# Patient Record
Sex: Male | Born: 1955 | Hispanic: Yes | Marital: Married | Smoking: Never smoker
Health system: Southern US, Community
[De-identification: ages and names within clinical notes are randomized; demographics above are authoritative.]

## PROBLEM LIST (undated history)

## (undated) DIAGNOSIS — I350 Nonrheumatic aortic (valve) stenosis: Secondary | ICD-10-CM

## (undated) DIAGNOSIS — E785 Hyperlipidemia, unspecified: Secondary | ICD-10-CM

## (undated) DIAGNOSIS — Z952 Presence of prosthetic heart valve: Secondary | ICD-10-CM

## (undated) DIAGNOSIS — B351 Tinea unguium: Secondary | ICD-10-CM

## (undated) DIAGNOSIS — I219 Acute myocardial infarction, unspecified: Secondary | ICD-10-CM

## (undated) DIAGNOSIS — M47816 Spondylosis without myelopathy or radiculopathy, lumbar region: Secondary | ICD-10-CM

## (undated) DIAGNOSIS — I6523 Occlusion and stenosis of bilateral carotid arteries: Secondary | ICD-10-CM

## (undated) DIAGNOSIS — M5412 Radiculopathy, cervical region: Secondary | ICD-10-CM

## (undated) DIAGNOSIS — I700 Atherosclerosis of aorta: Secondary | ICD-10-CM

## (undated) DIAGNOSIS — R7302 Impaired glucose tolerance (oral): Secondary | ICD-10-CM

## (undated) DIAGNOSIS — R519 Headache, unspecified: Secondary | ICD-10-CM

## (undated) DIAGNOSIS — M5416 Radiculopathy, lumbar region: Secondary | ICD-10-CM

## (undated) DIAGNOSIS — E669 Obesity, unspecified: Secondary | ICD-10-CM

## (undated) DIAGNOSIS — I10 Essential (primary) hypertension: Secondary | ICD-10-CM

## (undated) DIAGNOSIS — I2510 Atherosclerotic heart disease of native coronary artery without angina pectoris: Secondary | ICD-10-CM

## (undated) DIAGNOSIS — K219 Gastro-esophageal reflux disease without esophagitis: Secondary | ICD-10-CM

## (undated) DIAGNOSIS — I4510 Unspecified right bundle-branch block: Secondary | ICD-10-CM

## (undated) DIAGNOSIS — I509 Heart failure, unspecified: Secondary | ICD-10-CM

## (undated) DIAGNOSIS — Q2381 Bicuspid aortic valve: Secondary | ICD-10-CM

## (undated) DIAGNOSIS — Q230 Congenital stenosis of aortic valve: Secondary | ICD-10-CM

## (undated) HISTORY — DX: Obesity, unspecified: E66.9

## (undated) HISTORY — DX: Nonrheumatic aortic (valve) stenosis: I35.0

## (undated) HISTORY — DX: Essential (primary) hypertension: I10

## (undated) HISTORY — DX: Congenital stenosis of aortic valve: Q23.0

## (undated) HISTORY — DX: Bicuspid aortic valve: Q23.81

## (undated) HISTORY — DX: Impaired glucose tolerance (oral): R73.02

## (undated) HISTORY — DX: Headache, unspecified: R51.9

## (undated) HISTORY — DX: Atherosclerotic heart disease of native coronary artery without angina pectoris: I25.10

## (undated) HISTORY — DX: Hyperlipidemia, unspecified: E78.5

## (undated) HISTORY — PX: NO PAST SURGERIES: SHX2092

---

## 2006-06-19 ENCOUNTER — Inpatient Hospital Stay: Payer: Self-pay | Admitting: Internal Medicine

## 2006-06-19 ENCOUNTER — Other Ambulatory Visit: Payer: Self-pay

## 2006-06-20 ENCOUNTER — Other Ambulatory Visit: Payer: Self-pay

## 2006-06-21 ENCOUNTER — Ambulatory Visit: Payer: Self-pay | Admitting: Internal Medicine

## 2013-06-30 HISTORY — PX: CARDIAC CATHETERIZATION: SHX172

## 2013-07-01 ENCOUNTER — Inpatient Hospital Stay: Payer: Self-pay | Admitting: Internal Medicine

## 2013-07-01 LAB — BASIC METABOLIC PANEL
ANION GAP: 1 — AB (ref 7–16)
BUN: 12 mg/dL (ref 7–18)
CALCIUM: 8.6 mg/dL (ref 8.5–10.1)
CHLORIDE: 104 mmol/L (ref 98–107)
CREATININE: 0.94 mg/dL (ref 0.60–1.30)
Co2: 29 mmol/L (ref 21–32)
EGFR (African American): >60
GLUCOSE: 104 mg/dL — AB (ref 65–99)
Osmolality: 268 (ref 275–301)
POTASSIUM: 3.5 mmol/L (ref 3.5–5.1)
SODIUM: 134 mmol/L — AB (ref 136–145)

## 2013-07-01 LAB — TROPONIN I
Troponin-I: 0.96 ng/mL — ABNORMAL HIGH
Troponin-I: 1.7 ng/mL — ABNORMAL HIGH

## 2013-07-01 LAB — CBC
HCT: 47.8 % (ref 40.0–52.0)
HGB: 16.3 g/dL (ref 13.0–18.0)
MCH: 29.8 pg (ref 26.0–34.0)
MCHC: 34.1 g/dL (ref 32.0–36.0)
MCV: 87 fL (ref 80–100)
Platelet: 193 10*3/uL (ref 150–440)
RBC: 5.47 10*6/uL (ref 4.40–5.90)
RDW: 13.3 % (ref 11.5–14.5)
WBC: 8.4 10*3/uL (ref 3.8–10.6)

## 2013-07-01 LAB — CK-MB
CK-MB: 4.2 ng/mL — ABNORMAL HIGH (ref 0.5–3.6)
CK-MB: 4.6 ng/mL — ABNORMAL HIGH (ref 0.5–3.6)

## 2013-07-01 LAB — APTT: Activated PTT: 29.1 secs (ref 23.6–35.9)

## 2013-07-01 LAB — PROTIME-INR
INR: 0.9
PROTHROMBIN TIME: 12.4 s (ref 11.5–14.7)

## 2013-07-02 ENCOUNTER — Encounter: Payer: Self-pay | Admitting: Cardiovascular Disease

## 2013-07-02 ENCOUNTER — Other Ambulatory Visit: Payer: Self-pay | Admitting: Physician Assistant

## 2013-07-02 ENCOUNTER — Inpatient Hospital Stay (HOSPITAL_COMMUNITY)
Admission: AD | Admit: 2013-07-02 | Discharge: 2013-07-11 | DRG: 236 | Disposition: A | Discharge status: Home / Self Care | Payer: Medicaid Other | Source: Other Acute Inpatient Hospital | Attending: Cardiothoracic Surgery | Admitting: Cardiothoracic Surgery

## 2013-07-02 DIAGNOSIS — J988 Other specified respiratory disorders: Secondary | ICD-10-CM | POA: Diagnosis not present

## 2013-07-02 DIAGNOSIS — I498 Other specified cardiac arrhythmias: Secondary | ICD-10-CM | POA: Diagnosis not present

## 2013-07-02 DIAGNOSIS — I214 Non-ST elevation (NSTEMI) myocardial infarction: Secondary | ICD-10-CM

## 2013-07-02 DIAGNOSIS — Y849 Medical procedure, unspecified as the cause of abnormal reaction of the patient, or of later complication, without mention of misadventure at the time of the procedure: Secondary | ICD-10-CM | POA: Diagnosis not present

## 2013-07-02 DIAGNOSIS — J9819 Other pulmonary collapse: Secondary | ICD-10-CM | POA: Diagnosis not present

## 2013-07-02 DIAGNOSIS — I2510 Atherosclerotic heart disease of native coronary artery without angina pectoris: Secondary | ICD-10-CM | POA: Diagnosis present

## 2013-07-02 DIAGNOSIS — D62 Acute posthemorrhagic anemia: Secondary | ICD-10-CM | POA: Diagnosis not present

## 2013-07-02 DIAGNOSIS — I10 Essential (primary) hypertension: Secondary | ICD-10-CM | POA: Diagnosis present

## 2013-07-02 DIAGNOSIS — E8779 Other fluid overload: Secondary | ICD-10-CM | POA: Diagnosis not present

## 2013-07-02 DIAGNOSIS — Z951 Presence of aortocoronary bypass graft: Secondary | ICD-10-CM

## 2013-07-02 DIAGNOSIS — D689 Coagulation defect, unspecified: Secondary | ICD-10-CM | POA: Diagnosis not present

## 2013-07-02 DIAGNOSIS — I517 Cardiomegaly: Secondary | ICD-10-CM

## 2013-07-02 HISTORY — DX: Non-ST elevation (NSTEMI) myocardial infarction: I21.4

## 2013-07-02 LAB — LIPID PANEL
CHOLESTEROL: 215 mg/dL — AB (ref 0–200)
HDL: 38 mg/dL — AB (ref 40–60)
LDL CHOLESTEROL, CALC: 148 mg/dL — AB (ref 0–100)
TRIGLYCERIDES: 143 mg/dL (ref 0–200)
VLDL Cholesterol, Calc: 29 mg/dL (ref 5–40)

## 2013-07-02 LAB — HEMOGLOBIN A1C: Hemoglobin A1C: 6.1 % (ref 4.2–6.3)

## 2013-07-02 LAB — TROPONIN I: TROPONIN-I: 2.4 ng/mL — AB

## 2013-07-02 LAB — TSH: THYROID STIMULATING HORM: 3.44 u[IU]/mL

## 2013-07-02 LAB — APTT: Activated PTT: 74.5 secs — ABNORMAL HIGH (ref 23.6–35.9)

## 2013-07-02 LAB — MAGNESIUM: Magnesium: 2 mg/dL

## 2013-07-02 LAB — CK-MB: CK-MB: 5.2 ng/mL — AB (ref 0.5–3.6)

## 2013-07-02 MED ORDER — ASPIRIN EC 325 MG PO TBEC
325.0000 mg | DELAYED_RELEASE_TABLET | Freq: Every day | ORAL | Status: DC
  Administered 2013-07-03 – 2013-07-04 (×2): 325 mg via ORAL
  Filled 2013-07-02 (×4): qty 1

## 2013-07-02 MED ORDER — ONDANSETRON HCL 4 MG/2ML IJ SOLN: 4.0000 mg | Freq: Four times a day (QID) | INTRAMUSCULAR | Status: DC | PRN

## 2013-07-02 MED ORDER — ACETAMINOPHEN 325 MG PO TABS
650.0000 mg | ORAL_TABLET | ORAL | Status: DC | PRN
Start: 2013-07-02 — End: 2013-07-05
  Administered 2013-07-04 (×2): 650 mg via ORAL
  Filled 2013-07-02 (×2): qty 2

## 2013-07-02 MED ORDER — PANTOPRAZOLE SODIUM 40 MG PO TBEC
40.0000 mg | DELAYED_RELEASE_TABLET | Freq: Every day | ORAL | Status: DC
  Administered 2013-07-03 – 2013-07-05 (×3): 40 mg via ORAL
  Filled 2013-07-02 (×3): qty 1

## 2013-07-02 MED ORDER — MORPHINE SULFATE 2 MG/ML IJ SOLN: 2.0000 mg | INTRAMUSCULAR | Status: DC | PRN

## 2013-07-02 MED ORDER — NITROGLYCERIN 0.4 MG SL SUBL
0.4000 mg | SUBLINGUAL_TABLET | SUBLINGUAL | Status: DC | PRN
  Administered 2013-07-03 (×2): 0.4 mg via SUBLINGUAL
  Filled 2013-07-02: qty 25

## 2013-07-02 MED ORDER — METOPROLOL TARTRATE 25 MG PO TABS
25.0000 mg | ORAL_TABLET | Freq: Two times a day (BID) | ORAL | Status: DC
  Administered 2013-07-02 – 2013-07-04 (×5): 25 mg via ORAL
  Filled 2013-07-02 (×8): qty 1

## 2013-07-02 MED ORDER — HEPARIN BOLUS VIA INFUSION
4000.0000 [IU] | Freq: Once | INTRAVENOUS | Status: AC
  Administered 2013-07-02: 4000 [IU] via INTRAVENOUS
  Filled 2013-07-02: qty 4000

## 2013-07-02 MED ORDER — ZOLPIDEM TARTRATE 5 MG PO TABS: 5.0000 mg | ORAL_TABLET | Freq: Every evening | ORAL | Status: DC | PRN

## 2013-07-02 MED ORDER — LISINOPRIL 10 MG PO TABS
10.0000 mg | ORAL_TABLET | Freq: Every day | ORAL | Status: DC
  Administered 2013-07-03 – 2013-07-04 (×2): 10 mg via ORAL
  Filled 2013-07-02 (×4): qty 1

## 2013-07-02 MED ORDER — ALPRAZOLAM 0.25 MG PO TABS: 0.2500 mg | ORAL_TABLET | Freq: Two times a day (BID) | ORAL | Status: DC | PRN

## 2013-07-02 MED ORDER — HEPARIN (PORCINE) IN NACL 100-0.45 UNIT/ML-% IJ SOLN
1300.0000 [IU]/h | INTRAMUSCULAR | Status: DC
  Administered 2013-07-02: 1200 [IU]/h via INTRAVENOUS
  Administered 2013-07-03 – 2013-07-04 (×3): 1300 [IU]/h via INTRAVENOUS
  Filled 2013-07-02 (×5): qty 250

## 2013-07-02 MED ORDER — SODIUM CHLORIDE 0.9 % IJ SOLN: 3.0000 mL | INTRAMUSCULAR | Status: DC | PRN

## 2013-07-02 MED ORDER — SODIUM CHLORIDE 0.9 % IJ SOLN
3.0000 mL | Freq: Two times a day (BID) | INTRAMUSCULAR | Status: DC
Start: 2013-07-02 — End: 2013-07-05
  Administered 2013-07-03 – 2013-07-04 (×3): 3 mL via INTRAVENOUS

## 2013-07-02 MED ORDER — ATORVASTATIN CALCIUM 80 MG PO TABS
80.0000 mg | ORAL_TABLET | Freq: Every day | ORAL | Status: DC
  Administered 2013-07-03 – 2013-07-11 (×8): 80 mg via ORAL
  Filled 2013-07-02 (×9): qty 1

## 2013-07-02 MED ORDER — SODIUM CHLORIDE 0.9 % IV SOLN: 250.0000 mL | INTRAVENOUS | Status: DC | PRN

## 2013-07-02 NOTE — Progress Notes (Signed)
ANTICOAGULATION CONSULT NOTE - Initial Consult  Pharmacy Consult for Heparin  Indication: chest pain/ACS  No Known Allergies  Patient Measurements: Height: 5\' 6"  (167.6 cm) Weight: 199 lb 9.6 oz (90.538 kg) IBW/kg (Calculated) : 63.8 Heparin dosing weight = 90 kg  Vital Signs: Temp: 98.5 F (36.9 C) (02/03 1842) Temp src: Oral (02/03 1842) BP: 154/92 mmHg (02/03 1842) Pulse Rate: 85 (02/03 1842)  Labs: No results found for this basename: HGB, HCT, PLT, APTT, LABPROT, INR, HEPARINUNFRC, CREATININE, CKTOTAL, CKMB, TROPONINI,  in the last 72 hours  CrCl is unknown because no creatinine reading has been taken.   Medical History: No past medical history on file.  Medications:  Prescriptions prior to admission  Medication Sig Dispense Refill  . ibuprofen (ADVIL,MOTRIN) 100 MG tablet Take 200 mg by mouth every 6 (six) hours as needed for pain.         Assessment: 58 yo M admitted 07/02/2013  With several days of CP.  [facility] noted to have a positive troponin.  Pharmacy consulted to start heparin  Coag: ACS, patient denies recent bleeding or surgery.  Goal of Therapy:  Heparin level 0.3-0.7 units/ml Monitor platelets by anticoagulation protocol: Yes   Plan:  1. Heparin 4000 units IV x 1 then 1200 units/hr 2. Heparin level 6h after ggt starts 3. Daily heparin level and CBC   Thank you for allowing pharmacy to be a part of this patients care team.  Lovenia KimJulie Caydence Koenig Pharm.D., BCPS Clinical Pharmacist 07/02/2013 8:17 PM Pager: [phone] Phone: [phone]

## 2013-07-03 ENCOUNTER — Other Ambulatory Visit: Payer: Self-pay | Admitting: *Deleted

## 2013-07-03 ENCOUNTER — Inpatient Hospital Stay (HOSPITAL_COMMUNITY): Payer: Medicaid Other

## 2013-07-03 ENCOUNTER — Encounter (HOSPITAL_COMMUNITY): Payer: Self-pay | Admitting: *Deleted

## 2013-07-03 DIAGNOSIS — I2510 Atherosclerotic heart disease of native coronary artery without angina pectoris: Secondary | ICD-10-CM

## 2013-07-03 DIAGNOSIS — Z01810 Encounter for preprocedural cardiovascular examination: Secondary | ICD-10-CM

## 2013-07-03 LAB — BASIC METABOLIC PANEL
BUN: 12 mg/dL (ref 6–23)
CALCIUM: 8.8 mg/dL (ref 8.4–10.5)
CO2: 24 meq/L (ref 19–32)
Chloride: 100 mEq/L (ref 96–112)
Creatinine, Ser: 0.88 mg/dL (ref 0.50–1.35)
GFR calc non Af Amer: >90 mL/min (ref >90)
Glucose, Bld: 105 mg/dL — ABNORMAL HIGH (ref 70–99)
Potassium: 3.6 mEq/L — ABNORMAL LOW (ref 3.7–5.3)
Sodium: 139 mEq/L (ref 137–147)

## 2013-07-03 LAB — PULMONARY FUNCTION TEST
DL/VA % pred: 167 %
DL/VA: 7.32 ml/min/mmHg/L
DLCO cor % pred: 124 %
DLCO cor: 33.54 ml/min/mmHg
DLCO unc % pred: 131 %
DLCO unc: 35.37 ml/min/mmHg
FEF 25-75 Post: 2.37 L/sec
FEF 25-75 Pre: 2.01 L/sec
FEF2575-%Change-Post: 17 %
FEF2575-%Pred-Post: 86 %
FEF2575-%Pred-Pre: 72 %
FEV1-%Change-Post: 7 %
FEV1-%Pred-Post: 74 %
FEV1-%Pred-Pre: 69 %
FEV1-Post: 2.41 L
FEV1-Pre: 2.23 L
FEV1FVC-%Change-Post: 0 %
FEV1FVC-%Pred-Pre: 100 %
FEV6-%Change-Post: 7 %
FEV6-%Pred-Post: 74 %
FEV6-%Pred-Pre: 69 %
FEV6-Post: 3 L
FEV6-Pre: 2.79 L
FEV6FVC-%Change-Post: 0 %
FEV6FVC-%Pred-Post: 105 %
FEV6FVC-%Pred-Pre: 104 %
FVC-%Change-Post: 6 %
FVC-%Pred-Post: 74 %
FVC-%Pred-Pre: 69 %
FVC-Post: 3.12 L
FVC-Pre: 2.92 L
Post FEV1/FVC ratio: 77 %
Post FEV6/FVC ratio: 100 %
Pre FEV1/FVC ratio: 76 %
Pre FEV6/FVC Ratio: 100 %
RV % pred: 73 %
RV: 1.44 L
TLC % pred: 76 %
TLC: 4.73 L

## 2013-07-03 LAB — HEPARIN LEVEL (UNFRACTIONATED)
Heparin Unfractionated: 0.39 IU/mL (ref 0.30–0.70)
Heparin Unfractionated: 0.28 IU/mL — ABNORMAL LOW (ref 0.30–0.70)
Heparin Unfractionated: 0.38 IU/mL (ref 0.30–0.70)

## 2013-07-03 LAB — URINALYSIS, ROUTINE W REFLEX MICROSCOPIC
Bilirubin Urine: NEGATIVE
Glucose, UA: NEGATIVE mg/dL
Hgb urine dipstick: NEGATIVE
Ketones, ur: NEGATIVE mg/dL
Leukocytes, UA: NEGATIVE
Nitrite: NEGATIVE
Protein, ur: NEGATIVE mg/dL
Specific Gravity, Urine: 1.021 (ref 1.005–1.030)
Urobilinogen, UA: 0.2 mg/dL (ref 0.0–1.0)
pH: 6 (ref 5.0–8.0)

## 2013-07-03 LAB — CBC
HEMATOCRIT: 47.1 % (ref 39.0–52.0)
Hemoglobin: 16.7 g/dL (ref 13.0–17.0)
MCH: 30.6 pg (ref 26.0–34.0)
MCHC: 35.5 g/dL (ref 30.0–36.0)
MCV: 86.4 fL (ref 78.0–100.0)
Platelets: 199 10*3/uL (ref 150–400)
RBC: 5.45 MIL/uL (ref 4.22–5.81)
RDW: 13.4 % (ref 11.5–15.5)
WBC: 8.9 10*3/uL (ref 4.0–10.5)

## 2013-07-03 LAB — SURGICAL PCR SCREEN
MRSA, PCR: NEGATIVE
Staphylococcus aureus: NEGATIVE

## 2013-07-03 LAB — APTT: aPTT: 58 seconds — ABNORMAL HIGH (ref 24–37)

## 2013-07-03 LAB — PLATELET INHIBITION P2Y12
PLATELET FUNCTION P2Y12: UNDETERMINED [PRU] (ref 194–418)
PLATELET FUNCTION P2Y12: 143 [PRU] — AB (ref 194–418)

## 2013-07-03 MED ORDER — ALBUTEROL SULFATE (2.5 MG/3ML) 0.083% IN NEBU
2.5000 mg | INHALATION_SOLUTION | Freq: Once | RESPIRATORY_TRACT | Status: AC
  Administered 2013-07-03: 2.5 mg via RESPIRATORY_TRACT

## 2013-07-03 NOTE — Progress Notes (Signed)
Utilization Review Completed.James Yates T2/08/2013  

## 2013-07-03 NOTE — Progress Notes (Signed)
Pre-op Cardiac Surgery  Carotid Findings:  Bilateral:  1-39% ICA stenosis.  Vertebral artery flow is antegrade.      Upper Extremity Right Left  Brachial Pressures 129 117  Radial Waveforms Bi Tri  Ulnar Waveforms Bi Tri  Palmar Arch (Allen's Test) Decreases with radial compression, but not >50%. Normal with ulnar compression Normal    Farrel DemarkJill Eunice, RDMS, RVT 07/03/2013

## 2013-07-03 NOTE — Consult Note (Signed)
[address]       [place] [address]             [phone]        James Yates [facility] Medical Record #[id number] Date of Birth: [birth year]  Referring: Kirke Corin Primary Care: No PCP Per Patient  Chief Complaint: NSTEMI, 3V CAD   History of Present Illness:      James Yates is a 58 yo Hispanic male who primarily speaks Spanish.  He presented to [facility] with complaints of intermittent dull chest pain that has been occurring over the past month.  However, over the past week the patient noticed the pain had become exertional with radiation into his back and neck with increasing severity.  He denies associated dyspnea, diaphoresis, and nausea.  Workup in the ED revealed no significant EKG changes and an elevated Troponin.  He was ruled in for NSTEMI, started on a Heparin drip, administered ASA and 300 mg Plavix.  Cardiology admitted patient chest pain free.  He underwent Cardiac catheterization which revealed 3 vessel CAD with no LM involvement as well as a preserved EF of 60%.  Echocardiogram was also obtained which did not reveal evidence of significant valvular disease.  It was felt patient wound benefit from Coronary Bypass procedure, resulting in transfer to [facility] for [facility] care.  The patient is currently chest pain free.  He remains on a Heparin drip.    Current Activity/ Functional Status: Patient is independent with mobility/ambulation, transfers, ADL's, IADL's.   Zubrod Score: At the time of surgery this patient's most appropriate activity status/level should be described as: [x]     0    Normal activity, no symptoms []     1    Restricted in physical strenuous activity but ambulatory, able to do out light work []     2    Ambulatory and capable of self care, unable to do work activities, up and about                 more than 50%  Of the time                            []     3    Only limited self care, in bed greater than 50% of waking  hours []     4    Completely disabled, no self care, confined to bed or chair []     5    Moribund  Past Medical History  Diagnosis Date  . Medical history non-contributory     Past Surgical History  Procedure Laterality Date  . No past surgeries      History  Smoking status  . Never Smoker   Smokeless tobacco  . Not on file    History  Alcohol Use No    History   Social History  . Marital Status: Married    Spouse Name: N/A    Number of Children: N/A  . Years of Education: N/A   Occupational History  . Not on file.   Social History Main Topics  . Smoking status: Never Smoker   . Smokeless tobacco: Not on file  . Alcohol Use: No  . Drug Use: No  . Sexual Activity: Not on file   Other Topics Concern  . Not on file   Social History Narrative  . No narrative on file    No Known Allergies  Current Facility-Administered Medications  Medication Dose Route Frequency Provider Last Rate Last Dose  . 0.9 %  sodium chloride infusion  250 mL Intravenous PRN Roger A Arguello, PA-C      . acetaminophen (TYLENOL) tablet 650 mg  650 mg Oral Q4H PRN Roger A Arguello, PA-C      . ALPRAZolam Prudy Feeler) tablet 0.25 mg  0.25 mg Oral BID PRN Gery Pray, PA-C      . aspirin EC tablet 325 mg  325 mg Oral Daily Roger A Arguello, PA-C   325 mg at 07/03/13 1022  . atorvastatin (LIPITOR) tablet 80 mg  80 mg Oral q1800 Roger A Arguello, PA-C      . heparin ADULT infusion 100 units/mL (25000 units/250 mL)  1,200 Units/hr Intravenous Continuous Lewayne Bunting, MD 12 mL/hr at 07/02/13 2150 1,200 Units/hr at 07/02/13 2150  . lisinopril (PRINIVIL,ZESTRIL) tablet 10 mg  10 mg Oral Daily Roger A Arguello, PA-C   10 mg at 07/03/13 1022  . metoprolol tartrate (LOPRESSOR) tablet 25 mg  25 mg Oral BID Roger A Arguello, PA-C   25 mg at 07/03/13 1022  . morphine 2 MG/ML injection 2 mg  2 mg Intravenous Q4H PRN Roger A Arguello, PA-C      . nitroGLYCERIN (NITROSTAT) SL tablet 0.4 mg  0.4 mg  Sublingual Q5 Min x 3 PRN Roger A Arguello, PA-C      . ondansetron (ZOFRAN) injection 4 mg  4 mg Intravenous Q6H PRN Roger A Arguello, PA-C      . pantoprazole (PROTONIX) EC tablet 40 mg  40 mg Oral Q0600 Roger A Arguello, PA-C   40 mg at 07/03/13 0550  . sodium chloride 0.9 % injection 3 mL  3 mL Intravenous Q12H Roger A Arguello, PA-C      . sodium chloride 0.9 % injection 3 mL  3 mL Intravenous PRN Roger A Arguello, PA-C      . zolpidem (AMBIEN) tablet 5 mg  5 mg Oral QHS PRN,MR X 1 Roger A Arguello, PA-C        Prescriptions prior to admission  Medication Sig Dispense Refill  . ibuprofen (ADVIL,MOTRIN) 100 MG tablet Take 200 mg by mouth every 6 (six) hours as needed for pain.         No family history on file.   Review of Systems:     Cardiac Review of Systems: Y or N  Chest Pain [ Y, on presentation, currently resolved   ]  Resting SOB [   ] Exertional SOB  [  ]  Orthopnea [  ]   Pedal Edema [   ]    Palpitations [  ] Syncope  [  ]   Presyncope [   ]  General Review of Systems: [Y] = yes [  ]=no Constitional: recent weight change Klaus.Mock  ]; anorexia [  ]; fatigue [  ]; nausea [ N ]; night sweats [  ]; fever [  ]; or chills [  ]                                                               Dental: poor dentition[  ]; Last Dentist visit:   Eye : blurred vision [  ]; diplopia [ N  ];  vision changes [  ];  Amaurosis fugax[  ]; Resp: cough [Y  ];  wheezing[  ];  hemoptysis[ N ]; shortness of breath[  ]; paroxysmal nocturnal dyspnea[  ]; dyspnea on exertion[  ]; or orthopnea[  ];  GI:  gallstones[  ], vomiting[ N ];  dysphagia[  ]; melena[  ];  hematochezia [  ]; heartburn[  ];   Hx of  Colonoscopy[  ]; GU: kidney stones [  ]; hematuria[  ];   dysuria [  ];  nocturia[  ];  history of     obstruction [  ]; urinary frequency [  ]             Skin: rash, swelling[ N ];, hair loss[  ];  peripheral edema[  ];  or itching[  ]; Musculosketetal: myalgias[  ];  joint swelling[  ];  joint erythema[   ];  joint pain[  ];  back pain[  ];  Heme/Lymph: bruising[  ];  bleeding[  ];  anemia[  ];  Neuro: TIA[ N ];  headaches[  ];  stroke[  ];  vertigo[  ];  seizures[  ];   paresthesias[ n ];  difficulty walking[  ];  Psych:depression[  ]; anxiety[  ];  Endocrine: diabetes[ N ];  thyroid dysfunction[  ];  Immunizations: Flu [  ]; Pneumococcal[  ];  Other:  Physical Exam: BP 134/87  Pulse 81  Temp(Src) 98.8 F (37.1 C) (Oral)  Resp 20  Ht 5\' 6"  (1.676 m)  Wt 206 lb (93.441 kg)  BMI 33.27 kg/m2  SpO2 99%  General appearance: alert, cooperative and no distress Neurologic: intact Heart: regular rate and rhythm Lungs: clear to auscultation bilaterally Abdomen: soft, non-tender; bowel sounds normal; no masses,  no organomegaly Extremities: extremities normal, atraumatic, no cyanosis or edema  Diagnostic Studies & Laboratory data:     Recent Radiology Findings:    Recent Lab Findings: Lab Results  Component Value Date   WBC 8.9 07/03/2013   HGB 16.7 07/03/2013   HCT 47.1 07/03/2013   PLT 199 07/03/2013   GLUCOSE 105* 07/03/2013   NA 139 07/03/2013   K 3.6* 07/03/2013   CL 100 07/03/2013   CREATININE 0.88 07/03/2013   BUN 12 07/03/2013   CO2 24 07/03/2013   Assessment / Plan:    1. Coronary Artery Disease- CABG consult requested 2. Recent NSTEMI- on Beta Blocker, ASA, ACE, and Statin 3. Dispo- Dr. Donata ClayVan Trigt is aware of patient.  He did receive Plavix, which will affect timing of surgery.  He will evaluate patient and discuss timing of procedure.   07/03/2013 12:07 PM    Coronary angiogram, 2-D echocardiogram reviewed in patient examined. Plan multivessel CABG for severe multivessel CAD and unstable angina when Plavix effect resolves by antiplatelet assay P2 Y. 12. Surgery tentatively scheduled for February 6.

## 2013-07-03 NOTE — Progress Notes (Signed)
ANTICOAGULATION CONSULT NOTE - Follow Up Consult  Pharmacy Consult for heparin Indication: chest pain/ACS  Labs:  Recent Labs  07/03/13 0300  HGB 16.7  HCT 47.1  PLT 199  HEPARINUNFRC 0.39  CREATININE 0.88    Assessment/Plan:  58yo male therapeutic on heparin with initial dosing for CP.  Will continue gtt at current rate and confirm stable with additional level.  Vernard GamblesVeronda Sherita Decoste, PharmD, BCPS  07/03/2013,6:06 AM

## 2013-07-03 NOTE — Plan of Care (Signed)
Problem: Phase I - Pre-Op Goal: Point person for discharge identified Outcome: Completed/Met Date Met:  07/03/13 Wife and daughter

## 2013-07-03 NOTE — Progress Notes (Signed)
ANTICOAGULATION CONSULT NOTE - Follow Up Consult  Pharmacy Consult for heparin Indication: CAD awaiting CABG  No Known Allergies  Patient Measurements: Height: 5\' 6"  (167.6 cm) Weight: 206 lb (93.441 kg) IBW/kg (Calculated) : 63.8 Heparin Dosing Weight: 90 kg  Vital Signs: Temp: 98.8 F (37.1 C) (02/04 0434) Temp src: Oral (02/04 0434) BP: 134/87 mmHg (02/04 0434) Pulse Rate: 81 (02/04 0434)  Labs:  Recent Labs  07/03/13 0300 07/03/13 1140  HGB 16.7  --   HCT 47.1  --   PLT 199  --   HEPARINUNFRC 0.39 0.28*  CREATININE 0.88  --     Estimated Creatinine Clearance: 99 ml/min (by C-G formula based on Cr of 0.88).  Assessment: Patient is a 58 y.o M on heparin for CAD with plan for CABG.  Repeat heparin level now back slightly below goal at 0.28.  No bleeding documented.  Goal of Therapy:  Heparin level 0.3-0.7 units/ml Monitor platelets by anticoagulation protocol: Yes   Plan:  1) increase heparin drip to 1300 units/hr 2) recheck another 6 hour heparin level  Ledon Weihe P 07/03/2013,12:51 PM

## 2013-07-03 NOTE — Progress Notes (Signed)
  SUBJECTIVE:  No chest pain or SOB currently  OBJECTIVE:   Vitals:   Filed Vitals:   07/02/13 2128 07/02/13 2339 07/03/13 0434 07/03/13 1025  BP: 157/90 122/82 134/87   Pulse: 78 78 81   Temp: 98.2 F (36.8 C)  98.8 F (37.1 C)   TempSrc: Oral  Oral   Resp: 18  20   Height:    5\' 6"  (1.676 m)  Weight:   200 lb 9.6 oz (90.992 kg) 206 lb (93.441 kg)  SpO2: 99%  99%    I&O's:   Intake/Output Summary (Last 24 hours) at 07/03/13 1159 Last data filed at 07/03/13 0800  Gross per 24 hour  Intake    264 ml  Output      0 ml  Net    264 ml   TELEMETRY: Reviewed telemetry pt in NSR:     PHYSICAL EXAM General: Well developed, well nourished, in no acute distress Head: Eyes PERRLA, No xanthomas.   Normal cephalic and atramatic  Lungs:   Clear bilaterally to auscultation and percussion. Heart:   HRRR S1 S2 Pulses are 2+ & equal.            No carotid bruit. No JVD.  No abdominal bruits. No femoral bruits. Abdomen: Bowel sounds are positive, abdomen soft and non-tender without masses  Extremities:   No clubbing, cyanosis or edema.  DP +1 Neuro: Alert and oriented X 3. Psych:  Good affect, responds appropriately   LABS: Basic Metabolic Panel:  Recent Labs  16/02/9601/04/15 0300  NA 139  K 3.6*  CL 100  CO2 24  GLUCOSE 105*  BUN 12  CREATININE 0.88  CALCIUM 8.8   Liver Function Tests: No results found for this basename: AST, ALT, ALKPHOS, BILITOT, PROT, ALBUMIN,  in the last 72 hours No results found for this basename: LIPASE, AMYLASE,  in the last 72 hours CBC:  Recent Labs  07/03/13 0300  WBC 8.9  HGB 16.7  HCT 47.1  MCV 86.4  PLT 199     RADIOLOGY: No results found.    ASSESSMENT:  1.  NSTEMI 2.  Severe 3 vessel ASCAD 3.  Normal LVF by echo   PLAN:   1.  Continue ASA/IV Heparin/beta blocker/statin/ACE I 2.  He did receive Plavix at [facility] x 2 dose on 2/2 in the evening 3.  CVTS consult for CABG  Quintella ReichertURNER,TRACI R, MD  07/03/2013  11:59 AM

## 2013-07-03 NOTE — Progress Notes (Signed)
ANTICOAGULATION CONSULT NOTE - Follow Up Consult  Pharmacy Consult for Heparin Indication: CAD awaiting CABG   No Known Allergies  Patient Measurements: Height: 5\' 6"  (167.6 cm) Weight: 206 lb (93.441 kg) IBW/kg (Calculated) : 63.8 Heparin Dosing Weight: 90kg  Vital Signs: Temp: 99 F (37.2 C) (02/04 2100) Temp src: Oral (02/04 2100) BP: 109/69 mmHg (02/04 2100) Pulse Rate: 89 (02/04 2100)  Labs:  Recent Labs  07/03/13 0300 07/03/13 1140 07/03/13 1310 07/03/13 1859  HGB 16.7  --   --   --   HCT 47.1  --   --   --   PLT 199  --   --   --   APTT  --   --  58*  --   HEPARINUNFRC 0.39 0.28*  --  0.38  CREATININE 0.88  --   --   --     Estimated Creatinine Clearance: 99 ml/min (by C-G formula based on Cr of 0.88).   Medications:  Heparin 1300 units/hr  Assessment: 58yom continuing on heparin for CAD while awaiting CABG. Heparin level (0.38) is now therapeutic after rate increase - will continue current rate and follow-up AM level. - No significant bleeding reproted  Goal of Therapy:  Heparin level 0.3-0.7 units/ml Monitor platelets by anticoagulation protocol: Yes   Plan:  1. Continue heparin drip 1300 units/hr (13 ml/hr) 2. Follow-up AM heparin level and CBC  James Yates, James Yates [phone] 07/03/2013,9:22 PM

## 2013-07-03 NOTE — Progress Notes (Signed)
Pt complaining of chest pressure 3 out of 10. RN gave pt sublingual nitroglycerin X's 2 and pt now states pain is at a level 0. NT got updated vitals and EKG, vitals are WDL. Will continue to monitor.   Genevive Biavis, Zaliyah Meikle R, RN

## 2013-07-04 DIAGNOSIS — I2510 Atherosclerotic heart disease of native coronary artery without angina pectoris: Secondary | ICD-10-CM

## 2013-07-04 LAB — HEMOGLOBIN A1C
Hgb A1c MFr Bld: 6.4 % — ABNORMAL HIGH (ref <5.7)
Mean Plasma Glucose: 137 mg/dL — ABNORMAL HIGH (ref <117)

## 2013-07-04 LAB — HEPATIC FUNCTION PANEL
ALT: 17 U/L (ref 0–53)
AST: 16 U/L (ref 0–37)
Albumin: 3.9 g/dL (ref 3.5–5.2)
Alkaline Phosphatase: 78 U/L (ref 39–117)
Bilirubin, Direct: <0.2 mg/dL (ref 0.0–0.3)
Total Bilirubin: 0.7 mg/dL (ref 0.3–1.2)
Total Protein: 7.5 g/dL (ref 6.0–8.3)

## 2013-07-04 LAB — HEPARIN LEVEL (UNFRACTIONATED): Heparin Unfractionated: 0.42 IU/mL (ref 0.30–0.70)

## 2013-07-04 LAB — CBC
HCT: 46.7 % (ref 39.0–52.0)
HEMOGLOBIN: 16.4 g/dL (ref 13.0–17.0)
MCH: 30.5 pg (ref 26.0–34.0)
MCHC: 35.1 g/dL (ref 30.0–36.0)
MCV: 87 fL (ref 78.0–100.0)
Platelets: 198 10*3/uL (ref 150–400)
RBC: 5.37 MIL/uL (ref 4.22–5.81)
RDW: 13.5 % (ref 11.5–15.5)
WBC: 8.1 10*3/uL (ref 4.0–10.5)

## 2013-07-04 LAB — PLATELET INHIBITION P2Y12: Platelet Function  P2Y12: 183 [PRU] — ABNORMAL LOW (ref 194–418)

## 2013-07-04 LAB — PROTIME-INR
INR: 0.94 (ref 0.00–1.49)
Prothrombin Time: 12.4 seconds (ref 11.6–15.2)

## 2013-07-04 LAB — TSH: TSH: 5.255 u[IU]/mL — ABNORMAL HIGH (ref 0.350–4.500)

## 2013-07-04 MED ORDER — PLASMA-LYTE 148 IV SOLN
INTRAVENOUS | Status: AC
  Administered 2013-07-05: 09:00:00
  Filled 2013-07-04: qty 2.5

## 2013-07-04 MED ORDER — VANCOMYCIN HCL 10 G IV SOLR
1250.0000 mg | INTRAVENOUS | Status: AC
  Administered 2013-07-05: 1250 mg via INTRAVENOUS
  Filled 2013-07-04: qty 1250

## 2013-07-04 MED ORDER — EPINEPHRINE HCL 1 MG/ML IJ SOLN
0.5000 ug/min | INTRAVENOUS | Status: DC
  Filled 2013-07-04: qty 4

## 2013-07-04 MED ORDER — NITROGLYCERIN IN D5W 200-5 MCG/ML-% IV SOLN
2.0000 ug/min | INTRAVENOUS | Status: DC
  Filled 2013-07-04: qty 250

## 2013-07-04 MED ORDER — DEXMEDETOMIDINE HCL IN NACL 400 MCG/100ML IV SOLN
0.1000 ug/kg/h | INTRAVENOUS | Status: AC
  Administered 2013-07-05: 0.2 ug/kg/h via INTRAVENOUS
  Filled 2013-07-04: qty 100

## 2013-07-04 MED ORDER — CEFUROXIME SODIUM 750 MG IJ SOLR
750.0000 mg | INTRAMUSCULAR | Status: DC
Start: 2013-07-05 — End: 2013-07-05
  Filled 2013-07-04: qty 750

## 2013-07-04 MED ORDER — MAGNESIUM SULFATE 50 % IJ SOLN
40.0000 meq | INTRAMUSCULAR | Status: DC
  Filled 2013-07-04: qty 10

## 2013-07-04 MED ORDER — DOPAMINE-DEXTROSE 3.2-5 MG/ML-% IV SOLN
2.0000 ug/kg/min | INTRAVENOUS | Status: DC
  Filled 2013-07-04: qty 250

## 2013-07-04 MED ORDER — POTASSIUM CHLORIDE 2 MEQ/ML IV SOLN
80.0000 meq | INTRAVENOUS | Status: DC
Start: 2013-07-05 — End: 2013-07-05
  Filled 2013-07-04: qty 40

## 2013-07-04 MED ORDER — SODIUM CHLORIDE 0.9 % IV SOLN
INTRAVENOUS | Status: AC
  Administered 2013-07-05: 69.8 mL/h via INTRAVENOUS
  Filled 2013-07-04: qty 40

## 2013-07-04 MED ORDER — SODIUM CHLORIDE 0.9 % IV SOLN
INTRAVENOUS | Status: AC
  Administered 2013-07-05: 1.5 [IU]/h via INTRAVENOUS
  Filled 2013-07-04: qty 1

## 2013-07-04 MED ORDER — CEFUROXIME SODIUM 1.5 G IJ SOLR
1.5000 g | INTRAMUSCULAR | Status: AC
  Administered 2013-07-05: .75 g via INTRAVENOUS
  Administered 2013-07-05: 1.5 g via INTRAVENOUS
  Filled 2013-07-04: qty 1.5

## 2013-07-04 MED ORDER — PHENYLEPHRINE HCL 10 MG/ML IJ SOLN
30.0000 ug/min | INTRAVENOUS | Status: AC
  Administered 2013-07-05: 50 ug/min via INTRAVENOUS
  Filled 2013-07-04: qty 2

## 2013-07-04 MED ORDER — SODIUM CHLORIDE 0.9 % IV SOLN
INTRAVENOUS | Status: DC
  Filled 2013-07-04: qty 30

## 2013-07-04 NOTE — Care Management Note (Unsigned)
    Page 1 of 1   07/04/2013     3:45:52 PM   CARE MANAGEMENT NOTE 07/04/2013  Patient:  James Yates,James Yates   Account Number:  [id number]  Date Initiated:  07/04/2013  Documentation initiated by:  Taj Nevins  Subjective/Objective Assessment:   PT ADM ON 07/02/13 WITH CHEST PAIN, 3 VESSEL CAD.  PTA, PT INDEPENDENT, LIVES WITH FAMILY.     Action/Plan:   PT FOR CABG 07/05/13.  WILL FOLLOW FOR DC NEEDS AS PT PROGRESSES.   Anticipated DC Date:  07/10/2013   Anticipated DC Plan:  HOME W HOME HEALTH SERVICES      DC Planning Services  CM consult      Choice offered to / List presented to:             Status of service:  In process, will continue to follow Medicare Important Message given?   (If response is "NO", the following Medicare IM given date fields will be blank) Date Medicare IM given:   Date Additional Medicare IM given:    Discharge Disposition:    Per UR Regulation:  Reviewed for med. necessity/level of care/duration of stay  If discussed at Long Length of Stay Meetings, dates discussed:    Comments:

## 2013-07-04 NOTE — Progress Notes (Signed)
SUBJECTIVE:  Had some CP yesterday evening.  Currently pain free  OBJECTIVE:   Vitals:   Filed Vitals:   07/03/13 1408 07/03/13 2100 07/04/13 0500 07/04/13 0607  BP: 149/95 109/69  142/88  Pulse: 81 89  76  Temp: 98.6 F (37 C) 99 F (37.2 C)  98.9 F (37.2 C)  TempSrc: Oral Oral  Oral  Resp: 18 18  18   Height:      Weight:   199 lb 9.6 oz (90.538 kg)   SpO2: 94% 94%  97%   I&O's:   Intake/Output Summary (Last 24 hours) at 07/04/13 0981 Last data filed at 07/03/13 1600  Gross per 24 hour  Intake    351 ml  Output      0 ml  Net    351 ml   TELEMETRY: Reviewed telemetry pt in NSR:     PHYSICAL EXAM General: Well developed, well nourished, in no acute distress Head: Eyes PERRLA, No xanthomas.   Normal cephalic and atramatic  Lungs:   Clear bilaterally to auscultation and percussion. Heart:   HRRR S1 S2 Pulses are 2+ & equal. Abdomen: Bowel sounds are positive, abdomen soft and non-tender without masses  Extremities:   No clubbing, cyanosis or edema.  DP +1 Neuro: Alert and oriented X 3. Psych:  Good affect, responds appropriately   LABS: Basic Metabolic Panel:  Recent Labs  19/14/78 0300  NA 139  K 3.6*  CL 100  CO2 24  GLUCOSE 105*  BUN 12  CREATININE 0.88  CALCIUM 8.8   Liver Function Tests: No results found for this basename: AST, ALT, ALKPHOS, BILITOT, PROT, ALBUMIN,  in the last 72 hours No results found for this basename: LIPASE, AMYLASE,  in the last 72 hours CBC:  Recent Labs  07/03/13 0300 07/04/13 0600  WBC 8.9 8.1  HGB 16.7 16.4  HCT 47.1 46.7  MCV 86.4 87.0  PLT 199 198   Cardiac Enzymes: No results found for this basename: CKTOTAL, CKMB, CKMBINDEX, TROPONINI,  in the last 72 hours BNP: No components found with this basename: POCBNP,  D-Dimer: No results found for this basename: DDIMER,  in the last 72 hours Hemoglobin A1C: No results found for this basename: HGBA1C,  in the last 72 hours Fasting Lipid Panel: No results  found for this basename: CHOL, HDL, LDLCALC, TRIG, CHOLHDL, LDLDIRECT,  in the last 72 hours Thyroid Function Tests: No results found for this basename: TSH, T4TOTAL, FREET3, T3FREE, THYROIDAB,  in the last 72 hours Anemia Panel: No results found for this basename: VITAMINB12, FOLATE, FERRITIN, TIBC, IRON, RETICCTPCT,  in the last 72 hours Coag Panel:   Lab Results  Component Value Date   INR 0.94 07/04/2013    RADIOLOGY: Dg Chest 2 View  07/03/2013   CLINICAL DATA:  Coronary artery disease.  Preop CABG.  EXAM: CHEST  2 VIEW  COMPARISON:  DG CHEST 2V dated 07/01/2013  FINDINGS: Mediastinum and hilar structures normal. Borderline cardiomegaly, no congestive heart failure. Poor inspiration with mild basilar atelectasis. No significant pleural effusion or pneumothorax. Minimal pleural thickening noted bilaterally most consistent with scarring. No acute bony abnormality. Degenerative changes thoracic spine. Minimal stable wedging of lower thoracic vertebral bodies.  IMPRESSION: 1. Poor inspiration with mild bibasilar atelectasis. 2. Borderline cardiomegaly, no CHF.   Electronically Signed   By: Maisie Fus  Register   On: 07/03/2013 13:48   ASSESSMENT:  1. NSTEMI  2. Severe 3 vessel ASCAD  3. Normal LVF by echo  PLAN:  1. Continue ASA/IV Heparin/beta blocker/statin/ACE I  2. He did [facility] x 2 dose on 2/2 in the evening  3. CABG tentatively planned for tomorrow pending results of P2Y12 assay      Quintella ReichertURNER,James Platten R, MD  07/04/2013  7:14 AM

## 2013-07-04 NOTE — Progress Notes (Signed)
ANTICOAGULATION CONSULT NOTE - Follow Up Consult  Pharmacy Consult for Heparin Indication: CAD awaiting CABG   No Known Allergies  Patient Measurements: Height: 5\' 6"  (167.6 cm) Weight: 199 lb 9.6 oz (90.538 kg) IBW/kg (Calculated) : 63.8 Heparin Dosing Weight: 90kg  Vital Signs: Temp: 98.9 F (37.2 C) (02/05 0607) Temp src: Oral (02/05 0607) BP: 142/88 mmHg (02/05 0607) Pulse Rate: 76 (02/05 0607)  Labs:  Recent Labs  07/03/13 0300 07/03/13 1140 07/03/13 1310 07/03/13 1859 07/04/13 0600  HGB 16.7  --   --   --  16.4  HCT 47.1  --   --   --  46.7  PLT 199  --   --   --  198  APTT  --   --  58*  --   --   LABPROT  --   --   --   --  12.4  INR  --   --   --   --  0.94  HEPARINUNFRC 0.39 0.28*  --  0.38 0.42  CREATININE 0.88  --   --   --   --     Estimated Creatinine Clearance: 97.6 ml/min (by C-G formula based on Cr of 0.88).   Medications:  Heparin 1300 units/hr  Assessment: 58yom continuing on heparin for CAD while awaiting CABG. Heparin level (0.42) is therapeutic on 1300 units/hr.    Goal of Therapy:  Heparin level 0.3-0.7 units/ml Monitor platelets by anticoagulation protocol: Yes   Plan:  1. Continue heparin drip 1300 units/hr (13 ml/hr) 2. Follow-up AM heparin level and CBC 3. Follow-up timing of CABG  Toys 'R' UsKimberly Elouise Yates, Pharm.D., BCPS Clinical Pharmacist Pager [phone] 07/04/2013 12:45 PM

## 2013-07-04 NOTE — Progress Notes (Signed)
CARDIAC REHAB PHASE I   PRE:  Rate/Rhythm: 100 ST    BP: sitting 130/90    SaO2:   MODE:  Ambulation: 500 ft   POST:  Rate/Rhythm: 107 ST    BP: sitting 144/90     SaO2:   Ed completed through [place], Nurse, learning disabilitytranslator. Good understanding. Pt wanted to walk. Slow, steady. No c/o, denied angina. BP elevated. Took meds 30 min prior. To watch video today (in [place]). Gave materials (in [place]). [id number]  James LovettReeve, My [place] NewarkK[place]an CES, ACSM 07/04/2013 11:18 AM

## 2013-07-05 ENCOUNTER — Encounter (HOSPITAL_COMMUNITY): Payer: Medicaid Other | Admitting: Certified Registered Nurse Anesthetist

## 2013-07-05 ENCOUNTER — Encounter (HOSPITAL_COMMUNITY)
Admission: AD | Disposition: A | Discharge status: Home / Self Care | Payer: Medicaid Other | Source: Other Acute Inpatient Hospital | Attending: Cardiothoracic Surgery

## 2013-07-05 ENCOUNTER — Inpatient Hospital Stay (HOSPITAL_COMMUNITY): Payer: Medicaid Other

## 2013-07-05 ENCOUNTER — Inpatient Hospital Stay (HOSPITAL_COMMUNITY): Payer: Medicaid Other | Admitting: Certified Registered Nurse Anesthetist

## 2013-07-05 ENCOUNTER — Encounter (HOSPITAL_COMMUNITY): Payer: Self-pay | Admitting: Certified Registered Nurse Anesthetist

## 2013-07-05 DIAGNOSIS — I2510 Atherosclerotic heart disease of native coronary artery without angina pectoris: Secondary | ICD-10-CM

## 2013-07-05 DIAGNOSIS — I214 Non-ST elevation (NSTEMI) myocardial infarction: Secondary | ICD-10-CM

## 2013-07-05 DIAGNOSIS — Z951 Presence of aortocoronary bypass graft: Secondary | ICD-10-CM

## 2013-07-05 HISTORY — PX: CORONARY ARTERY BYPASS GRAFT: SHX141

## 2013-07-05 HISTORY — PX: INTRAOPERATIVE TRANSESOPHAGEAL ECHOCARDIOGRAM: SHX5062

## 2013-07-05 HISTORY — DX: Presence of aortocoronary bypass graft: Z95.1

## 2013-07-05 LAB — BASIC METABOLIC PANEL
BUN: 11 mg/dL (ref 6–23)
CALCIUM: 8.9 mg/dL (ref 8.4–10.5)
CO2: 26 mEq/L (ref 19–32)
Chloride: 102 mEq/L (ref 96–112)
Creatinine, Ser: 0.83 mg/dL (ref 0.50–1.35)
GFR calc Af Amer: >90 mL/min (ref >90)
GLUCOSE: 95 mg/dL (ref 70–99)
POTASSIUM: 3.7 meq/L (ref 3.7–5.3)
Sodium: 142 mEq/L (ref 137–147)

## 2013-07-05 LAB — POCT I-STAT 3, ART BLOOD GAS (G3+)
ACID-BASE EXCESS: 1 mmol/L (ref 0.0–2.0)
Acid-Base Excess: 1 mmol/L (ref 0.0–2.0)
Acid-Base Excess: 1 mmol/L (ref 0.0–2.0)
Acid-base deficit: 1 mmol/L (ref 0.0–2.0)
BICARBONATE: 25.9 meq/L — AB (ref 20.0–24.0)
Bicarbonate: 24 mEq/L (ref 20.0–24.0)
Bicarbonate: 26 mEq/L — ABNORMAL HIGH (ref 20.0–24.0)
Bicarbonate: 25.9 mEq/L — ABNORMAL HIGH (ref 20.0–24.0)
Bicarbonate: 24.1 mEq/L — ABNORMAL HIGH (ref 20.0–24.0)
O2 SAT: 100 %
O2 SAT: 96 %
O2 SAT: 99 %
O2 SAT: 99 %
O2 Saturation: 99 %
PCO2 ART: 42.1 mmHg (ref 35.0–45.0)
PH ART: 7.403 (ref 7.350–7.450)
PH ART: 7.352 (ref 7.350–7.450)
PO2 ART: 163 mmHg — AB (ref 80.0–100.0)
Patient temperature: 36.2
Patient temperature: 38.3
Patient temperature: 38.2
TCO2: 27 mmol/L (ref 0–100)
TCO2: 25 mmol/L (ref 0–100)
TCO2: 27 mmol/L (ref 0–100)
TCO2: 27 mmol/L (ref 0–100)
TCO2: 25 mmol/L (ref 0–100)
pCO2 arterial: 41.2 mmHg (ref 35.0–45.0)
pCO2 arterial: 37 mmHg (ref 35.0–45.0)
pCO2 arterial: 41.8 mmHg (ref 35.0–45.0)
pCO2 arterial: 44.1 mmHg (ref 35.0–45.0)
pH, Arterial: 7.407 (ref 7.350–7.450)
pH, Arterial: 7.419 (ref 7.350–7.450)
pH, Arterial: 7.398 (ref 7.350–7.450)
pO2, Arterial: 340 mmHg — ABNORMAL HIGH (ref 80.0–100.0)
pO2, Arterial: 148 mmHg — ABNORMAL HIGH (ref 80.0–100.0)
pO2, Arterial: 76 mmHg — ABNORMAL LOW (ref 80.0–100.0)
pO2, Arterial: 131 mmHg — ABNORMAL HIGH (ref 80.0–100.0)

## 2013-07-05 LAB — GLUCOSE, CAPILLARY
GLUCOSE-CAPILLARY: 101 mg/dL — AB (ref 70–99)
Glucose-Capillary: 99 mg/dL (ref 70–99)
Glucose-Capillary: 106 mg/dL — ABNORMAL HIGH (ref 70–99)
Glucose-Capillary: 112 mg/dL — ABNORMAL HIGH (ref 70–99)
Glucose-Capillary: 117 mg/dL — ABNORMAL HIGH (ref 70–99)

## 2013-07-05 LAB — POCT I-STAT 4, (NA,K, GLUC, HGB,HCT)
GLUCOSE: 109 mg/dL — AB (ref 70–99)
GLUCOSE: 109 mg/dL — AB (ref 70–99)
Glucose, Bld: 94 mg/dL (ref 70–99)
Glucose, Bld: 134 mg/dL — ABNORMAL HIGH (ref 70–99)
Glucose, Bld: 154 mg/dL — ABNORMAL HIGH (ref 70–99)
Glucose, Bld: 129 mg/dL — ABNORMAL HIGH (ref 70–99)
HCT: 46 % (ref 39.0–52.0)
HCT: 41 % (ref 39.0–52.0)
HCT: 33 % — ABNORMAL LOW (ref 39.0–52.0)
HCT: 33 % — ABNORMAL LOW (ref 39.0–52.0)
HCT: 34 % — ABNORMAL LOW (ref 39.0–52.0)
HEMATOCRIT: 33 % — AB (ref 39.0–52.0)
HEMOGLOBIN: 13.9 g/dL (ref 13.0–17.0)
HEMOGLOBIN: 11.2 g/dL — AB (ref 13.0–17.0)
Hemoglobin: 15.6 g/dL (ref 13.0–17.0)
Hemoglobin: 11.2 g/dL — ABNORMAL LOW (ref 13.0–17.0)
Hemoglobin: 11.2 g/dL — ABNORMAL LOW (ref 13.0–17.0)
Hemoglobin: 11.6 g/dL — ABNORMAL LOW (ref 13.0–17.0)
POTASSIUM: 3.7 meq/L (ref 3.7–5.3)
POTASSIUM: 3.7 meq/L (ref 3.7–5.3)
POTASSIUM: 3.6 meq/L — AB (ref 3.7–5.3)
Potassium: 3.5 mEq/L — ABNORMAL LOW (ref 3.7–5.3)
Potassium: 3.6 mEq/L — ABNORMAL LOW (ref 3.7–5.3)
Potassium: 4.9 mEq/L (ref 3.7–5.3)
SODIUM: 139 meq/L (ref 137–147)
SODIUM: 141 meq/L (ref 137–147)
Sodium: 142 mEq/L (ref 137–147)
Sodium: 141 mEq/L (ref 137–147)
Sodium: 139 mEq/L (ref 137–147)
Sodium: 140 mEq/L (ref 137–147)

## 2013-07-05 LAB — CBC
HCT: 28.8 % — ABNORMAL LOW (ref 39.0–52.0)
HEMATOCRIT: 47.7 % (ref 39.0–52.0)
HEMATOCRIT: 32.8 % — AB (ref 39.0–52.0)
HEMOGLOBIN: 16.8 g/dL (ref 13.0–17.0)
Hemoglobin: 11.6 g/dL — ABNORMAL LOW (ref 13.0–17.0)
Hemoglobin: 9.8 g/dL — ABNORMAL LOW (ref 13.0–17.0)
MCH: 30.5 pg (ref 26.0–34.0)
MCH: 30.6 pg (ref 26.0–34.0)
MCH: 29.5 pg (ref 26.0–34.0)
MCHC: 35.2 g/dL (ref 30.0–36.0)
MCHC: 35.4 g/dL (ref 30.0–36.0)
MCHC: 34 g/dL (ref 30.0–36.0)
MCV: 86.7 fL (ref 78.0–100.0)
MCV: 86.5 fL (ref 78.0–100.0)
MCV: 86.7 fL (ref 78.0–100.0)
Platelets: 206 10*3/uL (ref 150–400)
Platelets: 160 10*3/uL (ref 150–400)
Platelets: 170 10*3/uL (ref 150–400)
RBC: 5.5 MIL/uL (ref 4.22–5.81)
RBC: 3.79 MIL/uL — ABNORMAL LOW (ref 4.22–5.81)
RBC: 3.32 MIL/uL — ABNORMAL LOW (ref 4.22–5.81)
RDW: 13.2 % (ref 11.5–15.5)
RDW: 13.2 % (ref 11.5–15.5)
RDW: 13.5 % (ref 11.5–15.5)
WBC: 7 10*3/uL (ref 4.0–10.5)
WBC: 15.9 10*3/uL — ABNORMAL HIGH (ref 4.0–10.5)
WBC: 13.7 10*3/uL — ABNORMAL HIGH (ref 4.0–10.5)

## 2013-07-05 LAB — POCT I-STAT, CHEM 8
BUN: 8 mg/dL (ref 6–23)
CREATININE: 0.8 mg/dL (ref 0.50–1.35)
Calcium, Ion: 1.19 mmol/L (ref 1.12–1.23)
Chloride: 103 mEq/L (ref 96–112)
Glucose, Bld: 141 mg/dL — ABNORMAL HIGH (ref 70–99)
HCT: 29 % — ABNORMAL LOW (ref 39.0–52.0)
Hemoglobin: 9.9 g/dL — ABNORMAL LOW (ref 13.0–17.0)
POTASSIUM: 4.8 meq/L (ref 3.7–5.3)
SODIUM: 140 meq/L (ref 137–147)
TCO2: 23 mmol/L (ref 0–100)

## 2013-07-05 LAB — HEMOGLOBIN AND HEMATOCRIT, BLOOD
HCT: 32.9 % — ABNORMAL LOW (ref 39.0–52.0)
Hemoglobin: 11.7 g/dL — ABNORMAL LOW (ref 13.0–17.0)

## 2013-07-05 LAB — PROTIME-INR
INR: 1.45 (ref 0.00–1.49)
Prothrombin Time: 17.3 seconds — ABNORMAL HIGH (ref 11.6–15.2)

## 2013-07-05 LAB — CREATININE, SERUM
Creatinine, Ser: 0.77 mg/dL (ref 0.50–1.35)
GFR calc Af Amer: >90 mL/min (ref >90)
GFR calc non Af Amer: >90 mL/min (ref >90)

## 2013-07-05 LAB — HEPARIN LEVEL (UNFRACTIONATED): Heparin Unfractionated: 0.43 IU/mL (ref 0.30–0.70)

## 2013-07-05 LAB — PLATELET INHIBITION P2Y12: Platelet Function  P2Y12: 175 [PRU] — ABNORMAL LOW (ref 194–418)

## 2013-07-05 LAB — PLATELET COUNT: Platelets: 166 10*3/uL (ref 150–400)

## 2013-07-05 LAB — APTT: aPTT: 28 seconds (ref 24–37)

## 2013-07-05 LAB — ABO/RH: ABO/RH(D): B POS

## 2013-07-05 LAB — MAGNESIUM: Magnesium: 2.8 mg/dL — ABNORMAL HIGH (ref 1.5–2.5)

## 2013-07-05 SURGERY — CORONARY ARTERY BYPASS GRAFTING (CABG)
Anesthesia: General | Site: Chest

## 2013-07-05 MED ORDER — PROTAMINE SULFATE 10 MG/ML IV SOLN
INTRAVENOUS | Status: DC | PRN
  Administered 2013-07-05: 50 mg via INTRAVENOUS
  Administered 2013-07-05: 20 mg via INTRAVENOUS
  Administered 2013-07-05: 30 mg via INTRAVENOUS
  Administered 2013-07-05 (×3): 50 mg via INTRAVENOUS

## 2013-07-05 MED ORDER — MIDAZOLAM HCL 5 MG/5ML IJ SOLN
INTRAMUSCULAR | Status: DC | PRN
  Administered 2013-07-05 (×2): 2 mg via INTRAVENOUS
  Administered 2013-07-05: 3 mg via INTRAVENOUS
  Administered 2013-07-05: 5 mg via INTRAVENOUS

## 2013-07-05 MED ORDER — KETOROLAC TROMETHAMINE 15 MG/ML IJ SOLN
15.0000 mg | Freq: Four times a day (QID) | INTRAMUSCULAR | Status: AC
  Administered 2013-07-05 – 2013-07-06 (×5): 15 mg via INTRAVENOUS
  Filled 2013-07-05 (×6): qty 1

## 2013-07-05 MED ORDER — OXYCODONE HCL 5 MG PO TABS
5.0000 mg | ORAL_TABLET | ORAL | Status: DC | PRN
  Administered 2013-07-06 – 2013-07-07 (×2): 5 mg via ORAL
  Administered 2013-07-07: 10 mg via ORAL
  Administered 2013-07-07: 5 mg via ORAL
  Administered 2013-07-07 (×2): 10 mg via ORAL
  Administered 2013-07-07: 5 mg via ORAL
  Administered 2013-07-08 (×2): 10 mg via ORAL
  Administered 2013-07-11: 5 mg via ORAL
  Filled 2013-07-05 (×2): qty 1
  Filled 2013-07-05: qty 2
  Filled 2013-07-05: qty 1
  Filled 2013-07-05: qty 2
  Filled 2013-07-05 (×2): qty 1
  Filled 2013-07-05 (×3): qty 2

## 2013-07-05 MED ORDER — METOCLOPRAMIDE HCL 5 MG/ML IJ SOLN
10.0000 mg | Freq: Four times a day (QID) | INTRAMUSCULAR | Status: AC
  Administered 2013-07-05 – 2013-07-06 (×3): 10 mg via INTRAVENOUS
  Filled 2013-07-05 (×3): qty 2

## 2013-07-05 MED ORDER — PROPOFOL 10 MG/ML IV BOLUS
INTRAVENOUS | Status: AC
  Filled 2013-07-05: qty 20

## 2013-07-05 MED ORDER — BISACODYL 5 MG PO TBEC
10.0000 mg | DELAYED_RELEASE_TABLET | Freq: Every day | ORAL | Status: DC
  Administered 2013-07-06 – 2013-07-11 (×5): 10 mg via ORAL
  Filled 2013-07-05 (×5): qty 2

## 2013-07-05 MED ORDER — MIDAZOLAM HCL 2 MG/2ML IJ SOLN
INTRAMUSCULAR | Status: AC
  Filled 2013-07-05: qty 2

## 2013-07-05 MED ORDER — ROCURONIUM BROMIDE 100 MG/10ML IV SOLN
INTRAVENOUS | Status: DC | PRN
  Administered 2013-07-05: 50 mg via INTRAVENOUS

## 2013-07-05 MED ORDER — MIDAZOLAM HCL 2 MG/2ML IJ SOLN
2.0000 mg | INTRAMUSCULAR | Status: DC | PRN
  Administered 2013-07-05: 2 mg via INTRAVENOUS
  Filled 2013-07-05 (×2): qty 2

## 2013-07-05 MED ORDER — CALCIUM CHLORIDE 10 % IV SOLN
INTRAVENOUS | Status: AC
  Filled 2013-07-05: qty 10

## 2013-07-05 MED ORDER — FENTANYL CITRATE 0.05 MG/ML IJ SOLN
INTRAMUSCULAR | Status: AC
  Filled 2013-07-05: qty 5

## 2013-07-05 MED ORDER — VECURONIUM BROMIDE 10 MG IV SOLR
INTRAVENOUS | Status: AC
  Filled 2013-07-05: qty 10

## 2013-07-05 MED ORDER — SODIUM CHLORIDE 0.9 % IV SOLN
INTRAVENOUS | Status: DC | PRN
  Administered 2013-07-05 (×2): via INTRAVENOUS

## 2013-07-05 MED ORDER — METOPROLOL TARTRATE 12.5 MG HALF TABLET
12.5000 mg | ORAL_TABLET | Freq: Two times a day (BID) | ORAL | Status: DC
  Administered 2013-07-06: 12.5 mg via ORAL
  Filled 2013-07-05 (×3): qty 1

## 2013-07-05 MED ORDER — FENTANYL CITRATE 0.05 MG/ML IJ SOLN
INTRAMUSCULAR | Status: DC | PRN
  Administered 2013-07-05: 50 ug via INTRAVENOUS
  Administered 2013-07-05: 150 ug via INTRAVENOUS
  Administered 2013-07-05: 100 ug via INTRAVENOUS
  Administered 2013-07-05 (×2): 250 ug via INTRAVENOUS
  Administered 2013-07-05: 100 ug via INTRAVENOUS
  Administered 2013-07-05: 250 ug via INTRAVENOUS
  Administered 2013-07-05: 100 ug via INTRAVENOUS
  Administered 2013-07-05: 250 ug via INTRAVENOUS

## 2013-07-05 MED ORDER — HEPARIN SODIUM (PORCINE) 1000 UNIT/ML IJ SOLN
INTRAMUSCULAR | Status: DC | PRN
  Administered 2013-07-05: 18000 [IU] via INTRAVENOUS
  Administered 2013-07-05: 2000 [IU] via INTRAVENOUS
  Administered 2013-07-05: 3000 [IU] via INTRAVENOUS

## 2013-07-05 MED ORDER — MAGNESIUM SULFATE 40 MG/ML IJ SOLN
4.0000 g | Freq: Once | INTRAMUSCULAR | Status: AC
  Administered 2013-07-05: 4 g via INTRAVENOUS
  Filled 2013-07-05: qty 100

## 2013-07-05 MED ORDER — INSULIN REGULAR BOLUS VIA INFUSION
0.0000 [IU] | Freq: Three times a day (TID) | INTRAVENOUS | Status: DC
  Filled 2013-07-05: qty 10

## 2013-07-05 MED ORDER — ROCURONIUM BROMIDE 50 MG/5ML IV SOLN
INTRAVENOUS | Status: AC
  Filled 2013-07-05: qty 1

## 2013-07-05 MED ORDER — LACTATED RINGERS IV SOLN: 500.0000 mL | Freq: Once | INTRAVENOUS | Status: DC | PRN

## 2013-07-05 MED ORDER — HEPARIN SODIUM (PORCINE) 1000 UNIT/ML IJ SOLN
INTRAMUSCULAR | Status: AC
  Filled 2013-07-05: qty 1

## 2013-07-05 MED ORDER — MORPHINE SULFATE 2 MG/ML IJ SOLN
2.0000 mg | INTRAMUSCULAR | Status: DC | PRN
  Administered 2013-07-05 – 2013-07-06 (×5): 2 mg via INTRAVENOUS
  Filled 2013-07-05 (×5): qty 1

## 2013-07-05 MED ORDER — CALCIUM CHLORIDE 10 % IV SOLN
INTRAVENOUS | Status: DC | PRN
  Administered 2013-07-05 (×3): 0.5 g via INTRAVENOUS

## 2013-07-05 MED ORDER — BISACODYL 10 MG RE SUPP: 10.0000 mg | Freq: Every day | RECTAL | Status: DC

## 2013-07-05 MED ORDER — INSULIN REGULAR HUMAN 100 UNIT/ML IJ SOLN
INTRAMUSCULAR | Status: DC
  Filled 2013-07-05: qty 1

## 2013-07-05 MED ORDER — CHLORHEXIDINE GLUCONATE CLOTH 2 % EX PADS: 6.0000 | MEDICATED_PAD | Freq: Once | CUTANEOUS | Status: DC

## 2013-07-05 MED ORDER — SODIUM CHLORIDE 0.9 % IV SOLN
INTRAVENOUS | Status: DC
  Administered 2013-07-05: 14:00:00 via INTRAVENOUS

## 2013-07-05 MED ORDER — SODIUM CHLORIDE 0.45 % IV SOLN
INTRAVENOUS | Status: DC
  Administered 2013-07-05: 14:00:00 via INTRAVENOUS

## 2013-07-05 MED ORDER — ACETAMINOPHEN 500 MG PO TABS
1000.0000 mg | ORAL_TABLET | Freq: Four times a day (QID) | ORAL | Status: AC
  Administered 2013-07-05 – 2013-07-10 (×18): 1000 mg via ORAL
  Filled 2013-07-05 (×22): qty 2

## 2013-07-05 MED ORDER — ALBUMIN HUMAN 5 % IV SOLN
250.0000 mL | INTRAVENOUS | Status: AC | PRN
  Administered 2013-07-05 – 2013-07-06 (×3): 250 mL via INTRAVENOUS
  Filled 2013-07-05: qty 250

## 2013-07-05 MED ORDER — METOPROLOL TARTRATE 12.5 MG HALF TABLET: 12.5000 mg | ORAL_TABLET | Freq: Once | ORAL | Status: DC

## 2013-07-05 MED ORDER — ACETAMINOPHEN 160 MG/5ML PO SOLN
1000.0000 mg | Freq: Four times a day (QID) | ORAL | Status: DC
Start: 2013-07-06 — End: 2013-07-06

## 2013-07-05 MED ORDER — ACETAMINOPHEN 160 MG/5ML PO SOLN
650.0000 mg | Freq: Once | ORAL | Status: AC
Start: 2013-07-05 — End: 2013-07-05

## 2013-07-05 MED ORDER — MORPHINE SULFATE 2 MG/ML IJ SOLN
1.0000 mg | INTRAMUSCULAR | Status: AC | PRN
  Filled 2013-07-05: qty 1

## 2013-07-05 MED ORDER — VECURONIUM BROMIDE 10 MG IV SOLR
INTRAVENOUS | Status: DC | PRN
  Administered 2013-07-05 (×4): 5 mg via INTRAVENOUS

## 2013-07-05 MED ORDER — LIDOCAINE HCL (CARDIAC) 20 MG/ML IV SOLN
INTRAVENOUS | Status: AC
  Filled 2013-07-05: qty 5

## 2013-07-05 MED ORDER — POTASSIUM CHLORIDE 10 MEQ/50ML IV SOLN
10.0000 meq | Freq: Once | INTRAVENOUS | Status: AC
  Administered 2013-07-05: 10 meq via INTRAVENOUS
  Filled 2013-07-05: qty 50

## 2013-07-05 MED ORDER — SUCCINYLCHOLINE CHLORIDE 20 MG/ML IJ SOLN
INTRAMUSCULAR | Status: AC
  Filled 2013-07-05: qty 1

## 2013-07-05 MED ORDER — ONDANSETRON HCL 4 MG/2ML IJ SOLN: 4.0000 mg | Freq: Four times a day (QID) | INTRAMUSCULAR | Status: DC | PRN

## 2013-07-05 MED ORDER — MIDAZOLAM HCL 10 MG/2ML IJ SOLN
INTRAMUSCULAR | Status: AC
  Filled 2013-07-05: qty 2

## 2013-07-05 MED ORDER — POTASSIUM CHLORIDE 10 MEQ/50ML IV SOLN
10.0000 meq | INTRAVENOUS | Status: AC
  Administered 2013-07-05 (×3): 10 meq via INTRAVENOUS

## 2013-07-05 MED ORDER — DOCUSATE SODIUM 100 MG PO CAPS
200.0000 mg | ORAL_CAPSULE | Freq: Every day | ORAL | Status: DC
  Administered 2013-07-06 – 2013-07-11 (×6): 200 mg via ORAL
  Filled 2013-07-05 (×6): qty 2

## 2013-07-05 MED ORDER — PHENYLEPHRINE HCL 10 MG/ML IJ SOLN
10.0000 mg | INTRAMUSCULAR | Status: DC | PRN
  Administered 2013-07-05: 25 ug/min via INTRAVENOUS

## 2013-07-05 MED ORDER — METOPROLOL TARTRATE 1 MG/ML IV SOLN
INTRAVENOUS | Status: DC | PRN
  Administered 2013-07-05: 5 mg via INTRAVENOUS

## 2013-07-05 MED ORDER — SODIUM CHLORIDE 0.9 % IJ SOLN
3.0000 mL | Freq: Two times a day (BID) | INTRAMUSCULAR | Status: DC
  Administered 2013-07-06 – 2013-07-07 (×3): 3 mL via INTRAVENOUS

## 2013-07-05 MED ORDER — SODIUM CHLORIDE 0.9 % IV SOLN: 250.0000 mL | INTRAVENOUS | Status: DC

## 2013-07-05 MED ORDER — NITROGLYCERIN IN D5W 200-5 MCG/ML-% IV SOLN: 0.0000 ug/min | INTRAVENOUS | Status: DC

## 2013-07-05 MED ORDER — MICROFIBRILLAR COLL HEMOSTAT EX PADS
MEDICATED_PAD | CUTANEOUS | Status: DC | PRN
  Administered 2013-07-05: 1 via TOPICAL

## 2013-07-05 MED ORDER — CHLORHEXIDINE GLUCONATE CLOTH 2 % EX PADS
6.0000 | MEDICATED_PAD | Freq: Every day | CUTANEOUS | Status: DC
  Administered 2013-07-05: 6 via TOPICAL

## 2013-07-05 MED ORDER — SODIUM CHLORIDE 0.9 % IV SOLN
20.0000 ug | INTRAVENOUS | Status: AC
  Administered 2013-07-05: 20 ug via INTRAVENOUS
  Filled 2013-07-05: qty 5

## 2013-07-05 MED ORDER — PROPOFOL 10 MG/ML IV BOLUS
INTRAVENOUS | Status: DC | PRN
  Administered 2013-07-05: 50 mg via INTRAVENOUS
  Administered 2013-07-05: 100 mg via INTRAVENOUS

## 2013-07-05 MED ORDER — ASPIRIN 81 MG PO CHEW: 324.0000 mg | CHEWABLE_TABLET | Freq: Every day | ORAL | Status: DC

## 2013-07-05 MED ORDER — LACTATED RINGERS IV SOLN
INTRAVENOUS | Status: DC
  Administered 2013-07-05: 14:00:00 via INTRAVENOUS
  Administered 2013-07-05: 1 mL via INTRAVENOUS

## 2013-07-05 MED ORDER — PANTOPRAZOLE SODIUM 40 MG PO TBEC
40.0000 mg | DELAYED_RELEASE_TABLET | Freq: Every day | ORAL | Status: DC
  Administered 2013-07-07 – 2013-07-11 (×5): 40 mg via ORAL
  Filled 2013-07-05 (×5): qty 1

## 2013-07-05 MED ORDER — SODIUM CHLORIDE 0.9 % IJ SOLN: 3.0000 mL | INTRAMUSCULAR | Status: DC | PRN

## 2013-07-05 MED ORDER — FAMOTIDINE IN NACL 20-0.9 MG/50ML-% IV SOLN
20.0000 mg | Freq: Two times a day (BID) | INTRAVENOUS | Status: AC
  Administered 2013-07-05: 20 mg via INTRAVENOUS

## 2013-07-05 MED ORDER — METOPROLOL TARTRATE 12.5 MG HALF TABLET
12.5000 mg | ORAL_TABLET | Freq: Once | ORAL | Status: AC
  Administered 2013-07-05: 12.5 mg via ORAL
  Filled 2013-07-05: qty 1

## 2013-07-05 MED ORDER — ACETAMINOPHEN 650 MG RE SUPP
650.0000 mg | Freq: Once | RECTAL | Status: AC
Start: 2013-07-05 — End: 2013-07-05
  Administered 2013-07-05: 650 mg via RECTAL

## 2013-07-05 MED ORDER — 0.9 % SODIUM CHLORIDE (POUR BTL) OPTIME
TOPICAL | Status: DC | PRN
  Administered 2013-07-05: 7000 mL

## 2013-07-05 MED ORDER — ASPIRIN EC 325 MG PO TBEC
325.0000 mg | DELAYED_RELEASE_TABLET | Freq: Every day | ORAL | Status: DC
  Administered 2013-07-06 – 2013-07-11 (×6): 325 mg via ORAL
  Filled 2013-07-05 (×6): qty 1

## 2013-07-05 MED ORDER — METOPROLOL TARTRATE 25 MG/10 ML ORAL SUSPENSION
12.5000 mg | Freq: Two times a day (BID) | ORAL | Status: DC
Start: 2013-07-05 — End: 2013-07-06
  Filled 2013-07-05 (×3): qty 5

## 2013-07-05 MED ORDER — BISACODYL 5 MG PO TBEC
5.0000 mg | DELAYED_RELEASE_TABLET | Freq: Once | ORAL | Status: DC
  Filled 2013-07-05: qty 1

## 2013-07-05 MED ORDER — DEXMEDETOMIDINE HCL IN NACL 200 MCG/50ML IV SOLN
0.1000 ug/kg/h | INTRAVENOUS | Status: DC
  Administered 2013-07-05: 0.4 ug/kg/h via INTRAVENOUS
  Filled 2013-07-05: qty 50

## 2013-07-05 MED ORDER — SODIUM CHLORIDE 0.9 % IJ SOLN
OROMUCOSAL | Status: DC | PRN
  Administered 2013-07-05 (×3): via TOPICAL

## 2013-07-05 MED ORDER — DEXTROSE 5 % IV SOLN
0.0000 ug/min | INTRAVENOUS | Status: DC
  Administered 2013-07-06: 20 ug/min via INTRAVENOUS
  Filled 2013-07-05 (×2): qty 2

## 2013-07-05 MED ORDER — DOPAMINE-DEXTROSE 3.2-5 MG/ML-% IV SOLN
INTRAVENOUS | Status: DC | PRN
  Administered 2013-07-05: 2.5 ug/kg/min via INTRAVENOUS

## 2013-07-05 MED ORDER — TEMAZEPAM 15 MG PO CAPS: 15.0000 mg | ORAL_CAPSULE | Freq: Once | ORAL | Status: DC | PRN

## 2013-07-05 MED ORDER — LACTATED RINGERS IV SOLN
INTRAVENOUS | Status: DC | PRN
  Administered 2013-07-05 (×5): via INTRAVENOUS

## 2013-07-05 MED ORDER — INSULIN ASPART 100 UNIT/ML ~~LOC~~ SOLN
0.0000 [IU] | SUBCUTANEOUS | Status: DC
  Administered 2013-07-06 (×2): 2 [IU] via SUBCUTANEOUS

## 2013-07-05 MED ORDER — VANCOMYCIN HCL IN DEXTROSE 1-5 GM/200ML-% IV SOLN
1000.0000 mg | Freq: Once | INTRAVENOUS | Status: AC
  Administered 2013-07-05: 1000 mg via INTRAVENOUS
  Filled 2013-07-05: qty 200

## 2013-07-05 MED ORDER — ALBUMIN HUMAN 5 % IV SOLN
INTRAVENOUS | Status: DC | PRN
  Administered 2013-07-05 (×2): via INTRAVENOUS

## 2013-07-05 MED ORDER — STERILE WATER FOR INJECTION IJ SOLN
INTRAMUSCULAR | Status: AC
  Filled 2013-07-05: qty 10

## 2013-07-05 MED ORDER — ARTIFICIAL TEARS OP OINT
TOPICAL_OINTMENT | OPHTHALMIC | Status: AC
  Filled 2013-07-05: qty 3.5

## 2013-07-05 MED ORDER — METOPROLOL TARTRATE 1 MG/ML IV SOLN: 2.5000 mg | INTRAVENOUS | Status: DC | PRN

## 2013-07-05 MED ORDER — PROTAMINE SULFATE 10 MG/ML IV SOLN
INTRAVENOUS | Status: AC
Start: 2013-07-05 — End: 2013-07-05
  Filled 2013-07-05: qty 25

## 2013-07-05 MED ORDER — CEFUROXIME SODIUM 1.5 G IJ SOLR
1.5000 g | Freq: Two times a day (BID) | INTRAMUSCULAR | Status: AC
  Administered 2013-07-05 – 2013-07-07 (×4): 1.5 g via INTRAVENOUS
  Filled 2013-07-05 (×4): qty 1.5

## 2013-07-05 MED ORDER — ARTIFICIAL TEARS OP OINT
TOPICAL_OINTMENT | OPHTHALMIC | Status: DC | PRN
  Administered 2013-07-05: 1 via OPHTHALMIC

## 2013-07-05 SURGICAL SUPPLY — 102 items
ADAPTER CARDIO PERF ANTE/RETRO (ADAPTER) ×4 IMPLANT
ATTRACTOMAT 16X20 MAGNETIC DRP (DRAPES) ×4 IMPLANT
BAG DECANTER FOR FLEXI CONT (MISCELLANEOUS) ×4 IMPLANT
BANDAGE ELASTIC 4 VELCRO ST LF (GAUZE/BANDAGES/DRESSINGS) ×4 IMPLANT
BANDAGE ELASTIC 6 VELCRO ST LF (GAUZE/BANDAGES/DRESSINGS) ×4 IMPLANT
BANDAGE GAUZE ELAST BULKY 4 IN (GAUZE/BANDAGES/DRESSINGS) ×4 IMPLANT
BASKET HEART  (ORDER IN 25'S) (MISCELLANEOUS) ×1
BASKET HEART (ORDER IN 25'S) (MISCELLANEOUS) ×1
BASKET HEART (ORDER IN 25S) (MISCELLANEOUS) ×2 IMPLANT
BENZOIN TINCTURE PRP APPL 2/3 (GAUZE/BANDAGES/DRESSINGS) ×4 IMPLANT
BLADE STERNUM SYSTEM 6 (BLADE) ×4 IMPLANT
BLADE SURG 11 STRL SS (BLADE) ×4 IMPLANT
BLADE SURG 12 STRL SS (BLADE) ×4 IMPLANT
BLADE SURG ROTATE 9660 (MISCELLANEOUS) IMPLANT
CANISTER SUCTION 2500CC (MISCELLANEOUS) ×4 IMPLANT
CANNULA GUNDRY RCSP 15FR (MISCELLANEOUS) ×4 IMPLANT
CANNULA VENOUS LOW PROF 32X40 (CANNULA) IMPLANT
CARDIAC SUCTION (MISCELLANEOUS) ×4 IMPLANT
CATH CPB KIT VANTRIGT (MISCELLANEOUS) ×4 IMPLANT
CATH ROBINSON RED A/P 18FR (CATHETERS) ×12 IMPLANT
CATH THORACIC 36FR RT ANG (CATHETERS) ×4 IMPLANT
CLIP RETRACTION 3.0MM CORONARY (MISCELLANEOUS) ×4 IMPLANT
CLIP TI WIDE RED SMALL 24 (CLIP) ×4 IMPLANT
CLOSURE STERI-STRIP 1/2X4 (GAUZE/BANDAGES/DRESSINGS) ×1
CLSR STERI-STRIP ANTIMIC 1/2X4 (GAUZE/BANDAGES/DRESSINGS) ×3 IMPLANT
COVER SURGICAL LIGHT HANDLE (MISCELLANEOUS) ×4 IMPLANT
CRADLE DONUT ADULT HEAD (MISCELLANEOUS) ×4 IMPLANT
DRAIN CHANNEL 32F RND 10.7 FF (WOUND CARE) ×4 IMPLANT
DRAPE CARDIOVASCULAR INCISE (DRAPES) ×2
DRAPE SLUSH/WARMER DISC (DRAPES) ×4 IMPLANT
DRAPE SRG 135X102X78XABS (DRAPES) ×2 IMPLANT
DRSG AQUACEL AG ADV 3.5X14 (GAUZE/BANDAGES/DRESSINGS) ×4 IMPLANT
ELECT BLADE 4.0 EZ CLEAN MEGAD (MISCELLANEOUS) ×4
ELECT BLADE 6.5 EXT (BLADE) ×4 IMPLANT
ELECT CAUTERY BLADE 6.4 (BLADE) ×4 IMPLANT
ELECT REM PT RETURN 9FT ADLT (ELECTROSURGICAL) ×8
ELECTRODE BLDE 4.0 EZ CLN MEGD (MISCELLANEOUS) ×2 IMPLANT
ELECTRODE REM PT RTRN 9FT ADLT (ELECTROSURGICAL) ×4 IMPLANT
GLOVE BIO SURGEON STRL SZ 6 (GLOVE) ×24 IMPLANT
GLOVE BIO SURGEON STRL SZ 6.5 (GLOVE) ×12 IMPLANT
GLOVE BIO SURGEON STRL SZ7.5 (GLOVE) ×12 IMPLANT
GLOVE BIO SURGEONS STRL SZ 6.5 (GLOVE) ×4
GLOVE BIOGEL PI IND STRL 6.5 (GLOVE) ×12 IMPLANT
GLOVE BIOGEL PI INDICATOR 6.5 (GLOVE) ×12
GLOVE SURG SS PI 6.5 STRL IVOR (GLOVE) ×4 IMPLANT
GOWN STRL NON-REIN LRG LVL3 (GOWN DISPOSABLE) ×16 IMPLANT
GOWN STRL REUS W/ TWL LRG LVL3 (GOWN DISPOSABLE) ×20 IMPLANT
GOWN STRL REUS W/TWL LRG LVL3 (GOWN DISPOSABLE) ×20
HEMOSTAT POWDER SURGIFOAM 1G (HEMOSTASIS) ×12 IMPLANT
HEMOSTAT SURGICEL 2X14 (HEMOSTASIS) ×4 IMPLANT
INSERT FOGARTY XLG (MISCELLANEOUS) IMPLANT
KIT BASIN OR (CUSTOM PROCEDURE TRAY) ×4 IMPLANT
KIT ROOM TURNOVER OR (KITS) ×4 IMPLANT
KIT SUCTION CATH 14FR (SUCTIONS) ×4 IMPLANT
KIT VASOVIEW W/TROCAR VH 2000 (KITS) ×4 IMPLANT
LEAD PACING MYOCARDI (MISCELLANEOUS) ×4 IMPLANT
MARKER GRAFT CORONARY BYPASS (MISCELLANEOUS) ×12 IMPLANT
NS IRRIG 1000ML POUR BTL (IV SOLUTION) ×28 IMPLANT
PACK OPEN HEART (CUSTOM PROCEDURE TRAY) ×4 IMPLANT
PAD ARMBOARD 7.5X6 YLW CONV (MISCELLANEOUS) ×8 IMPLANT
PAD ELECT DEFIB RADIOL ZOLL (MISCELLANEOUS) ×4 IMPLANT
PENCIL BUTTON HOLSTER BLD 10FT (ELECTRODE) ×4 IMPLANT
PUNCH AORTIC ROTATE 4.0MM (MISCELLANEOUS) IMPLANT
PUNCH AORTIC ROTATE 4.5MM 8IN (MISCELLANEOUS) ×4 IMPLANT
PUNCH AORTIC ROTATE 5MM 8IN (MISCELLANEOUS) IMPLANT
SPONGE GAUZE 4X4 12PLY (GAUZE/BANDAGES/DRESSINGS) ×8 IMPLANT
SPONGE LAP 18X18 X RAY DECT (DISPOSABLE) ×8 IMPLANT
SURGIFLO W/THROMBIN 8M KIT (HEMOSTASIS) ×4 IMPLANT
SUT BONE WAX W31G (SUTURE) ×4 IMPLANT
SUT ETHILON 3 0 FSL (SUTURE) ×4 IMPLANT
SUT MNCRL AB 4-0 PS2 18 (SUTURE) ×4 IMPLANT
SUT PROLENE 3 0 SH DA (SUTURE) IMPLANT
SUT PROLENE 3 0 SH1 36 (SUTURE) IMPLANT
SUT PROLENE 4 0 RB 1 (SUTURE) ×2
SUT PROLENE 4 0 SH DA (SUTURE) ×12 IMPLANT
SUT PROLENE 4-0 RB1 .5 CRCL 36 (SUTURE) ×2 IMPLANT
SUT PROLENE 5 0 C 1 36 (SUTURE) IMPLANT
SUT PROLENE 6 0 C 1 30 (SUTURE) ×12 IMPLANT
SUT PROLENE 6 0 CC (SUTURE) ×16 IMPLANT
SUT PROLENE 8 0 BV175 6 (SUTURE) IMPLANT
SUT PROLENE BLUE 7 0 (SUTURE) ×8 IMPLANT
SUT SILK  1 MH (SUTURE)
SUT SILK 1 MH (SUTURE) IMPLANT
SUT SILK 2 0 SH CR/8 (SUTURE) ×4 IMPLANT
SUT SILK 3 0 SH CR/8 (SUTURE) IMPLANT
SUT STEEL 6MS V (SUTURE) ×8 IMPLANT
SUT STEEL SZ 6 DBL 3X14 BALL (SUTURE) ×4 IMPLANT
SUT VIC AB 1 CTX 36 (SUTURE) ×4
SUT VIC AB 1 CTX36XBRD ANBCTR (SUTURE) ×4 IMPLANT
SUT VIC AB 2-0 CT1 27 (SUTURE) ×2
SUT VIC AB 2-0 CT1 TAPERPNT 27 (SUTURE) ×2 IMPLANT
SUT VIC AB 2-0 CTX 27 (SUTURE) IMPLANT
SUT VIC AB 3-0 X1 27 (SUTURE) IMPLANT
SUTURE E-PAK OPEN HEART (SUTURE) ×4 IMPLANT
SYSTEM SAHARA CHEST DRAIN ATS (WOUND CARE) ×4 IMPLANT
TAPE CLOTH SURG 4X10 WHT LF (GAUZE/BANDAGES/DRESSINGS) ×8 IMPLANT
TOWEL OR 17X24 6PK STRL BLUE (TOWEL DISPOSABLE) ×8 IMPLANT
TOWEL OR 17X26 10 PK STRL BLUE (TOWEL DISPOSABLE) ×8 IMPLANT
TRAY FOLEY IC TEMP SENS 14FR (CATHETERS) ×4 IMPLANT
TUBING INSUFFLATION 10FT LAP (TUBING) ×4 IMPLANT
UNDERPAD 30X30 INCONTINENT (UNDERPADS AND DIAPERS) ×4 IMPLANT
WATER STERILE IRR 1000ML POUR (IV SOLUTION) ×8 IMPLANT

## 2013-07-05 NOTE — Progress Notes (Signed)
Echocardiogram Echocardiogram Transesophageal has been performed.  Dorothey BasemanReel, Takari Lundahl M 07/05/2013, 8:34 AM

## 2013-07-05 NOTE — Progress Notes (Signed)
TCTS BRIEF SICU PROGRESS NOTE  Day of Surgery  S/P Procedure(s) (LRB): CORONARY ARTERY BYPASS GRAFTING (CABG) (N/A) INTRAOPERATIVE TRANSESOPHAGEAL ECHOCARDIOGRAM (N/A)   Starting to wake on vent NSR/sinus tach w/ stable cardiac output and low PA pressures Chest tube output low Excellent UOP Labs okay  Plan: Continue routine early postop  James Yates H 07/05/2013 7:31 PM

## 2013-07-05 NOTE — Transfer of Care (Signed)
Immediate Anesthesia Transfer of Care Note  Patient: James LevanWalter Yates  Procedure(s) Performed: Procedure(s): CORONARY ARTERY BYPASS GRAFTING (CABG) (N/A) INTRAOPERATIVE TRANSESOPHAGEAL ECHOCARDIOGRAM (N/A)  Patient Location: ICU  Anesthesia Type:General  Level of Consciousness: sedated  Airway & Oxygen Therapy: Patient remains intubated per anesthesia plan and Patient placed on Ventilator (see vital sign flow sheet for setting)  Post-op Assessment: Post -op Vital signs reviewed and stable and report given to ICU RN   Post vital signs: Reviewed and stable  Complications: No apparent anesthesia complications

## 2013-07-05 NOTE — Significant Event (Addendum)
Patient woke up, thrashing head and bucking against ventilator. HR went to up 120s then patient vagal down, with HR dropping to 46, PA pressures in the 50s, elevated Ppeak 40-45. Patient biting on ventilator tube and bearing down. Versed 2mg  given. HR returned to sinus tach, placed at AAI @ 60 as backup. MD Donata ClayVan Trigt made aware. Will continue to monitor. Treva Huyett, RCharity fundraiser

## 2013-07-05 NOTE — Anesthesia Postprocedure Evaluation (Signed)
  Anesthesia Post-op Note  Patient: James Yates  Procedure(s) Performed: Procedure(s): CORONARY ARTERY BYPASS GRAFTING (CABG) (N/A) INTRAOPERATIVE TRANSESOPHAGEAL ECHOCARDIOGRAM (N/A)  Patient Location: ICU  Anesthesia Type:General  Level of Consciousness: Patient remains intubated per anesthesia plan  Airway and Oxygen Therapy: Patient remains intubated per anesthesia plan  Post-op Pain: none  Post-op Assessment: Post-op Vital signs reviewed, Patient's Cardiovascular Status Stable, Respiratory Function Stable and Pain level controlled  Post-op Vital Signs: Reviewed and stable  Complications: No apparent anesthesia complications

## 2013-07-05 NOTE — Progress Notes (Signed)
SICU vent wean protocol in progress.  RN aware.

## 2013-07-05 NOTE — Anesthesia Procedure Notes (Addendum)
Procedure Name: Intubation Date/Time: 07/05/2013 7:53 AM Performed by: Margaree MackintoshYACOUB, Kimani Hovis B Pre-anesthesia Checklist: Patient identified, Timeout performed, Emergency Drugs available, Suction available and Patient being monitored Patient Re-evaluated:Patient Re-evaluated prior to inductionOxygen Delivery Method: Circle system utilized Preoxygenation: Pre-oxygenation with 100% oxygen Intubation Type: IV induction Ventilation: Mask ventilation without difficulty and Oral airway inserted - appropriate to patient size Laryngoscope Size: Mac and 3 Grade View: Grade I Tube type: Oral Tube size: 8.0 mm Number of attempts: 1 Airway Equipment and Method: Stylet Placement Confirmation: ETT inserted through vocal cords under direct vision,  positive ETCO2 and breath sounds checked- equal and bilateral Secured at: 22 cm Tube secured with: Tape Dental Injury: Teeth and Oropharynx as per pre-operative assessment     Procedures: Right IJ Theone MurdochSwan Ganz Catheter Insertion: [id number] The patient was identified and consent obtained.  TO was performed, and full barrier precautions were used.  The skin was anesthetized with lidocaine-4cc plain with 25g needle.  Once the vein was located with the 22 ga. needle using ultrasound guidance , the wire was inserted into the vein.  The wire location was confirmed with ultrasound.  The tissue was dilated and the 8.5 [place] cordis catheter was carefully inserted. Afterwards Theone MurdochSwan Ganz catheter was inserted. PA catheter at 45cm.  The patient tolerated the procedure well.   CE

## 2013-07-05 NOTE — Preoperative (Signed)
Beta Blockers   Reason not to administer Beta Blockers:Not Applicable, metoprolol given 2200 2/5

## 2013-07-05 NOTE — Anesthesia Preprocedure Evaluation (Signed)
Anesthesia Evaluation  Patient identified by MRN, date of birth, ID band Patient awake    Reviewed: Allergy & Precautions, H&P , NPO status , Patient's Chart, lab work & pertinent test results, reviewed documented beta blocker date and time   History of Anesthesia Complications Negative for: history of anesthetic complications  Airway Mallampati: III TM Distance: >3 FB Neck ROM: Full    Dental  (+) Teeth Intact and Dental Advisory Given   Pulmonary neg pulmonary ROS,    Pulmonary exam normal       Cardiovascular hypertension, + CAD and + Past MI     Neuro/Psych negative neurological ROS     GI/Hepatic negative GI ROS, Neg liver ROS,   Endo/Other  negative endocrine ROS  Renal/GU negative Renal ROS     Musculoskeletal   Abdominal   Peds  Hematology negative hematology ROS (+)   Anesthesia Other Findings   Reproductive/Obstetrics                           Anesthesia Physical Anesthesia Plan  ASA: III  Anesthesia Plan: General   Post-op Pain Management:    Induction: Intravenous  Airway Management Planned: Oral ETT  Additional Equipment: Arterial line, PA Cath, 3D TEE and CVP  Intra-op Plan:   Post-operative Plan: Post-operative intubation/ventilation  Informed Consent: I have reviewed the patients History and Physical, chart, labs and discussed the procedure including the risks, benefits and alternatives for the proposed anesthesia with the patient or authorized representative who has indicated his/her understanding and acceptance.   Dental advisory given  Plan Discussed with: Anesthesiologist and Surgeon  Anesthesia Plan Comments:         Anesthesia Quick Evaluation

## 2013-07-05 NOTE — Brief Op Note (Addendum)
07/02/2013 - 07/05/2013  11:15 AM  PATIENT:  James Yates  58 y.o. male  PRE-OPERATIVE DIAGNOSIS:  CAD  POST-OPERATIVE DIAGNOSIS:  CAD  PROCEDURE:  CORONARY ARTERY BYPASS GRAFTING x 4 (LIMA-LAD, SVG-PD, SVG-OM1-OM3) ENDOSCOPIC VEIN HARVEST RIGHT LEG - skin closed with sutures  SURGEON:  Kerin PernaPeter Van Trigt, MD  ASSISTANT: Coral CeoGina Sharnise Blough, PA-C, Jari Favreessa Conte, PA-S  ANESTHESIA:   general  PATIENT CONDITION:  ICU - intubated and hemodynamically stable.  PRE-OPERATIVE WEIGHT: 90 kg

## 2013-07-05 NOTE — Procedures (Signed)
Extubation Procedure Note  Patient Details:   Name: James Yates DOB: 10/19/1955 MRN: [id number]   Airway Documentation:     Evaluation  O2 sats: stable throughout Complications: No apparent complications Patient did tolerate procedure well. Bilateral Breath Sounds: Diminished (coarse) Suctioning: Airway Yes  James Yates, James Yates 07/05/2013, 7:50 PM  Pt placed on 4L humidified O2 and IS performed x3 with 500cc Vt each time. Pt has productive cough and stable at this time.

## 2013-07-05 NOTE — Progress Notes (Signed)
SICU vent wean protocol initiated.   

## 2013-07-05 NOTE — Progress Notes (Signed)
The patient was examined and preop studies reviewed. There has been no change from the prior exam and the patient is ready for surgery.   [place] [address] [place]

## 2013-07-06 ENCOUNTER — Inpatient Hospital Stay (HOSPITAL_COMMUNITY): Payer: Medicaid Other

## 2013-07-06 LAB — BASIC METABOLIC PANEL
BUN: 14 mg/dL (ref 6–23)
CALCIUM: 7.8 mg/dL — AB (ref 8.4–10.5)
CO2: 24 mEq/L (ref 19–32)
Chloride: 105 mEq/L (ref 96–112)
Creatinine, Ser: 0.84 mg/dL (ref 0.50–1.35)
GFR calc Af Amer: >90 mL/min (ref >90)
GLUCOSE: 117 mg/dL — AB (ref 70–99)
Potassium: 4.4 mEq/L (ref 3.7–5.3)
Sodium: 140 mEq/L (ref 137–147)

## 2013-07-06 LAB — GLUCOSE, CAPILLARY
GLUCOSE-CAPILLARY: 122 mg/dL — AB (ref 70–99)
GLUCOSE-CAPILLARY: 128 mg/dL — AB (ref 70–99)
Glucose-Capillary: 119 mg/dL — ABNORMAL HIGH (ref 70–99)
Glucose-Capillary: 112 mg/dL — ABNORMAL HIGH (ref 70–99)
Glucose-Capillary: 117 mg/dL — ABNORMAL HIGH (ref 70–99)
Glucose-Capillary: 131 mg/dL — ABNORMAL HIGH (ref 70–99)

## 2013-07-06 LAB — PREPARE FRESH FROZEN PLASMA: Unit division: 0

## 2013-07-06 LAB — MAGNESIUM
MAGNESIUM: 2.3 mg/dL (ref 1.5–2.5)
Magnesium: 2.3 mg/dL (ref 1.5–2.5)

## 2013-07-06 LAB — POCT I-STAT, CHEM 8
BUN: 16 mg/dL (ref 6–23)
CALCIUM ION: 1.12 mmol/L (ref 1.12–1.23)
Chloride: 104 mEq/L (ref 96–112)
Creatinine, Ser: 0.9 mg/dL (ref 0.50–1.35)
Glucose, Bld: 149 mg/dL — ABNORMAL HIGH (ref 70–99)
HEMATOCRIT: 30 % — AB (ref 39.0–52.0)
Hemoglobin: 10.2 g/dL — ABNORMAL LOW (ref 13.0–17.0)
Potassium: 4.1 mEq/L (ref 3.7–5.3)
Sodium: 138 mEq/L (ref 137–147)
TCO2: 23 mmol/L (ref 0–100)

## 2013-07-06 LAB — CBC
HCT: 27.4 % — ABNORMAL LOW (ref 39.0–52.0)
HEMATOCRIT: 25.4 % — AB (ref 39.0–52.0)
HEMOGLOBIN: 8.9 g/dL — AB (ref 13.0–17.0)
Hemoglobin: 9.4 g/dL — ABNORMAL LOW (ref 13.0–17.0)
MCH: 30.5 pg (ref 26.0–34.0)
MCH: 29.7 pg (ref 26.0–34.0)
MCHC: 35 g/dL (ref 30.0–36.0)
MCHC: 34.3 g/dL (ref 30.0–36.0)
MCV: 87 fL (ref 78.0–100.0)
MCV: 86.7 fL (ref 78.0–100.0)
Platelets: 154 10*3/uL (ref 150–400)
Platelets: 160 10*3/uL (ref 150–400)
RBC: 2.92 MIL/uL — ABNORMAL LOW (ref 4.22–5.81)
RBC: 3.16 MIL/uL — AB (ref 4.22–5.81)
RDW: 13.5 % (ref 11.5–15.5)
RDW: 13.5 % (ref 11.5–15.5)
WBC: 9.8 10*3/uL (ref 4.0–10.5)
WBC: 9.9 10*3/uL (ref 4.0–10.5)

## 2013-07-06 LAB — PREPARE PLATELET PHERESIS: Unit division: 0

## 2013-07-06 LAB — CREATININE, SERUM: Creatinine, Ser: 0.83 mg/dL (ref 0.50–1.35)

## 2013-07-06 MED ORDER — FUROSEMIDE 10 MG/ML IJ SOLN
40.0000 mg | Freq: Once | INTRAMUSCULAR | Status: AC
  Administered 2013-07-06: 40 mg via INTRAVENOUS
  Filled 2013-07-06: qty 4

## 2013-07-06 MED ORDER — METOPROLOL TARTRATE 12.5 MG HALF TABLET
12.5000 mg | ORAL_TABLET | ORAL | Status: AC
  Filled 2013-07-06: qty 1

## 2013-07-06 MED ORDER — INSULIN ASPART 100 UNIT/ML ~~LOC~~ SOLN
0.0000 [IU] | SUBCUTANEOUS | Status: DC
  Administered 2013-07-06: 2 [IU] via SUBCUTANEOUS

## 2013-07-06 MED ORDER — METOPROLOL TARTRATE 25 MG PO TABS
25.0000 mg | ORAL_TABLET | Freq: Two times a day (BID) | ORAL | Status: DC
  Administered 2013-07-06 – 2013-07-07 (×2): 25 mg via ORAL
  Filled 2013-07-06 (×3): qty 1

## 2013-07-06 MED ORDER — LACTATED RINGERS IV SOLN: INTRAVENOUS | Status: DC

## 2013-07-06 MED ORDER — MORPHINE SULFATE 2 MG/ML IJ SOLN: 2.0000 mg | INTRAMUSCULAR | Status: DC | PRN

## 2013-07-06 NOTE — Progress Notes (Signed)
[facility] BRIEF SICU PROGRESS NOTE  1 Day Post-Op  S/P Procedure(s) (LRB): CORONARY ARTERY BYPASS GRAFTING (CABG) (N/A) INTRAOPERATIVE TRANSESOPHAGEAL ECHOCARDIOGRAM (N/A)   Stable day NSR/sinus tach w/ stable BP O2 sats 93-96% on 2 L/min UOP adequate  Plan: Continue current plan  Purcell NailsOWEN,CLARENCE H 07/06/2013 5:33 PM

## 2013-07-06 NOTE — Progress Notes (Addendum)
      [address]       [place], [id number]             [phone]        CARDIOTHORACIC SURGERY PROGRESS NOTE   R1 Day Post-Op Procedure(s) (LRB): CORONARY ARTERY BYPASS GRAFTING (CABG) (N/A) INTRAOPERATIVE TRANSESOPHAGEAL ECHOCARDIOGRAM (N/A)  Subjective: Looks good.  Mild soreness in chest.  Objective: Vital signs: BP Readings from Last 1 Encounters:  07/06/13 93/65   Pulse Readings from Last 1 Encounters:  07/06/13 108   Resp Readings from Last 1 Encounters:  07/06/13 8   Temp Readings from Last 1 Encounters:  07/06/13 99.1 F (37.3 C)     Hemodynamics: PAP: (24-50)/(13-39) 39/19 mmHg CO:  [4.3 L/min-7.3 L/min] 5.6 L/min CI:  [2.8 L/min/m2-7.3 L/min/m2] 2.8 L/min/m2  Physical Exam:  Rhythm:   sinus  Breath sounds: clear  Heart sounds:  RRR  Incisions:  Dressing dry, intact  Abdomen:  Soft, non-distended, non-tender  Extremities:  Warm, well-perfused    Intake/Output from previous day: 02/06 0701 - 02/07 0700 In: 7439.5 [I.V.:4599.5; Blood:1090; IV Piggyback:1750] Out: 6485 [Urine:4730; Blood:975; Chest Tube:780] Intake/Output this shift: Total I/O In: 176.3 [I.V.:176.3] Out: 230 [Urine:180; Chest Tube:50]  Lab Results:  CBC: Recent Labs  07/05/13 2000 07/05/13 2015 07/06/13 0413  WBC 13.7*  --  9.8  HGB 9.8* 9.9* 8.9*  HCT 28.8* 29.0* 25.4*  PLT 170  --  154    BMET:  Recent Labs  07/05/13 0305  07/05/13 2015 07/06/13 0413  NA 142  < > 140 140  K 3.7  < > 4.8 4.4  CL 102  --  103 105  CO2 26  --   --  24  GLUCOSE 95  < > 141* 117*  BUN 11  --  8 14  CREATININE 0.83  < > 0.80 0.84  CALCIUM 8.9  --   --  7.8*  < > = values in this interval not displayed.   CBG (last 3)   Recent Labs  07/05/13 2003 07/06/13 0033 07/06/13 0431  GLUCAP 117* 128* 122*    ABG    Component Value Date/Time   PHART 7.352 07/05/2013 2042   PCO2ART 44.1 07/05/2013 2042   PO2ART 163.0* 07/05/2013 2042   HCO3 24.1* 07/05/2013 2042     TCO2 25 07/05/2013 2042   ACIDBASEDEF 1.0 07/05/2013 2042   O2SAT 99.0 07/05/2013 2042    CXR: CLINICAL DATA: Postoperative evaluation  EXAM:  PORTABLE CHEST - 1 VIEW  COMPARISON: 07/05/2013  FINDINGS:  The endotracheal tube and nasogastric catheter been removed. A  Swan-Ganz catheter, mediastinal drain and left thoracostomy catheter  remain. The lungs are hypoinflated however no focal infiltrate is  seen. The cardiac shadow is enlarged.  IMPRESSION:  No acute abnormality noted.  Electronically Signed  By: Alcide CleverMark Lukens M.D.  On: 07/06/2013 07:19   Assessment/Plan: S/P Procedure(s) (LRB): CORONARY ARTERY BYPASS GRAFTING (CABG) (N/A) INTRAOPERATIVE TRANSESOPHAGEAL ECHOCARDIOGRAM (N/A)  Overall doing well POD1 Maintaining NSR w/ stable BP off all drips Hypertension Expected post op acute blood loss anemia, stable Expected post op atelectasis, mild Expected post op volume excess, mild   Mobilize  Increase metoprolol  D/C tubes and lines  Diuresis  Routine care   Dilpreet Faires H 07/06/2013 11:00 AM

## 2013-07-06 NOTE — Op Note (Signed)
James Yates, PEEPLES NO.:  [id number]  MEDICAL RECORD NO.:  [id number]  LOCATION:  2S12C                        FACILITY:  [facility]  PHYSICIAN:  [name], M.D.  DATE OF BIRTH:  [birth year]  DATE OF PROCEDURE:  07/05/2013 DATE OF DISCHARGE:                              OPERATIVE REPORT   OPERATION: 1. Coronary artery bypass grafting x4 (left internal mammary artery to     left anterior descending, saphenous vein graft to posterior     descending, sequential saphenous vein graft to obtuse marginal 1     and obtuse marginal 3). 2. Endoscopic harvest of right leg greater saphenous vein.  PREOPERATIVE DIAGNOSES:  Severe multivessel coronary artery disease, left main stenosis, unstable angina.  POSTOPERATIVE DIAGNOSES:  Severe multivessel coronary artery disease, left main stenosis, unstable angina.  SURGEON:  [name], MD  ASSISTANT:  Jari Pigg PA-C  ANESTHESIA:  General by Dr. Michaela Corner.  INDICATIONS:  The patient is a 58 year old, Hispanic male, transferred from [facility] after cardiac catheterization was performed there showing severe multivessel CAD and cardiac surgical revascularization was recommended.  He presented with unstable angina without prior cardiac history.  He was placed on heparin and remained pain free.  He did receive a load of Plavix, 300 mg orally, before his cardiac catheterization.  The plan after transfer was to support the patient, to allow Plavix washout, then perform the same hospitalization surgical coronary revascularization.  Prior to surgery, I discussed the results of the cardiac catheterization and the plan for CABG with the patient using his niece as a Nurse, learning disability. I discussed the major aspects of surgery, the expected hospital recovery, and the alternative therapies available.  I discussed through his translator-niece the potential complications including the risks of bleeding, but since he was on  Plavix, although he received 1 dose, the risk of blood transfusion requirement, stroke, MI, arrhythmia, pleural effusion, infection, and death.  After this review, he demonstrated his understanding and provided informed consent.  OPERATIVE FINDINGS: 1. Intramyocardial LAD, severe to diffuse CAD. 2. Coagulopathy following reversal of heparin with protamine requiring     platelets.  OPERATIVE PROCEDURE:  The patient was brought to the operating room and placed supine on the operating table where general anesthesia was induced under invasive hemodynamic monitoring.  The chest, abdomen, and legs were prepped with Betadine and draped as a sterile field.  A proper time-out was performed.  A sternal incision was made as the saphenous vein was harvested endoscopically from the right leg.  The left internal mammary artery was harvested as a pedicle graft from its origin up to the subclavian vessels.  It was a good vessel with good flow.  A sternal retractor was placed and the pericardium was opened and suspended.  Heparin was administered and pursestrings were placed in the ascending aorta and right atrium.  The patient was cannulated and placed on cardiopulmonary bypass after the ACT was documented as being therapeutic.  The coronary arteries were identified for grafting.  The LAD was grafted fairly distally because it became deeply intramyocardial just proximal to the anastomotic site.  The OM2 is a small vessel but graftable.  There was some mild  scarring in the lateral wall. Transesophageal echocardiogram demonstrated probably a bicuspid aortic valve but without aortic stenosis with a mean gradient of 4 mmHg.  The saphenous vein and mammary artery were prepared for the distal anastomoses.  The cardioplegia cannulas were placed for both antegrade and retrograde cold blood cardioplegia.  The patient was cooled to 32 degrees and aortic crossclamp was applied.  A 1 L of cold  blood cardioplegia was delivered in split doses between the antegrade aortic and retrograde coronary sinus catheters.  There was good cardioplegic arrest and septal temperature dropped less than 12 degrees. Cardioplegia was delivered every 20 minutes.  The distal coronary anastomoses were performed.  The first distal anastomosis was to the distal posterior descending.  It had a proximal 70% stenosis.  A reverse saphenous vein was sewn end-to-side with running 7-0 Prolene.  There was good flow through the graft. Cardioplegia was redosed.  The second anastomosis consisted of the sequential vein graft from the OM1 to the OM3.  The OM1 was a 1.7-mm vessel with proximal 95% stenosis. The vein was sewn side-to-side with running 7-0 Prolene, and there was good flow through the graft.  A third distal anastomosis was the continuation of the sequential vein graft to the OM3.  This was a smaller 1.2-mm vessel with proximal 99% stenosis.  The end of vein was sewn end-to-side with running 7-0 Prolene with good flow through the graft.  Cardioplegia was redosed.  The fourth distal anastomosis was to the distal third of the LAD.  It had a proximal 90% stenosis.  The left IMA pedicle was brought through an opening created in the left lateral pericardium, was brought down onto the LAD and sewn end-to-side with running 8-0 Prolene.  There was good flow through the anastomosis after briefly releasing the pedicle clamp on the mammary pedicle.  The bulldog was reapplied and the pedicle was secured to the epicardium with 6-0 Prolene.  Cardioplegia was redosed.  After the cross-clamp was still in place, 2 proximal vein anastomoses were performed using a 4.5-mm punch running 6-0 Prolene.  Prior to tying down the final proximal anastomosis, air was vented from the coronaries with a dose of retrograde warm blood cardioplegia.  The cross-clamp was removed.  The heart was cardioverted back to a regular  rhythm.  The bypass grafts were de-aired and opened and each had good flow and hemostasis was documented at the proximal distal sites.  The cardioplegia cannula was removed.  Temporary pacing wires were applied and the patient was being rewarmed.  The lungs were then re-expanded and ventilator was resumed. The patient was weaned off from cardiopulmonary bypass without difficulty without inotropes.  Echo showed good global LV function. There were no new changes on echo.  Protamine was administered without adverse reaction.  The cannulae were removed.  There was still diffuse coagulopathy consistent with his Plavix load 72 hours previously, so the patient was given a 1 unit of platelets with improved coagulation.  The superior pericardial fat was closed over the aorta.  The patient remained hemodynamically stable.  An anterior mediastinal and left pleural chest tube were placed and brought out through separate incisions.  The sternum was closed with wire.  The pectoralis fascia was closed with a running #1 Vicryl.  The subcutaneous and skin layers were closed with running Vicryl and sterile dressings were applied.  Total cardiopulmonary bypass time was 135 minutes.     [name], M.D.     PV/MEDQ  D:  07/05/2013  T:  07/06/2013  Job:  [id number]  cc:   Lorine BearsMuhammad Arida, MD

## 2013-07-07 ENCOUNTER — Inpatient Hospital Stay (HOSPITAL_COMMUNITY): Payer: Medicaid Other

## 2013-07-07 LAB — CBC
HCT: 24.6 % — ABNORMAL LOW (ref 39.0–52.0)
Hemoglobin: 8.3 g/dL — ABNORMAL LOW (ref 13.0–17.0)
MCH: 29.5 pg (ref 26.0–34.0)
MCHC: 33.7 g/dL (ref 30.0–36.0)
MCV: 87.5 fL (ref 78.0–100.0)
PLATELETS: 128 10*3/uL — AB (ref 150–400)
RBC: 2.81 MIL/uL — ABNORMAL LOW (ref 4.22–5.81)
RDW: 13.6 % (ref 11.5–15.5)
WBC: 8.2 10*3/uL (ref 4.0–10.5)

## 2013-07-07 LAB — BASIC METABOLIC PANEL
BUN: 17 mg/dL (ref 6–23)
CALCIUM: 7.9 mg/dL — AB (ref 8.4–10.5)
CO2: 26 meq/L (ref 19–32)
Chloride: 101 mEq/L (ref 96–112)
Creatinine, Ser: 0.82 mg/dL (ref 0.50–1.35)
GFR calc Af Amer: >90 mL/min (ref >90)
GLUCOSE: 112 mg/dL — AB (ref 70–99)
Potassium: 3.8 mEq/L (ref 3.7–5.3)
Sodium: 139 mEq/L (ref 137–147)

## 2013-07-07 LAB — GLUCOSE, CAPILLARY
Glucose-Capillary: 98 mg/dL (ref 70–99)
Glucose-Capillary: 111 mg/dL — ABNORMAL HIGH (ref 70–99)

## 2013-07-07 MED ORDER — POTASSIUM CHLORIDE CRYS ER 20 MEQ PO TBCR
20.0000 meq | EXTENDED_RELEASE_TABLET | Freq: Every day | ORAL | Status: DC
  Administered 2013-07-07 – 2013-07-11 (×5): 20 meq via ORAL
  Filled 2013-07-07 (×5): qty 1

## 2013-07-07 MED ORDER — SODIUM CHLORIDE 0.9 % IJ SOLN: 3.0000 mL | INTRAMUSCULAR | Status: DC | PRN

## 2013-07-07 MED ORDER — METOPROLOL TARTRATE 50 MG PO TABS
50.0000 mg | ORAL_TABLET | Freq: Two times a day (BID) | ORAL | Status: DC
  Administered 2013-07-07 – 2013-07-08 (×3): 50 mg via ORAL
  Filled 2013-07-07 (×5): qty 1

## 2013-07-07 MED ORDER — MOVING RIGHT ALONG BOOK
Freq: Once | Status: AC
Start: 2013-07-07 — End: 2013-07-07
  Administered 2013-07-07: 1
  Filled 2013-07-07: qty 1

## 2013-07-07 MED ORDER — TRAMADOL HCL 50 MG PO TABS
50.0000 mg | ORAL_TABLET | ORAL | Status: DC | PRN
  Administered 2013-07-08: 100 mg via ORAL
  Administered 2013-07-09 (×2): 50 mg via ORAL
  Administered 2013-07-10 – 2013-07-11 (×7): 100 mg via ORAL
  Filled 2013-07-07 (×2): qty 2
  Filled 2013-07-07: qty 1
  Filled 2013-07-07: qty 2
  Filled 2013-07-07: qty 1
  Filled 2013-07-07 (×3): qty 2
  Filled 2013-07-07 (×2): qty 1
  Filled 2013-07-07: qty 2

## 2013-07-07 MED ORDER — SODIUM CHLORIDE 0.9 % IJ SOLN
3.0000 mL | Freq: Two times a day (BID) | INTRAMUSCULAR | Status: DC
  Administered 2013-07-07 – 2013-07-09 (×5): 3 mL via INTRAVENOUS

## 2013-07-07 MED ORDER — SODIUM CHLORIDE 0.9 % IV SOLN: 250.0000 mL | INTRAVENOUS | Status: DC | PRN

## 2013-07-07 MED ORDER — FUROSEMIDE 40 MG PO TABS
40.0000 mg | ORAL_TABLET | Freq: Every day | ORAL | Status: DC
  Administered 2013-07-07 – 2013-07-11 (×5): 40 mg via ORAL
  Filled 2013-07-07 (×5): qty 1

## 2013-07-07 NOTE — Progress Notes (Signed)
      [address]       [place] [id number]             [phone]        CARDIOTHORACIC SURGERY PROGRESS NOTE   R2 Days Post-Op Procedure(s) (LRB): CORONARY ARTERY BYPASS GRAFTING (CABG) (N/A) INTRAOPERATIVE TRANSESOPHAGEAL ECHOCARDIOGRAM (N/A)  Subjective: Looks good.  Mild soreness in chest.  Ambulating well.  No SOB.  Eating fairly well.  Objective: Vital signs: BP Readings from Last 1 Encounters:  07/07/13 132/78   Pulse Readings from Last 1 Encounters:  07/07/13 119   Resp Readings from Last 1 Encounters:  07/07/13 34   Temp Readings from Last 1 Encounters:  07/07/13 98.7 F (37.1 C) Oral    Hemodynamics: PAP: (40-43)/(23) 40/23 mmHg  Physical Exam:  Rhythm:   Sinus tach  Breath sounds: clear  Heart sounds:  RRR  Incisions:  Dressing dry, intact  Abdomen:  Soft, non-distended, non-tender  Extremities:  Warm, well-perfused    Intake/Output from previous day: 02/07 0701 - 02/08 0700 In: 1282.3 [P.O.:600; I.V.:582.3; IV Piggyback:100] Out: 2205 [Urine:2135; Chest Tube:70] Intake/Output this shift: Total I/O In: 420 [P.O.:420] Out: 200 [Urine:200]  Lab Results:  CBC: Recent Labs  07/06/13 1640 07/06/13 1642 07/07/13 0400  WBC 9.9  --  8.2  HGB 9.4* 10.2* 8.3*  HCT 27.4* 30.0* 24.6*  PLT 160  --  128*    BMET:  Recent Labs  07/06/13 0413  07/06/13 1642 07/07/13 0400  NA 140  --  138 139  K 4.4  --  4.1 3.8  CL 105  --  104 101  CO2 24  --   --  26  GLUCOSE 117*  --  149* 112*  BUN 14  --  16 17  CREATININE 0.84  < > 0.90 0.82  CALCIUM 7.8*  --   --  7.9*  < > = values in this interval not displayed.   CBG (last 3)   Recent Labs  07/06/13 2026 07/07/13 0400 07/07/13 0729  GLUCAP 131* 98 111*    ABG    Component Value Date/Time   PHART 7.352 07/05/2013 2042   PCO2ART 44.1 07/05/2013 2042   PO2ART 163.0* 07/05/2013 2042   HCO3 24.1* 07/05/2013 2042   TCO2 23 07/06/2013 1642   ACIDBASEDEF 1.0 07/05/2013 2042   O2SAT 99.0 07/05/2013 2042    CXR: CLINICAL DATA: Status post cardiac surgery  EXAM:  PORTABLE CHEST - 1 VIEW  COMPARISON: 07/06/2013  FINDINGS:  Swan-Ganz catheter is been removed. A right jugular sheath remains.  The mediastinal drain and left thoracostomy catheter have been  removed. No pneumothorax is noted. Mild density is noted in the left  base likely related to a component of atelectasis and small  effusion. The right lung remains clear.  IMPRESSION:  Left basilar changes.  Electronically Signed  By: Alcide CleverMark Lukens M.D.  On: 07/07/2013 07:49   Assessment/Plan: S/P Procedure(s) (LRB): CORONARY ARTERY BYPASS GRAFTING (CABG) (N/A) INTRAOPERATIVE TRANSESOPHAGEAL ECHOCARDIOGRAM (N/A)  Overall doing well POD2 Maintaining NSR-sinus tach w/ stable BP  Hypertension  Expected post op acute blood loss anemia, slightly worse Expected post op atelectasis, mild  Expected post op volume excess, mild   Mobilize  Increase metoprolol  Diuresis Transfer 2W  Routine care   OWEN,CLARENCE H 07/07/2013 10:53 AM

## 2013-07-08 ENCOUNTER — Encounter (HOSPITAL_COMMUNITY): Payer: Self-pay | Admitting: Cardiothoracic Surgery

## 2013-07-08 ENCOUNTER — Inpatient Hospital Stay (HOSPITAL_COMMUNITY): Payer: Medicaid Other

## 2013-07-08 LAB — CBC
HCT: 25.4 % — ABNORMAL LOW (ref 39.0–52.0)
Hemoglobin: 8.5 g/dL — ABNORMAL LOW (ref 13.0–17.0)
MCH: 29.7 pg (ref 26.0–34.0)
MCHC: 33.5 g/dL (ref 30.0–36.0)
MCV: 88.8 fL (ref 78.0–100.0)
PLATELETS: 161 10*3/uL (ref 150–400)
RBC: 2.86 MIL/uL — ABNORMAL LOW (ref 4.22–5.81)
RDW: 13.9 % (ref 11.5–15.5)
WBC: 7.6 10*3/uL (ref 4.0–10.5)

## 2013-07-08 LAB — BASIC METABOLIC PANEL
BUN: 15 mg/dL (ref 6–23)
CALCIUM: 7.9 mg/dL — AB (ref 8.4–10.5)
CO2: 26 mEq/L (ref 19–32)
Chloride: 105 mEq/L (ref 96–112)
Creatinine, Ser: 0.86 mg/dL (ref 0.50–1.35)
GFR calc Af Amer: >90 mL/min (ref >90)
GFR calc non Af Amer: >90 mL/min (ref >90)
GLUCOSE: 115 mg/dL — AB (ref 70–99)
Potassium: 4.3 mEq/L (ref 3.7–5.3)
Sodium: 142 mEq/L (ref 137–147)

## 2013-07-08 LAB — GLUCOSE, CAPILLARY
GLUCOSE-CAPILLARY: 101 mg/dL — AB (ref 70–99)
GLUCOSE-CAPILLARY: 113 mg/dL — AB (ref 70–99)
Glucose-Capillary: 109 mg/dL — ABNORMAL HIGH (ref 70–99)

## 2013-07-08 MED FILL — Heparin Sodium (Porcine) Inj 1000 Unit/ML: INTRAMUSCULAR | Qty: 30 | Status: AC

## 2013-07-08 MED FILL — Potassium Chloride Inj 2 mEq/ML: INTRAVENOUS | Qty: 40 | Status: AC

## 2013-07-08 MED FILL — Magnesium Sulfate Inj 50%: INTRAMUSCULAR | Qty: 10 | Status: AC

## 2013-07-08 MED FILL — Electrolyte-R (PH 7.4) Solution: INTRAVENOUS | Qty: 6000 | Status: AC

## 2013-07-08 MED FILL — Sodium Bicarbonate IV Soln 8.4%: INTRAVENOUS | Qty: 50 | Status: AC

## 2013-07-08 MED FILL — Mannitol IV Soln 20%: INTRAVENOUS | Qty: 500 | Status: AC

## 2013-07-08 MED FILL — Sodium Chloride IV Soln 0.9%: INTRAVENOUS | Qty: 2000 | Status: AC

## 2013-07-08 MED FILL — Lidocaine HCl IV Inj 20 MG/ML: INTRAVENOUS | Qty: 10 | Status: AC

## 2013-07-08 NOTE — Progress Notes (Addendum)
[address]       [place] [id number]             [phone]      3 Days Post-Op  Procedure(s) (LRB): CORONARY ARTERY BYPASS GRAFTING (CABG) (N/A) INTRAOPERATIVE TRANSESOPHAGEAL ECHOCARDIOGRAM (N/A) Subjective: Still fairly weak, sore, mild nausea   Objective  Telemetry sinus tachy, pac's  Temp:  [98.5 F (36.9 C)-100 F (37.8 C)] 100 F (37.8 C) (02/09 0421) Pulse Rate:  [114-130] 124 (02/09 0421) Resp:  [8-41] 20 (02/09 0421) BP: (109-127)/(76-82) 115/76 mmHg (02/09 0421) SpO2:  [90 %-97 %] 97 % (02/09 0421) Weight:  [208 lb 3.2 oz (94.439 kg)] 208 lb 3.2 oz (94.439 kg) (02/09 0500)   Intake/Output Summary (Last 24 hours) at 07/08/13 0947 Last data filed at 07/07/13 2100  Gross per 24 hour  Intake    300 ml  Output    190 ml  Net    110 ml       General appearance: alert, cooperative and no distress Heart: regular rate and rhythm Lungs: dim in bases Abdomen: soft, non-tender Extremities: min edema Wound: incis -dressings CDI  Lab Results:  Recent Labs  07/06/13 0413 07/06/13 1640  07/07/13 0400 07/08/13 0440  NA 140  --   < > 139 142  K 4.4  --   < > 3.8 4.3  CL 105  --   < > 101 105  CO2 24  --   --  26 26  GLUCOSE 117*  --   < > 112* 115*  BUN 14  --   < > 17 15  CREATININE 0.84 0.83  < > 0.82 0.86  CALCIUM 7.8*  --   --  7.9* 7.9*  MG 2.3 2.3  --   --   --   < > = values in this interval not displayed. No results found for this basename: AST, ALT, ALKPHOS, BILITOT, PROT, ALBUMIN,  in the last 72 hours No results found for this basename: LIPASE, AMYLASE,  in the last 72 hours  Recent Labs  07/07/13 0400 07/08/13 0440  WBC 8.2 7.6  HGB 8.3* 8.5*  HCT 24.6* 25.4*  MCV 87.5 88.8  PLT 128* 161   No results found for this basename: CKTOTAL, CKMB, TROPONINI,  in the last 72 hours No components found with this basename: POCBNP,  No results found for this basename: DDIMER,  in the last 72 hours No results found for  this basename: HGBA1C,  in the last 72 hours No results found for this basename: CHOL, HDL, LDLCALC, TRIG, CHOLHDL,  in the last 72 hours No results found for this basename: TSH, T4TOTAL, FREET3, T3FREE, THYROIDAB,  in the last 72 hours No results found for this basename: VITAMINB12, FOLATE, FERRITIN, TIBC, IRON, RETICCTPCT,  in the last 72 hours  Medications: Scheduled . acetaminophen  1,000 mg Oral Q6H  . aspirin EC  325 mg Oral Daily  . atorvastatin  80 mg Oral q1800  . bisacodyl  10 mg Oral Daily   Or  . bisacodyl  10 mg Rectal Daily  . docusate sodium  200 mg Oral Daily  . furosemide  40 mg Oral Daily  . metoprolol tartrate  50 mg Oral BID  . pantoprazole  40 mg Oral Daily  . potassium chloride  20 mEq Oral Daily  . sodium chloride  3 mL Intravenous Q12H     Radiology/Studies:  Dg Chest 2 View  07/08/2013  CLINICAL DATA:  Post CABG, shortness of breath, followup atelectasis  EXAM: CHEST  2 VIEW  COMPARISON:  07/07/2013  FINDINGS: Enlargement of cardiac silhouette post CABG.  Mediastinal contours and pulmonary vascularity normal.  Low lung volumes with bibasilar atelectasis and subpulmonic effusions.  Upper lungs clear.  No segmental consolidation or pneumothorax.  IMPRESSION: Low lung volumes with bibasilar atelectasis and small bilateral subpulmonic pleural effusions.   Electronically Signed   By: Ulyses Southward M.D.   On: 07/08/2013 07:50   Dg Chest Port 1 View  07/07/2013   CLINICAL DATA:  Status post cardiac surgery  EXAM: PORTABLE CHEST - 1 VIEW  COMPARISON:  07/06/2013  FINDINGS: Swan-Ganz catheter is been removed. A right jugular sheath remains. The mediastinal drain and left thoracostomy catheter have been removed. No pneumothorax is noted. Mild density is noted in the left base likely related to a component of atelectasis and small effusion. The right lung remains clear.  IMPRESSION: Left basilar changes.   Electronically Signed   By: Alcide Clever M.D.   On: 07/07/2013 07:49     INR: Will add last result for INR, ABG once components are confirmed Will add last 4 CBG results once components are confirmed  Assessment/Plan: S/P Procedure(s) (LRB): CORONARY ARTERY BYPASS GRAFTING (CABG) (N/A) INTRAOPERATIVE TRANSESOPHAGEAL ECHOCARDIOGRAM (N/A)  1 steady progress 2 Tachy- HR 120s, not sure he would tol increase in lopressor 50 BID- will monitor for now 3 cont gentle diuresis 4 labs stable  5 push pulm toilet/rehab as able   LOS: 6 days    GOLD,WAYNE E 2/9/20159:47 AM   Chart reviewed, patient examined, agree with above. CXR shows bibasilar atelectasis and small effusions. Continue IS, ambulation and diuresis. I agree that his BP will probably not tolerate increased lopressor at this time.

## 2013-07-08 NOTE — Progress Notes (Signed)
Pt sts he feels poorly today with increased HR. I came at 1030 and he wanted to wait till after lunch. When I returned at 1400 he sts that he tried to walk with his son and that he felt weak and dizzy. RN assisted him back. Now he would like to rest. Will f/u at 1500.  Ethelda ChickKristan Marirose Deveney CES, ACSM 2:15 PM 07/08/2013

## 2013-07-08 NOTE — Progress Notes (Signed)
CARDIAC REHAB PHASE I   PRE:  Rate/Rhythm: 116 ST    BP: sitting 134/90    SaO2: 97 2L  MODE:  Ambulation: 350 ft   POST:  Rate/Rhythm: 125 ST    BP: sitting 110/80     SaO2: 94 2L  Pt has felt bad and has been anxious. Walked on O2 for comfort, used RW. Slow, steady. HR up to 125 ST. Much more controlled than this am apparently. To recliner after walk. BP was elevated before walk, lower after walk. Did c/o some dizziness, not as bad as in am. Encouraged pt to walk with staff. [phone]   Elissa LovettReeve, Shlok Raz Blodgett MillsKristan CES, ACSM 07/08/2013 3:33 PM

## 2013-07-09 LAB — TYPE AND SCREEN
ABO/RH(D): B POS
Antibody Screen: NEGATIVE
Unit division: 0
Unit division: 0

## 2013-07-09 LAB — GLUCOSE, CAPILLARY
GLUCOSE-CAPILLARY: 118 mg/dL — AB (ref 70–99)
Glucose-Capillary: 96 mg/dL (ref 70–99)
Glucose-Capillary: 158 mg/dL — ABNORMAL HIGH (ref 70–99)
Glucose-Capillary: 113 mg/dL — ABNORMAL HIGH (ref 70–99)
Glucose-Capillary: 111 mg/dL — ABNORMAL HIGH (ref 70–99)

## 2013-07-09 MED ORDER — DIGOXIN 125 MCG PO TABS
0.1250 mg | ORAL_TABLET | Freq: Every day | ORAL | Status: DC
  Administered 2013-07-09 – 2013-07-11 (×3): 0.125 mg via ORAL
  Filled 2013-07-09 (×3): qty 1

## 2013-07-09 MED ORDER — GUAIFENESIN ER 600 MG PO TB12
600.0000 mg | ORAL_TABLET | Freq: Two times a day (BID) | ORAL | Status: DC
  Administered 2013-07-09 – 2013-07-11 (×5): 600 mg via ORAL
  Filled 2013-07-09 (×6): qty 1

## 2013-07-09 MED ORDER — METOPROLOL TARTRATE 50 MG PO TABS
75.0000 mg | ORAL_TABLET | Freq: Two times a day (BID) | ORAL | Status: DC
  Administered 2013-07-09 – 2013-07-11 (×5): 75 mg via ORAL
  Filled 2013-07-09 (×6): qty 1

## 2013-07-09 NOTE — Progress Notes (Addendum)
CARDIAC REHAB PHASE I   PRE:  Rate/Rhythm: 107 ST  BP:  Supine:   Sitting: 110/70  Standing:    SaO2: 99 2L 97 RA  MODE:  Ambulation: 500 ft   POST:  Rate/Rhythm: 110 ST  BP:  Supine:   Sitting: 110/70  Standing:    SaO2: 97 RA 1115-1155 On arrival pt on O2 2L sat 99%. O2 discontinued room air sat 97%. Assisted X 1 and sued walker to ambulate. Gait steady with walker. Pt able to walk 500 feet without c/o. Pt back to recliner after walk with call light in reach. Pt sweaty before walk linen changed in recliner and gown. , pt c/o of feeling cold.  Melina CopaLisa Jiovanna Frei RN 07/09/2013 11:42 AM

## 2013-07-09 NOTE — Progress Notes (Addendum)
       [address]       [place] [id number]             [phone]          4 Days Post-Op Procedure(s) (LRB): CORONARY ARTERY BYPASS GRAFTING (CABG) (N/A) INTRAOPERATIVE TRANSESOPHAGEAL ECHOCARDIOGRAM (N/A)  Subjective: Coughing a lot this am, mostly nonproductive.  Quite sore from cough.   Objective: Vital signs in last 24 hours: Patient Vitals for the past 24 hrs:  BP Temp Temp src Pulse Resp SpO2 Weight  07/09/13 0514 121/77 mmHg 98.1 F (36.7 C) Oral 103 18 100 % 205 lb 14.6 oz (93.4 kg)  07/08/13 2046 129/71 mmHg 98.8 F (37.1 C) Oral 112 18 100 % -  07/08/13 1500 117/72 mmHg 98.2 F (36.8 C) Oral 117 19 94 % -   Current Weight  07/09/13 205 lb 14.6 oz (93.4 kg)  PRE-OPERATIVE WEIGHT: 90 kg     Intake/Output from previous day: 02/09 0701 - 02/10 0700 In: 480 [P.O.:480] Out: -   CBGs [id number]     PHYSICAL EXAM:  Heart: RRR Lungs: Decreased BS in bases Wound: Clean and dry Extremities: Mild LE edema    Lab Results: CBC: Recent Labs  07/07/13 0400 07/08/13 0440  WBC 8.2 7.6  HGB 8.3* 8.5*  HCT 24.6* 25.4*  PLT 128* 161   BMET:  Recent Labs  07/07/13 0400 07/08/13 0440  NA 139 142  K 3.8 4.3  CL 101 105  CO2 26 26  GLUCOSE 112* 115*  BUN 17 15  CREATININE 0.82 0.86  CALCIUM 7.9* 7.9*    PT/INR: No results found for this basename: LABPROT, INR,  in the last 72 hours    Assessment/Plan: S/P Procedure(s) (LRB): CORONARY ARTERY BYPASS GRAFTING (CABG) (N/A) INTRAOPERATIVE TRANSESOPHAGEAL ECHOCARDIOGRAM (N/A) CV- still mildly tachy, 110s. BPs trending up. Will increase Lopressor to 75 mg bid and monitor. Expected postop blood loss anemia- H/H stable.  Vol overload- diurese. Pulm- continue pulm toilet, wean O2 as tolerated. Will add Mucinex. CRPI.     LOS: 7 days    COLLINS,GINA H 07/09/2013  Will add digoxin for persistent tachycardia Needs more diuresis and conditioning before discharge  home  patient examined and medical record reviewed,agree with above note. VAN TRIGT III,PETER 07/09/2013

## 2013-07-10 LAB — GLUCOSE, CAPILLARY
GLUCOSE-CAPILLARY: 102 mg/dL — AB (ref 70–99)
Glucose-Capillary: 102 mg/dL — ABNORMAL HIGH (ref 70–99)
Glucose-Capillary: 104 mg/dL — ABNORMAL HIGH (ref 70–99)
Glucose-Capillary: 119 mg/dL — ABNORMAL HIGH (ref 70–99)
Glucose-Capillary: 135 mg/dL — ABNORMAL HIGH (ref 70–99)
Glucose-Capillary: 175 mg/dL — ABNORMAL HIGH (ref 70–99)

## 2013-07-10 MED ORDER — FE FUMARATE-B12-VIT C-FA-IFC PO CAPS
1.0000 | ORAL_CAPSULE | Freq: Three times a day (TID) | ORAL | Status: DC
  Administered 2013-07-10 – 2013-07-11 (×5): 1 via ORAL
  Filled 2013-07-10 (×7): qty 1

## 2013-07-10 MED ORDER — LISINOPRIL 5 MG PO TABS
5.0000 mg | ORAL_TABLET | Freq: Every day | ORAL | Status: DC
  Administered 2013-07-10 – 2013-07-11 (×2): 5 mg via ORAL
  Filled 2013-07-10 (×3): qty 1

## 2013-07-10 NOTE — Discharge Summary (Signed)
[address]       [place] [address]             [phone]       James Yates 01/23/1956 58 y.o. [id number]  07/02/2013   James Perna, MD  MI CAD  History of Present Illness:  James Yates is a 58 yo Hispanic male who primarily speaks Spanish. He presented to [facility] with complaints of intermittent dull chest pain that has been occurring over the past month. However, over the past week the patient noticed the pain had become exertional with radiation into his back and neck with increasing severity. He denies associated dyspnea, diaphoresis, and nausea. Workup in the ED revealed no significant EKG changes and an elevated Troponin. He was ruled in for NSTEMI, started on a Heparin drip, administered ASA and 300 mg Plavix. Cardiology admitted patient chest pain free. He underwent Cardiac catheterization which revealed 3 vessel CAD with no LM involvement as well as a preserved EF of 60%. Echocardiogram was also obtained which did not reveal evidence of significant valvular disease. It was felt patient wound benefit from Coronary Bypass procedure, resulting in transfer to [facility] for further care. The patient is currently chest pain free. He remains on a Heparin drip.    Past Medical History   Diagnosis  Date   .  Medical history non-contributory     Past Surgical History   Procedure  Laterality  Date   .  No past surgeries      History   Smoking status   .  Never Smoker   Smokeless tobacco   .  Not on file    History   Alcohol Use  No    History    Social History   .  Marital Status:  Married     Spouse Name:  N/A     Number of Children:  N/A   .  Years of Education:  N/A    Occupational History   .  Not on file.    Social History Main Topics   .  Smoking status:  Never Smoker   .  Smokeless tobacco:  Not on file   .  Alcohol Use:  No   .  Drug Use:  No   .  Sexual Activity:  Not on file    Other Topics  Concern   .  Not  on file    Social History Narrative   .  No narrative on file    No Known Allergies  Current Facility-Administered Medications   Medication  Dose  Route  Frequency  Provider  Last Rate  Last Dose   .  0.9 % sodium chloride infusion  250 mL  Intravenous  PRN  Roger A Arguello, PA-C     .  acetaminophen (TYLENOL) tablet 650 mg  650 mg  Oral  Q4H PRN  Roger A Arguello, PA-C     .  ALPRAZolam Prudy Feeler) tablet 0.25 mg  0.25 mg  Oral  BID PRN  Gery Pray, PA-C     .  aspirin EC tablet 325 mg  325 mg  Oral  Daily  Roger A Arguello, PA-C   325 mg at 07/03/13 1022   .  atorvastatin (LIPITOR) tablet 80 mg  80 mg  Oral  q1800  Roger A Arguello, PA-C     .  heparin ADULT infusion 100 units/mL (25000 units/250 mL)  1,200 Units/hr  Intravenous  Continuous  Lewayne BuntingBrian S Crenshaw, MD  12 mL/hr at 07/02/13 2150  1,200 Units/hr at 07/02/13 2150   .  lisinopril (PRINIVIL,ZESTRIL) tablet 10 mg  10 mg  Oral  Daily  Roger A Arguello, PA-C   10 mg at 07/03/13 1022   .  metoprolol tartrate (LOPRESSOR) tablet 25 mg  25 mg  Oral  BID  Roger A Arguello, PA-C   25 mg at 07/03/13 1022   .  morphine 2 MG/ML injection 2 mg  2 mg  Intravenous  Q4H PRN  Roger A Arguello, PA-C     .  nitroGLYCERIN (NITROSTAT) SL tablet 0.4 mg  0.4 mg  Sublingual  Q5 Min x 3 PRN  Roger A Arguello, PA-C     .  ondansetron (ZOFRAN) injection 4 mg  4 mg  Intravenous  Q6H PRN  Roger A Arguello, PA-C     .  pantoprazole (PROTONIX) EC tablet 40 mg  40 mg  Oral  Q0600  Roger A Arguello, PA-C   40 mg at 07/03/13 0550   .  sodium chloride 0.9 % injection 3 mL  3 mL  Intravenous  Q12H  Roger A Arguello, PA-C     .  sodium chloride 0.9 % injection 3 mL  3 mL  Intravenous  PRN  Roger A Arguello, PA-C     .  zolpidem (AMBIEN) tablet 5 mg  5 mg  Oral  QHS PRN,MR X 1  Roger A Arguello, PA-C      Prescriptions prior to admission   Medication  Sig  Dispense  Refill   .  ibuprofen (ADVIL,MOTRIN) 100 MG tablet  Take 200 mg by mouth every 6 (six) hours as needed  for pain.      No family history on file.    Hospital Course:  The patient remained medically stable and surgery was scheduled. On 07/05/2013 he was taken the operating room at which time the following procedure was performed: DATE OF PROCEDURE: 07/05/2013  DATE OF DISCHARGE:  OPERATIVE REPORT  OPERATION:  1. Coronary artery bypass grafting x4 (left internal mammary artery to  left anterior descending, saphenous vein graft to posterior  descending, sequential saphenous vein graft to obtuse marginal 1  and obtuse marginal 3).  2. Endoscopic harvest of right leg greater saphenous vein.  PREOPERATIVE DIAGNOSES: Severe multivessel coronary artery disease,  left main stenosis, unstable angina.  POSTOPERATIVE DIAGNOSES: Severe multivessel coronary artery disease,  left main stenosis, unstable angina.  SURGEON: James PernaPeter Van Trigt, MD  ASSISTANT: Jari PiggGina Collis PA-C  ANESTHESIA: General by Dr. Michaela CornerKrish Moser The patient tolerated the procedure well was taken to the surgical intensive care unit in stable condition  Postoperative hospital course: Overall the patient has progressed nicely. He has remained hemodynamically stable. He was wean from the ventilator without difficulty and has remained neurologically intact. He has an expected acute blood loss anemia which has stabilized. He has some volume overload which has responded well to diuretics. Oxygen has been weaned and he maintains good saturations on room air. He has had no significant postoperative dysrhythmias. He has had some sinus tachycardia and beta blocker has been adjusted. Incisions are noted to be healing well without evidence of infection. He is tolerating gradually increasing activities using standard postoperative rehabilitation protocols. His overall status is felt to be quite stable for discharge.   Recent Labs  07/08/13 0440  NA 142  K 4.3  CL 105  CO2 26  GLUCOSE 115*  BUN  15  CALCIUM 7.9*    Recent Labs  07/08/13 0440    WBC 7.6  HGB 8.5*  HCT 25.4*  PLT 161   No results found for this basename: INR,  in the last 72 hours   Discharge Instructions:  The patient is discharged to home with extensive instructions on wound care and progressive ambulation.  They are instructed not to drive or perform any heavy lifting until returning to see the physician in his office.  Discharge Diagnosis:  MI CAD  Secondary Diagnosis: Patient Active Problem List   Diagnosis Date Noted  . 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 CAD (coronary artery disease), native coronary artery 07/03/2013  . NSTEMI (non-ST elevated myocardial infarction) 07/02/2013   Past Medical History  Diagnosis Date  . Medical history non-contributory      Medications at time of discharge:    Medication List         aspirin 325 MG EC tablet  Take 1 tablet (325 mg total) by mouth daily.     atorvastatin 80 MG tablet  Commonly known as:  LIPITOR  Take 1 tablet (80 mg total) by mouth daily at 6 PM.     digoxin 0.125 MG tablet  Commonly known as:  LANOXIN  Take 1 tablet (0.125 mg total) by mouth daily.     ferrous fumarate-b12-vitamic C-folic acid capsule  Commonly known as:  TRINSICON / FOLTRIN  Take 1 capsule by mouth 3 (three) times daily after meals.     furosemide 40 MG tablet  Commonly known as:  LASIX  Take 1 tablet (40 mg total) by mouth daily. For 7 Days     ibuprofen 100 MG tablet  Commonly known as:  ADVIL,MOTRIN  Take 200 mg by mouth every 6 (six) hours as needed for pain.     lisinopril 2.5 MG tablet  Commonly known as:  PRINIVIL,ZESTRIL  Take 1 tablet (2.5 mg total) by mouth daily.     metoprolol tartrate 25 MG tablet  Commonly known as:  LOPRESSOR  Take 3 tablets (75 mg total) by mouth 2 (two) times daily.     potassium chloride SA 20 MEQ tablet  Commonly known as:  K-DUR,KLOR-CON  Take 1 tablet (20 mEq total) by mouth daily. For 7 Days     traMADol 50 MG tablet  Commonly known as:  ULTRAM  Take 1-2 tablets  (50-100 mg total) by mouth every 4 (four) hours as needed for moderate pain.        The patient has been discharged on:   1.Beta Blocker:  Yes [ y in the wound a the  ]                              No   [   ]                              If No, reason:  2.Ace Inhibitor/ARB: Yes [ y  ]                                     No  [    ]  If No, reason:  3.Statin:   Yes [ y  ]                  No  [   ]                  If No, reason:  4.Ecasa:  Yes  [ y  ]                  No   [   ]                  If No, reason:  Patient's condition is Good Disposition: Discharged home   [place], [place] 07/10/2013  12:43 PM  Heart rate improved CXR clear Ready for discharge

## 2013-07-10 NOTE — Progress Notes (Addendum)
      [address]       [place] [id number]             [phone]      5 Days Post-Op Procedure(s) (LRB): CORONARY ARTERY BYPASS GRAFTING (CABG) (N/A) INTRAOPERATIVE TRANSESOPHAGEAL ECHOCARDIOGRAM (N/A)  Subjective:  [name] has no complaints this morning.  He states he feels pretty good.  He is ambulating +BM  Objective: Vital signs in last 24 hours: Temp:  [98.6 F (37 C)-99.3 F (37.4 C)] 98.6 F (37 C) (02/11 0412) Pulse Rate:  [101-119] 108 (02/11 0412) Cardiac Rhythm:  [-] Sinus tachycardia (02/10 2120) Resp:  [18] 18 (02/11 0412) BP: (107-124)/(65-84) 124/84 mmHg (02/11 0412) SpO2:  [94 %-97 %] 97 % (02/11 0412) Weight:  [210 lb 1.6 oz (95.301 kg)] 210 lb 1.6 oz (95.301 kg) (02/11 0500)  Intake/Output from previous day: 02/10 0701 - 02/11 0700 In: 720 [P.O.:720] Out: -   General appearance: alert, cooperative and no distress Heart: regular rate and rhythm Lungs: clear to auscultation bilaterally Abdomen: soft, non-tender; bowel sounds normal; no masses,  no organomegaly Extremities: edema 1+ pitting Wound: clean and dry  Lab Results:  Recent Labs  07/08/13 0440  WBC 7.6  HGB 8.5*  HCT 25.4*  PLT 161   BMET:  Recent Labs  07/08/13 0440  NA 142  K 4.3  CL 105  CO2 26  GLUCOSE 115*  BUN 15  CREATININE 0.86  CALCIUM 7.9*    PT/INR: No results found for this basename: LABPROT, INR,  in the last 72 hours ABG    Component Value Date/Time   PHART 7.352 07/05/2013 2042   HCO3 24.1* 07/05/2013 2042   TCO2 23 07/06/2013 1642   ACIDBASEDEF 1.0 07/05/2013 2042   O2SAT 99.0 07/05/2013 2042   CBG (last 3)   Recent Labs  07/09/13 2014 07/10/13 0020 07/10/13 0418  GLUCAP 111* 104* 102*    Assessment/Plan: S/P Procedure(s) (LRB): CORONARY ARTERY BYPASS GRAFTING (CABG) (N/A) INTRAOPERATIVE TRANSESOPHAGEAL ECHOCARDIOGRAM (N/A)  1. CV- NSR, remains tachy on Lopressor 75 mg BID, Digoxin 2. Pulm- no acute issues, continue IS 3.  Renal- remains volume overloaded, weight is elevated about 13 lbs since admission, will give IV Lasix today 4. Dispo- patient doing well, some tachycardia, volume overloaded will continue diuresis, remover EPW today, likely home in 24-48 hours if volume status improves  LOS: 8 days     BARRETT, ERIN 07/10/2013  Tachycardia with Hb 8.5- cont po iron-folic acid IV lasix daily today, in am  patient examined and medical record reviewed,agree with above note. VAN TRIGT III,Nasario Czerniak 07/10/2013

## 2013-07-10 NOTE — Progress Notes (Signed)
Pt ambulated 450(ft) with walker and 1 assist. Pt has steady gait and walked without c/o pain or being tired. Pt back to room with call light in reach. Pt has been deep breathing and coughing with heart pillow as ordered.

## 2013-07-10 NOTE — Progress Notes (Signed)
CARDIAC REHAB PHASE I   PRE:  Rate/Rhythm: 97SR  BP:  Supine:   Sitting: 112/78  Standing:    SaO2: 97%RA  MODE:  Ambulation: 550 ft   POST:  Rate/Rhythm: 106 ST  BP:  Supine:   Sitting: 116/70  Standing:    SaO2: 94%RA 1344-1407 Pt walked 550 ft with rolling walker with slow steady gait. Stopped many times to rest. To recliner after walk. Tolerated well.   James Nuttingharlene Merinda Victorino, RN BSN  07/10/2013 2:03 PM

## 2013-07-11 ENCOUNTER — Inpatient Hospital Stay (HOSPITAL_COMMUNITY): Payer: Medicaid Other

## 2013-07-11 LAB — GLUCOSE, CAPILLARY
GLUCOSE-CAPILLARY: 97 mg/dL (ref 70–99)
Glucose-Capillary: 115 mg/dL — ABNORMAL HIGH (ref 70–99)

## 2013-07-11 LAB — BASIC METABOLIC PANEL
BUN: 13 mg/dL (ref 6–23)
CO2: 24 mEq/L (ref 19–32)
Calcium: 8.3 mg/dL — ABNORMAL LOW (ref 8.4–10.5)
Chloride: 100 mEq/L (ref 96–112)
Creatinine, Ser: 0.73 mg/dL (ref 0.50–1.35)
GFR calc Af Amer: >90 mL/min (ref >90)
GFR calc non Af Amer: >90 mL/min (ref >90)
Glucose, Bld: 108 mg/dL — ABNORMAL HIGH (ref 70–99)
Potassium: 4.4 mEq/L (ref 3.7–5.3)
Sodium: 139 mEq/L (ref 137–147)

## 2013-07-11 LAB — CBC
HCT: 28.4 % — ABNORMAL LOW (ref 39.0–52.0)
Hemoglobin: 9.2 g/dL — ABNORMAL LOW (ref 13.0–17.0)
MCH: 29.1 pg (ref 26.0–34.0)
MCHC: 32.4 g/dL (ref 30.0–36.0)
MCV: 89.9 fL (ref 78.0–100.0)
Platelets: 343 10*3/uL (ref 150–400)
RBC: 3.16 MIL/uL — ABNORMAL LOW (ref 4.22–5.81)
RDW: 14 % (ref 11.5–15.5)
WBC: 9.1 10*3/uL (ref 4.0–10.5)

## 2013-07-11 MED ORDER — FUROSEMIDE 40 MG PO TABS: 40.0000 mg | ORAL_TABLET | Freq: Every day | ORAL | Status: DC

## 2013-07-11 MED ORDER — ONDANSETRON HCL 4 MG PO TABS
4.0000 mg | ORAL_TABLET | Freq: Three times a day (TID) | ORAL | Status: DC | PRN
  Administered 2013-07-11: 4 mg via ORAL
  Filled 2013-07-11: qty 1

## 2013-07-11 MED ORDER — LISINOPRIL 2.5 MG PO TABS: 2.5000 mg | ORAL_TABLET | Freq: Every day | ORAL | Status: DC

## 2013-07-11 MED ORDER — POTASSIUM CHLORIDE CRYS ER 20 MEQ PO TBCR: 20.0000 meq | EXTENDED_RELEASE_TABLET | Freq: Every day | ORAL | Status: DC

## 2013-07-11 MED ORDER — METOPROLOL TARTRATE 25 MG PO TABS: 75.0000 mg | ORAL_TABLET | Freq: Two times a day (BID) | ORAL | Status: DC

## 2013-07-11 MED ORDER — TRAMADOL HCL 50 MG PO TABS: 50.0000 mg | ORAL_TABLET | ORAL | Status: DC | PRN

## 2013-07-11 MED ORDER — OXYCODONE HCL 5 MG PO TABS: 5.0000 mg | ORAL_TABLET | ORAL | Status: DC | PRN

## 2013-07-11 MED ORDER — ATORVASTATIN CALCIUM 80 MG PO TABS: 80.0000 mg | ORAL_TABLET | Freq: Every day | ORAL | Status: DC

## 2013-07-11 MED ORDER — DIGOXIN 125 MCG PO TABS: 0.1250 mg | ORAL_TABLET | Freq: Every day | ORAL | Status: DC

## 2013-07-11 MED ORDER — FE FUMARATE-B12-VIT C-FA-IFC PO CAPS: 1.0000 | ORAL_CAPSULE | Freq: Three times a day (TID) | ORAL | Status: DC

## 2013-07-11 MED ORDER — ASPIRIN 325 MG PO TBEC: 325.0000 mg | DELAYED_RELEASE_TABLET | Freq: Every day | ORAL | Status: DC

## 2013-07-11 NOTE — Progress Notes (Signed)
EPW removed per protocol without difficulty and intact, tolerated well, instructed bedrest for 1 hour, bp 142/79. Routine vitals signs began per protocol Egbert GaribaldiGlover, Maegen Wigle A

## 2013-07-11 NOTE — Discharge Instructions (Signed)
Cirugía de bypass coronario - Cuidados posteriores °(Coronary Artery Bypass Grafting, Care After) °Siga estas instrucciones durante las próximas semanas. Estas indicaciones le proporcionan información general acerca de cómo deberá cuidarse después del procedimiento. El médico también podrá darle instrucciones más específicas. El tratamiento se ha planificado de acuerdo a las prácticas médicas actuales, pero a veces se producen problemas. Comuníquese con el médico si tiene algún problema o tiene dudas después del procedimiento. °QUÉ ESPERAR DESPUÉS DEL PROCEDIMIENTO °El proceso de recuperación de una cirugía a corazón abierto es distinta para cada persona. Algunas personas se sienten bien luego de 3 o 4 semanas, mientras que para otras esto puede llevar más tiempo. Después del procedimiento, es típico tener las siguientes sensaciones: °· Náuseas y falta de apetito.   °· Estreñimiento. °· Debilidad y fatiga.   °· Depresión o irritabilidad.   °· Dolor o molestias en el sitio de la incisión. °INSTRUCCIONES PARA EL CUIDADO EN EL HOGAR °· Tome sólo medicamentos de venta libre o recetados, según las indicaciones del médico. Tome todos los medicamentos exactamente como se le indicó. No deje de tomar los medicamentos ni comience a tomar medicamentos nuevos sin consultar primero con su médico.   °· Tome todos los medicamentos como le indicó el médico. °· Haga ejercicios de respiración como le indique su médico. Use un dispositivo llamado espirómetro incentivo para realizar respiraciones profundas varias veces por día. Presione su pecho con una almohada o sus brazos al respirar profundamente y toser. °· Mantenga las áreas de la incisión limpias, secas y protegidas. Retire o cambie los apósitos (vendajes) tal como le indicó su médico. Puede tener cintas adhesivas sobre el área de la incisión. No retire las cintas. [place] por sí mismas. °· Controle diariamente la incisión y la zona que la rodea para observar la aparición de  enrojecimiento, hinchazón, aumento de la secreción o líquido. °· Si le hicieron incisiones en las piernas, haga lo siguiente: °· Evite cruzar las piernas.   °· Evite estar sentado durante largos períodos. Cambie la posición cada hora.   °· Eleve la/s pierna/s al estar sentado.   °· Use medias de compresión como le haya indicado su médico. Estas medias ayudan a evitar la formación de coágulos en las piernas. °· Puede darse una ducha si su médico la autoriza. Hasta ese momento, sólo tome baños de esponja. Seque la incisión con golpecitos. No la frote con un paño ni con la toalla. No tome baños de inmersión hasta que el médico la autorice. °· Consuma alimentos con fibra, como cereales integrales, legumbres, frutos secos, frutas y verduras. Puede comer carnes si son cortes magros. Evite los alimentos enlatados, procesados y fritos. °· Beba suficiente líquido para mantener la orina clara o de color amarillo pálido. °· Controle su peso a diario. Esto es importante porque lo ayudará a saber si está reteniendo líquidos, lo cual puede hacer trabajar más a su corazón y sus pulmones.   °· Limite las actividades según las indicaciones del médico. Es posible que le indiquen que: °· Interrumpa cualquier actividad de inmediato si siente dolor en el pecho, falta de aire, latidos irregulares, o mareos. Obtenga ayuda de inmediato si observa cualquiera de esos síntomas. °· Haga mucho reposo, pero muévase con frecuencia, durante breves períodos o haga caminatas cortas según lo que le indique su médico. Aumente sus actividades gradualmente. Es posible que requiera terapia física o rehabilitación cardíaca para ayudar a fortalecer sus músculos y aumentar su resistencia. °· Evite levantar, empujar o tirar de objetos que pesen más de 10 libras (4.5 kg) durante al   menos 6 semanas luego de la cirugía. °· No conduzca vehículos hasta que el médico lo autorice.  °· Consulte a su médico cuándo podrá regresar a trabajar y reanudar la actividad  sexual. °· Concurra a las consultas de control con su médico según las indicaciones.   °SOLICITE ATENCIÓN MÉDICA SI: °· Observa hinchazón, inflamación, aumenta el dolor o el drenaje en el sitio de la incisión.   °· Tiene fiebre.   °· Observa hinchazón en los tobillos o en los pies.   °· Siente dolor en las piernas.   °· Sube más de 2 libras (0.9 kg) de peso en un día. °· Siente náuseas o vomita. °· Tiene diarrea.  °SOLICITE ATENCIÓN MÉDICA DE INMEDIATO SI: °· Tiene dolor en el pecho que se extiende hacia su mandíbula o brazos. °· Le falta el aire.   °· Siente latidos cardíacos irregulares.   °· Nota un ruido como "clic" en el esternón al moverse.   °· Siente debilidad o adormecimiento en los brazos o en las piernas. °· Se siente mareado o sufre un desmayo.   °ASEGÚRESE DE QUE: °· Comprende estas instrucciones. °· Controlará su afección. °· Recibirá ayuda de inmediato si no mejora o si empeora. °Document Released: 09/10/2010 Document Revised: 01/16/2013 °ExitCare® Patient Information ©2014 [employer]. ° °Resección endoscópica de la vena safena - Cuidados posteriores  °(Endoscopic Saphenous Vein Harvesting, Care After) °Siga estas instrucciones durante las próximas semanas. Estas indicaciones le proporcionan información general acerca de cómo deberá cuidarse después del procedimiento. El médico también podrá darle instrucciones específicas. El tratamiento ha sido planificado según las prácticas médicas actuales, pero en algunos casos pueden ocurrir problemas. Comuníquese con el médico si tiene algún problema o tiene preguntas después del procedimiento.  °INSTRUCCIONES PARA EL CUIDADO EN EL HOGAR  °Medicamentos  °· Tome los medicamentos para calmar el dolor que le recetó el médico. Siga cuidadosamente las indicaciones. No tome ningún medicamento de venta libre para el dolor hasta que su médico lo autorice. Algunos medicamentos para el dolor pueden causar problemas hemorrágicos durante algunas semanas después de la  cirugía. °· Siga las instrucciones de su médico con respecto a conducir automóviles. Probablemente no le permitirán conducir después de una cirugía cardíaca. °· Tome los medicamentos que le recetó el cirujano. El médico debe controlar todos los medicamentos que tomaba antes de la cirugía cardíaca antes de que comience a tomarlos nuevamente. °Cuidado de las heridas  °· Consulte a su médico cuánto tiempo deberá usar la media o el vendaje elástico. °· Controle la zona de los cortes quirúrgicos (incisiones) cada vez que se cambie los vendajes. Observe si hay enrojecimiento o inflamación. °· Tendrá que volver para que le saquen los puntos (suturas) o las grapas. Consulte al cirujano cuándo deberá hacerlo. °· Pregunte al cirujano cuándo podrá volver a trabajar. °Actividad  °· Trate de mantener las piernas elevadas cuando esté sentado. °· Haga los ejercicios que los médicos le indicaron. Podrán incluir ejercicios de respiración profunda, toser, caminar y otros. °SOLICITE ATENCIÓN MÉDICA SI:  °· Tiene dudas relacionadas con los medicamentos. °· Aumenta el dolor en la pierna, especialmente si el medicamento para el dolor deja de hacer efecto. °· Aparecen nuevos hematomas en la pierna. °· La pierna se hincha, siente presión o se vuelve roja. °· Siente adormecimiento en la pierna. °SOLICITE ATENCIÓN MÉDICA DE INMEDIATO SI:  °· El dolor empeora. °· Alguna de las heridas supura sangre o líquido. °· La herida está roja, hinchada o caliente. °· Siente dolor en el pecho. °· Tiene dificultad para respirar. °· Tiene fiebre. °· Siente dolor   cerca de la incisión en la pierna. °ASEGÚRESE DE QUE:  °· Comprende estas instrucciones. °· Controlará su enfermedad. °· Solicitará ayuda de inmediato si no mejora o si empeora. °Document Released: 01/26/2011 Document Revised: 08/08/2011 °ExitCare® Patient Information ©2014 [employer]. ° ° °

## 2013-07-11 NOTE — Progress Notes (Signed)
1400-1500 Cardiac Rehab Completed discharge education with pt and niece. Pt agreed to have niece Amy Arzola to be his interpreter and pt signed release of responsibility for interpretation form. They voice understanding. Pt agrees to [facility]pt. [facility] [place], will send referral. I placed recovery from heart surgery video on for them to watch. Beatrix FettersHughes, Jader Desai G, RN 07/11/2013 2:06 PM

## 2013-07-11 NOTE — Progress Notes (Addendum)
      [address]       [place] [id number]             [phone]      6 Days Post-Op Procedure(s) (LRB): CORONARY ARTERY BYPASS GRAFTING (CABG) (N/A) INTRAOPERATIVE TRANSESOPHAGEAL ECHOCARDIOGRAM (N/A)  Subjective:  [name] has no complaints this morning. He is hoping to go home today.  + ambulation + BM  Objective: Vital signs in last 24 hours: Temp:  [98.3 F (36.8 C)-98.9 F (37.2 C)] 98.3 F (36.8 C) (02/12 0407) Pulse Rate:  [96-119] 105 (02/12 0407) Cardiac Rhythm:  [-] Sinus tachycardia (02/11 2113) Resp:  [18] 18 (02/12 0407) BP: (98-131)/(72-77) 98/77 mmHg (02/12 0407) SpO2:  [96 %-99 %] 96 % (02/12 0407) Weight:  [203 lb 11.3 oz (92.4 kg)] 203 lb 11.3 oz (92.4 kg) (02/12 0407)  Intake/Output from previous day: 02/11 0701 - 02/12 0700 In: 1080 [P.O.:1080] Out: -   General appearance: alert, cooperative and no distress Heart: regular rate and rhythm Lungs: clear to auscultation bilaterally Abdomen: soft, non-tender; bowel sounds normal; no masses,  no organomegaly Extremities: edema improving, remains 1+ Wound: clean and dry  Lab Results:  Recent Labs  07/11/13 0559  WBC 9.1  HGB 9.2*  HCT 28.4*  PLT 343   BMET:  Recent Labs  07/11/13 0559  NA 139  K 4.4  CL 100  CO2 24  GLUCOSE 108*  BUN 13  CREATININE 0.73  CALCIUM 8.3*    PT/INR: No results found for this basename: LABPROT, INR,  in the last 72 hours ABG    Component Value Date/Time   PHART 7.352 07/05/2013 2042   HCO3 24.1* 07/05/2013 2042   TCO2 23 07/06/2013 1642   ACIDBASEDEF 1.0 07/05/2013 2042   O2SAT 99.0 07/05/2013 2042   CBG (last 3)   Recent Labs  07/10/13 1604 07/10/13 2112 07/11/13 0607  GLUCAP 102* 135* 115*    Assessment/Plan: S/P Procedure(s) (LRB): CORONARY ARTERY BYPASS GRAFTING (CABG) (N/A) INTRAOPERATIVE TRANSESOPHAGEAL ECHOCARDIOGRAM (N/A)  1. CV- NSR, remains tachy, borderline hypotension- continue Lopressor, Digoxin, and  Lisinopril 2. Pulm- no acute issues, continue IS 3. Renal- creatinine WNL, lost 7lbs yesterday, will continue Lasix 4. Dispo- patient stable will plan to d/c home today   LOS: 9 days    BARRETT, ERIN 07/11/2013  patient examined and medical record reviewed,agree with above note. VAN TRIGT III,PETER 07/11/2013

## 2013-07-17 ENCOUNTER — Encounter: Payer: Self-pay | Admitting: *Deleted

## 2013-07-25 ENCOUNTER — Encounter: Payer: Self-pay | Admitting: Cardiovascular Disease

## 2013-07-29 ENCOUNTER — Ambulatory Visit (INDEPENDENT_AMBULATORY_CARE_PROVIDER_SITE_OTHER): Payer: BC Managed Care – PPO | Admitting: Cardiovascular Disease

## 2013-07-29 ENCOUNTER — Encounter: Payer: Self-pay | Admitting: Cardiovascular Disease

## 2013-07-29 ENCOUNTER — Other Ambulatory Visit: Payer: Self-pay | Admitting: *Deleted

## 2013-07-29 VITALS — BP 123/82 | HR 97 | Ht 65.0 in | Wt 193.8 lb

## 2013-07-29 DIAGNOSIS — E785 Hyperlipidemia, unspecified: Secondary | ICD-10-CM | POA: Insufficient documentation

## 2013-07-29 DIAGNOSIS — I10 Essential (primary) hypertension: Secondary | ICD-10-CM

## 2013-07-29 DIAGNOSIS — I2510 Atherosclerotic heart disease of native coronary artery without angina pectoris: Secondary | ICD-10-CM

## 2013-07-29 DIAGNOSIS — R079 Chest pain, unspecified: Secondary | ICD-10-CM

## 2013-07-29 MED ORDER — METOPROLOL TARTRATE 100 MG PO TABS: 100.0000 mg | ORAL_TABLET | Freq: Two times a day (BID) | ORAL | Status: DC

## 2013-07-29 MED ORDER — FUROSEMIDE 20 MG PO TABS: 20.0000 mg | ORAL_TABLET | Freq: Every day | ORAL | Status: DC

## 2013-07-29 NOTE — Assessment & Plan Note (Signed)
The patient did not have atrial fibrillation and ejection fraction was normal. Thus, I discontinued digoxin. He is currently on metoprolol 75 mg twice daily. I increased the dose to 100 mg twice daily given his relatively high resting heart rate and discontinued lisinopril especially that his ejection fraction was normal.

## 2013-07-29 NOTE — Patient Instructions (Signed)
Your physician has recommended you make the following change in your medication:  Increase Metoprolol to 100 mg twice daily  Stop Lisinopril and Digoxin  Start Lasix 20 mg daily  Your physician recommends that you schedule a follow-up appointment in:  1 month

## 2013-07-29 NOTE — Progress Notes (Signed)
HPI  This is a 58 year old Hispanic man who is here today for a followup visit after recent CABG. He presented to [facility] with non-ST elevation myocardial infarction. He underwent cardiac catheterization which showed significant three-vessel coronary artery disease with normal ejection fraction. He underwent CABG at [facility] on February 5th without complications. He had no arrhythmia postoperatively but was noted to have sinus tachycardia. He was thus discharged on digoxin in addition to metoprolol. He took Lasix for one week. He is overall doing reasonably well but continues to have discomfort at the surgical wound site. He also noticed worsening edema worse on the right side after he stopped taking Lasix. He is having cough mostly at night. No chest tightness.  No Known Allergies   Current Outpatient Prescriptions on File Prior to Visit  Medication Sig Dispense Refill  . aspirin EC 325 MG EC tablet Take 1 tablet (325 mg total) by mouth daily.  30 tablet  0  . atorvastatin (LIPITOR) 80 MG tablet Take 1 tablet (80 mg total) by mouth daily at 6 PM.  30 tablet  3  . ferrous fumarate-b12-vitamic C-folic acid (TRINSICON / FOLTRIN) capsule Take 1 capsule by mouth 3 (three) times daily after meals.  90 capsule  3  . ibuprofen (ADVIL,MOTRIN) 100 MG tablet Take 200 mg by mouth every 6 (six) hours as needed for pain.       . traMADol (ULTRAM) 50 MG tablet Take 1-2 tablets (50-100 mg total) by mouth every 4 (four) hours as needed for moderate pain.  30 tablet  0   No current facility-administered medications on file prior to visit.     Past Medical History  Diagnosis Date  . Medical history non-contributory   . Glucose intolerance (impaired glucose tolerance)   . MI (myocardial infarction)   . Obesity   . Bronchitis   . Coronary artery disease     Non-ST elevation myocardial infarction cardiac catheterization showed significant three-vessel coronary artery disease. status post CABG in February of 2015  with LIMA to LAD, SVG to right PDA and sequential SVG to OM 1 and OM 3. Ejection fraction was 60%.  . Hyperlipidemia   . Hypertension      Past Surgical History  Procedure Laterality Date  . No past surgeries    . Coronary artery bypass graft N/A 07/05/2013    Procedure: CORONARY ARTERY BYPASS GRAFTING (CABG);  Surgeon: [name], MD;  Location: [facility] OR;  Service: Open Heart Surgery;  Laterality: N/A;  . Intraoperative transesophageal echocardiogram N/A 07/05/2013    Procedure: INTRAOPERATIVE TRANSESOPHAGEAL ECHOCARDIOGRAM;  Surgeon: [name], MD;  Location: [facility] OR;  Service: Open Heart Surgery;  Laterality: N/A;  . Cardiac catheterization  06/2013    [facility]     Family History  Problem Relation Age of Onset  . Heart disease Father   . Heart attack Father      History   Social History  . Marital Status: Married    Spouse Name: N/A    Number of Children: N/A  . Years of Education: N/A   Occupational History  . Not on file.   Social History Main Topics  . Smoking status: Never Smoker   . Smokeless tobacco: Not on file  . Alcohol Use: No  . Drug Use: No  . Sexual Activity: Not on file   Other Topics Concern  . Not on file   Social History Narrative  . No narrative on file     PHYSICAL EXAM  BP 123/82  Pulse 97  Ht 5\' 5"  (1.651 m)  Wt 193 lb 12 oz (87.884 kg)  BMI 32.24 kg/m2 Constitutional: He is oriented to person, place, and time. He appears well-developed and well-nourished. No distress.  HENT: No nasal discharge.  Head: Normocephalic and atraumatic.  Eyes: Pupils are equal and round.  No discharge. Neck: Normal range of motion. Neck supple. No JVD present. No thyromegaly present.  Cardiovascular: Normal rate, regular rhythm, normal heart sounds. Exam reveals no gallop and no friction rub. No murmur heard. Surgical wound is healing nicely. Pulmonary/Chest: Effort normal and breath sounds normal. No stridor. No respiratory distress. He has no  wheezes. He has no rales. He exhibits no tenderness.  Abdominal: Soft. Bowel sounds are normal. He exhibits no distension. There is no tenderness. There is no rebound and no guarding.  Musculoskeletal: Normal range of motion. He exhibits +1 edema worse on the right side and no tenderness.  Neurological: He is alert and oriented to person, place, and time. Coordination normal.  Skin: Skin is warm and dry. No rash noted. He is not diaphoretic. No erythema. No pallor.  Psychiatric: He has a normal mood and affect. His behavior is normal. Judgment and thought content normal.       EKG: Normal sinus rhythm with right bundle branch block and possible old inferior infarct.   ASSESSMENT AND PLAN

## 2013-07-29 NOTE — Assessment & Plan Note (Signed)
He seems to be doing reasonably well and recovering slowly after recent CABG. Continue medical therapy. He does seem to have worsening edema especially on the right side. Thus, on starting him back on a small dose furosemide 20 mg once daily.

## 2013-08-01 ENCOUNTER — Ambulatory Visit
Admission: RE | Admit: 2013-08-01 | Discharge: 2013-08-01 | Disposition: A | Discharge status: Home / Self Care | Payer: BC Managed Care – PPO | Source: Ambulatory Visit | Attending: Cardiothoracic Surgery | Admitting: Cardiothoracic Surgery

## 2013-08-01 ENCOUNTER — Encounter: Payer: Self-pay | Admitting: Cardiothoracic Surgery

## 2013-08-01 ENCOUNTER — Ambulatory Visit (HOSPITAL_COMMUNITY)
Admission: RE | Admit: 2013-08-01 | Discharge: 2013-08-01 | Disposition: A | Discharge status: Home / Self Care | Payer: BC Managed Care – PPO | Source: Ambulatory Visit | Attending: Cardiothoracic Surgery | Admitting: Cardiothoracic Surgery

## 2013-08-01 ENCOUNTER — Other Ambulatory Visit (HOSPITAL_COMMUNITY): Payer: Self-pay | Admitting: Cardiothoracic Surgery

## 2013-08-01 ENCOUNTER — Other Ambulatory Visit: Payer: Self-pay | Admitting: *Deleted

## 2013-08-01 ENCOUNTER — Ambulatory Visit (INDEPENDENT_AMBULATORY_CARE_PROVIDER_SITE_OTHER): Payer: BC Managed Care – PPO | Admitting: Cardiothoracic Surgery

## 2013-08-01 VITALS — BP 133/91 | HR 98 | Resp 18 | Ht 65.0 in | Wt 193.0 lb

## 2013-08-01 DIAGNOSIS — M79609 Pain in unspecified limb: Secondary | ICD-10-CM

## 2013-08-01 DIAGNOSIS — M7989 Other specified soft tissue disorders: Secondary | ICD-10-CM

## 2013-08-01 DIAGNOSIS — Z951 Presence of aortocoronary bypass graft: Secondary | ICD-10-CM

## 2013-08-01 DIAGNOSIS — M79604 Pain in right leg: Secondary | ICD-10-CM

## 2013-08-01 DIAGNOSIS — I2510 Atherosclerotic heart disease of native coronary artery without angina pectoris: Secondary | ICD-10-CM

## 2013-08-01 DIAGNOSIS — R52 Pain, unspecified: Secondary | ICD-10-CM

## 2013-08-01 DIAGNOSIS — R609 Edema, unspecified: Secondary | ICD-10-CM

## 2013-08-01 DIAGNOSIS — G8918 Other acute postprocedural pain: Secondary | ICD-10-CM

## 2013-08-01 MED ORDER — TRAMADOL HCL 50 MG PO TABS: 50.0000 mg | ORAL_TABLET | Freq: Four times a day (QID) | ORAL | Status: DC | PRN

## 2013-08-01 NOTE — Progress Notes (Signed)
VASCULAR LAB PRELIMINARY  PRELIMINARY  PRELIMINARY  PRELIMINARY  Right lower extremity venous duplex completed.    Preliminary report: Negative for deep and superficial vein thrombosis.  Hematoma/fluid noted in proximal calf at site of GSV harvest.   Katrinia Straker, RVT 08/01/2013, 5:43 PM

## 2013-08-01 NOTE — Progress Notes (Signed)
PCP is No PCP Per Patient Referring Provider is Iran Ouch, MD  Chief Complaint  Patient presents with  . Routine Post Op    3 week f/u from surgery with CXR, S/P CABG x 4 on 07/05/13...has seen Dr. Kirke Corin post op    HPI: One month followup after CABG x34 in this 58 year old Hispanic male with three-vessel CAD and unstable angina. He is doing well at home. His main problem is persistent edema his right leg from the end of scopic vein harvest. He is taking Lasix 20 mg daily. Leg is painful with walking. We'll check an ultrasound to rule out DVT. His other surgical incisions are healing well, his chest x-rays clear and no evidence of recurrent angina.   Past Medical History  Diagnosis Date  . Medical history non-contributory   . Glucose intolerance (impaired glucose tolerance)   . MI (myocardial infarction)   . Obesity   . Bronchitis   . Coronary artery disease     Non-ST elevation myocardial infarction cardiac catheterization showed significant three-vessel coronary artery disease. 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 Ejection fraction was 60%.  . Hyperlipidemia   . Hypertension     Past Surgical History  Procedure Laterality Date  . No past surgeries    . Coronary artery bypass graft N/A 07/05/2013    Procedure: CORONARY ARTERY BYPASS GRAFTING (CABG);  Surgeon: [name], MD;  Location: [facility] OR;  Service: Open Heart Surgery;  Laterality: N/A;  . Intraoperative transesophageal echocardiogram N/A 07/05/2013    Procedure: INTRAOPERATIVE TRANSESOPHAGEAL ECHOCARDIOGRAM;  Surgeon: [name], MD;  Location: [facility] OR;  Service: Open Heart Surgery;  Laterality: N/A;  . Cardiac catheterization  06/2013    [facility]    Family History  Problem Relation Age of Onset  . Heart disease Father   . Heart attack Father     Social History History  Substance Use Topics  . Smoking status: Never Smoker   . Smokeless  tobacco: Not on file  . Alcohol Use: No    Current Outpatient Prescriptions  Medication Sig Dispense Refill  . aspirin EC 325 MG EC tablet Take 1 tablet (325 mg total) by mouth daily.  30 tablet  0  . atorvastatin (LIPITOR) 80 MG tablet Take 1 tablet (80 mg total) by mouth daily at 6 PM.  30 tablet  3  . ferrous fumarate-b12-vitamic C-folic acid (TRINSICON / FOLTRIN) capsule Take 1 capsule by mouth 3 (three) times daily after meals.  90 capsule  3  . furosemide (LASIX) 20 MG tablet Take 1 tablet (20 mg total) by mouth daily.  30 tablet  0  . ibuprofen (ADVIL,MOTRIN) 100 MG tablet Take 200 mg by mouth every 6 (six) hours as needed for pain.       . metoprolol (LOPRESSOR) 100 MG tablet Take 1 tablet (100 mg total) by mouth 2 (two) times daily.  60 tablet  6  . traMADol (ULTRAM) 50 MG tablet Take 1-2 tablets (50-100 mg total) by mouth every 6 (six) hours as needed for moderate pain.  40 tablet  0   No current facility-administered medications for this visit.    No Known Allergies  Review of Systems improved appetite and strength main problem is with a swollen Tender right leg  BP 133/91  Pulse 98  Resp 18  Ht 5\' 5"  (1.651 m)  Wt 193 lb (87.544  kg)  BMI 32.12 kg/m2  SpO2 99% Physical Exam Alert and comfortable Lungs clear Sternum stable Heart rate regular Right thigh incision well-healed Right lower leg with brawny edema slightly tender no erythema or evidence of cellulitis  Diagnostic Tests: Chest x-ray clear, no effusion  Impression: Doing well except for his right lower extremity. We will check ultrasound to rule out DVT. 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 Continue current meds. Followup exam to monitor progress approximately a month. He will need to start outpatient cardiac rehabilitation once he can walk better.

## 2013-08-02 ENCOUNTER — Ambulatory Visit: Payer: Self-pay | Admitting: Cardiothoracic Surgery

## 2013-08-16 ENCOUNTER — Telehealth: Payer: Self-pay | Admitting: *Deleted

## 2013-09-02 ENCOUNTER — Ambulatory Visit (INDEPENDENT_AMBULATORY_CARE_PROVIDER_SITE_OTHER): Payer: BC Managed Care – PPO | Admitting: Cardiovascular Disease

## 2013-09-02 ENCOUNTER — Encounter: Payer: Self-pay | Admitting: Cardiovascular Disease

## 2013-09-02 VITALS — BP 138/91 | HR 73 | Ht 65.0 in | Wt 188.8 lb

## 2013-09-02 DIAGNOSIS — R609 Edema, unspecified: Secondary | ICD-10-CM

## 2013-09-02 DIAGNOSIS — E785 Hyperlipidemia, unspecified: Secondary | ICD-10-CM

## 2013-09-02 DIAGNOSIS — I2510 Atherosclerotic heart disease of native coronary artery without angina pectoris: Secondary | ICD-10-CM

## 2013-09-02 DIAGNOSIS — I10 Essential (primary) hypertension: Secondary | ICD-10-CM

## 2013-09-02 DIAGNOSIS — I214 Non-ST elevation (NSTEMI) myocardial infarction: Secondary | ICD-10-CM

## 2013-09-02 DIAGNOSIS — R600 Localized edema: Secondary | ICD-10-CM | POA: Insufficient documentation

## 2013-09-02 DIAGNOSIS — R079 Chest pain, unspecified: Secondary | ICD-10-CM

## 2013-09-02 MED ORDER — ATORVASTATIN CALCIUM 80 MG PO TABS: 80.0000 mg | ORAL_TABLET | Freq: Every day | ORAL | Status: DC

## 2013-09-02 MED ORDER — METOPROLOL TARTRATE 100 MG PO TABS: 100.0000 mg | ORAL_TABLET | Freq: Two times a day (BID) | ORAL | Status: DC

## 2013-09-02 NOTE — Patient Instructions (Signed)
Labs today.   Continue same medications.   Use knee high support stocking (medium pressure - 30 mm Hg) on the right leg during the day and take off before going to sleep.   Follow up in 3 months.

## 2013-09-02 NOTE — Progress Notes (Signed)
HPI  This is a 58 year old Hispanic man who is here today for a followup visit regarding coronary artery disease status post CABG in February of 2015. He presented to [facility] with non-ST elevation myocardial infarction. He underwent cardiac catheterization which showed significant three-vessel coronary artery disease with normal ejection fraction. He underwent CABG at [facility] on February 5th without complications. He had no arrhythmia postoperatively but was noted to have sinus tachycardia. He was thus discharged on digoxin in addition to metoprolol.  He had worsening swelling in the right leg. He was placed back on Lasix 20 mg once daily. He underwent venous duplex ultrasound which showed no evidence of DVT. However, there was fluid/hematoma  in the proximal calf at the site of prior vein harvest. During last visit, I discontinued digoxin, increase metoprolol and discontinued lisinopril. He is feeling better gradually. He continues to have chest pain only if he lifts heavy objects but not with walking activities. He continues to have right leg swelling and discomfort which is usually worse at the end of the day.   No Known Allergies   Current Outpatient Prescriptions on File Prior to Visit  Medication Sig Dispense Refill  . aspirin EC 325 MG EC tablet Take 1 tablet (325 mg total) by mouth daily.  30 tablet  0  . atorvastatin (LIPITOR) 80 MG tablet Take 1 tablet (80 mg total) by mouth daily at 6 PM.  30 tablet  3  . ferrous fumarate-b12-vitamic C-folic acid (TRINSICON / FOLTRIN) capsule Take 1 capsule by mouth 3 (three) times daily after meals.  90 capsule  3  . furosemide (LASIX) 20 MG tablet Take 1 tablet (20 mg total) by mouth daily.  30 tablet  0  . ibuprofen (ADVIL,MOTRIN) 100 MG tablet Take 200 mg by mouth every 6 (six) hours as needed for pain.       . metoprolol (LOPRESSOR) 100 MG tablet Take 1 tablet (100 mg total) by mouth 2 (two) times daily.  60 tablet  6  . traMADol (ULTRAM) 50 MG  tablet Take 1-2 tablets (50-100 mg total) by mouth every 6 (six) hours as needed for moderate pain.  40 tablet  0   No current facility-administered medications on file prior to visit.     Past Medical History  Diagnosis Date  . Medical history non-contributory   . Glucose intolerance (impaired glucose tolerance)   . MI (myocardial infarction)   . Obesity   . Bronchitis   . Coronary artery disease     Non-ST elevation myocardial infarction cardiac catheterization showed significant three-vessel coronary artery disease. 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 Ejection fraction was 60%.  . Hyperlipidemia   . Hypertension      Past Surgical History  Procedure Laterality Date  . No past surgeries    . Coronary artery bypass graft N/A 07/05/2013    Procedure: CORONARY ARTERY BYPASS GRAFTING (CABG);  Surgeon: [name], MD;  Location: [facility] OR;  Service: Open Heart Surgery;  Laterality: N/A;  . Intraoperative transesophageal echocardiogram N/A 07/05/2013    Procedure: INTRAOPERATIVE TRANSESOPHAGEAL ECHOCARDIOGRAM;  Surgeon: [name], MD;  Location: [facility] OR;  Service: Open Heart Surgery;  Laterality: N/A;  . Cardiac catheterization  06/2013    [facility]     Family History  Problem Relation Age of Onset  . Heart disease Father   . Heart attack Father  History   Social History  . Marital Status: Married    Spouse Name: N/A    Number of Children: N/A  . Years of Education: N/A   Occupational History  . Not on file.   Social History Main Topics  . Smoking status: Never Smoker   . Smokeless tobacco: Not on file  . Alcohol Use: No  . Drug Use: No  . Sexual Activity: Not on file   Other Topics Concern  . Not on file   Social History Narrative  . No narrative on file     PHYSICAL EXAM   Ht 5\' 5"  (1.651 m)  Wt 188 lb 12 oz (85.616 kg)  BMI 31.41 kg/m2 Constitutional: He is oriented to person,  place, and time. He appears well-developed and well-nourished. No distress.  HENT: No nasal discharge.  Head: Normocephalic and atraumatic.  Eyes: Pupils are equal and round.  No discharge. Neck: Normal range of motion. Neck supple. No JVD present. No thyromegaly present.  Cardiovascular: Normal rate, regular rhythm, normal heart sounds. Exam reveals no gallop and no friction rub. No murmur heard. Surgical wound is healing nicely. Pulmonary/Chest: Effort normal and breath sounds normal. No stridor. No respiratory distress. He has no wheezes. He has no rales. He exhibits no tenderness.  Abdominal: Soft. Bowel sounds are normal. He exhibits no distension. There is no tenderness. There is no rebound and no guarding.  Musculoskeletal: Normal range of motion. He exhibits +2 edema on the right side with mild tenderness.  Neurological: He is alert and oriented to person, place, and time. Coordination normal.  Skin: Skin is warm and dry. No rash noted. He is not diaphoretic. No erythema. No pallor.  Psychiatric: He has a normal mood and affect. His behavior is normal. Judgment and thought content normal.       EKG: Normal sinus rhythm with right bundle branch block and possible old inferior infarct.   ASSESSMENT AND PLAN

## 2013-09-02 NOTE — Assessment & Plan Note (Signed)
This seems to be due to venous insufficiency as well as residual fluid collection after vein harvest. Venous duplex showed no evidence of DVT. Symptoms are significantly better early in the morning before he starts ambulating. I instructed him to start using knee-high support stockings during the day.

## 2013-09-02 NOTE — Assessment & Plan Note (Signed)
He is overall doing reasonably well. Resting heart rate improved after increasing metoprolol. He continues to have musculoskeletal chest pain.

## 2013-09-02 NOTE — Assessment & Plan Note (Signed)
Diastolic blood pressure is mildly elevated. Continue to monitor.

## 2013-09-02 NOTE — Assessment & Plan Note (Signed)
Continue high dose atorvastatin. Check fasting lipid and liver profile today. 

## 2013-09-03 LAB — HEPATIC FUNCTION PANEL
ALK PHOS: 117 IU/L (ref 39–117)
ALT: 20 IU/L (ref 0–44)
AST: 14 IU/L (ref 0–40)
Albumin: 4.6 g/dL (ref 3.5–5.5)
Bilirubin, Direct: 0.1 mg/dL (ref 0.00–0.40)
TOTAL PROTEIN: 7.3 g/dL (ref 6.0–8.5)
Total Bilirubin: 0.3 mg/dL (ref 0.0–1.2)

## 2013-09-03 LAB — LIPID PANEL
CHOLESTEROL TOTAL: 125 mg/dL (ref 100–199)
Chol/HDL Ratio: 3.4 ratio units (ref 0.0–5.0)
HDL: 37 mg/dL — AB (ref >39)
LDL Calculated: 68 mg/dL (ref 0–99)
Triglycerides: 102 mg/dL (ref 0–149)
VLDL Cholesterol Cal: 20 mg/dL (ref 5–40)

## 2013-09-04 ENCOUNTER — Ambulatory Visit (INDEPENDENT_AMBULATORY_CARE_PROVIDER_SITE_OTHER): Payer: BC Managed Care – PPO | Admitting: Cardiothoracic Surgery

## 2013-09-04 ENCOUNTER — Encounter: Payer: Self-pay | Admitting: Cardiothoracic Surgery

## 2013-09-04 VITALS — BP 114/75 | HR 85 | Resp 16 | Ht 65.0 in | Wt 188.0 lb

## 2013-09-04 DIAGNOSIS — I2510 Atherosclerotic heart disease of native coronary artery without angina pectoris: Secondary | ICD-10-CM

## 2013-09-04 DIAGNOSIS — Z951 Presence of aortocoronary bypass graft: Secondary | ICD-10-CM

## 2013-09-04 NOTE — Progress Notes (Signed)
  HPI:  Patient returns for routine postoperative follow-up having undergone multivessel CABG. He presented with unstable angina and multivessel CAD. The patient's early postoperative recovery while in the hospital was notable for right leg edema from the saphenous vein site. This initially interfered with his ambulation. Ultrasound was negative for DVT. Since hospital discharge the patient reports leg is improved and his ambulation is significantly better. 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 No evidence of cellulitis. No significant ankle edema. He will get compression stockings and start wearing eminently the leg elevated as much as possible during the day.   Current Outpatient Prescriptions  Medication Sig Dispense Refill  . aspirin EC 325 MG EC tablet Take 1 tablet (325 mg total) by mouth daily.  30 tablet  0  . atorvastatin (LIPITOR) 80 MG tablet Take 1 tablet (80 mg total) by mouth daily at 6 PM.  30 tablet  6  . ferrous fumarate-b12-vitamic C-folic acid (TRINSICON / FOLTRIN) capsule Take 1 capsule by mouth 3 (three) times daily after meals.  90 capsule  3  . ibuprofen (ADVIL,MOTRIN) 100 MG tablet Take 200 mg by mouth every 6 (six) hours as needed for pain.       . metoprolol (LOPRESSOR) 100 MG tablet Take 1 tablet (100 mg total) by mouth 2 (two) times daily.  60 tablet  6  . traMADol (ULTRAM) 50 MG tablet Take 1-2 tablets (50-100 mg total) by mouth every 6 (six) hours as needed for moderate pain.  40 tablet  0   No current facility-administered medications for this visit.    Physical Examl Filed Vitals:   09/04/13 1213  BP: 114/75  Pulse: 85  Resp: 16   he is alert and oriented lungs are clear Heart rhythm is regular without murmur or gallop Abdomen soft Right leg incision is well-healed, the edema of leg is significantly better and legs nontender  Last chest x-ray reviewed and is clear.  :   Impression:Doing well now 2  months postop. He was encouraged to get a support stockings. We will stop his iron to continue his other medications. I do not see the need for Lasix at this time.  Plan:Return to normal daily activities. He is being followed by [facility]ologist at [facility]. Return when necessary

## 2013-11-19 ENCOUNTER — Other Ambulatory Visit: Payer: Self-pay | Admitting: Physician Assistant

## 2013-11-21 ENCOUNTER — Telehealth: Payer: Self-pay

## 2013-11-21 ENCOUNTER — Other Ambulatory Visit: Payer: Self-pay | Admitting: Cardiovascular Disease

## 2013-11-21 NOTE — Telephone Encounter (Signed)
Can you please review the patient's medication list; not seeing he is on digoxin.

## 2013-11-21 NOTE — Telephone Encounter (Signed)
Message copied by Festus AloeRESPO, Alexiz Cothran G on Thu Nov 21, 2013  1:16 PM ------      Message from: Lisabeth DevoidGRAY, DEBRA F      Created: Thu Nov 21, 2013 12:34 PM      Regarding: digoxin       Jasmine DecemberSharon      Dr. Kirke CorinArida discontinued the digoxin on 07/29/13 ov            :)      Debbie ------

## 2013-11-21 NOTE — Telephone Encounter (Signed)
Sent pharmacy a message the digoxin is not approved by Dr. Kirke CorinArida; Dr. Kirke CorinArida discontinued on 07/29/2013.

## 2013-11-25 ENCOUNTER — Other Ambulatory Visit: Payer: Self-pay | Admitting: Physician Assistant

## 2013-11-27 NOTE — Telephone Encounter (Signed)
Error

## 2013-12-23 ENCOUNTER — Ambulatory Visit (INDEPENDENT_AMBULATORY_CARE_PROVIDER_SITE_OTHER): Payer: BC Managed Care – PPO | Admitting: Cardiovascular Disease

## 2013-12-23 ENCOUNTER — Encounter: Payer: Self-pay | Admitting: Cardiovascular Disease

## 2013-12-23 VITALS — BP 118/88 | HR 69 | Ht 66.0 in | Wt 198.0 lb

## 2013-12-23 DIAGNOSIS — I2510 Atherosclerotic heart disease of native coronary artery without angina pectoris: Secondary | ICD-10-CM

## 2013-12-23 DIAGNOSIS — I10 Essential (primary) hypertension: Secondary | ICD-10-CM

## 2013-12-23 DIAGNOSIS — R0789 Other chest pain: Secondary | ICD-10-CM

## 2013-12-23 DIAGNOSIS — E785 Hyperlipidemia, unspecified: Secondary | ICD-10-CM

## 2013-12-23 DIAGNOSIS — I209 Angina pectoris, unspecified: Secondary | ICD-10-CM

## 2013-12-23 DIAGNOSIS — I25119 Atherosclerotic heart disease of native coronary artery with unspecified angina pectoris: Secondary | ICD-10-CM

## 2013-12-23 NOTE — Patient Instructions (Signed)
Continue same medications.   Your physician wants you to follow-up in: 6 months.  You will receive a reminder letter in the mail two months in advance. If you don't receive a letter, please call our office to schedule the follow-up appointment.  

## 2013-12-23 NOTE — Progress Notes (Signed)
HPI  This is a 58 year old Hispanic man who is here today for a followup visit regarding coronary artery disease status post CABG in February of 2015. He presented to [facility] with non-ST elevation myocardial infarction. He underwent cardiac catheterization which showed significant three-vessel coronary artery disease with normal ejection fraction. He underwent CABG at [facility] on February 5th without complications. He had worsening swelling in the right leg. He was placed back on Lasix 20 mg once daily. He underwent venous duplex ultrasound which showed no evidence of DVT.  He continues to gradually feel better. He continues to have chest pain only if he lifts heavy objects but not with walking activities. Lower extremity swelling improved significantly.  No Known Allergies   Current Outpatient Prescriptions on File Prior to Visit  Medication Sig Dispense Refill  . aspirin EC 325 MG EC tablet Take 1 tablet (325 mg total) by mouth daily.  30 tablet  0  . atorvastatin (LIPITOR) 80 MG tablet Take 1 tablet (80 mg total) by mouth daily at 6 PM.  30 tablet  6  . ferrous fumarate-b12-vitamic C-folic acid (TRINSICON / FOLTRIN) capsule Take 1 capsule by mouth 3 (three) times daily after meals.  90 capsule  3  . ibuprofen (ADVIL,MOTRIN) 100 MG tablet Take 200 mg by mouth every 6 (six) hours as needed for pain.       . metoprolol (LOPRESSOR) 100 MG tablet Take 1 tablet (100 mg total) by mouth 2 (two) times daily.  60 tablet  6   No current facility-administered medications on file prior to visit.     Past Medical History  Diagnosis Date  . Medical history non-contributory   . Glucose intolerance (impaired glucose tolerance)   . MI (myocardial infarction)   . Obesity   . Bronchitis   . Coronary artery disease     Non-ST elevation myocardial infarction cardiac catheterization showed significant three-vessel coronary artery disease. status post CABG in February of 2015 with LIMA to LAD, SVG to right PDA  and sequential SVG to OM 1 and OM 3. Ejection fraction was 60%.  . Hyperlipidemia   . Hypertension      Past Surgical History  Procedure Laterality Date  . No past surgeries    . Coronary artery bypass graft N/A 07/05/2013    Procedure: CORONARY ARTERY BYPASS GRAFTING (CABG);  Surgeon: [name], MD;  Location: [facility] OR;  Service: Open Heart Surgery;  Laterality: N/A;  . Intraoperative transesophageal echocardiogram N/A 07/05/2013    Procedure: INTRAOPERATIVE TRANSESOPHAGEAL ECHOCARDIOGRAM;  Surgeon: [name], MD;  Location: [facility] OR;  Service: Open Heart Surgery;  Laterality: N/A;  . Cardiac catheterization  06/2013    [facility]     Family History  Problem Relation Age of Onset  . Heart disease Father   . Heart attack Father      History   Social History  . Marital Status: Married    Spouse Name: N/A    Number of Children: N/A  . Years of Education: N/A   Occupational History  . Not on file.   Social History Main Topics  . Smoking status: Never Smoker   . Smokeless tobacco: Not on file  . Alcohol Use: No  . Drug Use: No  . Sexual Activity: Not on file   Other Topics Concern  . Not on file   Social History Narrative  . No narrative on file     PHYSICAL EXAM   BP 118/88  Pulse 69  Ht 5\' 6"  (1.676 m)  Wt 198 lb (89.812 kg)  BMI 31.97 kg/m2 Constitutional: He is oriented to person, place, and time. He appears well-developed and well-nourished. No distress.  HENT: No nasal discharge.  Head: Normocephalic and atraumatic.  Eyes: Pupils are equal and round.  No discharge. Neck: Normal range of motion. Neck supple. No JVD present. No thyromegaly present.  Cardiovascular: Normal rate, regular rhythm, normal heart sounds. Exam reveals no gallop and no friction rub. No murmur heard. Surgical wound is healing nicely. Pulmonary/Chest: Effort normal and breath sounds normal. No stridor. No respiratory distress. He has no wheezes. He has no rales. He exhibits no  tenderness.  Abdominal: Soft. Bowel sounds are normal. He exhibits no distension. There is no tenderness. There is no rebound and no guarding.  Musculoskeletal: Normal range of motion. He exhibits +2 edema on the right side with mild tenderness.  Neurological: He is alert and oriented to person, place, and time. Coordination normal.  Skin: Skin is warm and dry. No rash noted. He is not diaphoretic. No erythema. No pallor.  Psychiatric: He has a normal mood and affect. His behavior is normal. Judgment and thought content normal.       EKG: Normal sinus rhythm with right bundle branch block   ASSESSMENT AND PLAN

## 2013-12-26 NOTE — Assessment & Plan Note (Signed)
He is doing well with no symptoms suggestive of angina. Continue medical therapy. 

## 2013-12-26 NOTE — Assessment & Plan Note (Signed)
Lab Results  Component Value Date   HDL 37* 09/02/2013   LDLCALC 68 09/02/2013   TRIG 102 09/02/2013   CHOLHDL 3.4 09/02/2013   Continue treatment with high dose atorvastatin.

## 2013-12-26 NOTE — Assessment & Plan Note (Signed)
Blood pressure is well controlled on metoprolol. He is no longer tachycardic.

## 2013-12-27 ENCOUNTER — Encounter: Payer: Self-pay | Admitting: Cardiovascular Disease

## 2014-01-22 ENCOUNTER — Other Ambulatory Visit: Payer: Self-pay | Admitting: Physician Assistant

## 2014-04-29 ENCOUNTER — Other Ambulatory Visit: Payer: Self-pay | Admitting: Cardiovascular Disease

## 2014-06-23 ENCOUNTER — Ambulatory Visit (INDEPENDENT_AMBULATORY_CARE_PROVIDER_SITE_OTHER): Payer: BLUE CROSS/BLUE SHIELD | Admitting: Cardiovascular Disease

## 2014-06-23 ENCOUNTER — Encounter: Payer: Self-pay | Admitting: Cardiovascular Disease

## 2014-06-23 VITALS — BP 149/94 | HR 74 | Ht 65.0 in | Wt 199.8 lb

## 2014-06-23 DIAGNOSIS — I2510 Atherosclerotic heart disease of native coronary artery without angina pectoris: Secondary | ICD-10-CM

## 2014-06-23 DIAGNOSIS — I350 Nonrheumatic aortic (valve) stenosis: Secondary | ICD-10-CM

## 2014-06-23 DIAGNOSIS — I10 Essential (primary) hypertension: Secondary | ICD-10-CM

## 2014-06-23 DIAGNOSIS — R079 Chest pain, unspecified: Secondary | ICD-10-CM

## 2014-06-23 DIAGNOSIS — E785 Hyperlipidemia, unspecified: Secondary | ICD-10-CM

## 2014-06-23 MED ORDER — ATORVASTATIN CALCIUM 80 MG PO TABS: ORAL_TABLET | ORAL | Status: DC

## 2014-06-23 MED ORDER — METOPROLOL TARTRATE 100 MG PO TABS: 100.0000 mg | ORAL_TABLET | Freq: Two times a day (BID) | ORAL | Status: DC

## 2014-06-23 NOTE — Assessment & Plan Note (Signed)
He stopped taking atorvastatin on his own without reason other than running out of refills. His lipid profile was excellent on atorvastatin. I resumed this medication.

## 2014-06-23 NOTE — Assessment & Plan Note (Signed)
Blood pressure is mildly elevated. Continue to monitor. 

## 2014-06-23 NOTE — Assessment & Plan Note (Signed)
This was noted on intraoperative TEE with bicuspid leaflets and mild to moderate stenosis. I will plan on repeat echocardiogram later this year.

## 2014-06-23 NOTE — Assessment & Plan Note (Signed)
He is doing very well with no symptoms suggestive of angina. Continue medical therapy. 

## 2014-06-23 NOTE — Progress Notes (Signed)
HPI  This is a 59 year old Hispanic man who is here today for a followup visit regarding coronary artery disease status post CABG in February of 2015. He presented to [facility] with non-ST elevation myocardial infarction. He underwent cardiac catheterization which showed significant three-vessel coronary artery disease with normal ejection fraction. He underwent [facility]G at [facility] without complications. He has been doing very well and denies any chest pain. He ran out of atorvastatin. He has chronic right lower extremity edema since his CABG.  No Known Allergies   Current Outpatient Prescriptions on File Prior to Visit  Medication Sig Dispense Refill  . aspirin EC 325 MG EC tablet Take 1 tablet (325 mg total) by mouth daily. 30 tablet 0  . ibuprofen (ADVIL,MOTRIN) 100 MG tablet Take 200 mg by mouth every 6 (six) hours as needed for pain.      No current facility-administered medications on file prior to visit.     Past Medical History  Diagnosis Date  . Medical history non-contributory   . Glucose intolerance (impaired glucose tolerance)   . MI (myocardial infarction)   . Obesity   . Bronchitis   . Coronary artery disease     Non-ST elevation myocardial infarction cardiac catheterization showed significant three-vessel coronary artery disease. 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 Ejection fraction was 60%.  . Hyperlipidemia   . Hypertension      Past Surgical History  Procedure Laterality Date  . No past surgeries    . Coronary artery bypass graft N/A 07/05/2013    Procedure: CORONARY ARTERY BYPASS GRAFTING (CABG);  Surgeon: Kerin PernaPeter Van Trigt, MD;  Location[facility] OR;  Service: Open Heart Surgery;  Laterality: N/A;  . Intraoperative transesophageal echocardiogram N/A 07/05/2013    Procedure: INTRAOPERATIVE TRANSESOPHAGEAL ECHOCARDIOGRAM;  Surgeon: Kerin PernaPeter Van Trigt, MD;  [facility];  Service: Open Heart Surgery;   Laterality: N/A;  . Cardiac catheterization  06/2013    [facility]     Family History  Problem Relation Age of Onset  . Heart disease Father   . Heart attack Father      History   Social History  . Marital Status: Married    Spouse Name: N/A    Number of Children: N/A  . Years of Education: N/A   Occupational History  . Not on file.   Social History Main Topics  . Smoking status: Never Smoker   . Smokeless tobacco: Not on file  . Alcohol Use: No  . Drug Use: No  . Sexual Activity: Not on file   Other Topics Concern  . Not on file   Social History Narrative     PHYSICAL EXAM   BP 149/94 mmHg  Pulse 74  Ht 5\' 5"  (1.651 m)  Wt 199 lb 12 oz (90.606 kg)  BMI 33.24 kg/m2 Constitutional: He is oriented to person, place, and time. He appears well-developed and well-nourished. No distress.  HENT: No nasal discharge.  Head: Normocephalic and atraumatic.  Eyes: Pupils are equal and round.  No discharge. Neck: Normal range of motion. Neck supple. No JVD present. No thyromegaly present.  Cardiovascular: Normal rate, regular rhythm, normal heart sounds. Exam reveals no gallop and no friction rub. There is 2/6 systolic ejection murmur which is mid peaking in the aortic area.  Pulmonary/Chest: Effort normal and breath sounds normal. No stridor. No respiratory distress. He has no wheezes. He has no  rales. He exhibits no tenderness.  Abdominal: Soft. Bowel sounds are normal. He exhibits no distension. There is no tenderness. There is no rebound and no guarding.  Musculoskeletal: Normal range of motion. He exhibits +2 edema on the right side with mild tenderness.  Neurological: He is alert and oriented to person, place, and time. Coordination normal.  Skin: Skin is warm and dry. No rash noted. He is not diaphoretic. No erythema. No pallor.  Psychiatric: He has a normal mood and affect. His behavior is normal. Judgment and thought content normal.       EKG: Sinus  Rhythm  -Right  bundle branch block.   ABNORMAL    ASSESSMENT AND PLAN

## 2014-06-23 NOTE — Patient Instructions (Signed)
Resume Atorvastatin 80 mg daily.  Continue other medications.   Your physician wants you to follow-up in: 6 months.  You will receive a reminder letter in the mail two months in advance. If you don't receive a letter, please call our office to schedule the follow-up appointment.

## 2014-09-20 NOTE — H&P (Signed)
PATIENT NAME:  James Yates, James Yates MR#:  [id number] DATE OF BIRTH:  1955/06/17  DATE OF ADMISSION:  07/01/2013  PRIMARY CARE PHYSICIAN: None local.  REFERRING PHYSICIAN: Dr. York Cerise.  CHIEF COMPLAINT: Chest pain, 1 week, worsening today.   HISTORY OF PRESENT ILLNESS: A 59 year old Spanish-speaking male with a history of hypertension and glucose intolerance who presented to the ED with the above chief complaint. The patient is alert, awake, oriented, in no acute distress. All information was translated by interpreter.   According to the patient, the patient  has had chest pain for the past 1 week which is in the substernal, sharp, intermittent without radiation. But the symptoms have been worsening today, so the patient comes to the ED for further evaluation. The patient denies any palpitations, orthopnea, nocturnal dyspnea. No leg edema. No weight gain or weight loss. Only has mild cough. Denies any other symptoms.   PAST MEDICAL HISTORY: Hypertension, glucose intolerance, pneumonia in 2006.   SOCIAL HISTORY: No smoking, alcohol drinking or illicit drugs.   FAMILY HISTORY: No hypertension, diabetes, heart attack or stroke.   PAST SURGICAL HISTORY: No.   ALLERGIES: None.  MEDICATIONS: No.  REVIEW OF SYSTEMS:  CONSTITUTIONAL: The patient denies any fever or chills. No headache or dizziness. No weakness.  EYES: No double vision, blurry vision.  ENT: No postnasal drip, slurred speech or dysphagia.  CARDIOVASCULAR: Positive for chest pain but no palpitations, orthopnea, or nocturnal dyspnea. No leg edema. No diaphoresis.  PULMONARY: Mild cough. No shortness of breath or hemoptysis.  GASTROINTESTINAL: No abdominal pain, nausea, vomiting or diarrhea. No melena or bloody stool.  GENITOURINARY: No dysuria, hematuria, or incontinence.  SKIN: No rash or jaundice.  NEUROLOGY: No syncope, loss of consciousness or seizure.  HEMATOLOGY: No easy bruising or bleeding.  ENDOCRINE: No polyuria,  polydipsia, heat or cold intolerance.   PHYSICAL EXAMINATION:  VITAL SIGNS: Temperature 98.4, blood pressure was 202/122 and decreased to 178/111. Pulse 100, O2 saturation 96% on room air.  GENERAL: Alert, awake, oriented, in no acute distress.  HEENT: Pupils round, equal and reactive to light and accommodation. Moist oral mucosa. Clear oropharynx.  NECK: Supple. No JVD or carotid bruits. No lymphadenopathy. No thyromegaly.  CARDIOVASCULAR: S1, S2, regular rate, rhythm. No murmurs.  PULMONARY: Bilateral air entry. No wheezing or rales. No use of accessory muscles to breathe.  ABDOMEN: Soft. No distention or tenderness. No organomegaly. Bowel sounds present.  EXTREMITIES: No edema, clubbing or cyanosis. No calf tenderness. Bilateral pedal pulses present.  SKIN: No rash or jaundice.  NEUROLOGY: A and O x3. No focal deficit. Power 5/5. Sensation intact.   CHEST X-RAY: No acute cardiopulmonary disease.   LABORATORIES: Troponin 0.96. CBC normal. Glucose 104, BUN 12, creatinine 0.94, sodium 134, potassium 3.5, chloride 104, bicarb 29, CK-MB 4.2, INR 0.9.   EKG showed normal sinus rhythm with T-wave inversion in aVF and III, also with mild ST depression on aVF and III.   IMPRESSIONS:  1.  Non-ST-elevation myocardial infarction. 2.  Hypertension malignancy.  3.  Obesity.   PLAN OF TREATMENT:  1.  The patient will be admitted to telemetry floor. The patient was started with a heparin drip in the ED. We will continue. In addition, we will give aspirin, Plavix, and statin, and get an echocardiograph and cardiology consult.  2.  For hypertension, we will start Lopressor IV p.r.n. Lopressor and lisinopril p.o.  3.  Follow up troponin level, lipid panel, TSH.  4.  Glucose. History of glucose intolerance. We will  start a sliding scale. Check hemoglobin A1c.   I discussed the patient's condition and plan of treatment with the patient, the patient's son and wife.   TIME SPENT: About 54 minutes.    ____________________________ James PollackQing Domenico Achord, MD qc:np D: 07/01/2013 21:18:39 ET T: 07/01/2013 22:47:09 ET JOB#: [id number]  cc: James PollackQing Camaron Cammack, MD, <Dictator> James PollackQING Aidynn Krenn MD ELECTRONICALLY SIGNED 07/03/2013 14:59

## 2014-09-20 NOTE — Discharge Summary (Signed)
PATIENT NAME:  James Yates, [name] MR#:  [id number] DATE OF BIRTH:  29-Aug-1955  DATE OF ADMISSION:  07/01/2013 DATE OF DISCHARGE:  07/02/2012  For a detailed note, please take a look at the history and physical done on admission by Dr. Imogene Burnhen.   DISCHARGE DIAGNOSIS:  1.  Non-ST-elevation myocardial infarction. Status post cardiac catheterization showing critical 3 vessel disease.  2.  Hypertension. 3.  Glucose intolerance.   DISCHARGE DIET: The patient is being discharged on a low-sodium, low-fat diet.   DISCHARGE ACTIVITY: As tolerated.  DISPOSITION: The patient is being transferred [facility] for evaluation for coronary artery bypass graft surgery.   CONSULTANTS DURING HOSPITAL COURSE: Dr. Lorine BearsMuhammad Arida from cardiology.  PERTINENT STUDIES DONE DURING THE HOSPITAL COURSE: Chest x-ray on admission showed no acute cardiopulmonary disease.   Echocardiogram showed an ejection fraction of 60% to 65%, normal global LV function, moderately increased LV septal thickness, impaired relaxation pattern of the LV diastolic filling, and mild to moderate aortic valve sclerosis without any evidence of aortic stenosis.   Cardiac catheterization 07/02/2013 showed significant 3 vessel coronary artery disease. The patient noted to have 80% stenosis of the proximal LAD, 90% stenosis of the diagonal I, mid circumflex 99% stenosis, OM1 and OM3 90% and 60% stenosis, respectively. Proximal RCA 50% stenosis. Distal RCA 40% stenosis.   HOSPITAL COURSE: This is a 59 year old male with medical problems as mentioned above who presented to the hospital on 07/01/2013 secondary to chest pain.  1.  Non-ST elevation MI. This was the likely diagnosis given the patient's chest pain and significant elevated troponin. The patient's troponin trended up as high as 2.4. The patient was admitted to the hospital, started on aspirin and Plavix and maintained on a heparin drip along with a statin and beta blocker. A cardiology  consult was obtained. The patient underwent cardiac catheterization which showed critical 3 vessel coronary artery disease. Therefore, he is being transferred to [facility] for evaluation for possible bypass surgery.  2.  Glucose intolerance. The patient's blood sugars remained stable. He was maintained on sliding scale insulin while in the hospital.  3.  Hypertension. The patient remained hemodynamically stable in the hospital. He was maintained on some metoprolol and lisinopril, which he will resume.  4.  Hyperlipidemia. The patient's total cholesterol is over 200, LDL 148. The patient has been initiated on a statin.   The patient is a FULL code. He is being discharged to [facility] for evaluation for coronary artery bypass graft surgery. He is in agreement with this plan. The accepting physician at [facility] is Dr. Jens Somrenshaw.   TIME SPENT WITH TRANSFER: 40 minutes.  ____________________________ Rolly PancakeVivek J. Cherlynn KaiserSainani, MD vjs:sb D: 07/02/2013 14:50:45 ET T: 07/02/2013 15:06:26 ET JOB#: [id number]  cc: Rolly PancakeVivek J. Cherlynn KaiserSainani, MD, <Dictator> Houston SirenVIVEK J SAINANI MD ELECTRONICALLY SIGNED 07/04/2013 11:51

## 2014-09-20 NOTE — Consult Note (Signed)
General Aspect James Yates is a 59yo Hispanic male w/ obesity, HTN otherwise no known PMHx who was admitted to [facility] yesterday w/ NSTEMI.   He is primarily [place] speaking. History was obtained with the assistance of an interpreter. He has no prior cardiac history. Denies prior medical history. Has not seen a physician in 2-3 years. Has had annual physicals previously. Treated for CAP and cellulitis in 2008 at [facility]. 2D echo at that time- preserved EF, mild LVH. No cardiac issues.  He reports experiencing intermittent substernal dull chest pain over the past month. Over the past week, he notes exertional substernal dull chest pain radiating to his back and neck increasing in frequency, duration and severity w/ associated dyspnea. No diaphoresis or nausea. He denies PND, orthopnea, LE edema. No palpitations or syncope. Denies abnormal bleeding, fevers or chills. His symptoms continued to worsen and he was transported to the ED by his family.   Present Illness There, EKG revealed sinus tachycardia, TWIs III, aVF. Initial TnI 0.96. CXR- no active cardiopulmonary disease. BMP- Na 134, otherwise WNL. CBC WNL. He was administered a full-dose ASA and loaded w/ Plavix . Chest pain improved w/ morphine. He was heparinized and admitted to telemetry. Overnight TnI trend: 1.70->2.40. Hgb A1C 6.1%. LDL 148, HDL 38, TG 143, TC 215. Mg 2.0. TSH WNL.  PAST MEDICAL HISTORY None  PAST SURGICAL HISTORY None  ALLERGIES No known drug or food allergies  MEDICATIONS None  SOCIAL HISTORY Denies EtOH, tobacco or illicit drug use. Works as a Engineer, production. Married, 3 children, healthy. Lives in [place], [place].   FAMILY HISTORY Father w/ cardiac surgery (unknown), currently living at 58yo.   Physical Exam:  GEN no acute distress, obese   HEENT pink conjunctivae, PERRL, hearing intact to voice   NECK supple  No masses  trachea midline  no JVD or bruits   RESP normal resp effort  clear BS  no use of accessory  muscles   CARD Regular rate and rhythm  Normal, S1, S2  No murmur   ABD denies tenderness  soft  normal BS   EXTR negative cyanosis/clubbing, negative edema, +2 bilateral radial and femoral pulses, no bruits   SKIN normal to palpation, skin turgor good   NEURO follows commands, motor/sensory function intact   PSYCH alert, A+O to time, place, person   Review of Systems:  Subjective/Chief Complaint chest pain   Cardiovascular: Chest pain or discomfort  Dyspnea   Review of Systems: All other systems were reviewed and found to be negative   Lab Results:  Routine Chem:  03-Feb-15 00:58   Cholesterol, Serum  215  Triglycerides, Serum 143  HDL (INHOUSE)  38  LDL Cholesterol Calculated  148 (Result(s) reported on 02 Jul 2013 at 01:58AM.)  Hemoglobin A1c ([facility]) 6.1 (The American Diabetes Association recommends that a primary goal of therapy should be <7% and that physicians should reevaluate the treatment regimen in patients with HbA1c values consistently >8%.)  Cardiac:  02-Feb-15 00:00   Troponin I - (0.00-0.05 0.05 ng/mL or less: NEGATIVE  Repeat testing in 3-6 hrs  if clinically indicated. >0.05 ng/mL: POTENTIAL  MYOCARDIAL INJURY. Repeat  testing in 3-6 hrs if  clinically indicated. NOTE: An increase or decrease  of 30% or more on serial  testing suggests a  clinically important change)  CPK-MB, Serum - (Result(s) reported on 02 Jul 2013 at 12:01AM.)    16:41   Troponin I  0.96 (0.00-0.05 0.05 ng/mL or less: NEGATIVE  Repeat testing  in 3-6 hrs  if clinically indicated. >0.05 ng/mL: POTENTIAL  MYOCARDIAL INJURY. Repeat  testing in 3-6 hrs if  clinically indicated. NOTE: An increase or decrease  of 30% or more on serial  testing suggests a  clinically important change)  CPK-MB, Serum  4.2 (Result(s) reported on 01 Jul 2013 at 06:58PM.)    20:39   Troponin I  1.70 (0.00-0.05 0.05 ng/mL or less: NEGATIVE  Repeat testing in 3-6 hrs  if clinically  indicated. >0.05 ng/mL: POTENTIAL  MYOCARDIAL INJURY. Repeat  testing in 3-6 hrs if  clinically indicated. NOTE: An increase or decrease  of 30% or more on serial  testing suggests a  clinically important change)  CPK-MB, Serum  4.6 (Result(s) reported on 01 Jul 2013 at 09:33PM.)  03-Feb-15 00:58   Troponin I  2.40 (0.00-0.05 0.05 ng/mL or less: NEGATIVE  Repeat testing in 3-6 hrs  if clinically indicated. >0.05 ng/mL: POTENTIAL  MYOCARDIAL INJURY. Repeat  testing in 3-6 hrs if  clinically indicated. NOTE: An increase or decrease  of 30% or more on serial  testing suggests a  clinically important change)  CPK-MB, Serum  5.2 (Result(s) reported on 02 Jul 2013 at 01:43AM.)   EKG:  Interpretation sinus tachycardia, TWIs III, aVF   Rate 101   EKG Comparision no prior tracing for comparison   Radiology Results: XRay:    02-Feb-15 17:23, Chest PA and Lateral  Chest PA and Lateral   REASON FOR EXAM:    chest pressure  COMMENTS:       PROCEDURE: DXR - DXR CHEST PA (OR AP) AND LATERAL  - Jul 01 2013  5:23PM     CLINICAL DATA:  Chest pain    EXAM:  CHEST  2 VIEW    COMPARISON:  None.    FINDINGS:  The heart size and mediastinal contours are within normal limits.  Both lungs are clear. The visualized skeletal structures are  unremarkable.     IMPRESSION:  No active cardiopulmonary disease.      Electronically Signed    By: Elige KoHetal  Patel    On: 07/01/2013 17:29         Verified By: Joellyn HaffHETAL P. PATEL, M.D., MD    No Known Allergies:   Vital Signs/Nurse's Notes: **Vital Signs.:   03-Feb-15 08:56  Vital Signs Type Routine  Temperature Temperature (F) 97.9  Celsius 36.6  Temperature Source oral  Pulse Pulse 85  Systolic BP Systolic BP 129  Diastolic BP (mmHg) Diastolic BP (mmHg) 80  Mean BP 96  Pulse Ox % Pulse Ox % 96  Pulse Ox Activity Level  At rest  Oxygen Delivery Room Air/ 21 %    Impression 58yo Hispanic male w/ obesity, otherwise no known PMHx  who was admitted to [facility] yesterday w/ NSTEMI.   1. NSTEMI Pt presents after 1 week of accelerating, exertional substernal dull chest pain radiating to back and neck w/ associated dyspnea. Cardiac RFs include obesity, hyperlipidemia (discovered this admission). Objectively, serial troponins reveal a steady up-trend. EKG w/ TWIs in III, aVF- ? RCA dz. Subjective and objective evidence most c/w NSTEMI. Currently pain free, heparinized, clear liquid diet.  -- Plan for cardiac catheterization at 11:30 AM. Risks, benefits and recommendations discussed w/ the patient and family w/ the assistance of an interpreter. The patient has agreed to proceed. Will provide Spanish-interpreted consent form.  -- Continue ASA, Plavix, transition to PO metoprolol, start high-potency statin, NTG SL PRN -- Review 2D echo findings --  Continue heparin  2. Hyperlipidemia LDL 148. -- Recommend high-potency statin given ASCVD per new guidelines- prefer atorvastatin 80  3. Obesity -- Diet & exercise as a means of weight loss and, hence, RF reduction  4. Hypertension Well-controlled.  -- Continue ACEi, BB   Plan The patient was seen and examined with Hurman Horn, PA as well as the intrepreter.  he has classic angina symptoms -   1. CAD:  1 week of progressive chest tightness with exertion.  now presents with severe CP and + troponins.  He is currently  comfortable.  Agree with plans for cath +/- PCI  later today with Dr. Kirke Corin.  I have discussed risks ( including but not limited to bleeding , CVA, MI, need for emergent surgery, allergy, renal failure, death).  the intrepreter has fully explained to patient and family.  He and his family understand and agree to proceed.  2. Hyperlipidemia:  agree with high dose of Lipitor.   Electronic Signatures: Gery Pray (PA-C)   (Signed 03-Feb-15 13:06)  Authored: History and Physical Exam, Review of System, General Aspect/Present Illness, Impression/Plan, Allergies, Labs,  Radiology, Vital Signs/Nurse's Notes, Home Medications, EKG Nahser, Antony Blackbird (MD)   (Signed 03-Feb-15 17:43)  Authored: Impression/Plan  Co-Signer: History and Physical Exam, Review of System, General Aspect/Present Illness, Impression/Plan, Allergies, Labs, Radiology, Vital Signs/Nurse's Notes, Home Medications, EKG  Last Updated: 03-Feb-15 17:43 by Elease Hashimoto Antony Blackbird (MD)

## 2015-01-14 DIAGNOSIS — I10 Essential (primary) hypertension: Secondary | ICD-10-CM | POA: Insufficient documentation

## 2015-01-14 DIAGNOSIS — I25719 Atherosclerosis of autologous vein coronary artery bypass graft(s) with unspecified angina pectoris: Secondary | ICD-10-CM | POA: Insufficient documentation

## 2015-01-14 DIAGNOSIS — E7800 Pure hypercholesterolemia, unspecified: Secondary | ICD-10-CM | POA: Insufficient documentation

## 2015-01-16 ENCOUNTER — Encounter: Payer: Self-pay | Admitting: Cardiovascular Disease

## 2015-01-16 ENCOUNTER — Ambulatory Visit (INDEPENDENT_AMBULATORY_CARE_PROVIDER_SITE_OTHER): Payer: BLUE CROSS/BLUE SHIELD | Admitting: Cardiovascular Disease

## 2015-01-16 VITALS — BP 132/96 | HR 74 | Ht 65.0 in | Wt 195.0 lb

## 2015-01-16 DIAGNOSIS — R0789 Other chest pain: Secondary | ICD-10-CM

## 2015-01-16 DIAGNOSIS — I10 Essential (primary) hypertension: Secondary | ICD-10-CM

## 2015-01-16 DIAGNOSIS — I350 Nonrheumatic aortic (valve) stenosis: Secondary | ICD-10-CM

## 2015-01-16 DIAGNOSIS — E785 Hyperlipidemia, unspecified: Secondary | ICD-10-CM

## 2015-01-16 DIAGNOSIS — I359 Nonrheumatic aortic valve disorder, unspecified: Secondary | ICD-10-CM

## 2015-01-16 DIAGNOSIS — I2510 Atherosclerotic heart disease of native coronary artery without angina pectoris: Secondary | ICD-10-CM

## 2015-01-16 NOTE — Assessment & Plan Note (Signed)
Blood pressure seems to be controlled. He complains of occasional dizziness and I asked him to monitor his blood pressure at home at least 3 times a week.

## 2015-01-16 NOTE — Progress Notes (Signed)
HPI  This is a 59 year old Hispanic man who is here today for a followup visit regarding coronary artery disease status post CABG in February of 2015. He presented to [facility] with non-ST elevation myocardial infarction. He underwent cardiac catheterization which showed significant three-vessel coronary artery disease with normal ejection fraction. He underwent CABG at [facility] without complications. Other medical problems include hypertension, hyperlipidemia and bicuspid aortic valve with mild to moderate stenosis noted on intraoperative TEE. He has been doing very well and denies any chest pain.he describes occasional dizziness but no syncope.   No Known Allergies   Current Outpatient Prescriptions on File Prior to Visit  Medication Sig Dispense Refill  . aspirin EC 325 MG EC tablet Take 1 tablet (325 mg total) by mouth daily. 30 tablet 0  . atorvastatin (LIPITOR) 80 MG tablet TAKE 1 TABLET (80 MG TOTAL) BY MOUTH DAILY AT 6 PM. 30 tablet 6  . ibuprofen (ADVIL,MOTRIN) 100 MG tablet Take 200 mg by mouth every 6 (six) hours as needed for pain.     . metoprolol (LOPRESSOR) 100 MG tablet Take 1 tablet (100 mg total) by mouth 2 (two) times daily. 60 tablet 6   No current facility-administered medications on file prior to visit.     Past Medical History  Diagnosis Date  . Medical history non-contributory   . Glucose intolerance (impaired glucose tolerance)   . MI (myocardial infarction)   . Obesity   . Bronchitis   . Coronary artery disease     Non-ST elevation myocardial infarction cardiac catheterization showed significant three-vessel coronary artery disease. 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 Ejection fraction was 60%.  . Hyperlipidemia   . Hypertension      Past Surgical History  Procedure Laterality Date  . No past surgeries    . Coronary artery bypass graft N/A 07/05/2013    Procedure: CORONARY ARTERY BYPASS  GRAFTING (CABG);  Surgeon: [name], MD;  Location: [facility] OR;  Service: Open Heart Surgery;  Laterality: N/A;  . Intraoperative transesophageal echocardiogram N/A 07/05/2013    Procedure: INTRAOPERATIVE TRANSESOPHAGEAL ECHOCARDIOGRAM;  Surgeon: [name], MD;  Location: [facility] OR;  Service: Open Heart Surgery;  Laterality: N/A;  . Cardiac catheterization  06/2013    [facility]     Family History  Problem Relation Age of Onset  . Heart disease Father   . Heart attack Father      Social History   Social History  . Marital Status: Married    Spouse Name: N/A  . Number of Children: N/A  . Years of Education: N/A   Occupational History  . Not on file.   Social History Main Topics  . Smoking status: Never Smoker   . Smokeless tobacco: Not on file  . Alcohol Use: No  . Drug Use: No  . Sexual Activity: Not on file   Other Topics Concern  . Not on file   Social History Narrative     PHYSICAL EXAM   BP 132/96 mmHg  Pulse 74  Ht  (1.651 m)  Wt 195 lb (88.451 kg)  BMI 32.45 kg/m2 Constitutional: He is oriented to person, place, and time. He appears well-developed and well-nourished. No distress.  HENT: No nasal discharge.  Head: Normocephalic and atraumatic.  Eyes: Pupils are equal and round.  No discharge. Neck: Normal range of motion. Neck supple. No JVD present. No  thyromegaly present.  Cardiovascular: Normal rate, regular rhythm, normal heart sounds. Exam reveals no gallop and no friction rub. There is 2/6 systolic ejection murmur which is mid peaking in the aortic area.  Pulmonary/Chest: Effort normal and breath sounds normal. No stridor. No respiratory distress. He has no wheezes. He has no rales. He exhibits no tenderness.  Abdominal: Soft. Bowel sounds are normal. He exhibits no distension. There is no tenderness. There is no rebound and no guarding.  Musculoskeletal: Normal range of motion. He exhibits +1 edema on the right side with mild tenderness.    Neurological: He is alert and oriented to person, place, and time. Coordination normal.  Skin: Skin is warm and dry. No rash noted. He is not diaphoretic. No erythema. No pallor.  Psychiatric: He has a normal mood and affect. His behavior is normal. Judgment and thought content normal.       EKG: Sinus  Rhythm  -Right bundle branch block.   ABNORMAL    ASSESSMENT AND PLAN

## 2015-01-16 NOTE — Assessment & Plan Note (Signed)
He is doing very well with no symptoms suggestive of angina. Continue medical therapy. 

## 2015-01-16 NOTE — Assessment & Plan Note (Signed)
This was noted on intraoperative TEE with bicuspid leaflets and mild to moderate stenosis. I requested a follow-up echocardiogram.

## 2015-01-16 NOTE — Patient Instructions (Signed)
Medication Instructions:  Your physician recommends that you continue on your current medications as directed. Please refer to the Current Medication list given to you today.   Labwork: none  Testing/Procedures: Your physician has requested that you have an echocardiogram. Echocardiography is a painless test that uses sound waves to create images of your heart. It provides your doctor with information about the size and shape of your heart and how well your heart's chambers and valves are working. This procedure takes approximately one hour. There are no restrictions for this procedure.    Follow-Up: Your physician wants you to follow-up in: six months with Dr. Arida.  You will receive a reminder letter in the mail two months in advance. If you don't receive a letter, please call our office to schedule the follow-up appointment.   Any Other Special Instructions Will Be Listed Below (If Applicable).  Echocardiogram An echocardiogram, or echocardiography, uses sound waves (ultrasound) to produce an image of your heart. The echocardiogram is simple, painless, obtained within a short period of time, and offers valuable information to your health care provider. The images from an echocardiogram can provide information such as:  Evidence of coronary artery disease (CAD).  Heart size.  Heart muscle function.  Heart valve function.  Aneurysm detection.  Evidence of a past heart attack.  Fluid buildup around the heart.  Heart muscle thickening.  Assess heart valve function. LET YOUR HEALTH CARE PROVIDER KNOW ABOUT:  Any allergies you have.  All medicines you are taking, including vitamins, herbs, eye drops, creams, and over-the-counter medicines.  Previous problems you or members of your family have had with the use of anesthetics.  Any blood disorders you have.  Previous surgeries you have had.  Medical conditions you have.  Possibility of pregnancy, if this  applies. BEFORE THE PROCEDURE  No special preparation is needed. Eat and drink normally.  PROCEDURE   In order to produce an image of your heart, gel will be applied to your chest and a wand-like tool (transducer) will be moved over your chest. The gel will help transmit the sound waves from the transducer. The sound waves will harmlessly bounce off your heart to allow the heart images to be captured in real-time motion. These images will then be recorded.  You may need an IV to receive a medicine that improves the quality of the pictures. AFTER THE PROCEDURE You may return to your normal schedule including diet, activities, and medicines, unless your health care provider tells you otherwise. Document Released: 05/13/2000 Document Revised: 09/30/2013 Document Reviewed: 01/21/2013 ExitCare Patient Information 2015 [employer]. This information is not intended to replace advice given to you by your health care provider. Make sure you discuss any questions you have with your health care provider.  

## 2015-01-16 NOTE — Assessment & Plan Note (Signed)
Lab Results  Component Value Date   CHOL 125 09/02/2013   HDL 37* 09/02/2013   LDLCALC 68 09/02/2013   TRIG 102 09/02/2013   CHOLHDL 3.4 09/02/2013   Continue treatment with atorvastatin.

## 2015-01-19 ENCOUNTER — Other Ambulatory Visit: Payer: Self-pay | Admitting: *Deleted

## 2015-01-19 MED ORDER — ATORVASTATIN CALCIUM 80 MG PO TABS: ORAL_TABLET | ORAL | Status: DC

## 2015-01-26 ENCOUNTER — Ambulatory Visit (INDEPENDENT_AMBULATORY_CARE_PROVIDER_SITE_OTHER): Payer: BLUE CROSS/BLUE SHIELD

## 2015-01-26 ENCOUNTER — Other Ambulatory Visit: Payer: Self-pay

## 2015-01-26 DIAGNOSIS — I359 Nonrheumatic aortic valve disorder, unspecified: Secondary | ICD-10-CM | POA: Diagnosis not present

## 2015-04-20 ENCOUNTER — Other Ambulatory Visit: Payer: Self-pay | Admitting: *Deleted

## 2015-04-20 MED ORDER — METOPROLOL TARTRATE 100 MG PO TABS: 100.0000 mg | ORAL_TABLET | Freq: Two times a day (BID) | ORAL | Status: DC

## 2015-05-01 ENCOUNTER — Telehealth: Payer: Self-pay

## 2015-05-01 NOTE — Telephone Encounter (Signed)
Pt daughter called regarding Atorvastatin. States he has 12. She asks if that is for the whole month. Please advise

## 2015-05-04 NOTE — Telephone Encounter (Signed)
Left message on daughter VM to call back

## 2015-05-05 NOTE — Telephone Encounter (Signed)
Left message on machine for patient to contact the office.   

## 2015-05-07 NOTE — Telephone Encounter (Signed)
Left message on pt/daughter voice mail to call back if she still has questions regarding atorvastatin.

## 2015-06-10 ENCOUNTER — Telehealth: Payer: Self-pay | Admitting: *Deleted

## 2015-06-10 ENCOUNTER — Other Ambulatory Visit: Payer: Self-pay | Admitting: *Deleted

## 2015-06-10 MED ORDER — ATORVASTATIN CALCIUM 80 MG PO TABS: ORAL_TABLET | ORAL | Status: DC

## 2015-06-10 NOTE — Telephone Encounter (Signed)
Atorvastatin #90 R#3 refill [facility].

## 2015-06-10 NOTE — Telephone Encounter (Signed)
°*  STAT* If patient is at the pharmacy, call can be transferred to refill team.   1. Which medications need to be refilled? (please list name of each medication and dose if known) cholesterol (forgot the name)   2. Which pharmacy/location (including street and city if local pharmacy) is medication to be sent to? [facility] on [address]   3. Do they need a 30 day or 90 day supply? 90 day

## 2015-07-06 ENCOUNTER — Encounter: Payer: Self-pay | Admitting: Cardiovascular Disease

## 2015-07-06 ENCOUNTER — Ambulatory Visit (INDEPENDENT_AMBULATORY_CARE_PROVIDER_SITE_OTHER): Payer: BLUE CROSS/BLUE SHIELD | Admitting: Cardiovascular Disease

## 2015-07-06 VITALS — BP 130/90 | HR 77 | Ht 64.0 in | Wt 194.8 lb

## 2015-07-06 DIAGNOSIS — I25118 Atherosclerotic heart disease of native coronary artery with other forms of angina pectoris: Secondary | ICD-10-CM

## 2015-07-06 DIAGNOSIS — E785 Hyperlipidemia, unspecified: Secondary | ICD-10-CM

## 2015-07-06 DIAGNOSIS — I159 Secondary hypertension, unspecified: Secondary | ICD-10-CM

## 2015-07-06 DIAGNOSIS — R079 Chest pain, unspecified: Secondary | ICD-10-CM

## 2015-07-06 DIAGNOSIS — R5383 Other fatigue: Secondary | ICD-10-CM | POA: Diagnosis not present

## 2015-07-06 DIAGNOSIS — I350 Nonrheumatic aortic (valve) stenosis: Secondary | ICD-10-CM

## 2015-07-06 NOTE — Progress Notes (Signed)
HPI  This is a 60 year old Hispanic man who is here today for a followup visit regarding coronary artery disease status post CABG in February of 2015. He presented to [facility] with non-ST elevation myocardial infarction. He underwent cardiac catheterization which showed significant three-vessel coronary artery disease with normal ejection fraction. He underwent CABG at [facility] without complications. Other medical problems include hypertension, hyperlipidemia and bicuspid aortic valve with mild to moderate stenosis noted on intraoperative TEE. Most recent echocardiogram in August 2016 showed normal LV systolic function with moderate aortic stenosis. Mean gradient was 19 mmHg with a valve area of 1.1. Over the last few months, he has experienced some vague burning sensation in the right side of his back and occasionally in the chest. Most of the time this happens at rest but occasionally with physical activities. He complains of being fatigued during the day for quality sleep at night. He snores loud according to his family. He is not aware of sleep apnea.   No Known Allergies   Current Outpatient Prescriptions on File Prior to Visit  Medication Sig Dispense Refill  . aspirin EC 325 MG EC tablet Take 1 tablet (325 mg total) by mouth daily. 30 tablet 0  . atorvastatin (LIPITOR) 80 MG tablet TAKE 1 TABLET (80 MG TOTAL) BY MOUTH DAILY AT 6 PM. 90 tablet 3  . ibuprofen (ADVIL,MOTRIN) 100 MG tablet Take 200 mg by mouth every 6 (six) hours as needed for pain.     . metoprolol (LOPRESSOR) 100 MG tablet Take 1 tablet (100 mg total) by mouth 2 (two) times daily. 60 tablet 10  . NON FORMULARY Sleep aid over the counter prn.     No current facility-administered medications on file prior to visit.     Past Medical History  Diagnosis Date  . Medical history non-contributory   . Glucose intolerance (impaired glucose tolerance)   . MI (myocardial infarction) (HCC)   . Obesity   . Bronchitis   . Coronary  artery disease     Non-ST elevation myocardial infarction cardiac catheterization showed significant three-vessel coronary artery disease. 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 Ejection fraction was 60%.  . Hyperlipidemia   . Hypertension   . Aortic valve disease     bicuspid aortic valve with mild stenosis     Past Surgical History  Procedure Laterality Date  . No past surgeries    . Coronary artery bypass graft N/A 07/05/2013    Procedure: CORONARY ARTERY BYPASS GRAFTING (CABG);  Surgeon: [name], MD;  Location: [facility] OR;  Service: Open Heart Surgery;  Laterality: N/A;  . Intraoperative transesophageal echocardiogram N/A 07/05/2013    Procedure: INTRAOPERATIVE TRANSESOPHAGEAL ECHOCARDIOGRAM;  Surgeon: [name], MD;  Location: [facility] OR;  Service: Open Heart Surgery;  Laterality: N/A;  . Cardiac catheterization  06/2013    [facility]     Family History  Problem Relation Age of Onset  . Heart disease Father   . Heart attack Father      Social History   Social History  . Marital Status: Married    Spouse Name: N/A  . Number of Children: N/A  . Years of Education: N/A   Occupational History  . Not on file.   Social History Main Topics  . Smoking status: Never Smoker   . Smokeless tobacco: Not on file  . Alcohol Use: No  . Drug  Use: No  . Sexual Activity: Not on file   Other Topics Concern  . Not on file   Social History Narrative     PHYSICAL EXAM   BP 130/90 mmHg  Pulse 77  Ht  (1.626 m)  Wt 194 lb 12 oz (88.338 kg)  BMI 33.41 kg/m2 Constitutional: He is oriented to person, place, and time. He appears well-developed and well-nourished. No distress.  HENT: No nasal discharge.  Head: Normocephalic and atraumatic.  Eyes: Pupils are equal and round.  No discharge. Neck: Normal range of motion. Neck supple. No JVD present. No thyromegaly present.  Cardiovascular: Normal rate, regular  rhythm, normal heart sounds. Exam reveals no gallop and no friction rub. There is 2/6 systolic ejection murmur which is mid peaking in the aortic area.  Pulmonary/Chest: Effort normal and breath sounds normal. No stridor. No respiratory distress. He has no wheezes. He has no rales. He exhibits no tenderness.  Abdominal: Soft. Bowel sounds are normal. He exhibits no distension. There is no tenderness. There is no rebound and no guarding.  Musculoskeletal: Normal range of motion. He exhibits +1 edema on the right side with mild tenderness.  Neurological: He is alert and oriented to person, place, and time. Coordination normal.  Skin: Skin is warm and dry. No rash noted. He is not diaphoretic. No erythema. No pallor.  Psychiatric: He has a normal mood and affect. His behavior is normal. Judgment and thought content normal.       EKG: Sinus  Rhythm  -Right bundle branch block.   ABNORMAL    ASSESSMENT AND PLAN

## 2015-07-06 NOTE — Assessment & Plan Note (Signed)
The patient reports some vague atypical chest and back discomfort. He also complains of worsening fatigue. Thus, I requested a treadmill nuclear stress test for evaluation. I might consider decreasing the dose of metoprolol in the future.

## 2015-07-06 NOTE — Patient Instructions (Addendum)
Medication Instructions:  Your physician recommends that you continue on your current medications as directed. Please refer to the Current Medication list given to you today.   Labwork: Fasting liver and lipid profile. Nothing to eat or drink after midnight the evening before. BMET, CBC  Testing/Procedures: Your physician has requested that you have a lexiscan myoview. For further information please visit [email or web address] Please follow instruction sheet, as given.  [facility] MYOVIEW  Your caregiver has ordered a Stress Test with nuclear imaging. The purpose of this test is to evaluate the blood supply to your heart muscle. This procedure is referred to as a "Non-Invasive Stress Test." This is because other than having an IV started in your vein, nothing is inserted or "invades" your body. Cardiac stress tests are done to find areas of poor blood flow to the heart by determining the extent of coronary artery disease (CAD). Some patients exercise on a treadmill, which naturally increases the blood flow to your heart, while others who are  unable to walk on a treadmill due to physical limitations have a pharmacologic/chemical stress agent called Lexiscan . This medicine will mimic walking on a treadmill by temporarily increasing your coronary blood flow.   Please note: these test may take anywhere between 2-4 hours to complete  PLEASE REPORT TO [facility] MEDICAL MALL ENTRANCE  THE VOLUNTEERS AT THE FIRST DESK WILL DIRECT YOU WHERE TO GO  Date of Procedure:____Monday, Feb 20__________  Arrival Time for Procedure:______7:45am________________  Instructions regarding medication:    __xx__:  Hold metoprolol the morning of procedure    PLEASE NOTIFY THE OFFICE AT LEAST 24 HOURS IN ADVANCE IF YOU ARE UNABLE TO KEEP YOUR APPOINTMENT.  [phone] AND  PLEASE NOTIFY NUCLEAR MEDICINE AT [facility] AT LEAST 24 HOURS IN ADVANCE IF YOU ARE UNABLE TO KEEP YOUR APPOINTMENT. [phone]  How to prepare for  your Myoview test:   Do not eat or drink after midnight  No caffeine for 24 hours prior to test  No smoking 24 hours prior to test.  Your medication may be taken with water.  If your doctor stopped a medication because of this test, do not take that medication.  Ladies, please do not wear dresses.  Skirts or pants are appropriate. Please wear a short sleeve shirt.  No perfume, cologne or lotion.  Wear comfortable walking shoes. No heels!            Follow-Up: Your physician recommends that you schedule a follow-up appointment in: six month with Dr. Kirke Corin.   Any Other Special Instructions Will Be Listed Below (If Applicable).     If you need a refill on your cardiac medications before your next appointment, please call your pharmacy.  Cardiac Nuclear Scanning A cardiac nuclear scan is used to check your heart for problems, such as the following:  A portion of the heart is not getting enough blood.  Part of the heart muscle has died, which happens with a heart attack.  The heart wall is not working normally.  In this test, a radioactive dye (tracer) is injected into your bloodstream. After the tracer has traveled to your heart, a scanning device is used to measure how much of the tracer is absorbed by or distributed to various areas of your heart. LET [facility] CARE PROVIDER KNOW ABOUT:  Any allergies you have.  All medicines you are taking, including vitamins, herbs, eye drops, creams, and over-the-counter medicines.  Previous problems you or members of your family have had with the  use of anesthetics.  Any blood disorders you have.  Previous surgeries you have had.  Medical conditions you have.  RISKS AND COMPLICATIONS Generally, this is a safe procedure. However, as with any procedure, problems can occur. Possible problems include:   Serious chest pain.  Rapid heartbeat.  Sensation of warmth in your chest. This usually passes quickly. BEFORE THE  PROCEDURE Ask your health care provider about changing or stopping your regular medicines. PROCEDURE This procedure is usually done at a hospital and takes 2-4 hours.  An IV tube is inserted into one of your veins.  Your health care provider will inject a small amount of radioactive tracer through the tube.  You will then wait for 20-40 minutes while the tracer travels through your bloodstream.  You will lie down on an exam table so images of your heart can be taken. Images will be taken for about 15-20 minutes.  You will exercise on a treadmill or stationary bike. While you exercise, your heart activity will be monitored with an electrocardiogram (ECG), and your blood pressure will be checked.  If you are unable to exercise, you may be given a medicine to make your heart beat faster.  When blood flow to your heart has peaked, tracer will again be injected through the IV tube.  After 20-40 minutes, you will get back on the exam table and have more images taken of your heart.  When the procedure is over, your IV tube will be removed. AFTER THE PROCEDURE  You will likely be able to leave shortly after the test. Unless your health care provider tells you otherwise, you may return to your normal schedule, including diet, activities, and medicines.  Make sure you find out how and when you will get your test results.   This information is not intended to replace advice given to you by your health care provider. Make sure you discuss any questions you have with your health care provider.   Document Released: 06/10/2004 Document Revised: 05/21/2013 Document Reviewed: 04/24/2013 Elsevier Interactive Patient Education [employer]

## 2015-07-06 NOTE — Assessment & Plan Note (Signed)
He has bicuspid aortic valve with moderate stenosis at most recent echocardiogram in August 2016. I will plan on repeat echo in one to 2 years.

## 2015-07-06 NOTE — Assessment & Plan Note (Signed)
Continue treatment with atorvastatin. I requested fasting lipid and liver profile. 

## 2015-07-06 NOTE — Assessment & Plan Note (Signed)
Check routine labs. Consider sleep study.

## 2015-07-06 NOTE — Assessment & Plan Note (Signed)
I would consider decreasing metoprolol and adding amlodipine in the future if fatigue persist.

## 2015-07-17 ENCOUNTER — Telehealth: Payer: Self-pay

## 2015-07-17 NOTE — Telephone Encounter (Signed)
Pt is aware of myoview 07/20/15 at [facility] @ 7:45 am. Pt is aware that he is to hold his metoprolol the morning of study. He is aware that he is to check-in at the medical mall w/ registration. He is aware of future orders for lab work and that he can have his labs drawn after he has his myoview at the medical mall.

## 2015-07-19 ENCOUNTER — Inpatient Hospital Stay
Admission: EM | Admit: 2015-07-19 | Discharge: 2015-07-22 | DRG: 871 | Disposition: A | Discharge status: Home / Self Care | Payer: Medicaid Other | Attending: Internal Medicine | Admitting: Internal Medicine

## 2015-07-19 ENCOUNTER — Emergency Department: Payer: Medicaid Other

## 2015-07-19 DIAGNOSIS — R079 Chest pain, unspecified: Secondary | ICD-10-CM | POA: Diagnosis not present

## 2015-07-19 DIAGNOSIS — R000 Tachycardia, unspecified: Secondary | ICD-10-CM | POA: Diagnosis not present

## 2015-07-19 DIAGNOSIS — J029 Acute pharyngitis, unspecified: Secondary | ICD-10-CM | POA: Diagnosis present

## 2015-07-19 DIAGNOSIS — R51 Headache: Secondary | ICD-10-CM

## 2015-07-19 DIAGNOSIS — M549 Dorsalgia, unspecified: Secondary | ICD-10-CM | POA: Diagnosis present

## 2015-07-19 DIAGNOSIS — Z7982 Long term (current) use of aspirin: Secondary | ICD-10-CM

## 2015-07-19 DIAGNOSIS — I25119 Atherosclerotic heart disease of native coronary artery with unspecified angina pectoris: Secondary | ICD-10-CM | POA: Diagnosis present

## 2015-07-19 DIAGNOSIS — I252 Old myocardial infarction: Secondary | ICD-10-CM | POA: Diagnosis not present

## 2015-07-19 DIAGNOSIS — I350 Nonrheumatic aortic (valve) stenosis: Secondary | ICD-10-CM | POA: Diagnosis present

## 2015-07-19 DIAGNOSIS — R509 Fever, unspecified: Secondary | ICD-10-CM

## 2015-07-19 DIAGNOSIS — R519 Headache, unspecified: Secondary | ICD-10-CM

## 2015-07-19 DIAGNOSIS — I10 Essential (primary) hypertension: Secondary | ICD-10-CM | POA: Diagnosis present

## 2015-07-19 DIAGNOSIS — I214 Non-ST elevation (NSTEMI) myocardial infarction: Secondary | ICD-10-CM | POA: Diagnosis present

## 2015-07-19 DIAGNOSIS — E119 Type 2 diabetes mellitus without complications: Secondary | ICD-10-CM | POA: Diagnosis present

## 2015-07-19 DIAGNOSIS — Z951 Presence of aortocoronary bypass graft: Secondary | ICD-10-CM

## 2015-07-19 DIAGNOSIS — Z6831 Body mass index (BMI) 31.0-31.9, adult: Secondary | ICD-10-CM | POA: Diagnosis not present

## 2015-07-19 DIAGNOSIS — A419 Sepsis, unspecified organism: Secondary | ICD-10-CM | POA: Diagnosis present

## 2015-07-19 DIAGNOSIS — E785 Hyperlipidemia, unspecified: Secondary | ICD-10-CM | POA: Diagnosis present

## 2015-07-19 DIAGNOSIS — R778 Other specified abnormalities of plasma proteins: Secondary | ICD-10-CM | POA: Insufficient documentation

## 2015-07-19 DIAGNOSIS — J208 Acute bronchitis due to other specified organisms: Secondary | ICD-10-CM | POA: Diagnosis present

## 2015-07-19 DIAGNOSIS — G8929 Other chronic pain: Secondary | ICD-10-CM | POA: Diagnosis present

## 2015-07-19 DIAGNOSIS — E669 Obesity, unspecified: Secondary | ICD-10-CM | POA: Diagnosis present

## 2015-07-19 DIAGNOSIS — R7989 Other specified abnormal findings of blood chemistry: Secondary | ICD-10-CM

## 2015-07-19 LAB — CBC WITH DIFFERENTIAL/PLATELET
BASOS PCT: 0 %
Basophils Absolute: 0 10*3/uL (ref 0–0.1)
Eosinophils Absolute: 0 10*3/uL (ref 0–0.7)
Eosinophils Relative: 0 %
HEMATOCRIT: 47.3 % (ref 40.0–52.0)
Hemoglobin: 15.8 g/dL (ref 13.0–18.0)
LYMPHS ABS: 0.4 10*3/uL — AB (ref 1.0–3.6)
Lymphocytes Relative: 5 %
MCH: 29 pg (ref 26.0–34.0)
MCHC: 33.3 g/dL (ref 32.0–36.0)
MCV: 86.9 fL (ref 80.0–100.0)
MONO ABS: 0.5 10*3/uL (ref 0.2–1.0)
MONOS PCT: 7 %
NEUTROS ABS: 6.7 10*3/uL — AB (ref 1.4–6.5)
Neutrophils Relative %: 88 %
Platelets: 150 10*3/uL (ref 150–440)
RBC: 5.44 MIL/uL (ref 4.40–5.90)
RDW: 13.6 % (ref 11.5–14.5)
WBC: 7.7 10*3/uL (ref 3.8–10.6)

## 2015-07-19 LAB — COMPREHENSIVE METABOLIC PANEL
ALBUMIN: 4.4 g/dL (ref 3.5–5.0)
ALK PHOS: 93 U/L (ref 38–126)
ALT: 26 U/L (ref 17–63)
ANION GAP: 11 (ref 5–15)
AST: 26 U/L (ref 15–41)
BUN: 13 mg/dL (ref 6–20)
CALCIUM: 8.6 mg/dL — AB (ref 8.9–10.3)
CHLORIDE: 102 mmol/L (ref 101–111)
CO2: 26 mmol/L (ref 22–32)
Creatinine, Ser: 0.91 mg/dL (ref 0.61–1.24)
GFR calc Af Amer: >60 mL/min (ref >60)
GFR calc non Af Amer: >60 mL/min (ref >60)
GLUCOSE: 142 mg/dL — AB (ref 65–99)
Potassium: 3.1 mmol/L — ABNORMAL LOW (ref 3.5–5.1)
SODIUM: 139 mmol/L (ref 135–145)
Total Bilirubin: 1.2 mg/dL (ref 0.3–1.2)
Total Protein: 7.8 g/dL (ref 6.5–8.1)

## 2015-07-19 LAB — URINALYSIS COMPLETE WITH MICROSCOPIC (ARMC ONLY)
BACTERIA UA: NONE SEEN
Bilirubin Urine: NEGATIVE
Glucose, UA: NEGATIVE mg/dL
Hgb urine dipstick: NEGATIVE
Ketones, ur: NEGATIVE mg/dL
LEUKOCYTES UA: NEGATIVE
Nitrite: NEGATIVE
PH: 6 (ref 5.0–8.0)
PROTEIN: NEGATIVE mg/dL
SQUAMOUS EPITHELIAL / LPF: NONE SEEN
Specific Gravity, Urine: 1.012 (ref 1.005–1.030)

## 2015-07-19 LAB — RAPID INFLUENZA A&B ANTIGENS: Influenza A (ARMC): NOT DETECTED

## 2015-07-19 LAB — LACTIC ACID, PLASMA: LACTIC ACID, VENOUS: 2 mmol/L (ref 0.5–2.0)

## 2015-07-19 LAB — RAPID INFLUENZA A&B ANTIGENS (ARMC ONLY): INFLUENZA B (ARMC): NOT DETECTED

## 2015-07-19 LAB — TROPONIN I: Troponin I: 0.19 ng/mL — ABNORMAL HIGH (ref <0.031)

## 2015-07-19 MED ORDER — DEXAMETHASONE SODIUM PHOSPHATE 10 MG/ML IJ SOLN
10.0000 mg | Freq: Once | INTRAMUSCULAR | Status: AC
  Administered 2015-07-19: 10 mg via INTRAVENOUS
  Filled 2015-07-19: qty 1

## 2015-07-19 MED ORDER — CEFTRIAXONE SODIUM 1 G IJ SOLR
1.0000 g | Freq: Once | INTRAMUSCULAR | Status: AC
  Administered 2015-07-19: 1 g via INTRAVENOUS

## 2015-07-19 MED ORDER — SODIUM CHLORIDE 0.9 % IV SOLN
Freq: Once | INTRAVENOUS | Status: AC
  Administered 2015-07-19: 1000 mL via INTRAVENOUS

## 2015-07-19 MED ORDER — DEXTROSE 5 % IV SOLN
1.0000 g | Freq: Once | INTRAVENOUS | Status: AC
  Administered 2015-07-19: 1 g via INTRAVENOUS

## 2015-07-19 MED ORDER — INSULIN ASPART 100 UNIT/ML ~~LOC~~ SOLN: 0.0000 [IU] | Freq: Every day | SUBCUTANEOUS | Status: DC

## 2015-07-19 MED ORDER — ONDANSETRON HCL 4 MG/2ML IJ SOLN: 4.0000 mg | Freq: Four times a day (QID) | INTRAMUSCULAR | Status: DC | PRN

## 2015-07-19 MED ORDER — DEXTROSE 5 % IV SOLN: 2.0000 g | Freq: Once | INTRAVENOUS | Status: DC

## 2015-07-19 MED ORDER — MORPHINE SULFATE (PF) 2 MG/ML IV SOLN
2.0000 mg | INTRAVENOUS | Status: DC | PRN
Start: 2015-07-19 — End: 2015-07-22

## 2015-07-19 MED ORDER — ACETAMINOPHEN 325 MG PO TABS
650.0000 mg | ORAL_TABLET | Freq: Once | ORAL | Status: AC
  Administered 2015-07-19: 650 mg via ORAL

## 2015-07-19 MED ORDER — SODIUM CHLORIDE 0.9% FLUSH
3.0000 mL | Freq: Two times a day (BID) | INTRAVENOUS | Status: DC
  Administered 2015-07-20 – 2015-07-22 (×5): 3 mL via INTRAVENOUS

## 2015-07-19 MED ORDER — POLYETHYLENE GLYCOL 3350 17 G PO PACK: 17.0000 g | PACK | Freq: Every day | ORAL | Status: DC | PRN

## 2015-07-19 MED ORDER — ACETAMINOPHEN 325 MG PO TABS
650.0000 mg | ORAL_TABLET | Freq: Four times a day (QID) | ORAL | Status: DC | PRN
  Administered 2015-07-20 (×2): 650 mg via ORAL
  Filled 2015-07-19 (×2): qty 2

## 2015-07-19 MED ORDER — VANCOMYCIN HCL IN DEXTROSE 1-5 GM/200ML-% IV SOLN
1000.0000 mg | Freq: Once | INTRAVENOUS | Status: AC
  Administered 2015-07-19: 1000 mg via INTRAVENOUS
  Filled 2015-07-19: qty 200

## 2015-07-19 MED ORDER — DEXTROSE 5 % IV SOLN
INTRAVENOUS | Status: AC
  Administered 2015-07-19: 1 g via INTRAVENOUS
  Filled 2015-07-19 (×2): qty 10

## 2015-07-19 MED ORDER — ACETAMINOPHEN 650 MG RE SUPP: 650.0000 mg | Freq: Four times a day (QID) | RECTAL | Status: DC | PRN

## 2015-07-19 MED ORDER — ENOXAPARIN SODIUM 40 MG/0.4ML ~~LOC~~ SOLN
40.0000 mg | SUBCUTANEOUS | Status: DC
  Administered 2015-07-20 – 2015-07-22 (×3): 40 mg via SUBCUTANEOUS
  Filled 2015-07-19 (×5): qty 0.4

## 2015-07-19 MED ORDER — ONDANSETRON HCL 4 MG PO TABS: 4.0000 mg | ORAL_TABLET | Freq: Four times a day (QID) | ORAL | Status: DC | PRN

## 2015-07-19 MED ORDER — HYDROCODONE-ACETAMINOPHEN 5-325 MG PO TABS
1.0000 | ORAL_TABLET | ORAL | Status: DC | PRN
  Administered 2015-07-21: 1 via ORAL
  Filled 2015-07-19: qty 1

## 2015-07-19 MED ORDER — PIPERACILLIN-TAZOBACTAM 3.375 G IVPB
3.3750 g | Freq: Once | INTRAVENOUS | Status: DC
  Administered 2015-07-19: 3.375 g via INTRAVENOUS
  Filled 2015-07-19: qty 50

## 2015-07-19 MED ORDER — ACETAMINOPHEN 325 MG PO TABS
ORAL_TABLET | ORAL | Status: AC
  Administered 2015-07-19: 650 mg via ORAL
  Filled 2015-07-19: qty 2

## 2015-07-19 MED ORDER — INSULIN ASPART 100 UNIT/ML ~~LOC~~ SOLN
0.0000 [IU] | Freq: Three times a day (TID) | SUBCUTANEOUS | Status: DC
  Administered 2015-07-20 (×2): 2 [IU] via SUBCUTANEOUS
  Administered 2015-07-21 – 2015-07-22 (×2): 1 [IU] via SUBCUTANEOUS
  Filled 2015-07-19: qty 2
  Filled 2015-07-19 (×2): qty 1
  Filled 2015-07-19: qty 2

## 2015-07-19 NOTE — ED Notes (Signed)
Patient ambulatory to triage with steady gait, without difficulty or distress noted; pt reports last few days having fever, generalized HA, entire back hurting; 2 advil taken 2hrs PTA; sched for stress test tomorrow due to chest pressure; hx card bypass; denies N/V/D. Denies congestion

## 2015-07-19 NOTE — ED Provider Notes (Signed)
[facility] Emergency Department Provider Note  ____________________________________________  Time seen: Approximately 8:52 PM  I have reviewed the triage vital signs and the nursing notes.   HISTORY  Chief Complaint Code Sepsis    HPI James Yates is a 60 y.o. male who reports mild headache starting yesterday and has gotten worse today. Worse when he coughs. His cough is productive of white phlegm. He also has bilateral back pain in the muscles of the back not in the spine. His neck is supple. He reports he went to [place] 10 days ago spent all this time and LIMA. Checking on the CDC website there is no risk of uvula fever or malaria there supposedly. Headache is an 8 out of 10 increases dramatically when he coughs.   Past Medical History  Diagnosis Date  . Medical history non-contributory   . Glucose intolerance (impaired glucose tolerance)   . MI (myocardial infarction) (HCC)   . Obesity   . Bronchitis   . Coronary artery disease     Non-ST elevation myocardial infarction cardiac catheterization showed significant three-vessel coronary artery disease. 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 Ejection fraction was 60%.  . Hyperlipidemia   . Hypertension   . Aortic valve disease     bicuspid aortic valve with mild stenosis    Patient Active Problem List   Diagnosis Date Noted  . Fatigue 07/06/2015  . Aortic stenosis 06/23/2014  . Leg edema, right 09/02/2013  . Coronary artery disease   . Hyperlipidemia   . Hypertension   . 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 NSTEMI (non-ST elevated myocardial infarction) (HCC) 07/02/2013    Past Surgical History  Procedure Laterality Date  . No past surgeries    . Coronary artery bypass graft N/A 07/05/2013    Procedure: CORONARY ARTERY BYPASS GRAFTING (CABG);  Surgeon: [name], MD;  Location: [facility] OR;  Service: Open Heart Surgery;  Laterality:  N/A;  . Intraoperative transesophageal echocardiogram N/A 07/05/2013    Procedure: INTRAOPERATIVE TRANSESOPHAGEAL ECHOCARDIOGRAM;  Surgeon: [name], MD;  Location: [facility] OR;  Service: Open Heart Surgery;  Laterality: N/A;  . Cardiac catheterization  06/2013    [facility]    Current Outpatient Rx  Name  Route  Sig  Dispense  Refill  . aspirin EC 325 MG EC tablet   Oral   Take 1 tablet (325 mg total) by mouth daily.   30 tablet   0   . atorvastatin (LIPITOR) 80 MG tablet      TAKE 1 TABLET (80 MG TOTAL) BY MOUTH DAILY AT 6 PM.   90 tablet   3   . ibuprofen (ADVIL,MOTRIN) 100 MG tablet   Oral   Take 200 mg by mouth every 6 (six) hours as needed for pain.          . metoprolol (LOPRESSOR) 100 MG tablet   Oral   Take 1 tablet (100 mg total) by mouth 2 (two) times daily.   60 tablet   10   . NON FORMULARY      Sleep aid over the counter prn.           Allergies Review of patient's allergies indicates no known allergies.  Family History  Problem Relation Age of Onset  . Heart disease Father   . Heart attack Father     Social History Social History  Substance  Use Topics  . Smoking status: Never Smoker   . Smokeless tobacco: Not on file  . Alcohol Use: No    Review of Systems Constitutional:fever/chills Eyes: No visual changes. ENT: No sore throat. Cardiovascular: Slight chest tightness with coughing Respiratory: Denies shortness of breath. Gastrointestinal: No abdominal pain.  No nausea, no vomiting.  No diarrhea.  No constipation. Genitourinary: Negative for dysuria. Musculoskeletal: Paraspinous muscle pain especially around the CVA areas Skin: Negative for rash. Neurological: Negative for headaches, focal weakness or numbness.  10-point ROS otherwise negative.  ____________________________________________   PHYSICAL EXAM:  VITAL SIGNS: ED Triage Vitals  Enc Vitals Group     BP 07/19/15 2001 163/106 mmHg     Pulse Rate 07/19/15 2001 136     Resp  07/19/15 2001 18     Temp 07/19/15 2001 101.6 F (38.7 C)     Temp Source 07/19/15 2001 Oral     SpO2 07/19/15 2001 99 %     Weight 07/19/15 2001 194 lb (87.998 kg)     Height 07/19/15 2001  (1.651 m)     Head Cir --      Peak Flow --      Pain Score 07/19/15 2007 9     Pain Loc --      Pain Edu? --      Excl. in GC? --     Constitutional: Alert and oriented. Well appearing and in no acute distress! Eyes: Conjunctivae are normal. PERRL. EOMI. Head: Atraumatic. Nose: No congestion/rhinnorhea. Mouth/Throat: Mucous membranes are moist.  Oropharynx non-erythematous. Neck: No stridor.  No cervical spine tenderness to palpation. Neck is supple Cardiovascular: Normal rate, regular rhythm. Grossly normal heart sounds.  Good peripheral circulation. Respiratory: Normal respiratory effort.  No retractions. Lungs CTAB. Gastrointestinal: Soft and nontender. No distention. No abdominal bruits. Bilateral CVA tenderness. Musculoskeletal: No lower extremity tenderness nor edema.  No joint effusions. Neurologic:  Normal speech and language. No gross focal neurologic deficits are appreciated. No gait instability. Skin:  Skin is warm, dry and intact. No rash noted. Psychiatric: Mood and affect are normal. Speech and behavior are normal.  ____________________________________________   LABS (all labs ordered are listed, but only abnormal results are displayed)  Labs Reviewed  COMPREHENSIVE METABOLIC PANEL - Abnormal; Notable for the following:    Potassium 3.1 (*)    Glucose, Bld 142 (*)    Calcium 8.6 (*)    All other components within normal limits  CBC WITH DIFFERENTIAL/PLATELET - Abnormal; Notable for the following:    Neutro Abs 6.7 (*)    Lymphs Abs 0.4 (*)    All other components within normal limits  TROPONIN I - Abnormal; Notable for the following:    Troponin I 0.19 (*)    All other components within normal limits  URINALYSIS COMPLETEWITH MICROSCOPIC ([facility] ONLY) - Abnormal;  Notable for the following:    Color, Urine YELLOW (*)    APPearance CLEAR (*)    All other components within normal limits  RAPID INFLUENZA A&B ANTIGENS ([facility] ONLY)  CULTURE, BLOOD (ROUTINE X 2)  CULTURE, BLOOD (ROUTINE X 2)  URINE CULTURE  LACTIC ACID, PLASMA  LACTIC ACID, PLASMA   ____________________________________________  EKG  EKG read and interpreted by me shows sinus tachycardia rate of 135 right bundle-branch block otherwise not bad right bundle branch block is old ____________________________________________  RADIOLOGY  Chest x-ray read as some vascular congestion CT of the head read by radiology as no acute disease. I reviewed the  film and discussed the film with radiologist   PROCEDURES  Patient is taking aspirin. He has a stress test scheduled for tomorrow. Patient's headache improves while he's in the emergency room his mental status remains normal he appears to be awake and alert his neck is supple ____________________________________________   INITIAL IMPRESSION / ASSESSMENT AND PLAN / ED COURSE  Pertinent labs & imaging results that were available during my care of the patient were reviewed by me and considered in my medical decision making (see chart for details).   ____________________________________________   FINAL CLINICAL IMPRESSION(S) / ED DIAGNOSES  Final diagnoses:  Tachycardia  Nonintractable episodic headache, unspecified headache type  Other specified fever  Elevated troponin      Arnaldo Natal, MD 07/19/15 2249

## 2015-07-19 NOTE — H&P (Signed)
[facility] -  at [facility]   PATIENT NAME: James Yates    MR#:  [id number]  DATE OF BIRTH:  [birth year]  DATE OF ADMISSION:  07/19/2015  PRIMARY CARE PHYSICIAN: Marisue Ivan, MD   REQUESTING/REFERRING PHYSICIAN: Dr. Darnelle Catalan  CHIEF COMPLAINT:   Chief Complaint  Patient presents with  . Code Sepsis    HISTORY OF PRESENT ILLNESS:  James Yates  is a 60 y.o. male with a known history of hypertension, diabetes, CAD presents to the emergency room complaining of mild headache and fever since yesterday. Patient has had some nausea one episode of diarrhea. No abdominal pain or dysuria. He does have chronic back pain which is the same. Does complain of sore throat and mild cough. No dysphagia or nasal congestion. No recent antibiotic use. No pets at home. Dictated returned recently from [place]. Family members did get over flu recently. Influenza negative in the emergency room. Patient is being admitted for sepsis due to fever and tachycardia. No source of infection found other than his sore throat. Urinalysis is negative and chest x-rays normal.  Has chronic angina, unchanged.  History obtained from patient and partly interpreted by Family.  PAST MEDICAL HISTORY:   Past Medical History  Diagnosis Date  . Medical history non-contributory   . Glucose intolerance (impaired glucose tolerance)   . MI (myocardial infarction) (HCC)   . Obesity   . Bronchitis   . Coronary artery disease     Non-ST elevation myocardial infarction cardiac catheterization showed significant three-vessel coronary artery disease. 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 Ejection fraction was 60%.  . Hyperlipidemia   . Hypertension   . Aortic valve disease     bicuspid aortic valve with mild stenosis    PAST SURGICAL HISTORY:   Past Surgical History  Procedure Laterality Date  . No past  surgeries    . Coronary artery bypass graft N/A 07/05/2013    Procedure: CORONARY ARTERY BYPASS GRAFTING (CABG);  Surgeon: [name], MD;  Location: [facility] OR;  Service: Open Heart Surgery;  Laterality: N/A;  . Intraoperative transesophageal echocardiogram N/A 07/05/2013    Procedure: INTRAOPERATIVE TRANSESOPHAGEAL ECHOCARDIOGRAM;  Surgeon: [name], MD;  Location: [facility] OR;  Service: Open Heart Surgery;  Laterality: N/A;  . Cardiac catheterization  06/2013    [facility]    SOCIAL HISTORY:   Social History  Substance Use Topics  . Smoking status: Never Smoker   . Smokeless tobacco: Not on file  . Alcohol Use: No    FAMILY HISTORY:   Family History  Problem Relation Age of Onset  . Heart disease Father   . Heart attack Father     DRUG ALLERGIES:  No Known Allergies  REVIEW OF SYSTEMS:   Review of Systems  Constitutional: Positive for fever, chills and malaise/fatigue. Negative for weight loss.  HENT: Positive for sore throat. Negative for hearing loss and nosebleeds.   Eyes: Negative for blurred vision, double vision and pain.  Respiratory: Negative for cough, hemoptysis, sputum production, shortness of breath and wheezing.   Cardiovascular: Negative for chest pain, palpitations, orthopnea and leg swelling.  Gastrointestinal: Positive for nausea. Negative for vomiting, abdominal pain, diarrhea and constipation.  Genitourinary: Negative for dysuria and hematuria.  Musculoskeletal: Negative for myalgias, back pain and falls.  Skin: Negative for rash.  Neurological: Negative for dizziness, tremors, sensory change, speech change, focal weakness,  seizures and headaches.  Endo/Heme/Allergies: Does not bruise/bleed easily.  Psychiatric/Behavioral: Negative for depression and memory loss. The patient is not nervous/anxious.     MEDICATIONS AT HOME:   Prior to Admission medications   Medication Sig Start Date End Date Taking? Authorizing Provider  aspirin EC 325 MG EC tablet Take 1  tablet (325 mg total) by mouth daily. 07/11/13   Erin R Barrett, PA-C  atorvastatin (LIPITOR) 80 MG tablet TAKE 1 TABLET (80 MG TOTAL) BY MOUTH DAILY AT 6 PM. 06/10/15   Iran Ouch, MD  ibuprofen (ADVIL,MOTRIN) 100 MG tablet Take 200 mg by mouth every 6 (six) hours as needed for pain.     Historical Provider, MD  metoprolol (LOPRESSOR) 100 MG tablet Take 1 tablet (100 mg total) by mouth 2 (two) times daily. 04/20/15   Iran Ouch, MD  NON FORMULARY Sleep aid over the counter prn.    Historical Provider, MD      VITAL SIGNS:  Blood pressure 147/96, pulse 112, temperature 101.6 F (38.7 C), temperature source Oral, resp. rate 18, height  (1.651 m), weight 87.998 kg (194 lb), SpO2 94 %.  PHYSICAL EXAMINATION:  Physical Exam  GENERAL:  60 y.o.-year-old patient lying in the bed with no acute distress.  EYES: Pupils equal, round, reactive to light and accommodation. No scleral icterus. Extraocular muscles intact.  HEENT: Head atraumatic, normocephalic. Oropharynx and nasopharynx clear. No oropharyngeal erythema, moist oral mucosa . No neck stiffness. NECK:  Supple, no jugular venous distention. No thyroid enlargement, no tenderness.  LUNGS: Normal breath sounds bilaterally, no wheezing, rales, rhonchi. No use of accessory muscles of respiration.  CARDIOVASCULAR: S1, S2 normal. No murmurs, rubs, or gallops.  ABDOMEN: Soft, nontender, nondistended. Bowel sounds present. No organomegaly or mass.  EXTREMITIES: No pedal edema, cyanosis, or clubbing. + 2 pedal & radial pulses b/l.   NEUROLOGIC: Cranial nerves II through XII are intact. No focal Motor or sensory deficits appreciated b/l. PSYCHIATRIC: The patient is alert and oriented x 3. Good affect. SKIN: No obvious rash, lesion, or ulcer.  LABORATORY PANEL:   CBC  Recent Labs Lab 07/19/15 2019  WBC 7.7  HGB 15.8  HCT 47.3  PLT 150    ------------------------------------------------------------------------------------------------------------------  Chemistries   Recent Labs Lab 07/19/15 2019  NA 139  K 3.1*  CL 102  CO2 26  GLUCOSE 142*  BUN 13  CREATININE 0.91  CALCIUM 8.6*  AST 26  ALT 26  ALKPHOS 93  BILITOT 1.2   ------------------------------------------------------------------------------------------------------------------  Cardiac Enzymes  Recent Labs Lab 07/19/15 2019  TROPONINI 0.19*   ------------------------------------------------------------------------------------------------------------------  RADIOLOGY:  Ct Head Wo Contrast  07/19/2015  CLINICAL DATA:  Acute onset of fever, generalized headache and back pain. Initial encounter. EXAM: CT HEAD WITHOUT CONTRAST TECHNIQUE: Contiguous axial images were obtained from the base of the skull through the vertex without intravenous contrast. COMPARISON:  None. FINDINGS: There is no evidence of acute infarction, mass lesion, or intra- or extra-axial hemorrhage on CT. The posterior fossa, including the cerebellum, brainstem and fourth ventricle, is within normal limits. The third and lateral ventricles, and basal ganglia are unremarkable in appearance. The cerebral hemispheres are symmetric in appearance, with normal gray-white differentiation. No mass effect or midline shift is seen. There is no evidence of fracture; visualized osseous structures are unremarkable in appearance. The orbits are within normal limits. The paranasal sinuses and mastoid air cells are well-aerated. No significant soft tissue abnormalities are seen. IMPRESSION: Unremarkable noncontrast CT of the head.  Electronically Signed   By: Roanna Raider M.D.   On: 07/19/2015 22:27   Dg Chest Port 1 View  07/19/2015  CLINICAL DATA:  Shortness of breath and cough 2 days.  Back pain. EXAM: PORTABLE CHEST 1 VIEW COMPARISON:  08/01/2013 FINDINGS: Sternotomy wires unchanged. Exam demonstrates  hypoinflated lungs with minimal hazy prominence of the perihilar markings suggesting mild vascular congestion. Mild stable cardiomegaly. Remainder of the exam is unchanged. IMPRESSION: Hypoinflation with mild stable cardiomegaly and suggestion of mild vascular congestion. Electronically Signed   By: Elberta Fortis M.D.   On: 07/19/2015 20:53     IMPRESSION AND PLAN:   * Sepsis - likely from sore throat. IVF. The lactic acid IV ceftriaxone Blood cx. Flu negative Consult ID due to recent travel history No rash, neck stiffness.  * Elevated troponin Likely demand ischemia. Repeat troponin. Consult cardiology. She was supposed to get a stress test tomorrow which will be held. Further management as per her troponin trends and cardiology input.  * HTN Continue meds  * Impaired Glucose tolerance Chart sliding scale insulin  * DVT prophylaxis Lovenox   All the records are reviewed and case discussed with ED provider. Management plans discussed with the patient, family and they are in agreement.  CODE STATUS: FULL  TOTAL TIME TAKING CARE OF THIS PATIENT: 40 minutes.   Milagros Loll R M.D on 07/19/2015 at 11:56 PM  Between 7am to 6pm - Pager - [phone]  After 6pm go to [email or web address] - password [email or web address] [facility]  [place] [facility]  Office  [phone]  CC: Primary care physician; Marisue Ivan, MD  Note: This dictation was prepared with Dragon dictation along with smaller phrase technology. Any transcriptional errors that result from this process are unintentional.

## 2015-07-20 ENCOUNTER — Inpatient Hospital Stay (HOSPITAL_COMMUNITY)
Admit: 2015-07-20 | Discharge: 2015-07-20 | Disposition: A | Discharge status: Home / Self Care | Payer: Medicaid Other | Attending: Cardiovascular Disease | Admitting: Cardiovascular Disease

## 2015-07-20 ENCOUNTER — Encounter: Payer: Self-pay | Admitting: *Deleted

## 2015-07-20 ENCOUNTER — Ambulatory Visit: Admission: RE | Admit: 2015-07-20 | Payer: Self-pay | Source: Ambulatory Visit

## 2015-07-20 DIAGNOSIS — R778 Other specified abnormalities of plasma proteins: Secondary | ICD-10-CM | POA: Insufficient documentation

## 2015-07-20 DIAGNOSIS — R079 Chest pain, unspecified: Secondary | ICD-10-CM

## 2015-07-20 DIAGNOSIS — R7989 Other specified abnormal findings of blood chemistry: Secondary | ICD-10-CM

## 2015-07-20 LAB — BASIC METABOLIC PANEL
Anion gap: 5 (ref 5–15)
BUN: 16 mg/dL (ref 6–20)
CO2: 27 mmol/L (ref 22–32)
Calcium: 8.4 mg/dL — ABNORMAL LOW (ref 8.9–10.3)
Chloride: 107 mmol/L (ref 101–111)
Creatinine, Ser: 1 mg/dL (ref 0.61–1.24)
GFR calc Af Amer: >60 mL/min (ref >60)
GFR calc non Af Amer: >60 mL/min (ref >60)
Glucose, Bld: 120 mg/dL — ABNORMAL HIGH (ref 65–99)
Potassium: 3.5 mmol/L (ref 3.5–5.1)
Sodium: 139 mmol/L (ref 135–145)

## 2015-07-20 LAB — TROPONIN I
TROPONIN I: 0.22 ng/mL — AB (ref <0.031)
TROPONIN I: 0.28 ng/mL — AB (ref <0.031)

## 2015-07-20 LAB — GLUCOSE, CAPILLARY
GLUCOSE-CAPILLARY: 159 mg/dL — AB (ref 65–99)
GLUCOSE-CAPILLARY: 108 mg/dL — AB (ref 65–99)
Glucose-Capillary: 219 mg/dL — ABNORMAL HIGH (ref 65–99)
Glucose-Capillary: 156 mg/dL — ABNORMAL HIGH (ref 65–99)
Glucose-Capillary: 108 mg/dL — ABNORMAL HIGH (ref 65–99)

## 2015-07-20 LAB — MRSA PCR SCREENING: MRSA BY PCR: NEGATIVE

## 2015-07-20 LAB — LACTIC ACID, PLASMA
Lactic Acid, Venous: 3 mmol/L (ref 0.5–2.0)
Lactic Acid, Venous: 1.1 mmol/L (ref 0.5–2.0)

## 2015-07-20 MED ORDER — SODIUM CHLORIDE 0.9 % IV BOLUS (SEPSIS): 1000.0000 mL | Freq: Once | INTRAVENOUS | Status: DC

## 2015-07-20 MED ORDER — METOPROLOL TARTRATE 100 MG PO TABS
100.0000 mg | ORAL_TABLET | Freq: Two times a day (BID) | ORAL | Status: DC
  Administered 2015-07-20 – 2015-07-22 (×5): 100 mg via ORAL
  Filled 2015-07-20: qty 1
  Filled 2015-07-20: qty 2
  Filled 2015-07-20 (×3): qty 1

## 2015-07-20 MED ORDER — IBUPROFEN 400 MG PO TABS: 200.0000 mg | ORAL_TABLET | Freq: Four times a day (QID) | ORAL | Status: DC | PRN

## 2015-07-20 MED ORDER — SODIUM CHLORIDE 0.9 % IV SOLN
Freq: Once | INTRAVENOUS | Status: AC
  Administered 2015-07-20: 1000 mL via INTRAVENOUS

## 2015-07-20 MED ORDER — ASPIRIN EC 325 MG PO TBEC
325.0000 mg | DELAYED_RELEASE_TABLET | Freq: Every day | ORAL | Status: DC
  Administered 2015-07-20 – 2015-07-22 (×3): 325 mg via ORAL
  Filled 2015-07-20 (×3): qty 1

## 2015-07-20 MED ORDER — ATORVASTATIN CALCIUM 20 MG PO TABS
40.0000 mg | ORAL_TABLET | Freq: Every day | ORAL | Status: DC
  Administered 2015-07-20 – 2015-07-21 (×2): 40 mg via ORAL
  Filled 2015-07-20 (×2): qty 2

## 2015-07-20 MED ORDER — DEXTROSE 5 % IV SOLN
2.0000 g | INTRAVENOUS | Status: DC
  Administered 2015-07-20: 2 g via INTRAVENOUS
  Filled 2015-07-20 (×2): qty 2

## 2015-07-20 NOTE — Care Management (Signed)
Admitted with sx concerning for sepsis.  Reason for placement in icu not clear.  It is reported during progression that patient has had a recent trip to [place].  His temp has been as high as 101 and heart rate 136. Elevated troponins .   Has history of previous CABG. Patient was to have had an outpatient stress test today but now is cancelled.

## 2015-07-20 NOTE — Progress Notes (Signed)
*  PRELIMINARY RESULTS* °Echocardiogram °2D Echocardiogram has been performed. ° °James Yates °07/20/2015, 8:08 PM °

## 2015-07-20 NOTE — Progress Notes (Signed)
Pt continues to have a non productive cough at this time. Requested pain medication for headache. Administered metoprolol this AM for elevated BP, but did have 1000 ml NS bolus that was started in the ED and finished on the floor during admission. Will need interpreter to understand medical information. Wife at bedside.

## 2015-07-20 NOTE — Progress Notes (Signed)
Skin verified upon transfer by Hiral of CCU.

## 2015-07-20 NOTE — Consult Note (Signed)
Cardiology Consult    Patient ID: James Yates MRN: [id number], DOB/AGE: January 12, 1956   Admit date: 07/19/2015 Date of Consult: 07/20/2015  Primary Physician: Marisue Ivan, MD Reason for Consult: Elevated Troponin Primary Cardiologist: Dr. Kirke Corin Requesting Provider: Dr. Elpidio Anis   History of Present Illness   James Yates is a 60 y.o. male with past medical history of CAD (s/p CABG in 06/2013 with LIMA-LAD, SVG-rPDA, and SVG-OM1 and OM3), HTN, HLD, and mild aortic stenosis who presented to [facility] on 07/19/2015 for fever, fatigue, and headaches for the past two days.  I offered to get an interpreter for this encounter but the patient's daughter, sister, and brother-in law were at the bedside and he preferred his daughter interpret instead.  The patient reports he returned from a trip to [place] on 07/16/2015 and was doing well until he developed sudden onset fevers, diaphoresis, and a headache on 07/18/2015. The headache he reports is behind his eyes bilaterally. Denies any changes in vision. Also reports having a sore throat. Due to his symptoms persisting through the next day, his family encouraged him to come to the ED for evaluation.  Upon arrival to the ED, his temperature was 101.6 and he was tachycardiac into the 130's. Initial Lactic Acid was 2.0, repeat value 3.0. WBC 7.7. Hgb 15.8. Platelets 150. Cyclic troponin values have been 0.19 and 0.22 thus far. Rapid flu was negative. UA without significant abnormalities. CXR with mild stable cardiomegaly and suggestion of mild vascular congestion. Head CT with no acute abnormalities noted. He was admitted for Sepsis and started on IV Ceftriaxone.  He was recently seen in the office by Dr. Kirke Corin on 07/06/2015 and reported a burning sensation along his chest and upper back along with worsening fatigue. A treadmill NST was ordered at that time to further evaluate his symptoms and was scheduled for this morning, but this has been postponed for  now due to his acute illness.  He still reports having occasional episodes of chest pressure over the past several days, occurring mostly at rest. He has not noticed an exertional component. He says the pressure only lasts a few minutes and is worse with coughing.    Past Medical History   Past Medical History  Diagnosis Date  . Medical history non-contributory   . Glucose intolerance (impaired glucose tolerance)   . MI (myocardial infarction) (HCC)   . Obesity   . Bronchitis   . Coronary artery disease     Non-ST elevation myocardial infarction cardiac catheterization showed significant three-vessel coronary artery disease. 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 Ejection fraction was 60%.  . Hyperlipidemia   . Hypertension   . Aortic valve disease     bicuspid aortic valve with mild stenosis    Past Surgical History  Procedure Laterality Date  . No past surgeries    . Coronary artery bypass graft N/A 07/05/2013    Procedure: CORONARY ARTERY BYPASS GRAFTING (CABG);  Surgeon: [name], MD;  Location: [facility] OR;  Service: Open Heart Surgery;  Laterality: N/A;  . Intraoperative transesophageal echocardiogram N/A 07/05/2013    Procedure: INTRAOPERATIVE TRANSESOPHAGEAL ECHOCARDIOGRAM;  Surgeon: [name], MD;  Location: [facility] OR;  Service: Open Heart Surgery;  Laterality: N/A;  . Cardiac catheterization  06/2013    [facility]     Allergies  No Known Allergies  Inpatient Medications    . aspirin EC  325 mg Oral Daily  . atorvastatin  40 mg Oral q1800  . cefTRIAXone (ROCEPHIN)  IV  2 g Intravenous Q24H  . enoxaparin (LOVENOX) injection  40 mg Subcutaneous Q24H  . insulin aspart  0-5 Units Subcutaneous QHS  . insulin aspart  0-9 Units Subcutaneous TID WC  . metoprolol  100 mg Oral BID  . sodium chloride  1,000 mL Intravenous Once  . sodium chloride flush  3 mL Intravenous Q12H    Family History    Family  History  Problem Relation Age of Onset  . Heart disease Father   . Heart attack Father     Social History    Social History   Social History  . Marital Status: Married    Spouse Name: N/A  . Number of Children: N/A  . Years of Education: N/A   Occupational History  . Not on file.   Social History Main Topics  . Smoking status: Never Smoker   . Smokeless tobacco: Not on file  . Alcohol Use: No  . Drug Use: No  . Sexual Activity: Not on file   Other Topics Concern  . Not on file   Social History Narrative     Review of Systems    General:  No night sweats or weight changes. Positive for fever and chills. Cardiovascular:  No dyspnea on exertion, edema, orthopnea, palpitations, paroxysmal nocturnal dyspnea. Positive for chest pressure. Dermatological: No rash, lesions/masses Respiratory: No cough, dyspnea Urologic: No hematuria, dysuria Abdominal:   No nausea, vomiting, diarrhea, bright red blood per rectum, melena, or hematemesis Neurologic:  No visual changes, wkns, changes in mental status. Positive for headaches. All other systems reviewed and are otherwise negative except as noted above.  Physical Exam    Blood pressure 124/87, pulse 79, temperature 98.7 F (37.1 C), temperature source Oral, resp. rate 17, height 5\' 5"  (1.651 m), weight 194 lb (87.998 kg), SpO2 94 %.  General: Pleasant, Hispanic male appearing in NAD Psych: Normal affect. Neuro: Alert and oriented X 3. Moves all extremities spontaneously. HEENT: Normal  Neck: Supple without bruits or JVD. Lungs:  Resp regular and unlabored, CTA without wheezing or rales. Heart: RRR no s3, s4, 2/6 SEM at RUSB. Abdomen: Soft, non-tender, non-distended, BS + x 4.  Extremities: No clubbing, cyanosis or edema. DP/PT/Radials 2+ and equal bilaterally.  Labs    Troponin (Point of Care Test) No results for input(s): TROPIPOC in the last 72 hours.  Recent Labs  07/19/15 2019 07/20/15 0330  TROPONINI 0.19*  0.22*   Lab Results  Component Value Date   WBC 7.7 07/19/2015   HGB 15.8 07/19/2015   HCT 47.3 07/19/2015   MCV 86.9 07/19/2015   PLT 150 07/19/2015    Recent Labs Lab 07/19/15 2019  NA 139  K 3.1*  CL 102  CO2 26  BUN 13  CREATININE 0.91  CALCIUM 8.6*  PROT 7.8  BILITOT 1.2  ALKPHOS 93  ALT 26  AST 26  GLUCOSE 142*   Lab Results  Component Value Date   CHOL 125 09/02/2013   HDL 37* 09/02/2013   LDLCALC 68 09/02/2013   TRIG 102 09/02/2013    Radiology Studies    Ct Head Wo Contrast: 07/19/2015  CLINICAL DATA:  Acute onset of fever, generalized headache and back pain. Initial encounter. EXAM: CT HEAD WITHOUT CONTRAST TECHNIQUE: Contiguous axial images were obtained from the base of the skull through the vertex without intravenous contrast. COMPARISON:  None. FINDINGS: There is  no evidence of acute infarction, mass lesion, or intra- or extra-axial hemorrhage on CT. The posterior fossa, including the cerebellum, brainstem and fourth ventricle, is within normal limits. The third and lateral ventricles, and basal ganglia are unremarkable in appearance. The cerebral hemispheres are symmetric in appearance, with normal gray-white differentiation. No mass effect or midline shift is seen. There is no evidence of fracture; visualized osseous structures are unremarkable in appearance. The orbits are within normal limits. The paranasal sinuses and mastoid air cells are well-aerated. No significant soft tissue abnormalities are seen. IMPRESSION: Unremarkable noncontrast CT of the head. Electronically Signed   By: Roanna Raider M.D.   On: 07/19/2015 22:27   Dg Chest Port 1 View: 07/19/2015  CLINICAL DATA:  Shortness of breath and cough 2 days.  Back pain. EXAM: PORTABLE CHEST 1 VIEW COMPARISON:  08/01/2013 FINDINGS: Sternotomy wires unchanged. Exam demonstrates hypoinflated lungs with minimal hazy prominence of the perihilar markings suggesting mild vascular congestion. Mild stable  cardiomegaly. Remainder of the exam is unchanged. IMPRESSION: Hypoinflation with mild stable cardiomegaly and suggestion of mild vascular congestion. Electronically Signed   By: Elberta Fortis M.D.   On: 07/19/2015 20:53    EKG & Cardiac Imaging    EKG: Sinus tachycardia, HR in 130's. RBBB. TWI in anterior and lateral leads.  Echocardiogram: 01/26/2015 Study Conclusions - Left ventricle: The cavity size was normal. Systolic function was normal. The estimated ejection fraction was in the range of 60% to 65%. Wall motion was normal; there were no regional wall motion abnormalities. Left ventricular diastolic function parameters were normal. - Aortic valve: Transvalvular velocity was increased. There was mild to moderate stenosis. Mean gradient (S): 19 mm Hg. Peak gradient (S): 42 mm Hg. - Mitral valve: There was mild regurgitation. - Left atrium: The atrium was normal in size. - Right ventricle: Systolic function was normal. - Pulmonary arteries: Systolic pressure was within the normal range.  Assessment & Plan     1. Elevated Troponin/ History of CAD - s/p CABG in 06/2013 with LIMA-LAD, SVG-rPDA, and SVG-OM1 and OM3. Has been experiencing episodes of chest pressure for the past month, slightly worse with coughing, but he reports the pressure has resembled his previous MI's. - troponin values have been 0.19 and 0.22 thus far. - had initially been scheduled for an outpatient exercise NST today but this has been postponed. Consider obtaining inpatient Lexiscan once his acute symptoms improve. Could also repeat his echocardiogram to assess for any significant changes in LV function or wall motion. - continue statin and BB.  2. HTN - BP has been 108/701 - 163/110 while admitted - continue PTA Lopressor  BID. Consider adding low-dose ACE-I or Amlodipine if BP remains elevated.  3. HLD - continue statin therapy  4. Sepsis - presented with fever of 101.6 and  tachycardia into the 130's. Lactic Acid elevated to 3.0. Rapid flu negative and UA and CXR without specific etiology for his symptoms. Head CT with no acute abnormalities noted. - currently on IV Ceftriaxone - per admitting team   Signed, Ellsworth Lennox, PA-C 07/20/2015, 9:02 AM Pager: [phone]

## 2015-07-20 NOTE — Progress Notes (Signed)
Specimen for Zika contamination was collected by lab 1730   [facility] sample collection form was filled out.  CDC notified at 1735 and urgent message left per protocol  CDC returned page at 1745, recommendations given, acknowledge test being sent.  1750 Dr. Sampson Goon notified of CDC recommendations and paperwork being completed.  No new orders given at this time.    Pt remains stable at this time on room air. NSR with no new complaints of pain. Report given to Hiral on coming RN.

## 2015-07-20 NOTE — Progress Notes (Signed)
[facility] - [place] at [facility]   PATIENT NAME: James Yates    MR#:  [id number]  DATE OF BIRTH:  07-28-1955  SUBJECTIVE: admitted for code sepsis.had  sore throat and fever at home. So far cultures returned negative. No further fevers. Does have a lot of dry cough and headache due to cough.   CHIEF COMPLAINT:   Chief Complaint  Patient presents with  . Code Sepsis    REVIEW OF SYSTEMS:    Review of Systems  Constitutional: Positive for chills. Negative for fever.  HENT: Negative for hearing loss.   Eyes: Negative for blurred vision, double vision and photophobia.  Respiratory: Positive for cough. Negative for hemoptysis and shortness of breath.   Cardiovascular: Negative for palpitations, orthopnea and leg swelling.  Gastrointestinal: Negative for vomiting, abdominal pain and diarrhea.  Genitourinary: Negative for dysuria and urgency.  Musculoskeletal: Negative for myalgias and neck pain.  Skin: Negative for rash.  Neurological: Negative for dizziness, focal weakness, seizures, weakness and headaches.  Psychiatric/Behavioral: Negative for memory loss. The patient does not have insomnia.     Nutrition:  Tolerating Diet: Tolerating PT:      DRUG ALLERGIES:  No Known Allergies  VITALS:  Blood pressure 124/87, pulse 79, temperature 98.7 F (37.1 C), temperature source Oral, resp. rate 17, height  (1.651 m), weight 87.998 kg (194 lb), SpO2 94 %.  PHYSICAL EXAMINATION:   Physical Exam  GENERAL:  60 y.o.-year-old patient lying in the bed with no acute distress.  EYES: Pupils equal, round, reactive to light and accommodation. No scleral icterus. Extraocular muscles intact.  HEENT: Head atraumatic, normocephalic. Oropharynx and nasopharynx clear.  NECK:  Supple, no jugular venous distention. No thyroid enlargement, no tenderness.  LUNGS: Normal breath sounds bilaterally, no wheezing, rales,rhonchi or crepitation. No use of accessory  muscles of respiration.  CARDIOVASCULAR: S1, S2 normal. No murmurs, rubs, or gallops.  ABDOMEN: Soft, nontender, nondistended. Bowel sounds present. No organomegaly or mass.  EXTREMITIES: No pedal edema, cyanosis, or clubbing.  NEUROLOGIC: Cranial nerves II through XII are intact. Muscle strength 5/5 in all extremities. Sensation intact. Gait not checked.  PSYCHIATRIC: The patient is alert and oriented x 3.  SKIN: No obvious rash, lesion, or ulcer.    LABORATORY PANEL:   CBC  Recent Labs Lab 07/19/15 2019  WBC 7.7  HGB 15.8  HCT 47.3  PLT 150   ------------------------------------------------------------------------------------------------------------------  Chemistries   Recent Labs Lab 07/19/15 2019  NA 139  K 3.1*  CL 102  CO2 26  GLUCOSE 142*  BUN 13  CREATININE 0.91  CALCIUM 8.6*  AST 26  ALT 26  ALKPHOS 93  BILITOT 1.2   ------------------------------------------------------------------------------------------------------------------  Cardiac Enzymes  Recent Labs Lab 07/20/15 0956  TROPONINI 0.28*   ------------------------------------------------------------------------------------------------------------------  RADIOLOGY:  Ct Head Wo Contrast  07/19/2015  CLINICAL DATA:  Acute onset of fever, generalized headache and back pain. Initial encounter. EXAM: CT HEAD WITHOUT CONTRAST TECHNIQUE: Contiguous axial images were obtained from the base of the skull through the vertex without intravenous contrast. COMPARISON:  None. FINDINGS: There is no evidence of acute infarction, mass lesion, or intra- or extra-axial hemorrhage on CT. The posterior fossa, including the cerebellum, brainstem and fourth ventricle, is within normal limits. The third and lateral ventricles, and basal ganglia are unremarkable in appearance. The cerebral hemispheres are symmetric in appearance, with normal gray-white differentiation. No mass effect or midline shift is seen. There is no  evidence of fracture; visualized osseous structures  are unremarkable in appearance. The orbits are within normal limits. The paranasal sinuses and mastoid air cells are well-aerated. No significant soft tissue abnormalities are seen. IMPRESSION: Unremarkable noncontrast CT of the head. Electronically Signed   By: Roanna Raider M.D.   On: 07/19/2015 22:27   Dg Chest Port 1 View  07/19/2015  CLINICAL DATA:  Shortness of breath and cough 2 days.  Back pain. EXAM: PORTABLE CHEST 1 VIEW COMPARISON:  08/01/2013 FINDINGS: Sternotomy wires unchanged. Exam demonstrates hypoinflated lungs with minimal hazy prominence of the perihilar markings suggesting mild vascular congestion. Mild stable cardiomegaly. Remainder of the exam is unchanged. IMPRESSION: Hypoinflation with mild stable cardiomegaly and suggestion of mild vascular congestion. Electronically Signed   By: Elberta Fortis M.D.   On: 07/19/2015 20:53     ASSESSMENT AND PLAN:   Active Problems:   Sepsis (HCC)  1.sepsis;of unknown origin;likley  Due to acute bronchitis;;clinically improving;on  Iv Rocephin;so far cultures are negative. Continue IV hydration 2.head ache due to cough; 3.NSTEMI:elevated troponin; seen by cardiology for possible Lexiscan   #4 hypertension: Controlled. Hyperlipidemia continue statins. D/w daughter    All the records are reviewed and case discussed with Care Management/Social Workerr. Management plans discussed with the patient, family and they are in agreement.  CODE STATUS: full  TOTAL TIME TAKING CARE OF THIS PATIENT:35 minutes.   POSSIBLE D/C IN 1-2DAYS, DEPENDING ON CLINICAL CONDITION.   Katha Hamming M.D on 07/20/2015 at 12:38 PM  Between 7am to 6pm - Pager - [phone]  After 6pm go to [email or web address] - password [email or web address] [facility]  [facility]  Office  [phone]  CC: Primary care physician; Marisue Ivan, MD

## 2015-07-20 NOTE — Consult Note (Signed)
[facility] Infectious Disease     Reason for Consult:Sepsis    Referring Physician: Vianne Bulls Date of Admission:  07/19/2015   Active Problems:   Sepsis [facility])   HPI: James Yates is a 60 y.o. male with a history of hypertension diabetes coronary artery disease admitted with headaches and fevers.  He also had nausea and diarrhea on admission. He was found to have a fever to 101.6, a white blood count of 7.7 tachycardia.  He has also been found to have positive troponins and an elevated lactate of 3.0.  Chest x-ray and CT of the head are negative, LFTs are normal.  Flu testing and urinalysis are negative. He had apparently returned from a trip to [place] on February 16.  He reports the headache is behind his eyes bilaterally as well as sudden onset of fevers and diaphoresis.  He also had a sore throat  Past Medical History  Diagnosis Date  . Medical history non-contributory   . Glucose intolerance (impaired glucose tolerance)   . MI (myocardial infarction) ([place])   . Obesity   . Bronchitis   . Coronary artery disease     Non-ST elevation myocardial infarction cardiac catheterization showed significant three-vessel coronary artery disease. 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 Ejection fraction was 60%.  . Hyperlipidemia   . Hypertension   . Aortic valve disease     bicuspid aortic valve with mild stenosis   Past Surgical History  Procedure Laterality Date  . No past surgeries    . Coronary artery bypass graft N/A 07/05/2013    Procedure: CORONARY ARTERY BYPASS GRAFTING (CABG);  Surgeon: Ivin Poot, MD;  Location: [place];  Service: Open Heart Surgery;  Laterality: N/A;  . Intraoperative transesophageal echocardiogram N/A 07/05/2013    Procedure: INTRAOPERATIVE TRANSESOPHAGEAL ECHOCARDIOGRAM;  Surgeon: Ivin Poot, MD;  Location: [place];  Service: Open Heart Surgery;  Laterality: N/A;  . Cardiac catheterization   06/2013    [facility]   Social History  Substance Use Topics  . Smoking status: Never Smoker   . Smokeless tobacco: None  . Alcohol Use: No   Family History  Problem Relation Age of Onset  . Heart disease Father   . Heart attack Father     Allergies: No Known Allergies  Current antibiotics: Antibiotics Given (last 72 hours)    Date/Time Action Medication Dose Rate   07/20/15 1304 Given   cefTRIAXone (ROCEPHIN) 2 g in dextrose 5 % 50 mL IVPB 2 g 100 mL/hr      MEDICATIONS: . aspirin EC  325 mg Oral Daily  . atorvastatin  40 mg Oral q1800  . cefTRIAXone (ROCEPHIN)  IV  2 g Intravenous Q24H  . enoxaparin (LOVENOX) injection  40 mg Subcutaneous Q24H  . insulin aspart  0-5 Units Subcutaneous QHS  . insulin aspart  0-9 Units Subcutaneous TID WC  . metoprolol  100 mg Oral BID  . sodium chloride  1,000 mL Intravenous Once  . sodium chloride flush  3 mL Intravenous Q12H    Review of Systems - 11 systems reviewed and negative per HPI   OBJECTIVE: Temp:  [98.3 F (36.8 C)-101.6 F (38.7 C)] 98.7 F (37.1 C) (02/20 0800) Pulse Rate:  [79-136] 79 (02/20 0800) Resp:  [15-27] 17 (02/20 0800) BP: (108-163)/(70-110) 124/87 mmHg (02/20 0800) SpO2:  [94 %-99 %] 94 % (02/20 0800) Weight:  [87.998 kg (  194 lb)] 87.998 kg (194 lb) (02/19 2001) Physical Exam  Constitutional: He is oriented to person, place, and time. He appears well-developed and well-nourished. No distress.  HENT: PERRLA< eomi, anicteric Mouth/Throat: Oropharynx is clear and moist. No oropharyngeal exudate.  Neck supple Cardiovascular: Normal rate, regular rhythm and normal heart sounds.2/6 sm Pulmonary/Chest: Effort normal and breath sounds normal. No respiratory distress. He has no wheezes.  Abdominal: Soft. Bowel sounds are normal. He exhibits no distension. There is no tenderness.  Lymphadenopathy: He has no cervical adenopathy.  Neurological: He is alert and oriented to person, place, and time.  Skin: Skin is warm  and dry. No rash noted. No erythema.  Psychiatric: He has a normal mood and affect. His behavior is normal.    LABS: Results for orders placed or performed during the hospital encounter of 07/19/15 (from the past 48 hour(s))  Comprehensive metabolic panel     Status: Abnormal   Collection Time: 07/19/15  8:19 PM  Result Value Ref Range   Sodium 139 135 - 145 mmol/L   Potassium 3.1 (L) 3.5 - 5.1 mmol/L   Chloride 102 101 - 111 mmol/L   CO2 26 22 - 32 mmol/L   Glucose, Bld 142 (H) 65 - 99 mg/dL   BUN 13 6 - 20 mg/dL   Creatinine, Ser 0.91 0.61 - 1.24 mg/dL   Calcium 8.6 (L) 8.9 - 10.3 mg/dL   Total Protein 7.8 6.5 - 8.1 g/dL   Albumin 4.4 3.5 - 5.0 g/dL   AST 26 15 - 41 U/L   ALT 26 17 - 63 U/L   Alkaline Phosphatase 93 38 - 126 U/L   Total Bilirubin 1.2 0.3 - 1.2 mg/dL   GFR calc non Af Amer >60 >60 mL/min   GFR calc Af Amer >60 >60 mL/min    Comment: (NOTE) The eGFR has been calculated using the CKD EPI equation. This calculation has not been validated in all clinical situations. eGFR's persistently <60 mL/min signify possible Chronic Kidney Disease.    Anion gap 11 5 - 15  CBC WITH DIFFERENTIAL     Status: Abnormal   Collection Time: 07/19/15  8:19 PM  Result Value Ref Range   WBC 7.7 3.8 - 10.6 K/uL   RBC 5.44 4.40 - 5.90 MIL/uL   Hemoglobin 15.8 13.0 - 18.0 g/dL   HCT 47.3 40.0 - 52.0 %   MCV 86.9 80.0 - 100.0 fL   MCH 29.0 26.0 - 34.0 pg   MCHC 33.3 32.0 - 36.0 g/dL   RDW 13.6 11.5 - 14.5 %   Platelets 150 150 - 440 K/uL   Neutrophils Relative % 88 %   Neutro Abs 6.7 (H) 1.4 - 6.5 K/uL   Lymphocytes Relative 5 %   Lymphs Abs 0.4 (L) 1.0 - 3.6 K/uL   Monocytes Relative 7 %   Monocytes Absolute 0.5 0.2 - 1.0 K/uL   Eosinophils Relative 0 %   Eosinophils Absolute 0.0 0 - 0.7 K/uL   Basophils Relative 0 %   Basophils Absolute 0.0 0 - 0.1 K/uL  Blood Culture (routine x 2)     Status: None (Preliminary result)   Collection Time: 07/19/15  8:19 PM  Result Value  Ref Range   Specimen Description BLOOD RIGHT ASSIST CONTROL    Special Requests BOTTLES DRAWN AEROBIC AND ANAEROBIC 2CCAERO,2CCANA    Culture NO GROWTH < 12 HOURS    Report Status PENDING   Troponin I     Status: Abnormal  Collection Time: 07/19/15  8:19 PM  Result Value Ref Range   Troponin I 0.19 (H) <0.031 ng/mL    Comment: READ BACK AND VERIFIED WITH TERRY BROGAN @ 2106 ON 07/19/2015 BY CAF        PERSISTENTLY INCREASED TROPONIN VALUES IN THE RANGE OF 0.04-0.49 ng/mL CAN BE SEEN IN:       -UNSTABLE ANGINA       -CONGESTIVE HEART FAILURE       -MYOCARDITIS       -CHEST TRAUMA       -ARRYHTHMIAS       -LATE PRESENTING MYOCARDIAL INFARCTION       -COPD   CLINICAL FOLLOW-UP RECOMMENDED.   Lactic acid, plasma     Status: None   Collection Time: 07/19/15  8:19 PM  Result Value Ref Range   Lactic Acid, Venous 2.0 0.5 - 2.0 mmol/L  Blood Culture (routine x 2)     Status: None (Preliminary result)   Collection Time: 07/19/15  8:21 PM  Result Value Ref Range   Specimen Description BLOOD LEFT HAND    Special Requests BOTTLES DRAWN AEROBIC AND ANAEROBIC 2CCAERO,2CCANA    Culture NO GROWTH < 12 HOURS    Report Status PENDING   Rapid Influenza A&B Antigens ([place] only)     Status: None   Collection Time: 07/19/15  8:56 PM  Result Value Ref Range   Influenza A ([facility]) NOT DETECTED    Influenza B ([facility]) NOT DETECTED   Urine culture     Status: None (Preliminary result)   Collection Time: 07/19/15 10:18 PM  Result Value Ref Range   Specimen Description URINE, RANDOM    Special Requests NONE    Culture NO GROWTH < 12 HOURS    Report Status PENDING   Urinalysis complete, with microscopic ([facility] only)     Status: Abnormal   Collection Time: 07/19/15 10:18 PM  Result Value Ref Range   Color, Urine YELLOW (A) YELLOW   APPearance CLEAR (A) CLEAR   Glucose, UA NEGATIVE NEGATIVE mg/dL   Bilirubin Urine NEGATIVE NEGATIVE   Ketones, ur NEGATIVE NEGATIVE mg/dL   Specific Gravity, Urine  1.012 1.005 - 1.030   Hgb urine dipstick NEGATIVE NEGATIVE   pH 6.0 5.0 - 8.0   Protein, ur NEGATIVE NEGATIVE mg/dL   Nitrite NEGATIVE NEGATIVE   Leukocytes, UA NEGATIVE NEGATIVE   RBC / HPF 0-5 0 - 5 RBC/hpf   WBC, UA 0-5 0 - 5 WBC/hpf   Bacteria, UA NONE SEEN NONE SEEN   Squamous Epithelial / LPF NONE SEEN NONE SEEN   Mucous PRESENT   Lactic acid, plasma     Status: Abnormal   Collection Time: 07/20/15  1:13 AM  Result Value Ref Range   Lactic Acid, Venous 3.0 (HH) 0.5 - 2.0 mmol/L    Comment: CRITICAL RESULT CALLED TO, READ BACK BY AND VERIFIED WITH TERRI GROGAN AT 0156 ON 07/20/15 BY VAB   Glucose, capillary     Status: Abnormal   Collection Time: 07/20/15  2:16 AM  Result Value Ref Range   Glucose-Capillary 219 (H) 65 - 99 mg/dL  MRSA PCR Screening     Status: None   Collection Time: 07/20/15  2:21 AM  Result Value Ref Range   MRSA by PCR NEGATIVE NEGATIVE    Comment:        The GeneXpert MRSA Assay (FDA approved for NASAL specimens only), is one component of a comprehensive MRSA colonization surveillance program. It is not  intended to diagnose MRSA infection nor to guide or monitor treatment for MRSA infections.   Troponin I     Status: Abnormal   Collection Time: 07/20/15  3:30 AM  Result Value Ref Range   Troponin I 0.22 (H) <0.031 ng/mL    Comment: PREVIOUS RESULT CALLED TERRY BROGAN AT 2106 ON 07/19/15 BY CAF.VAB        PERSISTENTLY INCREASED TROPONIN VALUES IN THE RANGE OF 0.04-0.49 ng/mL CAN BE SEEN IN:       -UNSTABLE ANGINA       -CONGESTIVE HEART FAILURE       -MYOCARDITIS       -CHEST TRAUMA       -ARRYHTHMIAS       -LATE PRESENTING MYOCARDIAL INFARCTION       -COPD   CLINICAL FOLLOW-UP RECOMMENDED.   Glucose, capillary     Status: Abnormal   Collection Time: 07/20/15  7:10 AM  Result Value Ref Range   Glucose-Capillary 156 (H) 65 - 99 mg/dL  Troponin I     Status: Abnormal   Collection Time: 07/20/15  9:56 AM  Result Value Ref Range    Troponin I 0.28 (H) <0.031 ng/mL    Comment: PREVIOUS RESULT CALLED TO TERRY BROGDEN ON 07/19/15 AT 2106 BY CAF/QSD        PERSISTENTLY INCREASED TROPONIN VALUES IN THE RANGE OF 0.04-0.49 ng/mL CAN BE SEEN IN:       -UNSTABLE ANGINA       -CONGESTIVE HEART FAILURE       -MYOCARDITIS       -CHEST TRAUMA       -ARRYHTHMIAS       -LATE PRESENTING MYOCARDIAL INFARCTION       -COPD   CLINICAL FOLLOW-UP RECOMMENDED.   Glucose, capillary     Status: Abnormal   Collection Time: 07/20/15 11:55 AM  Result Value Ref Range   Glucose-Capillary 159 (H) 65 - 99 mg/dL   No components found for: ESR, C REACTIVE PROTEIN MICRO: Recent Results (from the past 720 hour(s))  Blood Culture (routine x 2)     Status: None (Preliminary result)   Collection Time: 07/19/15  8:19 PM  Result Value Ref Range Status   Specimen Description BLOOD RIGHT ASSIST CONTROL  Final   Special Requests BOTTLES DRAWN AEROBIC AND ANAEROBIC 2CCAERO,2CCANA  Final   Culture NO GROWTH < 12 HOURS  Final   Report Status PENDING  Incomplete  Blood Culture (routine x 2)     Status: None (Preliminary result)   Collection Time: 07/19/15  8:21 PM  Result Value Ref Range Status   Specimen Description BLOOD LEFT HAND  Final   Special Requests BOTTLES DRAWN AEROBIC AND ANAEROBIC 2CCAERO,2CCANA  Final   Culture NO GROWTH < 12 HOURS  Final   Report Status PENDING  Incomplete  Rapid Influenza A&B Antigens ([place] only)     Status: None   Collection Time: 07/19/15  8:56 PM  Result Value Ref Range Status   Influenza A ([place]) NOT DETECTED  Final   Influenza B ([facility]) NOT DETECTED  Final  Urine culture     Status: None (Preliminary result)   Collection Time: 07/19/15 10:18 PM  Result Value Ref Range Status   Specimen Description URINE, RANDOM  Final   Special Requests NONE  Final   Culture NO GROWTH < 12 HOURS  Final   Report Status PENDING  Incomplete  MRSA PCR Screening     Status: None   Collection Time:  07/20/15  2:21 AM  Result  Value Ref Range Status   MRSA by PCR NEGATIVE NEGATIVE Final    Comment:        The GeneXpert MRSA Assay (FDA approved for NASAL specimens only), is one component of a comprehensive MRSA colonization surveillance program. It is not intended to diagnose MRSA infection nor to guide or monitor treatment for MRSA infections.    IMAGING: Ct Head Wo Contrast  07/19/2015  CLINICAL DATA:  Acute onset of fever, generalized headache and back pain. Initial encounter. EXAM: CT HEAD WITHOUT CONTRAST TECHNIQUE: Contiguous axial images were obtained from the base of the skull through the vertex without intravenous contrast. COMPARISON:  None. FINDINGS: There is no evidence of acute infarction, mass lesion, or intra- or extra-axial hemorrhage on CT. The posterior fossa, including the cerebellum, brainstem and fourth ventricle, is within normal limits. The third and lateral ventricles, and basal ganglia are unremarkable in appearance. The cerebral hemispheres are symmetric in appearance, with normal gray-white differentiation. No mass effect or midline shift is seen. There is no evidence of fracture; visualized osseous structures are unremarkable in appearance. The orbits are within normal limits. The paranasal sinuses and mastoid air cells are well-aerated. No significant soft tissue abnormalities are seen. IMPRESSION: Unremarkable noncontrast CT of the head. Electronically Signed   By: Garald Balding M.D.   On: 07/19/2015 22:27   Dg Chest Port 1 View  07/19/2015  CLINICAL DATA:  Shortness of breath and cough 2 days.  Back pain. EXAM: PORTABLE CHEST 1 VIEW COMPARISON:  08/01/2013 FINDINGS: Sternotomy wires unchanged. Exam demonstrates hypoinflated lungs with minimal hazy prominence of the perihilar markings suggesting mild vascular congestion. Mild stable cardiomegaly. Remainder of the exam is unchanged. IMPRESSION: Hypoinflation with mild stable cardiomegaly and suggestion of mild vascular congestion.  Electronically Signed   By: Marin Olp M.D.   On: 07/19/2015 20:53    Assessment:   James Yates is a 60 y.o. male with fever, HA  and cough a few days after returning from a week trip to [place]. He is feeling better currently, wbc nml, fever improving. Likley viral URI. Per CDC website no risk of malaria in [place] but there is dengue and zika.   Recommendations Check dengue, zika Check strep Stop abx and monitor  Thank you very much for allowing me to participate in the care of this patient. Please call with questions.   Cheral Marker. Ola Spurr, MD

## 2015-07-21 LAB — URINE CULTURE: Culture: 9000

## 2015-07-21 LAB — MISC LABCORP TEST (SEND OUT)

## 2015-07-21 LAB — GLUCOSE, CAPILLARY
GLUCOSE-CAPILLARY: 149 mg/dL — AB (ref 65–99)
GLUCOSE-CAPILLARY: 109 mg/dL — AB (ref 65–99)
Glucose-Capillary: 96 mg/dL (ref 65–99)
Glucose-Capillary: 107 mg/dL — ABNORMAL HIGH (ref 65–99)

## 2015-07-21 MED ORDER — HYDROCOD POLST-CPM POLST ER 10-8 MG/5ML PO SUER
5.0000 mL | Freq: Two times a day (BID) | ORAL | Status: DC | PRN
  Administered 2015-07-21 – 2015-07-22 (×2): 5 mL via ORAL
  Filled 2015-07-21 (×2): qty 5

## 2015-07-21 MED ORDER — ALBUTEROL SULFATE (2.5 MG/3ML) 0.083% IN NEBU
2.5000 mg | INHALATION_SOLUTION | Freq: Four times a day (QID) | RESPIRATORY_TRACT | Status: DC
  Administered 2015-07-21 – 2015-07-22 (×2): 2.5 mg via RESPIRATORY_TRACT
  Filled 2015-07-21 (×2): qty 3

## 2015-07-21 MED ORDER — ALBUTEROL SULFATE HFA 108 (90 BASE) MCG/ACT IN AERS: 2.0000 | INHALATION_SPRAY | Freq: Four times a day (QID) | RESPIRATORY_TRACT | Status: DC

## 2015-07-21 MED ORDER — ALBUTEROL SULFATE (2.5 MG/3ML) 0.083% IN NEBU
2.5000 mg | INHALATION_SOLUTION | Freq: Four times a day (QID) | RESPIRATORY_TRACT | Status: DC
  Administered 2015-07-21: 2.5 mg via RESPIRATORY_TRACT
  Filled 2015-07-21: qty 3

## 2015-07-21 NOTE — Progress Notes (Signed)
[facility] CLINIC INFECTIOUS DISEASE PROGRESS NOTE Date of Admission:  07/19/2015     ID: James Yates is a 60 y.o. male with fevers, cough   Active Problems:   Sepsis (HCC)   Elevated troponin   Subjective: No fevers, but reports feeling worse. Some dizziness, breaking out in sweat when standing  ROS  Eleven systems are reviewed and negative except per hpi  Medications:  Antibiotics Given (last 72 hours)    Date/Time Action Medication Dose Rate   07/20/15 1304 Given   cefTRIAXone (ROCEPHIN) 2 g in dextrose 5 % 50 mL IVPB 2 g 100 mL/hr     . albuterol  2.5 mg Nebulization QID  . aspirin EC  325 mg Oral Daily  . atorvastatin  40 mg Oral q1800  . enoxaparin (LOVENOX) injection  40 mg Subcutaneous Q24H  . insulin aspart  0-5 Units Subcutaneous QHS  . insulin aspart  0-9 Units Subcutaneous TID WC  . metoprolol  100 mg Oral BID  . sodium chloride  1,000 mL Intravenous Once  . sodium chloride flush  3 mL Intravenous Q12H    Objective: Vital signs in last 24 hours: Temp:  [98 F (36.7 C)-98.9 F (37.2 C)] 98.9 F (37.2 C) (02/21 1410) Pulse Rate:  [61-101] 76 (02/21 1410) Resp:  [16-28] 20 (02/21 1410) BP: (115-144)/(76-97) 115/76 mmHg (02/21 1410) SpO2:  [93 %-99 %] 95 % (02/21 1410) Constitutional: He is oriented to person, place, and time. He appears well-developed and well-nourished. No distress.  HENT: PERRL,  eomi, anicteric Mouth/Throat: Oropharynx is clear and moist. No oropharyngeal exudate.  Neck supple Cardiovascular: Normal rate, regular rhythm and normal heart sounds.2/6 sm Pulmonary/Chest: Effort normal and breath sounds normal. No respiratory distress. He has no wheezes.  Abdominal: Soft. Bowel sounds are normal. He exhibits no distension. There is no tenderness.  Lymphadenopathy: He has no cervical adenopathy.  Neurological: He is alert and oriented to person, place, and time.  Skin: Skin is warm and dry. No rash noted. No erythema.  Psychiatric: He  has a normal mood and affect. His behavior is normal.   Lab Results  Recent Labs  07/19/15 2019 07/20/15 2103  WBC 7.7  --   HGB 15.8  --   HCT 47.3  --   NA 139 139  K 3.1* 3.5  CL 102 107  CO2 26 27  BUN 13 16  CREATININE 0.91 1.00    Microbiology: Results for orders placed or performed during the hospital encounter of 07/19/15  Blood Culture (routine x 2)     Status: None (Preliminary result)   Collection Time: 07/19/15  8:19 PM  Result Value Ref Range Status   Specimen Description BLOOD RIGHT ASSIST CONTROL  Final   Special Requests BOTTLES DRAWN AEROBIC AND ANAEROBIC 2CCAERO,2CCANA  Final   Culture NO GROWTH 2 DAYS  Final   Report Status PENDING  Incomplete  Blood Culture (routine x 2)     Status: None (Preliminary result)   Collection Time: 07/19/15  8:21 PM  Result Value Ref Range Status   Specimen Description BLOOD LEFT HAND  Final   Special Requests BOTTLES DRAWN AEROBIC AND ANAEROBIC 2CCAERO,2CCANA  Final   Culture NO GROWTH 2 DAYS  Final   Report Status PENDING  Incomplete  Rapid Influenza A&B Antigens ([facility] only)     Status: None   Collection Time: 07/19/15  8:56 PM  Result Value Ref Range Status   Influenza A [facility]) NOT DETECTED  Final   Influenza  B [facility]) NOT DETECTED  Final  Urine culture     Status: None   Collection Time: 07/19/15 10:18 PM  Result Value Ref Range Status   Specimen Description URINE, RANDOM  Final   Special Requests NONE  Final   Culture 9,000 COLONIES/mL INSIGNIFICANT GROWTH  Final   Report Status 07/21/2015 FINAL  Final  MRSA PCR Screening     Status: None   Collection Time: 07/20/15  2:21 AM  Result Value Ref Range Status   MRSA by PCR NEGATIVE NEGATIVE Final    Comment:        The GeneXpert MRSA Assay (FDA approved for NASAL specimens only), is one component of a comprehensive MRSA colonization surveillance program. It is not intended to diagnose MRSA infection nor to guide or monitor treatment for MRSA infections.      Studies/Results: Ct Head Wo Contrast  07/19/2015  CLINICAL DATA:  Acute onset of fever, generalized headache and back pain. Initial encounter. EXAM: CT HEAD WITHOUT CONTRAST TECHNIQUE: Contiguous axial images were obtained from the base of the skull through the vertex without intravenous contrast. COMPARISON:  None. FINDINGS: There is no evidence of acute infarction, mass lesion, or intra- or extra-axial hemorrhage on CT. The posterior fossa, including the cerebellum, brainstem and fourth ventricle, is within normal limits. The third and lateral ventricles, and basal ganglia are unremarkable in appearance. The cerebral hemispheres are symmetric in appearance, with normal gray-white differentiation. No mass effect or midline shift is seen. There is no evidence of fracture; visualized osseous structures are unremarkable in appearance. The orbits are within normal limits. The paranasal sinuses and mastoid air cells are well-aerated. No significant soft tissue abnormalities are seen. IMPRESSION: Unremarkable noncontrast CT of the head. Electronically Signed   By: Roanna Raider M.D.   On: 07/19/2015 22:27   Dg Chest Port 1 View  07/19/2015  CLINICAL DATA:  Shortness of breath and cough 2 days.  Back pain. EXAM: PORTABLE CHEST 1 VIEW COMPARISON:  08/01/2013 FINDINGS: Sternotomy wires unchanged. Exam demonstrates hypoinflated lungs with minimal hazy prominence of the perihilar markings suggesting mild vascular congestion. Mild stable cardiomegaly. Remainder of the exam is unchanged. IMPRESSION: Hypoinflation with mild stable cardiomegaly and suggestion of mild vascular congestion. Electronically Signed   By: Elberta Fortis M.D.   On: 07/19/2015 20:53    Assessment/Plan: James Yates is a 60 y.o. male with fever, HA and cough a few days after returning from a week trip to [place]. He is feeling better currently, wbc nml, fever improving. Likley viral URI. Per CDC website no risk of malaria in [place] but  there is dengue and zika.   Recommendations  Echo pending Pending dengue, zika Pending strep Continue to monitor off abx Check cbc in AM  Thank you very much for the consult. Will follow with you.  Safia Panzer   07/21/2015, 4:00 PM

## 2015-07-21 NOTE — Progress Notes (Signed)
[facility] - [place] at [facility]   PATIENT NAME: James Yates    MR#:  [id number]  DATE OF BIRTH:  September 15, 1955  SUBJECTIVE: Patient is seen at bedside does a lot of dry cough. No sore throat no body pains. No chest pain.   CHIEF COMPLAINT:   Chief Complaint  Patient presents with  . Code Sepsis    REVIEW OF SYSTEMS:    Review of Systems  Constitutional: Negative for fever.  HENT: Negative for hearing loss.   Eyes: Negative for blurred vision, double vision and photophobia.  Respiratory: Positive for cough. Negative for hemoptysis and shortness of breath.   Cardiovascular: Negative for palpitations, orthopnea and leg swelling.  Gastrointestinal: Negative for vomiting, abdominal pain and diarrhea.  Genitourinary: Negative for dysuria and urgency.  Musculoskeletal: Negative for myalgias and neck pain.  Skin: Negative for rash.  Neurological: Negative for dizziness, focal weakness, seizures, weakness and headaches.  Psychiatric/Behavioral: Negative for memory loss. The patient does not have insomnia.     Nutrition:  Tolerating Diet: Tolerating PT:      DRUG ALLERGIES:  No Known Allergies  VITALS:  Blood pressure 140/85, pulse 86, temperature 98.8 F (37.1 C), temperature source Oral, resp. rate 20, height  (1.651 m), weight 87.998 kg (194 lb), SpO2 96 %.  PHYSICAL EXAMINATION:   Physical Exam  GENERAL:  60 y.o.-year-old patient lying in the bed with no acute distress.  EYES: Pupils equal, round, reactive to light and accommodation. No scleral icterus. Extraocular muscles intact.  HEENT: Head atraumatic, normocephalic. Oropharynx and nasopharynx clear.  NECK:  Supple, no jugular venous distention. No thyroid enlargement, no tenderness.  LUNGS: Normal breath sounds bilaterally, no wheezing, rales,rhonchi or crepitation. No use of accessory muscles of respiration.  CARDIOVASCULAR: S1, S2 normal. No murmurs, rubs, or gallops.  ABDOMEN:  Soft, nontender, nondistended. Bowel sounds present. No organomegaly or mass.  EXTREMITIES: No pedal edema, cyanosis, or clubbing.  NEUROLOGIC: Cranial nerves II through XII are intact. Muscle strength 5/5 in all extremities. Sensation intact. Gait not checked.  PSYCHIATRIC: The patient is alert and oriented x 3.  SKIN: No obvious rash, lesion, or ulcer.    LABORATORY PANEL:   CBC  Recent Labs Lab 07/19/15 2019  WBC 7.7  HGB 15.8  HCT 47.3  PLT 150   ------------------------------------------------------------------------------------------------------------------  Chemistries   Recent Labs Lab 07/19/15 2019 07/20/15 2103  NA 139 139  K 3.1* 3.5  CL 102 107  CO2 26 27  GLUCOSE 142* 120*  BUN 13 16  CREATININE 0.91 1.00  CALCIUM 8.6* 8.4*  AST 26  --   ALT 26  --   ALKPHOS 93  --   BILITOT 1.2  --    ------------------------------------------------------------------------------------------------------------------  Cardiac Enzymes  Recent Labs Lab 07/20/15 0956  TROPONINI 0.28*   ------------------------------------------------------------------------------------------------------------------  RADIOLOGY:  Ct Head Wo Contrast  07/19/2015  CLINICAL DATA:  Acute onset of fever, generalized headache and back pain. Initial encounter. EXAM: CT HEAD WITHOUT CONTRAST TECHNIQUE: Contiguous axial images were obtained from the base of the skull through the vertex without intravenous contrast. COMPARISON:  None. FINDINGS: There is no evidence of acute infarction, mass lesion, or intra- or extra-axial hemorrhage on CT. The posterior fossa, including the cerebellum, brainstem and fourth ventricle, is within normal limits. The third and lateral ventricles, and basal ganglia are unremarkable in appearance. The cerebral hemispheres are symmetric in appearance, with normal gray-white differentiation. No mass effect or midline shift is seen. There is  no evidence of fracture; visualized  osseous structures are unremarkable in appearance. The orbits are within normal limits. The paranasal sinuses and mastoid air cells are well-aerated. No significant soft tissue abnormalities are seen. IMPRESSION: Unremarkable noncontrast CT of the head. Electronically Signed   By: Roanna Raider M.D.   On: 07/19/2015 22:27   Dg Chest Port 1 View  07/19/2015  CLINICAL DATA:  Shortness of breath and cough 2 days.  Back pain. EXAM: PORTABLE CHEST 1 VIEW COMPARISON:  08/01/2013 FINDINGS: Sternotomy wires unchanged. Exam demonstrates hypoinflated lungs with minimal hazy prominence of the perihilar markings suggesting mild vascular congestion. Mild stable cardiomegaly. Remainder of the exam is unchanged. IMPRESSION: Hypoinflation with mild stable cardiomegaly and suggestion of mild vascular congestion. Electronically Signed   By: Elberta Fortis M.D.   On: 07/19/2015 20:53     ASSESSMENT AND PLAN:   Active Problems:   Sepsis (HCC)   Elevated troponin  1.sepsis;of unknown origin;likley  Due to acute bronchitis;;clinically improving; likely viral in origin. Antibiotics are stopped by the ID physician. So far culture data is negative. Because of recent history of travel to [place] , CDC was  contacted and they recommended to test for ZIKA virus.. Labs were already sent. 2.head ache due to cough; improved. 3.NSTEMI:elevated troponin; seen by cardiology . Echo is done and results are pending.  #4 hypertension: Controlled. Hyperlipidemia continue statins. D/w daughter #5 due to cough start him on albuterol, Tussionex.  Likely discharge tomorrow. All the records are reviewed and case discussed with Care Management/Social Workerr. Management plans discussed with the patient, family and they are in agreement.  CODE STATUS: full  TOTAL TIME TAKING CARE OF THIS PATIENT:35 minutes.   POSSIBLE D/C IN 1-2DAYS, DEPENDING ON CLINICAL CONDITION.   Katha Hamming M.D on 07/21/2015 at 10:27 AM  Between 7am  to 6pm - Pager - [phone]  After 6pm go to [email or web address] - password [email or web address] [facility]  [place] [facility]  Office  [phone]  CC: Primary care physician; Marisue Ivan, MD

## 2015-07-22 LAB — CBC
HCT: 45.3 % (ref 40.0–52.0)
HEMOGLOBIN: 15.1 g/dL (ref 13.0–18.0)
MCH: 28.9 pg (ref 26.0–34.0)
MCHC: 33.5 g/dL (ref 32.0–36.0)
MCV: 86.4 fL (ref 80.0–100.0)
PLATELETS: 150 10*3/uL (ref 150–440)
RBC: 5.24 MIL/uL (ref 4.40–5.90)
RDW: 13.9 % (ref 11.5–14.5)
WBC: 5.4 10*3/uL (ref 3.8–10.6)

## 2015-07-22 LAB — GLUCOSE, CAPILLARY
GLUCOSE-CAPILLARY: 92 mg/dL (ref 65–99)
GLUCOSE-CAPILLARY: 146 mg/dL — AB (ref 65–99)

## 2015-07-22 MED ORDER — ALBUTEROL SULFATE HFA 108 (90 BASE) MCG/ACT IN AERS: 2.0000 | INHALATION_SPRAY | Freq: Four times a day (QID) | RESPIRATORY_TRACT | Status: DC | PRN

## 2015-07-22 NOTE — Care Management Note (Signed)
Case Management Note  Patient Details  Name: James Yates MRN: [id number] Date of Birth: 06/15/1955  Subjective/Objective:   Spoke with patient who ask that I speak with is family . TC to son, Arif Amendola.  He reports patient has insurance but he is not sure through what company (possibly [employer]). He denies issues obtaining discharge medication or need for any assistance. Per son patient is able to purchase medication (albuterol inhaler) and his insurance only charges him $5 usually. No additional needs identified.            Action/Plan: HOme with self care. Case closed.  Expected Discharge Date:   07/22/2015               Expected Discharge Plan:  Home/Self Care  In-House Referral:     Discharge planning Services  Medication Assistance, CM Consult  Post Acute Care Choice:    Choice offered to:     DME Arranged:    DME Agency:     HH Arranged:    HH Agency:     Status of Service:  Completed, signed off  Medicare Important Message Given:    Date Medicare IM Given:    Medicare IM give by:    Date Additional Medicare IM Given:    Additional Medicare Important Message give by:     If discussed at Long Length of Stay Meetings, dates discussed:    Additional Comments:  Marily Memos, RN 07/22/2015, 10:09 AM

## 2015-07-22 NOTE — Progress Notes (Signed)
Cbc results called to MD as requested/ strep test results still pending

## 2015-07-22 NOTE — Progress Notes (Signed)
  The patient's echocardiogram showed a normal EF of 60-65% with no wall motion abnormalities. Would reschedule his outpatient stress test once he recovers from this acute illness. No further inpatient Cardiology workup indicated at this time.   Signed, Ellsworth Lennox, PA-C 07/22/2015, 7:45 AM Pager: [phone]

## 2015-07-22 NOTE — Progress Notes (Signed)
[facility] - [place] at [facility]   PATIENT NAME: James Yates    MR#:  [id number]  DATE OF BIRTH:  01-Feb-1956  SUBJECTIVE: for discharge today.ZYka test is negative,  CHIEF COMPLAINT:   Chief Complaint  Patient presents with  . Code Sepsis    REVIEW OF SYSTEMS:    Review of Systems  Constitutional: Negative for fever.  HENT: Negative for hearing loss.   Eyes: Negative for blurred vision, double vision and photophobia.  Respiratory: Positive for cough. Negative for hemoptysis and shortness of breath.   Cardiovascular: Negative for palpitations, orthopnea and leg swelling.  Gastrointestinal: Negative for vomiting, abdominal pain and diarrhea.  Genitourinary: Negative for dysuria and urgency.  Musculoskeletal: Negative for myalgias and neck pain.  Skin: Negative for rash.  Neurological: Negative for dizziness, focal weakness, seizures, weakness and headaches.  Psychiatric/Behavioral: Negative for memory loss. The patient does not have insomnia.     Nutrition:  Tolerating Diet: Tolerating PT:      DRUG ALLERGIES:  No Known Allergies  VITALS:  Blood pressure 129/79, pulse 64, temperature 97.7 F (36.5 C), temperature source Oral, resp. rate 16, height  (1.651 m), weight 87 kg (191 lb 12.8 oz), SpO2 95 %.  PHYSICAL EXAMINATION:   Physical Exam  GENERAL:  60 y.o.-year-old patient lying in the bed with no acute distress.  EYES: Pupils equal, round, reactive to light and accommodation. No scleral icterus. Extraocular muscles intact.  HEENT: Head atraumatic, normocephalic. Oropharynx and nasopharynx clear.  NECK:  Supple, no jugular venous distention. No thyroid enlargement, no tenderness.  LUNGS: Normal breath sounds bilaterally, no wheezing, rales,rhonchi or crepitation. No use of accessory muscles of respiration.  CARDIOVASCULAR: S1, S2 normal. No murmurs, rubs, or gallops.  ABDOMEN: Soft, nontender, nondistended. Bowel sounds  present. No organomegaly or mass.  EXTREMITIES: No pedal edema, cyanosis, or clubbing.  NEUROLOGIC: Cranial nerves II through XII are intact. Muscle strength 5/5 in all extremities. Sensation intact. Gait not checked.  PSYCHIATRIC: The patient is alert and oriented x 3.  SKIN: No obvious rash, lesion, or ulcer.    LABORATORY PANEL:   CBC  Recent Labs Lab 07/22/15 0957  WBC 5.4  HGB 15.1  HCT 45.3  PLT 150   ------------------------------------------------------------------------------------------------------------------  Chemistries   Recent Labs Lab 07/19/15 2019 07/20/15 2103  NA 139 139  K 3.1* 3.5  CL 102 107  CO2 26 27  GLUCOSE 142* 120*  BUN 13 16  CREATININE 0.91 1.00  CALCIUM 8.6* 8.4*  AST 26  --   ALT 26  --   ALKPHOS 93  --   BILITOT 1.2  --    ------------------------------------------------------------------------------------------------------------------  Cardiac Enzymes  Recent Labs Lab 07/20/15 0956  TROPONINI 0.28*   ------------------------------------------------------------------------------------------------------------------  RADIOLOGY:  No results found.   ASSESSMENT AND PLAN:   Active Problems:   Sepsis (HCC)   Elevated troponin  1.sepsis;of unknown origin;likley  Due to acute bronchitis;;clinically improving; likely viral in origin. Antibiotics are stopped by the ID physician. So far culture data is negative. Because of recent history of travel to [place] , CDC was  contacted and they recommended to test for ZIKA virus.it is negative.for d./c home today if stress test is negative. 2.head ache due to cough; improved. 3.NSTEMI:elevated troponin; seen by cardiology .  follow-up with cardiology as an outpatient.#4 hypertension: Controlled. Hyperlipidemia continue statins. D/w daughter #5 due to cough start him on albuterol, Tussionex.   discharge home today if stress test is negative. All  the records are reviewed and case  discussed with Care Management/Social Workerr. Management plans discussed with the patient, family and they are in agreement.  CODE STATUS: full  TOTAL TIME TAKING CARE OF THIS PATIENT:35 minutes.   POSSIBLE D/C IN 1-2DAYS, DEPENDING ON CLINICAL CONDITION.   Katha Hamming M.D on 07/22/2015 at 1:00 PM  Between 7am to 6pm - Pager - [phone]  After 6pm go to [email or web address] - password [email or web address] [facility]  [place] [facility]  Office  [phone]  CC: Primary care physician; Marisue Ivan, MD

## 2015-07-22 NOTE — Progress Notes (Signed)
Rapid strep neg for strep/ ok to discharge home/ discharge instructions explained to pt and pts family/ verbalized an understanding/ iv and tele removed/ will transport off unit via wheelchair.

## 2015-07-24 LAB — CULTURE, BLOOD (ROUTINE X 2)
CULTURE: NO GROWTH
Culture: NO GROWTH

## 2015-07-24 LAB — MISC LABCORP TEST (SEND OUT)

## 2015-07-25 LAB — CULTURE, GROUP A STREP (THRC)

## 2015-07-26 NOTE — Discharge Summary (Signed)
James Yates, is a 60 y.o. male  DOB Aug 29, 1955  MRN [id number].  Admission date:  07/19/2015  Admitting Physician  Milagros Loll, MD  Discharge Date:  07/22/2015   Primary MD  Marisue Ivan, MD  Recommendations for primary care physician for things to follow:   Follow up with PMD in one week  Admission Diagnosis  Tachycardia [R00.0] Elevated troponin [R79.89] Other specified fever [R50.9] Nonintractable episodic headache, unspecified headache type [R51]   Discharge Diagnosis  Tachycardia [R00.0] Elevated troponin [R79.89] Other specified fever [R50.9] Nonintractable episodic headache, unspecified headache type [R51]    Active Problems:   Sepsis (HCC)   Elevated troponin      Past Medical History  Diagnosis Date  . Medical history non-contributory   . Glucose intolerance (impaired glucose tolerance)   . MI (myocardial infarction) (HCC)   . Obesity   . Bronchitis   . Coronary artery disease     Non-ST elevation myocardial infarction cardiac catheterization showed significant three-vessel coronary artery disease. 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 Ejection fraction was 60%.  . Hyperlipidemia   . Hypertension   . Aortic valve disease     bicuspid aortic valve with mild stenosis    Past Surgical History  Procedure Laterality Date  . No past surgeries    . Coronary artery bypass graft N/A 07/05/2013    Procedure: CORONARY ARTERY BYPASS GRAFTING (CABG);  Surgeon: [name], MD;  Location: [facility] OR;  Service: Open Heart Surgery;  Laterality: N/A;  . Intraoperative transesophageal echocardiogram N/A 07/05/2013    Procedure: INTRAOPERATIVE TRANSESOPHAGEAL ECHOCARDIOGRAM;  Surgeon: [name], MD;  Location: [facility] OR;  Service: Open  Heart Surgery;  Laterality: N/A;  . Cardiac catheterization  06/2013    [facility]       History of present illness and  Hospital Course:     Kindly see H&P for history of present illness and admission details, please review complete Labs, Consult reports and Test reports for all details in brief  HPI  from the history and physical done on the day of admission  Admitted for fever/and sore throat/headache.recently travelled from [place].  Hospital Course   1.fever thought to be due to viral URI.blood culture,urine cultures are negative.intially started on abx.but abx discontinued by DR.FItzgerald after final culture data came back negative.sent out blood work for [employer] and dengue as per [employer] travel guideline.ZIKA virus is negative.rpt cbc also is normal.started some albuterol for cough .discharged home.. *2.elevated troponin  Due to demand  Ischemia rather than ACS;seen by cardio,recommend out  Pt stress test.Echo showed EF more than 55%. 3.htn;controlled;continue asa,statins and metoprolol   Discharge Condition: stable   Follow UP;   follow up with [facility] cardio in 1-2 weeks  For out pt stress test   Discharge Instructions  and  Discharge Medications        Medication List    STOP taking these medications        NON FORMULARY      TAKE these medications        albuterol 108 (90 Base) MCG/ACT inhaler  Commonly known as:  PROVENTIL HFA;VENTOLIN HFA  Inhale 2 puffs into the lungs every 6 (six) hours as needed for wheezing or shortness of breath.     aspirin 325 MG EC tablet  Take 1 tablet (325 mg total) by mouth daily.  atorvastatin 80 MG tablet  Commonly known as:  LIPITOR  TAKE 1 TABLET (80 MG TOTAL) BY MOUTH DAILY AT 6 PM.     ibuprofen 100 MG tablet  Commonly known as:  ADVIL,MOTRIN  Take 200 mg by mouth every 6 (six) hours as needed for pain.     metoprolol 100 MG tablet  Commonly known as:  LOPRESSOR  Take 1 tablet (100 mg total) by mouth 2 (two)  times daily.          Diet and Activity recommendation: See Discharge Instructions above   Consults obtained - ID   Major procedures and Radiology Reports - PLEASE review detailed and final reports for all details, in brief -      Ct Head Wo Contrast  07/19/2015  CLINICAL DATA:  Acute onset of fever, generalized headache and back pain. Initial encounter. EXAM: CT HEAD WITHOUT CONTRAST TECHNIQUE: Contiguous axial images were obtained from the base of the skull through the vertex without intravenous contrast. COMPARISON:  None. FINDINGS: There is no evidence of acute infarction, mass lesion, or intra- or extra-axial hemorrhage on CT. The posterior fossa, including the cerebellum, brainstem and fourth ventricle, is within normal limits. The third and lateral ventricles, and basal ganglia are unremarkable in appearance. The cerebral hemispheres are symmetric in appearance, with normal gray-white differentiation. No mass effect or midline shift is seen. There is no evidence of fracture; visualized osseous structures are unremarkable in appearance. The orbits are within normal limits. The paranasal sinuses and mastoid air cells are well-aerated. No significant soft tissue abnormalities are seen. IMPRESSION: Unremarkable noncontrast CT of the head. Electronically Signed   By: Roanna Raider M.D.   On: 07/19/2015 22:27   Dg Chest Port 1 View  07/19/2015  CLINICAL DATA:  Shortness of breath and cough 2 days.  Back pain. EXAM: PORTABLE CHEST 1 VIEW COMPARISON:  08/01/2013 FINDINGS: Sternotomy wires unchanged. Exam demonstrates hypoinflated lungs with minimal hazy prominence of the perihilar markings suggesting mild vascular congestion. Mild stable cardiomegaly. Remainder of the exam is unchanged. IMPRESSION: Hypoinflation with mild stable cardiomegaly and suggestion of mild vascular congestion. Electronically Signed   By: Elberta Fortis M.D.   On: 07/19/2015 20:53    Micro Results    Recent Results  (from the past 240 hour(s))  Blood Culture (routine x 2)     Status: None   Collection Time: 07/19/15  8:19 PM  Result Value Ref Range Status   Specimen Description BLOOD RIGHT ASSIST CONTROL  Final   Special Requests BOTTLES DRAWN AEROBIC AND ANAEROBIC 2CCAERO,2CCANA  Final   Culture NO GROWTH 5 DAYS  Final   Report Status 07/24/2015 FINAL  Final  Blood Culture (routine x 2)     Status: None   Collection Time: 07/19/15  8:21 PM  Result Value Ref Range Status   Specimen Description BLOOD LEFT HAND  Final   Special Requests BOTTLES DRAWN AEROBIC AND ANAEROBIC 2CCAERO,2CCANA  Final   Culture NO GROWTH 5 DAYS  Final   Report Status 07/24/2015 FINAL  Final  Rapid Influenza A&B Antigens ([facility] only)     Status: None   Collection Time: 07/19/15  8:56 PM  Result Value Ref Range Status   Influenza A ([facility]) NOT DETECTED  Final   Influenza B ([facility]) NOT DETECTED  Final  Urine culture     Status: None   Collection Time: 07/19/15 10:18 PM  Result Value Ref Range Status   Specimen Description URINE, RANDOM  Final  Special Requests NONE  Final   Culture 9,000 COLONIES/mL INSIGNIFICANT GROWTH  Final   Report Status 07/21/2015 FINAL  Final  MRSA PCR Screening     Status: None   Collection Time: 07/20/15  2:21 AM  Result Value Ref Range Status   MRSA by PCR NEGATIVE NEGATIVE Final    Comment:        The GeneXpert MRSA Assay (FDA approved for NASAL specimens only), is one component of a comprehensive MRSA colonization surveillance program. It is not intended to diagnose MRSA infection nor to guide or monitor treatment for MRSA infections.   Culture, group A strep     Status: None   Collection Time: 07/22/15  9:40 AM  Result Value Ref Range Status   Specimen Description THROAT  Final   Special Requests NONE  Final   Culture   Final    LIGHT GROWTH NON-GROUPABLE BETA STREPTOCOCCUS There is no known Penicillin Resistant Beta Streptococcus in the [place] For patients that are  Penicillin-allergic, Erythromycin is 85-94% susceptible, and Clindamycin is 80% susceptible.  Contact Microbiology within 7 days if sensitivity testing is  required.      Report Status 07/25/2015 FINAL  Final       Today   Subjective:   Nicolaus Andel today has no headache,no chest abdominal pain,no new weakness tingling or numbness, feels much better wants to go home today.   Objective:   Blood pressure 129/79, pulse 64, temperature 97.7 F (36.5 C), temperature source Oral, resp. rate 16, height 5\' 5"  (1.651 m), weight 87 kg (191 lb 12.8 oz), SpO2 95 %.  No intake or output data in the 24 hours ending 07/26/15 2227  Exam Awake Alert, Oriented x 3, No new F.N deficits, Normal affect [place].AT,PERRAL Supple Neck,No JVD, No cervical lymphadenopathy appriciated.  Symmetrical Chest wall movement, Good air movement bilaterally, CTAB RRR,No Gallops,Rubs or new Murmurs, No Parasternal Heave +ve B.Sounds, Abd Soft, Non tender, No organomegaly appriciated, No rebound -guarding or rigidity. No Cyanosis, Clubbing or edema, No new Rash or bruise  Data Review   CBC w Diff:  Lab Results  Component Value Date   WBC 5.4 07/22/2015   WBC 8.4 07/01/2013   HGB 15.1 07/22/2015   HGB 16.3 07/01/2013   HCT 45.3 07/22/2015   HCT 47.8 07/01/2013   PLT 150 07/22/2015   PLT 193 07/01/2013   LYMPHOPCT 5 07/19/2015   MONOPCT 7 07/19/2015   EOSPCT 0 07/19/2015   BASOPCT 0 07/19/2015    CMP:  Lab Results  Component Value Date   NA 139 07/20/2015   NA 134* 07/01/2013   K 3.5 07/20/2015   K 3.5 07/01/2013   CL 107 07/20/2015   CL 104 07/01/2013   CO2 27 07/20/2015   CO2 29 07/01/2013   BUN 16 07/20/2015   BUN 12 07/01/2013   CREATININE 1.00 07/20/2015   CREATININE 0.94 07/01/2013   PROT 7.8 07/19/2015   PROT 7.3 09/02/2013   ALBUMIN 4.4 07/19/2015   ALBUMIN 4.6 09/02/2013   BILITOT 1.2 07/19/2015   ALKPHOS 93 07/19/2015   AST 26 07/19/2015   ALT 26 07/19/2015  .   Total  Time in preparing paper work, data evaluation and todays exam - 35 minutes  Krisalyn Yankowski M.D on 07/22/2015 at 10:27 PM    Note: This dictation was prepared with Dragon dictation along with smaller phrase technology. Any transcriptional errors that result from this process are unintentional.

## 2015-08-12 ENCOUNTER — Telehealth: Payer: Self-pay | Admitting: Cardiovascular Disease

## 2015-08-12 NOTE — Telephone Encounter (Signed)
[facility] d[facility]. Per d/c notes: "3.NSTEMI:elevated troponin; seen by cardiology . follow-up with cardiology as an outpatient.#4 hypertension: Controlled. Hyperlipidemia continue statins."  Appt 09/21/15, 2:30pm with Dr. Kirke CorinArida. Attempted to contact pt. No answer on cell and voice mailbox is full. Will call again.

## 2015-08-13 NOTE — Telephone Encounter (Signed)
S/w pt daughter, [place], ([place] to notify of April 24 appt. She inquired of rescheduling lexi myoview that was scheduled at last OV. [facility] and lexi put on hold. Advised daughter to keep f/u appt to further discuss.  She is agreeable w/plan with no further questions.

## 2015-08-17 ENCOUNTER — Emergency Department: Payer: BLUE CROSS/BLUE SHIELD

## 2015-08-17 ENCOUNTER — Encounter: Payer: Self-pay | Admitting: *Deleted

## 2015-08-17 ENCOUNTER — Emergency Department
Admission: EM | Admit: 2015-08-17 | Discharge: 2015-08-17 | Disposition: A | Discharge status: Home / Self Care | Payer: BLUE CROSS/BLUE SHIELD | Attending: Emergency Medicine | Admitting: Emergency Medicine

## 2015-08-17 DIAGNOSIS — R509 Fever, unspecified: Secondary | ICD-10-CM | POA: Diagnosis present

## 2015-08-17 DIAGNOSIS — J069 Acute upper respiratory infection, unspecified: Secondary | ICD-10-CM | POA: Insufficient documentation

## 2015-08-17 DIAGNOSIS — I10 Essential (primary) hypertension: Secondary | ICD-10-CM | POA: Diagnosis not present

## 2015-08-17 DIAGNOSIS — Z87898 Personal history of other specified conditions: Secondary | ICD-10-CM | POA: Insufficient documentation

## 2015-08-17 DIAGNOSIS — Z7982 Long term (current) use of aspirin: Secondary | ICD-10-CM | POA: Insufficient documentation

## 2015-08-17 DIAGNOSIS — I359 Nonrheumatic aortic valve disorder, unspecified: Secondary | ICD-10-CM | POA: Insufficient documentation

## 2015-08-17 DIAGNOSIS — I2510 Atherosclerotic heart disease of native coronary artery without angina pectoris: Secondary | ICD-10-CM | POA: Diagnosis not present

## 2015-08-17 DIAGNOSIS — J9801 Acute bronchospasm: Secondary | ICD-10-CM | POA: Diagnosis not present

## 2015-08-17 DIAGNOSIS — E785 Hyperlipidemia, unspecified: Secondary | ICD-10-CM | POA: Insufficient documentation

## 2015-08-17 DIAGNOSIS — R05 Cough: Secondary | ICD-10-CM | POA: Diagnosis not present

## 2015-08-17 DIAGNOSIS — Z79899 Other long term (current) drug therapy: Secondary | ICD-10-CM | POA: Diagnosis not present

## 2015-08-17 DIAGNOSIS — E669 Obesity, unspecified: Secondary | ICD-10-CM | POA: Insufficient documentation

## 2015-08-17 DIAGNOSIS — A419 Sepsis, unspecified organism: Secondary | ICD-10-CM | POA: Insufficient documentation

## 2015-08-17 DIAGNOSIS — Z951 Presence of aortocoronary bypass graft: Secondary | ICD-10-CM | POA: Diagnosis not present

## 2015-08-17 LAB — BASIC METABOLIC PANEL
ANION GAP: 6 (ref 5–15)
BUN: 11 mg/dL (ref 6–20)
CHLORIDE: 103 mmol/L (ref 101–111)
CO2: 28 mmol/L (ref 22–32)
Calcium: 8 mg/dL — ABNORMAL LOW (ref 8.9–10.3)
Creatinine, Ser: 0.93 mg/dL (ref 0.61–1.24)
GFR calc Af Amer: >60 mL/min (ref >60)
GLUCOSE: 106 mg/dL — AB (ref 65–99)
POTASSIUM: 2.9 mmol/L — AB (ref 3.5–5.1)
Sodium: 137 mmol/L (ref 135–145)

## 2015-08-17 LAB — CBC
HEMATOCRIT: 46.7 % (ref 40.0–52.0)
HEMOGLOBIN: 15.6 g/dL (ref 13.0–18.0)
MCH: 29 pg (ref 26.0–34.0)
MCHC: 33.5 g/dL (ref 32.0–36.0)
MCV: 86.6 fL (ref 80.0–100.0)
Platelets: 104 10*3/uL — ABNORMAL LOW (ref 150–440)
RBC: 5.39 MIL/uL (ref 4.40–5.90)
RDW: 14.1 % (ref 11.5–14.5)
WBC: 3.5 10*3/uL — ABNORMAL LOW (ref 3.8–10.6)

## 2015-08-17 LAB — TROPONIN I: Troponin I: <0.03 ng/mL (ref <0.031)

## 2015-08-17 LAB — RAPID INFLUENZA A&B ANTIGENS (ARMC ONLY): INFLUENZA A (ARMC): NEGATIVE

## 2015-08-17 LAB — RAPID INFLUENZA A&B ANTIGENS: Influenza B (ARMC): NEGATIVE

## 2015-08-17 MED ORDER — PREDNISONE 10 MG PO TABS
50.0000 mg | ORAL_TABLET | Freq: Once | ORAL | Status: AC
  Administered 2015-08-17: 50 mg via ORAL
  Filled 2015-08-17: qty 2

## 2015-08-17 MED ORDER — IPRATROPIUM-ALBUTEROL 0.5-2.5 (3) MG/3ML IN SOLN
3.0000 mL | Freq: Once | RESPIRATORY_TRACT | Status: AC
  Administered 2015-08-17: 3 mL via RESPIRATORY_TRACT
  Filled 2015-08-17: qty 3

## 2015-08-17 MED ORDER — POTASSIUM CHLORIDE CRYS ER 20 MEQ PO TBCR
40.0000 meq | EXTENDED_RELEASE_TABLET | Freq: Once | ORAL | Status: AC
  Administered 2015-08-17: 40 meq via ORAL
  Filled 2015-08-17: qty 2

## 2015-08-17 MED ORDER — PREDNISONE 10 MG PO TABS
50.0000 mg | ORAL_TABLET | Freq: Every day | ORAL | Status: DC
Start: 2015-08-17 — End: 2015-09-21

## 2015-08-17 NOTE — Discharge Instructions (Signed)
Return to ER for any worsening trouble breathing, chest pain, passing out or altered mental status.  Drink plenty of fluids. Use albuterol inhaler 2 puffs every 4 hours as needed for wheezing and coughing.   Bronchospasm, Adult A bronchospasm is a spasm or tightening of the airways going into the lungs. During a bronchospasm breathing becomes more difficult because the airways get smaller. When this happens there can be coughing, a whistling sound when breathing (wheezing), and difficulty breathing. Bronchospasm is often associated with asthma, but not all patients who experience a bronchospasm have asthma. CAUSES  A bronchospasm is caused by inflammation or irritation of the airways. The inflammation or irritation may be triggered by:   Allergies (such as to animals, pollen, food, or mold). Allergens that cause bronchospasm may cause wheezing immediately after exposure or many hours later.   Infection. Viral infections are believed to be the most common cause of bronchospasm.   Exercise.   Irritants (such as pollution, cigarette smoke, strong odors, aerosol sprays, and paint fumes).   Weather changes. Winds increase molds and pollens in the air. Rain refreshes the air by washing irritants out. Cold air may cause inflammation.   Stress and emotional upset.  SIGNS AND SYMPTOMS   Wheezing.   Excessive nighttime coughing.   Frequent or severe coughing with a simple cold.   Chest tightness.   Shortness of breath.  DIAGNOSIS  Bronchospasm is usually diagnosed through a history and physical exam. Tests, such as chest X-rays, are sometimes done to look for other conditions. TREATMENT   Inhaled medicines can be given to open up your airways and help you breathe. The medicines can be given using either an inhaler or a nebulizer machine.  Corticosteroid medicines may be given for severe bronchospasm, usually when it is associated with asthma. HOME CARE INSTRUCTIONS   Always  have a plan prepared for seeking medical care. Know when to call your health care provider and local emergency services (911 in the [place] Know where you can access local emergency care.  Only take medicines as directed by your health care provider.  If you were prescribed an inhaler or nebulizer machine, ask your health care provider to explain how to use it correctly. Always use a spacer with your inhaler if you were given one.  It is necessary to remain calm during an attack. Try to relax and breathe more slowly.  Control your home environment in the following ways:   Change your heating and air conditioning filter at least once a month.   Limit your use of fireplaces and wood stoves.  Do not smoke and do not allow smoking in your home.   Avoid exposure to perfumes and fragrances.   Get rid of pests (such as roaches and mice) and their droppings.   Throw away plants if you see mold on them.   Keep your house clean and dust free.   Replace carpet with wood, tile, or vinyl flooring. Carpet can trap dander and dust.   Use allergy-proof pillows, mattress covers, and box spring covers.   Wash bed sheets and blankets every week in hot water and dry them in a dryer.   Use blankets that are made of polyester or cotton.   Wash hands frequently. SEEK MEDICAL CARE IF:   You have muscle aches.   You have chest pain.   The sputum changes from clear or white to yellow, green, gray, or bloody.   The sputum you cough up gets thicker.  There are problems that may be related to the medicine you are given, such as a rash, itching, swelling, or trouble breathing.  SEEK IMMEDIATE MEDICAL CARE IF:   You have worsening wheezing and coughing even after taking your prescribed medicines.   You have increased difficulty breathing.   You develop severe chest pain. MAKE SURE YOU:   Understand these instructions.  Will watch your condition.  Will get help right away  if you are not doing well or get worse.   This information is not intended to replace advice given to you by your health care provider. Make sure you discuss any questions you have with your health care provider.   Document Released: 05/19/2003 Document Revised: 06/06/2014 Document Reviewed: 11/05/2012 Elsevier Interactive Patient Education 2016 Else[employer] Inc.  Upper Respiratory Infection, Adult Most upper respiratory infections (URIs) are caused by a virus. A URI affects the nose, throat, and upper air passages. The most common type of URI is often called "the common cold." HOME CARE   Take medicines only as told by your doctor.  Gargle warm saltwater or take cough drops to comfort your throat as told by your doctor.  Use a warm mist humidifier or inhale steam from a shower to increase air moisture. This may make it easier to breathe.  Drink enough fluid to keep your pee (urine) clear or pale yellow.  Eat soups and other clear broths.  Have a healthy diet.  Rest as needed.  Go back to work when your fever is gone or your doctor says it is okay.  You may need to stay home longer to avoid giving your URI to others.  You can also wear a face mask and wash your hands often to prevent spread of the virus.  Use your inhaler more if you have asthma.  Do not use any tobacco products, including cigarettes, chewing tobacco, or electronic cigarettes. If you need help quitting, ask your doctor. GET HELP IF:  You are getting worse, not better.  Your symptoms are not helped by medicine.  You have chills.  You are getting more short of breath.  You have brown or red mucus.  You have yellow or brown discharge from your nose.  You have pain in your face, especially when you bend forward.  You have a fever.  You have puffy (swollen) neck glands.  You have pain while swallowing.  You have white areas in the back of your throat. GET HELP RIGHT AWAY IF:   You have very bad or  constant:  Headache.  Ear pain.  Pain in your forehead, behind your eyes, and over your cheekbones (sinus pain).  Chest pain.  You have long-lasting (chronic) lung disease and any of the following:  Wheezing.  Long-lasting cough.  Coughing up blood.  A change in your usual mucus.  You have a stiff neck.  You have changes in your:  Vision.  Hearing.  Thinking.  Mood. MAKE SURE YOU:   Understand these instructions.  Will watch your condition.  Will get help right away if you are not doing well or get worse.   This information is not intended to replace advice given to you by your health care provider. Make sure you discuss any questions you have with your health care provider.   Document Released: 11/02/2007 Document Revised: 09/30/2014 Document Reviewed: 08/21/2013 Elsevier Interactive Patient Education [employer]

## 2015-08-17 NOTE — ED Provider Notes (Signed)
[facility] Emergency Department Provider Note   ____________________________________________  Time seen:  I have reviewed the triage vital signs and the triage nursing note.  HISTORY  Chief Complaint Fever; Headache; and Cough   Historian Patient, through Spanish interpreter, with young family members who speak [place].  HPI James Yates is a 60 y.o. male who [facility] and due to fever and congestion was sent to the ED for further evaluation. He is reporting cough and congestion for about 6 days. Fever today. He's had some chest discomfort in the central chest with coughing. He has an albuterol inhaler from a month or so ago when he was diagnosed with wheezing in the emergency room.  No significant chest pain today. Cough is nonproductive of sputum.  He was reportedly febrile and tachycardic, he was sent for possible sepsis evaluation.    Past Medical History  Diagnosis Date  . Medical history non-contributory   . Glucose intolerance (impaired glucose tolerance)   . MI (myocardial infarction) (HCC)   . Obesity   . Bronchitis   . Coronary artery disease     Non-ST elevation myocardial infarction cardiac catheterization showed significant three-vessel coronary artery disease. 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 Ejection fraction was 60%.  . Hyperlipidemia   . Hypertension   . Aortic valve disease     bicuspid aortic valve with mild stenosis    Patient Active Problem List   Diagnosis Date Noted  . Elevated troponin   . Sepsis (HCC) 07/19/2015  . Fatigue 07/06/2015  . Aortic stenosis 06/23/2014  . Leg edema, right 09/02/2013  . Coronary artery disease   . Hyperlipidemia   . Hypertension   . 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 NSTEMI (non-ST elevated myocardial infarction) (HCC) 07/02/2013    Past Surgical History  Procedure Laterality Date  . No past surgeries     . Coronary artery bypass graft N/A 07/05/2013    Procedure: CORONARY ARTERY BYPASS GRAFTING (CABG);  Surgeon: Kerin PernaPeter Van Trigt, MD;  [facility];  Service: Open Heart Surgery;  Laterality: N/A;  . Intraoperative transesophageal echocardiogram N/A 07/05/2013    Procedure: INTRAOPERATIVE TRANSESOPHAGEAL ECHOCARDIOGRAM;  Surgeon: Kerin PernaPeter Van Trigt, MD;  [facility];  Service: Open Heart Surgery;  Laterality: N/A;  . Cardiac catheterization  06/2013    [facility]    Current Outpatient Rx  Name  Route  Sig  Dispense  Refill  . albuterol (PROVENTIL HFA;VENTOLIN HFA) 108 (90 Base) MCG/ACT inhaler   Inhalation   Inhale 2 puffs into the lungs every 6 (six) hours as needed for wheezing or shortness of breath.   1 Inhaler   2   . aspirin EC 325 MG EC tablet   Oral   Take 1 tablet (325 mg total) by mouth daily.   30 tablet   0   . atorvastatin (LIPITOR) 80 MG tablet      TAKE 1 TABLET (80 MG TOTAL) BY MOUTH DAILY AT 6 PM.   90 tablet   3   . ibuprofen (ADVIL,MOTRIN) 100 MG tablet   Oral   Take 200 mg by mouth every 6 (six) hours as needed for pain.          . metoprolol (LOPRESSOR) 100 MG tablet   Oral   Take 1 tablet (100 mg total) by mouth 2 (two) times daily.   60  tablet   10   . predniSONE (DELTASONE) 10 MG tablet   Oral   Take 5 tablets (50 mg total) by mouth daily.   20 tablet   0     Allergies Review of patient's allergies indicates no known allergies.  Family History  Problem Relation Age of Onset  . Heart disease Father   . Heart attack Father     Social History Social History  Substance Use Topics  . Smoking status: Never Smoker   . Smokeless tobacco: None  . Alcohol Use: No    Review of Systems  Constitutional: Negative for fever. Eyes: Negative for visual changes. ENT: Negative for sore throat. Cardiovascular: Chest pain with coughing, feels like pressure. Respiratory: Positive for shortness of breath. Gastrointestinal: Negative for abdominal pain,  vomiting and diarrhea. Genitourinary: Negative for dysuria. Musculoskeletal: Negative for back pain. Skin: Negative for rash. Neurological: Positive for headache with coughing. 10 point Review of Systems otherwise negative ____________________________________________   PHYSICAL EXAM:  VITAL SIGNS: ED Triage Vitals  Enc Vitals Group     BP 08/17/15 1016 141/95 mmHg     Pulse Rate 08/17/15 1016 104     Resp 08/17/15 1016 18     Temp 08/17/15 1016 99.4 F (37.4 C)     Temp Source 08/17/15 1016 Oral     SpO2 08/17/15 1016 99 %     Weight 08/17/15 1016 193 lb (87.544 kg)     Height 08/17/15 1016  (1.651 m)     Head Cir --      Peak Flow --      Pain Score 08/17/15 1017 5     Pain Loc --      Pain Edu? --      Excl. in GC? --      Constitutional: Alert and oriented. Well appearing and in no distress. HEENT   Head: Normocephalic and atraumatic.      Eyes: Conjunctivae are normal. PERRL. Normal extraocular movements.      Ears:         Nose: No congestion/rhinnorhea.   Mouth/Throat: Mucous membranes are moist.   Neck: No stridor. Cardiovascular/Chest: Normal rate, regular rhythm.  No murmurs, rubs, or gallops. Respiratory: Normal respiratory effort without tachypnea nor retractions. Decent air movement, but very wheezy cough. No rhonchi. Gastrointestinal: Soft. No distention, no guarding, no rebound. Nontender.    Genitourinary/rectal:Deferred Musculoskeletal: Nontender with normal range of motion in all extremities. No joint effusions.  No lower extremity tenderness.  No edema. Neurologic:  Normal speech and language. No gross or focal neurologic deficits are appreciated. Skin:  Skin is warm, dry and intact. No rash noted. Psychiatric: Mood and affect are normal. Speech and behavior are normal. Patient exhibits appropriate insight and judgment.  ____________________________________________   EKG I, Governor Rooks, MD, the attending physician have personally  viewed and interpreted all ECGs.  100 bpm. Normal sinus rhythm. Right bundle branch block. Undetermined axis. Nonspecific T-wave ____________________________________________  LABS (pertinent positives/negatives)  Rapid influenza A and B- Basic metabolic panel significant for potassium 2.9 White blood count 3.5, hemoglobin 15.6 and platelet count 104 Troponin less than 0.03  ____________________________________________  RADIOLOGY All Xrays were viewed by me. Imaging interpreted by Radiologist.  Chest two-view: Post CABG. Bilateral scarring. No superimposed acute abnormality __________________________________________  PROCEDURES  Procedure(s) performed: None  Critical Care performed: None  ____________________________________________   ED COURSE / ASSESSMENT AND PLAN  Pertinent labs & imaging results that were available during my care of  the patient were reviewed by me and considered in my medical decision making (see chart for details).   This patient was sent here for fever and slight tachycardia. On my exam his heart rate is in the 90s, and is not in any respiratory distress. Patient was given potassium repletion for mildly low potassium.  He's complaining of some chest discomfort only when he is coughing, he does have a very wheezy cough. His chest x-ray shows no evidence of pneumonia. He is not hypoxic.  He's had no hypotension. I do not suspect sepsis.  He states he was diagnosed with asthma recently, and has albuterol at home, given his persistent wheezing, I am going to add prednisone.    CONSULTATIONS:   None   Patient / Family / Caregiver informed of clinical course, medical decision-making process, and agree with plan.   I discussed return precautions, follow-up instructions, and discharged instructions with patient and/or family.   ___________________________________________   FINAL CLINICAL IMPRESSION(S) / ED DIAGNOSES   Final diagnoses:   Bronchospasm, acute  Viral upper respiratory illness              Note: This dictation was prepared with Dragon dictation. Any transcriptional errors that result from this process are unintentional   Governor Rooks, MD 08/17/15 [phone]

## 2015-08-17 NOTE — ED Notes (Signed)
Unable to obtain signature due to no signature pad available. Pt given written and verbal discharge instructions. Pt verbalized understanding.

## 2015-08-17 NOTE — ED Notes (Addendum)
States cough, congestion, chest pain, with sweating, pt awake and alert during assessment, states headache as well, states sympotms began on Friday

## 2015-09-21 ENCOUNTER — Ambulatory Visit (INDEPENDENT_AMBULATORY_CARE_PROVIDER_SITE_OTHER): Payer: BLUE CROSS/BLUE SHIELD | Admitting: Cardiovascular Disease

## 2015-09-21 ENCOUNTER — Encounter: Payer: Self-pay | Admitting: Cardiovascular Disease

## 2015-09-21 VITALS — BP 124/90 | HR 70 | Ht 65.0 in | Wt 198.0 lb

## 2015-09-21 DIAGNOSIS — I10 Essential (primary) hypertension: Secondary | ICD-10-CM

## 2015-09-21 DIAGNOSIS — I471 Supraventricular tachycardia, unspecified: Secondary | ICD-10-CM

## 2015-09-21 DIAGNOSIS — I25118 Atherosclerotic heart disease of native coronary artery with other forms of angina pectoris: Secondary | ICD-10-CM | POA: Diagnosis not present

## 2015-09-21 NOTE — Patient Instructions (Addendum)
Medication Instructions:  Your physician recommends that you continue on your current medications as directed. Please refer to the Current Medication list given to you today.   Labwork: BMET, CBC, lipid and liver profile today  Testing/Procedures: none  Follow-Up: Your physician recommends that you schedule a follow-up appointment in: two weeks with Dr. Kirke CorinArida.    Any Other Special Instructions Will Be Listed Below (If Applicable). Appt: May 1, 10:30am Dr. Jolayne HainesSonnenberg  [facility] [address] [place] [phone]     If you need a refill on your cardiac medications before your next appointment, please call your pharmacy.

## 2015-09-21 NOTE — Progress Notes (Signed)
Cardiology Office Note   Date:  09/21/2015   ID:  James Yates, DOB 25-Jul-1955, MRN [id number]  PCP: None Cardiologist:   James Bears, MD   Chief Complaint  Patient presents with  . other    F/u Hospital c/o headaches, sob and chest pain. Meds reviewed verbally with pt.      History of Present Illness: James Yates is a 61 y.o. male who presents for a followup visit regarding coronary artery disease status post CABG in February of 2015 after NSTEMI.  Other medical problems include hypertension, hyperlipidemia and bicuspid aortic valve with mild to moderate stenosis noted on intraoperative TEE. Most recent echocardiogram in February 2017 showed normal LV systolic function with mild to moderate aortic stenosis with a peak gradient of 24 mmHg. The patient was hospitalized in February with what seems to be a viral illness after he returned back from [place]. He was seen by Dr. Sampson Yates to check for other infectious etiologies. Testing for influenza was negative and his cultures were negative. He was significantly tachycardic on presentation with borderline elevated troponin. Echocardiogram showed normal LV systolic function. He returned to the emergency room in March with wheezing and mild tachycardia. He is still recovering slowly with gradual improvement. He continues to have mild headache and night sweats. No chest pain. He reports that the shortness of breath is improving.   Past Medical History  Diagnosis Date  . Medical history non-contributory   . Glucose intolerance (impaired glucose tolerance)   . MI (myocardial infarction) (HCC)   . Obesity   . Bronchitis   . Coronary artery disease     Non-ST elevation myocardial infarction cardiac catheterization showed significant three-vessel coronary artery disease. 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 Ejection fraction was 60%.  . Hyperlipidemia   .  Hypertension   . Aortic valve disease     bicuspid aortic valve with mild stenosis    Past Surgical History  Procedure Laterality Date  . No past surgeries    . Coronary artery bypass graft N/A 07/05/2013    Procedure: CORONARY ARTERY BYPASS GRAFTING (CABG);  Surgeon: James Perna, MD;  Location: [facility] OR;  Service: Open Heart Surgery;  Laterality: N/A;  . Intraoperative transesophageal echocardiogram N/A 07/05/2013    Procedure: INTRAOPERATIVE TRANSESOPHAGEAL ECHOCARDIOGRAM;  Surgeon: James Perna, MD;  Location: [facility] OR;  Service: Open Heart Surgery;  Laterality: N/A;  . Cardiac catheterization  06/2013    [facility]     Current Outpatient Prescriptions  Medication Sig Dispense Refill  . aspirin EC 325 MG EC tablet Take 1 tablet (325 mg total) by mouth daily. 30 tablet 0  . atorvastatin (LIPITOR) 80 MG tablet TAKE 1 TABLET (80 MG TOTAL) BY MOUTH DAILY AT 6 PM. 90 tablet 3  . ibuprofen (ADVIL,MOTRIN) 100 MG tablet Take 200 mg by mouth every 6 (six) hours as needed for pain.     . metoprolol (LOPRESSOR) 100 MG tablet Take 1 tablet (100 mg total) by mouth 2 (two) times daily. 60 tablet 10   No current facility-administered medications for this visit.    Allergies:   Review of patient's allergies indicates no known allergies.    Social History:  The patient  reports that he has never smoked. He does not have any smokeless tobacco history on file. He reports that he does not drink alcohol or use illicit drugs.  Family History:  The patient's family history includes Heart attack in his father; Heart disease in his father.    ROS:  Please see the history of present illness.   Otherwise, review of systems are positive for none.   All other systems are reviewed and negative.    PHYSICAL EXAM: VS:  Ht 5\' 5"  (1.651 m)  Wt 198 lb (89.812 kg)  BMI 32.95 kg/m2 , BMI Body mass index is 32.95 kg/(m^2). GEN: Well nourished, well developed, in no acute distress HEENT: normal Neck: no JVD, carotid  bruits, or masses Cardiac: RRR; no  rubs, or gallops,no edema . There is a 2/6 crescendo decrescendo aortic murmur which is mid peaking. Respiratory:  clear to auscultation bilaterally, normal work of breathing GI: soft, nontender, nondistended, + BS MS: no deformity or atrophy Skin: warm and dry, no rash Neuro:  Strength and sensation are intact Psych: euthymic mood, full affect   EKG:  EKG is ordered today. The ekg ordered today demonstrates normal sinus rhythm with right bundle branch block.   Recent Labs: 07/19/2015: ALT 26 08/17/2015: BUN 11; Creatinine, Ser 0.93; Hemoglobin 15.6; Platelets 104*; Potassium 2.9*; Sodium 137    Lipid Panel    Component Value Date/Time   CHOL 125 09/02/2013 1540   CHOL 215* 07/02/2013 0058   TRIG 102 09/02/2013 1540   TRIG 143 07/02/2013 0058   HDL 37* 09/02/2013 1540   HDL 38* 07/02/2013 0058   CHOLHDL 3.4 09/02/2013 1540   VLDL 29 07/02/2013 0058   LDLCALC 68 09/02/2013 1540   LDLCALC 148* 07/02/2013 0058      Wt Readings from Last 3 Encounters:  09/21/15 198 lb (89.812 kg)  08/17/15 193 lb (87.544 kg)  07/22/15 191 lb 12.8 oz (87 kg)        ASSESSMENT AND PLAN:  1.  Coronary artery disease involving native coronary arteries: The patient is recovering from a recent viral illness. He had borderline elevated troponin the setting of significant tachycardia and was likely due to supply demand ischemia. He is not fully recovered from his recent illness although he reports gradual improvement every day. I'm going to wait another 2 months before ordering a stress test as was planned before. Continue aspirin, metoprolol and atorvastatin.  2. Bicuspid aortic valve with mild to moderate stenosis. This was checked in February in 2017 and was stable. Given the persistence of night sweats, I'm going to check CBC. He currently does not have a primary care physician and he was referred today. If he has persistent leukocytosis, I will refer him to  see James Yates.  3. Essential hypertension: Blood pressure is controlled on metoprolol.  4. Hyperlipidemia: Continue high dose atorvastatin. I requested  lipid and liver profile today.     Disposition:   FU with me in 2 months  Signed,  James BearsMuhammad Stefania Goulart, MD  09/21/2015 2:38 PM    [facility]

## 2015-09-22 LAB — CBC
HEMATOCRIT: 44 % (ref 37.5–51.0)
HEMOGLOBIN: 15.3 g/dL (ref 12.6–17.7)
MCH: 29.9 pg (ref 26.6–33.0)
MCHC: 34.8 g/dL (ref 31.5–35.7)
MCV: 86 fL (ref 79–97)
Platelets: 202 10*3/uL (ref 150–379)
RBC: 5.12 x10E6/uL (ref 4.14–5.80)
RDW: 14 % (ref 12.3–15.4)
WBC: 6.9 10*3/uL (ref 3.4–10.8)

## 2015-09-22 LAB — BASIC METABOLIC PANEL
BUN/Creatinine Ratio: 13 (ref 9–20)
BUN: 10 mg/dL (ref 6–24)
CALCIUM: 8.5 mg/dL — AB (ref 8.7–10.2)
CHLORIDE: 99 mmol/L (ref 96–106)
CO2: 22 mmol/L (ref 18–29)
CREATININE: 0.8 mg/dL (ref 0.76–1.27)
GFR calc Af Amer: 113 mL/min/{1.73_m2} (ref >59)
GFR calc non Af Amer: 98 mL/min/{1.73_m2} (ref >59)
GLUCOSE: 105 mg/dL — AB (ref 65–99)
Potassium: 3.7 mmol/L (ref 3.5–5.2)
Sodium: 139 mmol/L (ref 134–144)

## 2015-09-22 LAB — LIPID PANEL
Chol/HDL Ratio: 4.1 ratio (ref 0.0–5.0)
Cholesterol, Total: 159 mg/dL (ref 100–199)
HDL: 39 mg/dL — ABNORMAL LOW
LDL Calculated: 78 mg/dL (ref 0–99)
Triglycerides: 208 mg/dL — ABNORMAL HIGH (ref 0–149)
VLDL Cholesterol Cal: 42 mg/dL — ABNORMAL HIGH (ref 5–40)

## 2015-09-22 LAB — HEPATIC FUNCTION PANEL
ALT: 20 IU/L (ref 0–44)
AST: 19 IU/L (ref 0–40)
Albumin: 4.3 g/dL (ref 3.5–5.5)
Alkaline Phosphatase: 101 IU/L (ref 39–117)
Bilirubin Total: 0.6 mg/dL (ref 0.0–1.2)
Bilirubin, Direct: 0.14 mg/dL (ref 0.00–0.40)
Total Protein: 7.2 g/dL (ref 6.0–8.5)

## 2015-09-28 ENCOUNTER — Ambulatory Visit (INDEPENDENT_AMBULATORY_CARE_PROVIDER_SITE_OTHER): Payer: BLUE CROSS/BLUE SHIELD | Admitting: Family Medicine

## 2015-09-28 ENCOUNTER — Encounter: Payer: Self-pay | Admitting: Family Medicine

## 2015-09-28 ENCOUNTER — Ambulatory Visit: Payer: BLUE CROSS/BLUE SHIELD | Admitting: Family Medicine

## 2015-09-28 VITALS — BP 128/90 | HR 71 | Temp 98.4°F | Ht 65.0 in | Wt 201.0 lb

## 2015-09-28 DIAGNOSIS — R61 Generalized hyperhidrosis: Secondary | ICD-10-CM

## 2015-09-28 DIAGNOSIS — I25118 Atherosclerotic heart disease of native coronary artery with other forms of angina pectoris: Secondary | ICD-10-CM | POA: Diagnosis not present

## 2015-09-28 DIAGNOSIS — R1310 Dysphagia, unspecified: Secondary | ICD-10-CM

## 2015-09-28 DIAGNOSIS — R079 Chest pain, unspecified: Secondary | ICD-10-CM

## 2015-09-28 DIAGNOSIS — R1030 Lower abdominal pain, unspecified: Secondary | ICD-10-CM

## 2015-09-28 NOTE — Patient Instructions (Signed)
Nice to meet you. We are going to obtain some lab work to evaluate your night sweats. We're going to refer you to GI for your swallowing issue. Please continue to monitor your abdominal discomfort. If it worsens, or you develop blood in your stool, nausea, vomiting, diarrhea, fevers, or any new or changing symptoms seek medical attention. If you develop chest pain, shortness of breath, palpitations, headache, numbness, weakness, or any new or changing symptoms please seek medical attention.  Angina de pecho (Angina Pectoris) La angina de pecho es una sensacin de molestia extrema en el pecho, el cuello o el brazo. El mdico podr llamarla simplemente angina. Hay cuatro tipos de angina de pecho. La angina es causada por la falta de sangre en la capa media y ms gruesa de la pared del corazn (miocardio). Se puede sentir como un dolor que aplasta o retuerce en el pecho. Se puede sentir Insurance account manager u opresin fuerte en el pecho. Algunas personas dicen que se siente como empacho, acidez estomacal o gases. Algunas personas pueden tener sntomas diferentes al [employer]. Estos incluyen los siguientes:  Falta de Panther.  Sudor fro.  Ganas de vomitar (nuseas).  Sensacin de desvanecimiento. Muchas mujeres tienen [employer] pecho y algunos de los otros sntomas. Sin embargo, a Research scientist (medical), como:  Sensacin de Merchant navy officer (fatiga).  Nerviosismo o preocupacin sin causa aparente.  Debilidad sin causa aparente.  Mareos o [employer]. Las mujeres pueden tener angina sin presentar sntomas. CUIDADOS EN EL HOGAR  Tome los medicamentos solamente como se lo haya indicado el mdico.  Cudese de otras afecciones como se lo haya indicado su mdico. Estos incluyen los siguientes:  Presin arterial elevada (hipertensin).  Diabetes.  Siga una dieta cardiosaludable. El mdico puede ayudarle a elegir opciones de alimentos saludables y a Forensic psychologist.  Consulte a su mdico  para conocer ms sobre mtodos de coccin saludables y selos. Estos incluyen los siguientes:  Asar.  Grillar.  Hervir.  Hornear.  Escalfar.  Cocer al vapor.  Saltear.  Siga un programa de ejercicios autorizado por su mdico.  Mantenga un peso saludable. Pierda peso como se lo haya indicado su mdico.  Descanse cuando se sienta cansado.  Aprenda a Dealer.  No consuma ningn producto que contenga tabaco, como cigarrillos, tabaco de Theatre manager o Administrator, Civil Service. Si necesita ayuda para dejar de fumar, consulte al mdico.  Si bebe alcohol y su mdico lo autoriza, limtese a no ms de 1 vaso por da. Una medida equivale a 12onzas de cerveza, 5onzas de vino o 1onzas de bebidas alcohlicas de alta graduacin.  No consuma drogas.  Concurra a todas las visitas de control como se lo haya indicado el mdico. Esto es importante. No tome los siguientes medicamentos a menos que el mdico lo autorice:  Antiinflamatorios no esteroides (Murriel Hopper), como:  Ibuprofeno.  Naproxeno.  Celecoxib.  Suplementos vitamnicos que contienen vitamina A, vitamina E o ambas.  Tratamientos de reposicin hormonal que contienen estrgeno con o sin progestina. SOLICITE AYUDA DE INMEDIATO SI:  Tiene dolor en el pecho, el cuello, el brazo, la Mabie, el estmago o la espalda que:  Dura ms de unos minutos.  Se repite.  No mejora despus de tomar medicamentos sublinguales (nitroglicerina sublingual).  Tiene alguno de estos sntomas sin ningn motivo:  Gases, Merchant navy officer, empacho.  Sudoracin abundante.  Falta de aire o dificultad para respirar.  Malestar estomacal y vmitos.  Sentirse ms cansado que de [place].  Sentirse nervioso o ms preocupado  que lo habitual.  Sensacin de debilidad.  Diarrea.  Se siente mareado o sufre un desmayo de forma repentina.  Se desmaya o pierde el conocimiento. Estos sntomas pueden Customer service manager. No espere hasta  que los sntomas desaparezcan. Solicite atencin mdica de inmediato. Comunquese con el servicio de emergencias de su localidad (911 en los [place]). No conduzca por sus propios medios [employer].   Esta informacin no tiene Theme park manager el consejo del mdico. Asegrese de hacerle al mdico cualquier pregunta que tenga.   Document Released: 05/02/2012 Document Revised: 06/06/2014 Elsevier Interactive Patient Education 2016 Elsevier Inc.  Angina Pectoris Angina pectoris is a very bad feeling in the chest, neck, or arm. Your doctor may call it angina. There are four types of angina. Angina is caused by a lack of blood in the middle and thickest layer of the heart wall (myocardium). Angina may feel like a crushing or squeezing pain in the chest. It may feel like tightness or heavy pressure in the chest. Some people say it feels like gas, heartburn, or indigestion. Some people have symptoms other than pain. These include:  Shortness of breath.  Cold sweats.  Feeling sick to your stomach (nausea).  Feeling light-headed. Many women have chest discomfort and some of the other symptoms. However, women often have different symptoms, such as:  Feeling tired (fatigue).  Feeling nervous for no reason.  Feeling weak for no reason.  Dizziness or fainting. Women may have angina without any symptoms. HOME CARE  Take medicines only as told by your doctor.  Take care of other health issues as told by your doctor. These include:  High blood pressure (hypertension).  Diabetes.  Follow a heart-healthy diet. Your doctor can help you to choose healthy food options and make changes.  Talk to your doctor to learn more about healthy cooking methods and use them. These include:  Roasting.  Grilling.  Broiling.  Baking.  Poaching.  Steaming.  Stir-frying.  Follow an exercise program approved by your doctor.  Keep a healthy weight. Lose weight as told by your  doctor.  Rest when you are tired.  Learn to manage stress.  Do not use any tobacco, such as cigarettes, chewing tobacco, or electronic cigarettes. If you need help quitting, ask your doctor.  If you drink alcohol, and your doctor says it is okay, limit yourself to no more than 1 drink per day. One drink equals 12 ounces of beer, 5 ounces of wine, or 1 ounces of hard liquor.  Stop illegal drug use.  Keep all follow-up visits as told by your doctor. This is important. Do not take these medicines unless your doctor says that you can:  Nonsteroidal anti-inflammatory drugs (NSAIDs). These include:  Ibuprofen.  Naproxen.  Celecoxib.  Vitamin supplements that have vitamin A, vitamin E, or both.  Hormone therapy that contains estrogen with or without progestin. GET HELP RIGHT AWAY IF:  You have pain in your chest, neck, arm, jaw, stomach, or back that:  Lasts more than a few minutes.  Comes back.  Does not get better after you take medicine under your tongue (sublingual nitroglycerin).  You have any of these symptoms for no reason:  Gas, heartburn, or indigestion.  Sweating a lot.  Shortness of breath or trouble breathing.  Feeling sick to your stomach or throwing up.  Feeling more tired than usual.  Feeling nervous or worrying more than usual.  Feeling weak.  Diarrhea.  You are suddenly dizzy or  light-headed.  You faint or pass out. These symptoms may be an emergency. Do not wait to see if the symptoms will go away. Get medical help right away. Call your local emergency services (911 in the [place] Do not drive yourself to the hospital.   This information is not intended to replace advice given to you by your health care provider. Make sure you discuss any questions you have with your health care provider.   Document Released: 11/02/2007 Document Revised: 09/30/2014 Document Reviewed: 09/17/2013 Elsevier Interactive Patient Education [employer]2016 Elsevier Inc.

## 2015-09-28 NOTE — Progress Notes (Signed)
Pre visit review using our clinic review tool, if applicable. No additional management support is needed unless otherwise documented below in the visit note. 

## 2015-09-28 NOTE — Progress Notes (Signed)
Patient ID: James Yates, male   DOB: November 19, 1955, 60 y.o.   MRN: [id number]  Tommi Rumps, MD Phone: [phone]  James Yates is a 60 y.o. male who presents today for new patient visit.  CAD: Patient notes history of intermittent chest pain. Last chest pain episode was Saturday. Was centralized discomfort that felt as though somebody was sitting on his chest. He did have some trouble breathing with it. Had a frontal headache. Notes the chest pain only occurs when he is being physically active at work. Typically occurs if he is doing something too quickly though can occur at other times when being physically active. Resolves within 3-4 minutes when resting. No radiation. No diaphoresis. No history of DVT or PE. No CP or SOB at this time.   Night sweats: Patient notes he's had issues with night sweats since he had his CABG 2 years ago. He notes he sweats every night. Has to change shirts. Notes no cough. No persistent shortness of breath. No hemoptysis. Has traveled out of the country to [place] twice in the last 2 years.  Abdominal pain: Patient notes for the last year every several days he has lower abdominal discomfort that lasts all day. There is no fever. Some nausea. No vomiting or diarrhea. No blood in the stool. This stops on its own. Has normal bowel movements daily. No burning with urinating. No urinary frequency or urgency. No urinary issues.  Trouble swallowing: Patient notes for about a year he's had issues swallowing liquids and sometimes solids. Occasionally feels as though they minimally get stuck. Notes there is a soreness when he swallows. He does not take reflux medication. No alcohol use or tobacco abuse.  Active Ambulatory Problems    Diagnosis Date Noted  . NSTEMI (non-ST elevated myocardial infarction) ([place]) 07/02/2013  . 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 Coronary artery disease   . Hyperlipidemia   . Hypertension   . Leg edema, right 09/02/2013  . Aortic stenosis  06/23/2014  . Fatigue 07/06/2015  . Sepsis ([place]) 07/19/2015  . Elevated troponin   . Night sweats 09/29/2015  . Abdominal pain 09/29/2015  . Trouble swallowing 09/29/2015   Resolved Ambulatory Problems    Diagnosis Date Noted  . CAD (coronary artery disease), native coronary artery 07/03/2013   Past Medical History  Diagnosis Date  . Medical history non-contributory   . Glucose intolerance (impaired glucose tolerance)   . MI (myocardial infarction) ([place])   . Obesity   . Bronchitis   . Aortic valve disease     Family History  Problem Relation Age of Onset  . Heart disease Father   . Heart attack Father     Social History   Social History  . Marital Status: Married    Spouse Name: N/A  . Number of Children: N/A  . Years of Education: N/A   Occupational History  . Not on file.   Social History Main Topics  . Smoking status: Never Smoker   . Smokeless tobacco: Not on file  . Alcohol Use: No  . Drug Use: No  . Sexual Activity: Not on file   Other Topics Concern  . Not on file   Social History Narrative    ROS  General:  Negative for nexplained weight loss, fever Skin: Negative for new or changing mole, sore that won't heal HEENT: Positive for trouble hearing, ringing in ears, dysphagia, negative for trouble seeing, mouth sores, hoarseness, change in voice CV:  Positive for chest pain, dyspnea,  palpitations, negative for edema Resp: Negative for cough, dyspnea, hemoptysis GI: Positive for abdominal pain, Negative for nausea, vomiting, diarrhea, constipation, melena, hematochezia. GU: Negative for dysuria, incontinence, urinary hesitance, hematuria, vaginal or penile discharge, polyuria, sexual difficulty, lumps in testicle or breasts MSK: Positive for joint pain, Negative for muscle cramps or aches, joint swelling Neuro: Positive for headaches, negative for weakness, numbness, dizziness, passing out/fainting Psych: Negative for depression, anxiety, positive  for memory problems  Objective  Physical Exam Filed Vitals:   09/28/15 1437  BP: 128/90  Pulse: 71  Temp: 98.4 F (36.9 C)    BP Readings from Last 3 Encounters:  09/28/15 128/90  09/21/15 124/90  08/17/15 126/95   Wt Readings from Last 3 Encounters:  09/28/15 201 lb (91.173 kg)  09/21/15 198 lb (89.812 kg)  08/17/15 193 lb (87.544 kg)    Physical Exam  Constitutional: He is well-developed, well-nourished, and in no distress.  HENT:  Head: Normocephalic and atraumatic.  Right Ear: External ear normal.  Left Ear: External ear normal.  Mouth/Throat: Oropharynx is clear and moist. No oropharyngeal exudate.  Eyes: Conjunctivae are normal. Pupils are equal, round, and reactive to light.  Neck: Neck supple.  No supraclavicular lymphadenopathy  Cardiovascular: Normal rate, regular rhythm and normal heart sounds.   Pulmonary/Chest: Effort normal and breath sounds normal.  Abdominal: Soft. Bowel sounds are normal. He exhibits no distension. There is no tenderness. There is no rebound and no guarding.  Musculoskeletal: He exhibits no edema.  Lymphadenopathy:    He has no cervical adenopathy.  Neurological: He is alert. Gait normal.  Skin: Skin is warm and dry. He is not diaphoretic.  Psychiatric: Mood and affect normal.   EKG: Atrial rhythm, rate 70, right bundle branch block, no significant changes from previous  Assessment/Plan:   Coronary artery disease Patient with episode of chest pressure on Saturday resolved with rest consistent with angina. Resolved on its own with rest. He reports he has had these issues in the past. EKG appears relatively unchanged from previously. Does have known CAD and had an NSTEMI in the past. No symptoms at this time. Is followed by cardiology. It appears the plan is to do a stress test the next several months. He has no symptoms at this time. We'll forward this note to his cardiologist. He is given return precautions.  Night sweats Patient  reports several years of night sweats. No unexplained weight loss. No fevers. He has traveled out of the country night sweats preceded this. He had a benign CBC and CMP. We'll check lab work as outlined below to start the workup. He'll continue to monitor.  Abdominal pain Nonspecific intermittent lower abdominal pain for about a year. No other significant symptoms with this. Typically goes away on its own. Not consistent with appendiceal issue. We will obtain a sedimentation rate. We will also obtain a urine dip. He is asymptomatic today. We'll continue to monitor. If no improvement would consider imaging for further evaluation.  Trouble swallowing Patient reports issues with swallowing. We will refer to GI for consideration of EGD.    Orders Placed This Encounter  Procedures  . HIV antibody (with reflex)  . Sed Rate (ESR)  . Quantiferon tb gold assay (blood)  . TSH  . Ambulatory referral to Gastroenterology    Referral Priority:  Routine    Referral Type:  Consultation    Referral Reason:  Specialty Services Required    Number of Visits Requested:  1  . EKG 12-Lead  No orders of the defined types were placed in this encounter.     Tommi Rumps, MD [place]

## 2015-09-29 DIAGNOSIS — R1310 Dysphagia, unspecified: Secondary | ICD-10-CM | POA: Insufficient documentation

## 2015-09-29 DIAGNOSIS — R61 Generalized hyperhidrosis: Secondary | ICD-10-CM | POA: Insufficient documentation

## 2015-09-29 DIAGNOSIS — R109 Unspecified abdominal pain: Secondary | ICD-10-CM | POA: Insufficient documentation

## 2015-09-29 NOTE — Assessment & Plan Note (Signed)
Nonspecific intermittent lower abdominal pain for about a year. No other significant symptoms with this. Typically goes away on its own. Not consistent with appendiceal issue. We will obtain a sedimentation rate. We will also obtain a urine dip. He is asymptomatic today. We'll continue to monitor. If no improvement would consider imaging for further evaluation.

## 2015-09-29 NOTE — Assessment & Plan Note (Signed)
Patient reports issues with swallowing. We will refer to GI for consideration of EGD.

## 2015-09-29 NOTE — Assessment & Plan Note (Signed)
Patient reports several years of night sweats. No unexplained weight loss. No fevers. He has traveled out of the country night sweats preceded this. He had a benign CBC and CMP. We'll check lab work as outlined below to start the workup. He'll continue to monitor.

## 2015-09-29 NOTE — Assessment & Plan Note (Signed)
Patient with episode of chest pressure on Saturday resolved with rest consistent with angina. Resolved on its own with rest. He reports he has had these issues in the past. EKG appears relatively unchanged from previously. Does have known CAD and had an NSTEMI in the past. No symptoms at this time. Is followed by cardiology. It appears the plan is to do a stress test the next several months. He has no symptoms at this time. We'll forward this note to his cardiologist. He is given return precautions.

## 2015-10-07 ENCOUNTER — Ambulatory Visit: Payer: BLUE CROSS/BLUE SHIELD | Admitting: Nurse Practitioner

## 2015-10-07 ENCOUNTER — Encounter: Payer: Self-pay | Admitting: *Deleted

## 2015-10-09 ENCOUNTER — Encounter: Payer: Self-pay | Admitting: Cardiovascular Disease

## 2015-10-09 ENCOUNTER — Ambulatory Visit (INDEPENDENT_AMBULATORY_CARE_PROVIDER_SITE_OTHER): Payer: BLUE CROSS/BLUE SHIELD | Admitting: Cardiovascular Disease

## 2015-10-09 VITALS — BP 130/100 | HR 70 | Ht 65.0 in | Wt 201.2 lb

## 2015-10-09 DIAGNOSIS — R079 Chest pain, unspecified: Secondary | ICD-10-CM | POA: Diagnosis not present

## 2015-10-09 MED ORDER — AMLODIPINE BESYLATE 5 MG PO TABS: 5.0000 mg | ORAL_TABLET | Freq: Every day | ORAL | Status: DC

## 2015-10-09 NOTE — Patient Instructions (Addendum)
Medication Instructions:  Your physician has recommended you make the following change in your medication:  START taking amlodipine 5mg  once daily   Labwork: none  Testing/Procedures: Your physician has requested that you have a lexiscan myoview. For further information please visit [email or web address]. Please follow instruction sheet, as given.  [facility] MYOVIEW  Your caregiver has ordered a Stress Test with nuclear imaging. The purpose of this test is to evaluate the blood supply to your heart muscle. This procedure is referred to as a "Non-Invasive Stress Test." This is because other than having an IV started in your vein, nothing is inserted or "invades" your body. Cardiac stress tests are done to find areas of poor blood flow to the heart by determining the extent of coronary artery disease (CAD). Some patients exercise on a treadmill, which naturally increases the blood flow to your heart, while others who are  unable to walk on a treadmill due to physical limitations have a pharmacologic/chemical stress agent called Lexiscan . This medicine will mimic walking on a treadmill by temporarily increasing your coronary blood flow.   Please note: these test may take anywhere between 2-4 hours to complete  [facility]  THE VOLUNTEERS AT THE FIRST DESK WILL DIRECT YOU WHERE TO GO  Date of Procedure:____Monday, May 22 Arrival Time for Procedure: 8:15am  Instructions regarding medication:     _xx___:  Hold metoprolol the morning of procedure    PLEASE NOTIFY THE OFFICE AT LEAST 24 HOURS IN ADVANCE IF YOU ARE UNABLE TO KEEP YOUR APPOINTMENT.  [phone] AND  PLEASE NOTIFY [facility] AT [facility] AT LEAST 24 HOURS IN ADVANCE IF YOU ARE UNABLE TO KEEP YOUR APPOINTMENT. [phone]  How to prepare for your Myoview test:   Do not eat or drink after midnight  No caffeine for 24 hours prior to test  No smoking 24 hours prior to test.  Your medication  may be taken with water.  If your doctor stopped a medication because of this test, do not take that medication.  Ladies, please do not wear dresses.  Skirts or pants are appropriate. Please wear a short sleeve shirt.  No perfume, cologne or lotion.  Wear comfortable walking shoes. No heels!            Follow-Up: Your physician recommends that you schedule a follow-up appointment in: 3 months with Dr. Kirke CorinArida.    Any Other Special Instructions Will Be Listed Below (If Applicable).     If you need a refill on your cardiac medications before your next appointment, please call your pharmacy.  Gammagrafa cardaca (Cardiac Nuclear Scanning) Se utiliza una gammagrafa cardaca para controlar si hay problemas en el corazn, por ejemplo, los siguientes:  Una regin del corazn no recibe suficiente sangre.  Parte del msculo cardaco ha muerto, lo que ocurre cuando se produce un ataque cardaco.  La pared del corazn no funciona normalmente. Para este estudio, se inyecta un colorante radiactivo (marcador) en el torrente sanguneo. Despus de que el marcador se haya dirigido al corazn, se [place]a un dispositivo de gammagrafa para medir la cantidad de Engineer, manufacturing systemsmarcador que es absorbida por diferentes zonas del corazn o que se distribuye en ellas. INFORME AL MDICO:  Cualquier alergia que tenga.  Todos los Walt Disneymedicamentos que utiliza, incluidos vitaminas, hierbas, gotas oftlmicas, cremas y [address]icamentos de [address] libre.  Problemas previos que usted o los Graybar Electricmiembros de su familia hayan tenido con el uso de anestsicos.  Enfermedades de la sangre que padezca.  Cirugas previas.  Enfermedades que tenga. RIESGOS Y COMPLICACIONES En general, se trata de un procedimiento seguro. Sin embargo, Photographer, pueden surgir problemas. Algunos posibles problemas incluyen:   Dolor de pecho intenso.  Latidos cardacos rpidos.  Sensacin de calor en el pecho, que suele desaparecer  rpidamente. ANTES DEL PROCEDIMIENTO Consulte a su mdico si debe cambiar o suspender los medicamentos que toma habitualmente. PROCEDIMIENTO Griffin Dakin, este procedimiento se realiza en un hospital y [place] y Nurse, learning disability.  Se coloca una va intravenosa (IV) en una de las venas.  El mdico le inyectar una pequea cantidad de marcador radiactivo a travs de la va.  Luego, usted esperar durante 20 a mientras el marcador viaja por el torrente sanguneo.  Estar recostado sobre una camilla para que se puedan tomar imgenes del corazn. Se tomarn imgenes durante un lapso de 15 a .  Deber ejercitarse sobre una cinta caminadora o una bicicleta fija. Mientras se ejercita, se controlar la actividad del corazn con Artist (ECG) y se Management consultant presin arterial.  Si no puede ejercitarse, tal vez le administren un medicamento para acelerar los latidos cardacos.  Cuando el corazn reciba el flujo mximo de [place], se volver a Materials engineer en la va intravenosa.  Despus de entre 20 y , regresar a la camilla y se tomarn ms imgenes del Programmer, multimedia.  Cuando el procedimiento haya terminado, se retirar la va intravenosa. DESPUS DEL PROCEDIMIENTO  Es probable que pueda retirarse inmediatamente despus del estudio. Puede retomar su rutina normal, incluidos la dieta, las actividades y [employer], a menos que el mdico le indique lo contrario.  Averige cmo y [employer].   Esta informacin no tiene Theme park manager el consejo del mdico. Asegrese de hacerle al mdico cualquier pregunta que tenga.   Document Released: 09/10/2012 Document Revised: 05/21/2013 Elsevier Interactive Patient Education [employer]

## 2015-10-09 NOTE — Progress Notes (Signed)
Cardiology Office Note   Date:  10/09/2015   ID:  James Yates, DOB 1956/04/12, MRN [id number]  PCP: None Cardiologist:   Lorine Bears, MD   Chief Complaint  Patient presents with  . other    C/o chest pain/heaviness with right arm pain accompanied by sob ,headache and back upper pain/joint pain. Meds reviewed verbally with pt.      History of Present Illness: James Yates is a 60 y.o. male who presents for a followup visit regarding coronary artery disease status post CABG in February of 2015 after NSTEMI.  Other medical problems include hypertension, hyperlipidemia and bicuspid aortic valve with mild to moderate stenosis noted on intraoperative TEE. Most recent echocardiogram in February 2017 showed normal LV systolic function with mild to moderate aortic stenosis with a peak gradient of 24 mmHg. The patient was hospitalized in February with what seems to be a viral illness after he returned back from [place]. He was seen by Dr. Sampson Goon to check for other infectious etiologies. Testing for influenza was negative and his cultures were negative. He was significantly tachycardic on presentation with borderline elevated troponin. Echocardiogram showed normal LV systolic function.  He is now reporting intermittent episodes of substernal chest pain described as pressure both at rest and with physical activities. He continues to feel tired. The symptoms are associated with exertional dyspnea. His blood pressure has been elevated lately.   Past Medical History  Diagnosis Date  . Medical history non-contributory   . Glucose intolerance (impaired glucose tolerance)   . MI (myocardial infarction) (HCC)   . Obesity   . Bronchitis   . Coronary artery disease     Non-ST elevation myocardial infarction cardiac catheterization showed significant three-vessel coronary artery disease. status post CABG in February of 2015 with LIMA to LAD, SVG to right PDA and sequential SVG to OM 1 and  OM 3. Ejection fraction was 60%.  . Hyperlipidemia   . Hypertension   . Aortic valve disease     bicuspid aortic valve with mild stenosis    Past Surgical History  Procedure Laterality Date  . No past surgeries    . Coronary artery bypass graft N/A 07/05/2013    Procedure: CORONARY ARTERY BYPASS GRAFTING (CABG);  Surgeon: [name], MD;  Location: [facility] OR;  Service: Open Heart Surgery;  Laterality: N/A;  . Intraoperative transesophageal echocardiogram N/A 07/05/2013    Procedure: INTRAOPERATIVE TRANSESOPHAGEAL ECHOCARDIOGRAM;  Surgeon: [name], MD;  Location: [facility] OR;  Service: Open Heart Surgery;  Laterality: N/A;  . Cardiac catheterization  06/2013    [facility]     Current Outpatient Prescriptions  Medication Sig Dispense Refill  . aspirin EC 325 MG EC tablet Take 1 tablet (325 mg total) by mouth daily. 30 tablet 0  . atorvastatin (LIPITOR) 80 MG tablet TAKE 1 TABLET (80 MG TOTAL) BY MOUTH DAILY AT 6 PM. 90 tablet 3  . ibuprofen (ADVIL,MOTRIN) 100 MG tablet Take 200 mg by mouth every 6 (six) hours as needed for pain.     . metoprolol (LOPRESSOR) 100 MG tablet Take 1 tablet (100 mg total) by mouth 2 (two) times daily. 60 tablet 10   No current facility-administered medications for this visit.    Allergies:   Review of patient's allergies indicates no known allergies.    Social History:  The patient  reports that he has never smoked. He does not have any smokeless tobacco history on file. He reports that he does not drink alcohol  or use illicit drugs.   Family History:  The patient's family history includes Heart attack in his father; Heart disease in his father.    ROS:  Please see the history of present illness.   Otherwise, review of systems are positive for none.   All other systems are reviewed and negative.    PHYSICAL EXAM: VS:  BP 130/100 mmHg  Pulse 70  Ht 5\' 5"  (1.651 m)  Wt 201 lb 4 oz (91.286 kg)  BMI 33.49 kg/m2 , BMI Body mass index is 33.49 kg/(m^2). GEN:  Well nourished, well developed, in no acute distress HEENT: normal Neck: no JVD, carotid bruits, or masses Cardiac: RRR; no  rubs, or gallops,no edema . There is a 2/6 crescendo decrescendo aortic murmur which is mid peaking. Respiratory:  clear to auscultation bilaterally, normal work of breathing GI: soft, nontender, nondistended, + BS MS: no deformity or atrophy Skin: warm and dry, no rash Neuro:  Strength and sensation are intact Psych: euthymic mood, full affect   EKG:  EKG is ordered today. The ekg ordered today demonstrates normal sinus rhythm with right bundle branch block.   Recent Labs: 08/17/2015: Hemoglobin 15.6 09/21/2015: ALT 20; BUN 10; Creatinine, Ser 0.80; Platelets 202; Potassium 3.7; Sodium 139    Lipid Panel    Component Value Date/Time   CHOL 159 09/21/2015 1457   CHOL 215* 07/02/2013 0058   TRIG 208* 09/21/2015 1457   TRIG 143 07/02/2013 0058   HDL 39* 09/21/2015 1457   HDL 38* 07/02/2013 0058   CHOLHDL 4.1 09/21/2015 1457   VLDL 29 07/02/2013 0058   LDLCALC 78 09/21/2015 1457   LDLCALC 148* 07/02/2013 0058      Wt Readings from Last 3 Encounters:  10/09/15 201 lb 4 oz (91.286 kg)  09/28/15 201 lb (91.173 kg)  09/21/15 198 lb (89.812 kg)        ASSESSMENT AND PLAN:  1.  Coronary artery disease involving native coronary arteries:  The patient is now complaining of chest pain both at rest and with physical activities. Somewhat atypical for angina but he had recent troponin elevation in the setting of viral illness and significant tachycardia. He has known history of coronary artery disease. Thus, I requested a treadmill nuclear stress test.  2. Bicuspid aortic valve with mild to moderate stenosis. This was checked in February in 2017 and was stable.   3. Essential hypertension: Blood pressure has been elevated lately especially his diastolic blood pressure. This might be contributing to some of his symptoms and thus I added amlodipine.  4.  Hyperlipidemia: Continue high dose atorvastatin. Recent lipid profile showed an LDL of 78.   Disposition:   FU with me in 3 months  Signed,  Lorine BearsMuhammad Sofija Antwi, MD  10/09/2015 3:21 PM    [facility]

## 2015-10-19 ENCOUNTER — Ambulatory Visit
Admission: RE | Admit: 2015-10-19 | Discharge: 2015-10-19 | Disposition: A | Discharge status: Home / Self Care | Payer: BLUE CROSS/BLUE SHIELD | Source: Ambulatory Visit | Attending: Cardiovascular Disease | Admitting: Cardiovascular Disease

## 2015-10-19 DIAGNOSIS — I510 Cardiac septal defect, acquired: Secondary | ICD-10-CM | POA: Insufficient documentation

## 2015-10-19 DIAGNOSIS — R079 Chest pain, unspecified: Secondary | ICD-10-CM | POA: Insufficient documentation

## 2015-10-19 DIAGNOSIS — I5189 Other ill-defined heart diseases: Secondary | ICD-10-CM | POA: Diagnosis not present

## 2015-10-19 LAB — NM MYOCAR MULTI W/SPECT W/WALL MOTION / EF
CHL CUP NUCLEAR SSS: 19
LV dias vol: 94 mL (ref 62–150)
LVSYSVOL: 30 mL
NUC STRESS TID: 1.15
Peak HR: 97 {beats}/min
Percent HR: 60 %
Rest HR: 77 {beats}/min
SDS: 6
SRS: 13

## 2015-10-19 MED ORDER — REGADENOSON 0.4 MG/5ML IV SOLN
0.4000 mg | Freq: Once | INTRAVENOUS | Status: AC
  Administered 2015-10-19: 0.4 mg via INTRAVENOUS

## 2015-10-19 MED ORDER — TECHNETIUM TC 99M TETROFOSMIN IV KIT
30.0000 | PACK | Freq: Once | INTRAVENOUS | Status: AC | PRN
  Administered 2015-10-19: 31.51 via INTRAVENOUS

## 2015-10-19 MED ORDER — TECHNETIUM TC 99M TETROFOSMIN IV KIT
13.0000 | PACK | Freq: Once | INTRAVENOUS | Status: AC | PRN
  Administered 2015-10-19: 12.64 via INTRAVENOUS

## 2015-10-20 ENCOUNTER — Encounter: Payer: Self-pay | Admitting: Cardiovascular Disease

## 2015-10-20 ENCOUNTER — Other Ambulatory Visit: Payer: Self-pay

## 2015-10-20 DIAGNOSIS — Z01812 Encounter for preprocedural laboratory examination: Secondary | ICD-10-CM

## 2015-10-20 MED ORDER — CLOPIDOGREL BISULFATE 75 MG PO TABS: 75.0000 mg | ORAL_TABLET | Freq: Every day | ORAL | Status: DC

## 2015-10-20 MED ORDER — ASPIRIN EC 81 MG PO TBEC: 81.0000 mg | DELAYED_RELEASE_TABLET | Freq: Every day | ORAL | Status: DC

## 2015-10-20 NOTE — Patient Instructions (Signed)
Your physician has requested that you have a cardiac catheterization. Cardiac catheterization is used to diagnose and/or treat various heart conditions. Doctors may recommend this procedure for a number of different reasons. The most common reason is to evaluate chest pain. Chest pain can be a symptom of coronary artery disease (CAD), and cardiac catheterization can show whether plaque is narrowing or blocking your heart's arteries. This procedure is also used to evaluate the valves, as well as measure the blood flow and oxygen levels in different parts of your heart. For further information please visit [email or web address] Please follow instruction sheet, as given.  [facility] Cardiac Cath Instructions   You are scheduled for a Cardiac Cath on: Thursday, June 1  Please arrive at 8:30am on the day of your procedure  Please expect a call from our [facility] to pre-register you  Do not eat/drink anything after midnight  Someone will need to drive you home  It is recommended someone be with you for the first 24 hours after your procedure  Wear clothes that are easy to get on/off and wear slip on shoes if possible   Medications bring a current list of all medications with you  __xx_ You may take all of your medications the morning of your procedure with enough water to swallow safely  PLEASE HAVE LABS DRAWN AT [facility] BY MAY 30. You do not need an appointment. Check in a Registration/Admitting desk which is located in the Medical Mall. They will direct you to the [facility] lab.    Day of your procedure: [facility] at the [facility] entrance.  Free valet service is available.  After entering the Medical Mall please check-in at the registration desk (1st desk on your right) to receive your armband. After receiving your armband someone will escort you to the cardiac cath/special procedures waiting area.  The usual length of stay after your procedure is about 2 to 3 hours.  This can  vary.  If you have any questions, please call our office at [phone], or you may call the cardiac cath lab at [facility] directly at [phone] Angiogram An angiogram, also called angiography, is a procedure used to look at the blood vessels. In this procedure, dye is injected through a long, thin tube (catheter) into an artery. X-rays are then taken. The X-rays will show if there is a blockage or problem in a blood vessel.  LET [facility]ER KNOW ABOUT:  Any allergies you have, including allergies to shellfish or contrast dye.   All medicines you are taking, including vitamins, herbs, eye drops, creams, and over-the-counter medicines.   Previous problems you or members of your family have had with the use of anesthetics.   Any blood disorders you have.   Previous surgeries you have had.  Any previous kidney problems or failure you have had.  Medical conditions you have.   Possibility of pregnancy, if this applies. RISKS AND COMPLICATIONS Generally, an angiogram is a safe procedure. However, as with any procedure, problems can occur. Possible problems include:  Injury to the blood vessels, including rupture or bleeding.  Infection or bruising at the catheter site.  Allergic reaction to the dye or contrast used.  Kidney damage from the dye or contrast used.  Blood clots that can lead to a stroke or heart attack. BEFORE THE PROCEDURE  Do not eat or drink after midnight on the night before the procedure, or as directed by your health care provider.   Ask your health  care provider if you may drink enough water to take any needed medicines the morning of the procedure.  PROCEDURE  You may be given a medicine to help you relax (sedative) before and during the procedure. This medicine is given through an IV access tube that is inserted into one of your veins.   The area where the catheter will be inserted will be washed and shaved. This is usually done in the  groin but may be done in the fold of your arm (near your elbow) or in the wrist.  A medicine will be given to numb the area where the catheter will be inserted (local anesthetic).  The catheter will be inserted with a guide wire into an artery. The catheter is guided by using a type of X-ray (fluoroscopy) to the blood vessel being examined.   Dye is then injected into the catheter, and X-rays are taken. The dye helps to show where any narrowing or blockages are located.  AFTER THE PROCEDURE   If the procedure is done through the leg, you will be kept in bed lying flat for several hours. You will be instructed to not bend or cross your legs.  The insertion site will be checked frequently.  The pulse in your feet or wrist will be checked frequently.  Additional blood tests, X-rays, and electrocardiography may be done.   You may need to stay in the hospital overnight for observation.    This information is not intended to replace advice given to you by your health care provider. Make sure you discuss any questions you have with your health care provider.   Document Released: 02/23/2005 Document Revised: 06/06/2014 Document Reviewed: 10/17/2012 Elsevier Interactive Patient Education 2016 Elsevier Inc. Angiogram, Care After Refer to this sheet in the next few weeks. These instructions provide you with information about caring for yourself after your procedure. Your health care provider may also give you more specific instructions. Your treatment has been planned according to current medical practices, but problems sometimes occur. Call your health care provider if you have any problems or questions after your procedure. WHAT TO EXPECT AFTER THE PROCEDURE After your procedure, it is typical to have the following:  Bruising at the catheter insertion site that usually fades within 1-2 weeks.  Blood collecting in the tissue (hematoma) that may be painful to the touch. It should usually  decrease in size and tenderness within 1-2 weeks. HOME CARE INSTRUCTIONS  Take medicines only as directed by your health care provider.  You may shower 24-48 hours after the procedure or as directed by your health care provider. Remove the bandage (dressing) and gently wash the site with plain soap and water. Pat the area dry with a clean towel. Do not rub the site, because this may cause bleeding.  Do not take baths, swim, or use a hot tub until your health care provider approves.  Check your insertion site every day for redness, swelling, or drainage.  Do not apply powder or lotion to the site.  Do not lift over 10 lb (4.5 kg) for 5 days after your procedure or as directed by your health care provider.  Ask your health care provider when it is okay to:  Return to work or school.  Resume usual physical activities or sports.  Resume sexual activity.  Do not drive home if you are discharged the same day as the procedure. Have someone else drive you.  You may drive 24 hours after the procedure unless otherwise  instructed by your health care provider.  Do not operate machinery or power tools for 24 hours after the procedure or as directed by your health care provider.  If your procedure was done as an outpatient procedure, which means that you went home the same day as your procedure, a responsible adult should be with you for the first 24 hours after you arrive home.  Keep all follow-up visits as directed by your health care provider. This is important. SEEK MEDICAL CARE IF:  You have a fever.  You have chills.  You have increased bleeding from the catheter insertion site. Hold pressure on the site. SEEK IMMEDIATE MEDICAL CARE IF:  You have unusual pain at the catheter insertion site.  You have redness, warmth, or swelling at the catheter insertion site.  You have drainage (other than a small amount of blood on the dressing) from the catheter insertion site.  The catheter  insertion site is bleeding, and the bleeding does not stop after 30 minutes of holding steady pressure on the site.  The area near or just beyond the catheter insertion site becomes pale, cool, tingly, or numb.   This information is not intended to replace advice given to you by your health care provider. Make sure you discuss any questions you have with your health care provider.   Document Released: 12/02/2004 Document Revised: 06/06/2014 Document Reviewed: 10/17/2012 Elsevier Interactive Patient Education [employer]

## 2015-10-27 ENCOUNTER — Other Ambulatory Visit
Admission: RE | Admit: 2015-10-27 | Discharge: 2015-10-27 | Disposition: A | Discharge status: Home / Self Care | Payer: BLUE CROSS/BLUE SHIELD | Source: Ambulatory Visit | Attending: Cardiovascular Disease | Admitting: Cardiovascular Disease

## 2015-10-27 ENCOUNTER — Telehealth: Payer: Self-pay | Admitting: Cardiovascular Disease

## 2015-10-27 DIAGNOSIS — E785 Hyperlipidemia, unspecified: Secondary | ICD-10-CM | POA: Insufficient documentation

## 2015-10-27 DIAGNOSIS — I2510 Atherosclerotic heart disease of native coronary artery without angina pectoris: Secondary | ICD-10-CM | POA: Diagnosis not present

## 2015-10-27 DIAGNOSIS — Z01812 Encounter for preprocedural laboratory examination: Secondary | ICD-10-CM | POA: Diagnosis not present

## 2015-10-27 DIAGNOSIS — I10 Essential (primary) hypertension: Secondary | ICD-10-CM | POA: Diagnosis not present

## 2015-10-27 LAB — CBC WITH DIFFERENTIAL/PLATELET
BASOS ABS: 0 10*3/uL (ref 0–0.1)
BASOS PCT: 0 %
EOS PCT: 3 %
Eosinophils Absolute: 0.2 10*3/uL (ref 0–0.7)
HEMATOCRIT: 44.4 % (ref 40.0–52.0)
Hemoglobin: 15.1 g/dL (ref 13.0–18.0)
Lymphocytes Relative: 23 %
Lymphs Abs: 1.4 10*3/uL (ref 1.0–3.6)
MCH: 29.3 pg (ref 26.0–34.0)
MCHC: 34 g/dL (ref 32.0–36.0)
MCV: 86.3 fL (ref 80.0–100.0)
MONO ABS: 0.5 10*3/uL (ref 0.2–1.0)
Monocytes Relative: 8 %
NEUTROS ABS: 4.1 10*3/uL (ref 1.4–6.5)
Neutrophils Relative %: 66 %
PLATELETS: 167 10*3/uL (ref 150–440)
RBC: 5.14 MIL/uL (ref 4.40–5.90)
RDW: 13.5 % (ref 11.5–14.5)
WBC: 6.2 10*3/uL (ref 3.8–10.6)

## 2015-10-27 LAB — PROTIME-INR
INR: 0.95
Prothrombin Time: 12.9 seconds (ref 11.4–15.0)

## 2015-10-27 LAB — BASIC METABOLIC PANEL
Anion gap: 9 (ref 5–15)
BUN: 17 mg/dL (ref 6–20)
CALCIUM: 8.6 mg/dL — AB (ref 8.9–10.3)
CO2: 25 mmol/L (ref 22–32)
Chloride: 103 mmol/L (ref 101–111)
Creatinine, Ser: 0.95 mg/dL (ref 0.61–1.24)
GFR calc Af Amer: >60 mL/min (ref >60)
GLUCOSE: 136 mg/dL — AB (ref 65–99)
POTASSIUM: 3.7 mmol/L (ref 3.5–5.1)
SODIUM: 137 mmol/L (ref 135–145)

## 2015-10-27 NOTE — Telephone Encounter (Signed)
Pt scheduled for heart cath 6/1. S/w daughter, Burman FosterReyna, on May 23 w/abnormal lexi results and need for cath. Documentation and lab orders left at front desk for pick up. Information has not been picked up, labs not yet drawn. Attempted to contact Lebanoneyna. Left message on cell VM to call back.

## 2015-10-28 NOTE — Telephone Encounter (Signed)
[facility] on May 30. Cath scheduled June 1, 9:30am Left detailed message on pt/pt daughter's cell phone regarding date, time, location and pre-procedure instructions along w/CB number if questions.

## 2015-10-29 ENCOUNTER — Ambulatory Visit
Admission: RE | Admit: 2015-10-29 | Discharge: 2015-10-29 | Disposition: A | Discharge status: Home / Self Care | Payer: BLUE CROSS/BLUE SHIELD | Source: Ambulatory Visit | Attending: Cardiovascular Disease | Admitting: Cardiovascular Disease

## 2015-10-29 ENCOUNTER — Encounter
Admission: RE | Disposition: A | Discharge status: Home / Self Care | Payer: Self-pay | Source: Ambulatory Visit | Attending: Cardiovascular Disease

## 2015-10-29 DIAGNOSIS — E7439 Other disorders of intestinal carbohydrate absorption: Secondary | ICD-10-CM | POA: Insufficient documentation

## 2015-10-29 DIAGNOSIS — Z951 Presence of aortocoronary bypass graft: Secondary | ICD-10-CM | POA: Insufficient documentation

## 2015-10-29 DIAGNOSIS — Z6833 Body mass index (BMI) 33.0-33.9, adult: Secondary | ICD-10-CM | POA: Diagnosis not present

## 2015-10-29 DIAGNOSIS — I10 Essential (primary) hypertension: Secondary | ICD-10-CM | POA: Diagnosis not present

## 2015-10-29 DIAGNOSIS — I25118 Atherosclerotic heart disease of native coronary artery with other forms of angina pectoris: Secondary | ICD-10-CM

## 2015-10-29 DIAGNOSIS — I350 Nonrheumatic aortic (valve) stenosis: Secondary | ICD-10-CM | POA: Diagnosis not present

## 2015-10-29 DIAGNOSIS — E669 Obesity, unspecified: Secondary | ICD-10-CM | POA: Insufficient documentation

## 2015-10-29 DIAGNOSIS — I252 Old myocardial infarction: Secondary | ICD-10-CM | POA: Diagnosis not present

## 2015-10-29 DIAGNOSIS — E785 Hyperlipidemia, unspecified: Secondary | ICD-10-CM | POA: Diagnosis not present

## 2015-10-29 DIAGNOSIS — I2510 Atherosclerotic heart disease of native coronary artery without angina pectoris: Secondary | ICD-10-CM | POA: Insufficient documentation

## 2015-10-29 DIAGNOSIS — I2089 Other forms of angina pectoris: Secondary | ICD-10-CM | POA: Insufficient documentation

## 2015-10-29 DIAGNOSIS — Z7982 Long term (current) use of aspirin: Secondary | ICD-10-CM | POA: Diagnosis not present

## 2015-10-29 DIAGNOSIS — Q231 Congenital insufficiency of aortic valve: Secondary | ICD-10-CM | POA: Insufficient documentation

## 2015-10-29 DIAGNOSIS — Z79899 Other long term (current) drug therapy: Secondary | ICD-10-CM | POA: Diagnosis not present

## 2015-10-29 DIAGNOSIS — I25810 Atherosclerosis of coronary artery bypass graft(s) without angina pectoris: Secondary | ICD-10-CM | POA: Diagnosis not present

## 2015-10-29 DIAGNOSIS — I208 Other forms of angina pectoris: Secondary | ICD-10-CM | POA: Insufficient documentation

## 2015-10-29 DIAGNOSIS — R079 Chest pain, unspecified: Secondary | ICD-10-CM | POA: Diagnosis present

## 2015-10-29 HISTORY — PX: CARDIAC CATHETERIZATION: SHX172

## 2015-10-29 SURGERY — LEFT HEART CATH AND CORS/GRAFTS ANGIOGRAPHY

## 2015-10-29 MED ORDER — SODIUM CHLORIDE 0.9% FLUSH: 3.0000 mL | INTRAVENOUS | Status: DC | PRN

## 2015-10-29 MED ORDER — IOPAMIDOL (ISOVUE-300) INJECTION 61%
INTRAVENOUS | Status: DC | PRN
  Administered 2015-10-29: 150 mL via INTRAVENOUS

## 2015-10-29 MED ORDER — MIDAZOLAM HCL 2 MG/2ML IJ SOLN
INTRAMUSCULAR | Status: AC
  Filled 2015-10-29: qty 2

## 2015-10-29 MED ORDER — SODIUM CHLORIDE 0.9 % IV SOLN: 250.0000 mL | INTRAVENOUS | Status: DC | PRN

## 2015-10-29 MED ORDER — HEPARIN (PORCINE) IN NACL 2-0.9 UNIT/ML-% IJ SOLN
INTRAMUSCULAR | Status: AC
  Filled 2015-10-29: qty 500

## 2015-10-29 MED ORDER — FENTANYL CITRATE (PF) 100 MCG/2ML IJ SOLN
INTRAMUSCULAR | Status: AC
  Filled 2015-10-29: qty 2

## 2015-10-29 MED ORDER — HEPARIN SODIUM (PORCINE) 1000 UNIT/ML IJ SOLN
INTRAMUSCULAR | Status: AC
  Filled 2015-10-29: qty 1

## 2015-10-29 MED ORDER — FENTANYL CITRATE (PF) 100 MCG/2ML IJ SOLN
INTRAMUSCULAR | Status: DC | PRN
  Administered 2015-10-29: 25 ug via INTRAVENOUS

## 2015-10-29 MED ORDER — VERAPAMIL HCL 2.5 MG/ML IV SOLN
INTRAVENOUS | Status: AC
  Filled 2015-10-29: qty 2

## 2015-10-29 MED ORDER — VERAPAMIL HCL 2.5 MG/ML IV SOLN
INTRAVENOUS | Status: DC | PRN
  Administered 2015-10-29: 2.5 mg via INTRA_ARTERIAL

## 2015-10-29 MED ORDER — MIDAZOLAM HCL 2 MG/2ML IJ SOLN
INTRAMUSCULAR | Status: DC | PRN
Start: 2015-10-29 — End: 2015-10-29
  Administered 2015-10-29: 1 mg via INTRAVENOUS

## 2015-10-29 MED ORDER — SODIUM CHLORIDE 0.9 % IV SOLN
INTRAVENOUS | Status: DC
Start: 2015-10-29 — End: 2015-10-29
  Administered 2015-10-29: 09:00:00 via INTRAVENOUS

## 2015-10-29 MED ORDER — ASPIRIN 81 MG PO CHEW: 81.0000 mg | CHEWABLE_TABLET | ORAL | Status: DC

## 2015-10-29 SURGICAL SUPPLY — 11 items
CATH 5F 110X4 TIG (CATHETERS) ×3 IMPLANT
CATH INFINITI 5 FR IM (CATHETERS) ×3 IMPLANT
CATH INFINITI 5 FR MPA2 (CATHETERS) ×3 IMPLANT
CATH INFINITI 5FR AL1 (CATHETERS) ×3 IMPLANT
CATH INFINITI JR4 5F (CATHETERS) ×3 IMPLANT
DEVICE RAD TR BAND REGULAR (VASCULAR PRODUCTS) ×3 IMPLANT
GLIDESHEATH SLEND SS 6F .021 (SHEATH) ×3 IMPLANT
KIT MANI 3VAL PERCEP (MISCELLANEOUS) ×3 IMPLANT
PACK CARDIAC CATH (CUSTOM PROCEDURE TRAY) ×3 IMPLANT
SYR CONTROL 10ML ANGIOGRAPHIC (SYRINGE) ×3 IMPLANT
WIRE SAFE-T 1.5MM-J .035X260CM (WIRE) ×3 IMPLANT

## 2015-10-29 NOTE — Discharge Instructions (Signed)

## 2015-10-29 NOTE — Interval H&P Note (Signed)
Cath Lab Visit (complete for each Cath Lab visit)  Clinical Evaluation Leading to the Procedure:   ACS: No.  Non-ACS:    Anginal Classification: CCS III  Anti-ischemic medical therapy: Maximal Therapy (2 or more classes of medications)  Non-Invasive Test Results: High-risk stress test findings: cardiac mortality >3%/year  Prior CABG: Previous CABG      History and Physical Interval Note:  10/29/2015 10:44 AM  James Yates  has presented today for surgery, with the diagnosis of Abnormal stress test  The various methods of treatment have been discussed with the patient and family. After consideration of risks, benefits and other options for treatment, the patient has consented to  Procedure(s): Left Heart Cath and Coronary Angiography (Left) as a surgical intervention .  The patient's history has been reviewed, patient examined, no change in status, stable for surgery.  I have reviewed the patient's chart and labs.  Questions were answered to the patient's satisfaction.     Lorine BearsMuhammad Arida

## 2015-10-29 NOTE — H&P (View-Only) (Signed)
Cardiology Office Note   Date:  10/09/2015   ID:  James Yates, DOB 11/15/1955, MRN [id number]  PCP: None Cardiologist:   Lorine BearsMuhammad Arida, MD   Chief Complaint  Patient presents with  . other    C/o chest pain/heaviness with right arm pain accompanied by sob ,headache and back upper pain/joint pain. Meds reviewed verbally with pt.      History of Present Illness: James Yates is a 60 y.o. male who presents for a followup visit regarding coronary artery disease status post CABG in February of 2015 after NSTEMI.  Other medical problems include hypertension, hyperlipidemia and bicuspid aortic valve with mild to moderate stenosis noted on intraoperative TEE. Most recent echocardiogram in February 2017 showed normal LV systolic function with mild to moderate aortic stenosis with a peak gradient of 24 mmHg. The patient was hospitalized in February with what seems to be a viral illness after he returned back from [place]. He was seen by Dr. Sampson GoonFitzgerald to check for other infectious etiologies. Testing for influenza was negative and his cultures were negative. He was significantly tachycardic on presentation with borderline elevated troponin. Echocardiogram showed normal LV systolic function.  He is now reporting intermittent episodes of substernal chest pain described as pressure both at rest and with physical activities. He continues to feel tired. The symptoms are associated with exertional dyspnea. His blood pressure has been elevated lately.   Past Medical History  Diagnosis Date  . Medical history non-contributory   . Glucose intolerance (impaired glucose tolerance)   . MI (myocardial infarction) (HCC)   . Obesity   . Bronchitis   . Coronary artery disease     Non-ST elevation myocardial infarction cardiac catheterization showed significant three-vessel coronary artery disease. status post CABG in February of 2015 with LIMA to LAD, SVG to right PDA and sequential SVG to OM 1 and  OM 3. Ejection fraction was 60%.  . Hyperlipidemia   . Hypertension   . Aortic valve disease     bicuspid aortic valve with mild stenosis    Past Surgical History  Procedure Laterality Date  . No past surgeries    . Coronary artery bypass graft N/A 07/05/2013    Procedure: CORONARY ARTERY BYPASS GRAFTING (CABG);  Surgeon: Kerin PernaPeter Van Trigt, MD;  [facility];  Service: Open Heart Surgery;  Laterality: N/A;  . Intraoperative transesophageal echocardiogram N/A 07/05/2013    Procedure: INTRAOPERATIVE TRANSESOPHAGEAL ECHOCARDIOGRAM;  Surgeon: Kerin PernaPeter Van Trigt, MD;  [facility];  Service: Open Heart Surgery;  Laterality: N/A;  . Cardiac catheterization  06/2013    [facility]     Current Outpatient Prescriptions  Medication Sig Dispense Refill  . aspirin EC 325 MG EC tablet Take 1 tablet (325 mg total) by mouth daily. 30 tablet 0  . atorvastatin (LIPITOR) 80 MG tablet TAKE 1 TABLET (80 MG TOTAL) BY MOUTH DAILY AT 6 PM. 90 tablet 3  . ibuprofen (ADVIL,MOTRIN) 100 MG tablet Take 200 mg by mouth every 6 (six) hours as needed for pain.     . metoprolol (LOPRESSOR) 100 MG tablet Take 1 tablet (100 mg total) by mouth 2 (two) times daily. 60 tablet 10   No current facility-administered medications for this visit.    Allergies:   Review of patient's allergies indicates no known allergies.    Social History:  The patient  reports that he has never smoked. He does not have any smokeless tobacco history on file. He reports that he does not drink alcohol  or use illicit drugs.   Family History:  The patient's family history includes Heart attack in his father; Heart disease in his father.    ROS:  Please see the history of present illness.   Otherwise, review of systems are positive for none.   All other systems are reviewed and negative.    PHYSICAL EXAM: VS:  BP 130/100 mmHg  Pulse 70  Ht  (1.651 m)  Wt 201 lb 4 oz (91.286 kg)  BMI 33.49 kg/m2 , BMI Body mass index is 33.49 kg/(m^2). GEN:  Well nourished, well developed, in no acute distress HEENT: normal Neck: no JVD, carotid bruits, or masses Cardiac: RRR; no  rubs, or gallops,no edema . There is a 2/6 crescendo decrescendo aortic murmur which is mid peaking. Respiratory:  clear to auscultation bilaterally, normal work of breathing GI: soft, nontender, nondistended, + BS MS: no deformity or atrophy Skin: warm and dry, no rash Neuro:  Strength and sensation are intact Psych: euthymic mood, full affect   EKG:  EKG is ordered today. The ekg ordered today demonstrates normal sinus rhythm with right bundle branch block.   Recent Labs: 08/17/2015: Hemoglobin 15.6 09/21/2015: ALT 20; BUN 10; Creatinine, Ser 0.80; Platelets 202; Potassium 3.7; Sodium 139    Lipid Panel    Component Value Date/Time   CHOL 159 09/21/2015 1457   CHOL 215* 07/02/2013 0058   TRIG 208* 09/21/2015 1457   TRIG 143 07/02/2013 0058   HDL 39* 09/21/2015 1457   HDL 38* 07/02/2013 0058   CHOLHDL 4.1 09/21/2015 1457   VLDL 29 07/02/2013 0058   LDLCALC 78 09/21/2015 1457   LDLCALC 148* 07/02/2013 0058      Wt Readings from Last 3 Encounters:  10/09/15 201 lb 4 oz (91.286 kg)  09/28/15 201 lb (91.173 kg)  09/21/15 198 lb (89.812 kg)        ASSESSMENT AND PLAN:  1.  Coronary artery disease involving native coronary arteries:  The patient is now complaining of chest pain both at rest and with physical activities. Somewhat atypical for angina but he had recent troponin elevation in the setting of viral illness and significant tachycardia. He has known history of coronary artery disease. Thus, I requested a treadmill nuclear stress test.  2. Bicuspid aortic valve with mild to moderate stenosis. This was checked in February in 2017 and was stable.   3. Essential hypertension: Blood pressure has been elevated lately especially his diastolic blood pressure. This might be contributing to some of his symptoms and thus I added amlodipine.  4.  Hyperlipidemia: Continue high dose atorvastatin. Recent lipid profile showed an LDL of 78.   Disposition:   FU with me in 3 months  Signed,  Lorine Bears, MD  10/09/2015 3:21 PM    [facility]

## 2015-10-30 ENCOUNTER — Encounter: Payer: Self-pay | Admitting: Cardiovascular Disease

## 2015-11-09 ENCOUNTER — Encounter: Payer: Self-pay | Admitting: Cardiovascular Disease

## 2015-11-09 ENCOUNTER — Ambulatory Visit (INDEPENDENT_AMBULATORY_CARE_PROVIDER_SITE_OTHER): Payer: BLUE CROSS/BLUE SHIELD | Admitting: Cardiovascular Disease

## 2015-11-09 VITALS — BP 120/90 | HR 86 | Ht 64.0 in | Wt 200.5 lb

## 2015-11-09 DIAGNOSIS — R0789 Other chest pain: Secondary | ICD-10-CM | POA: Diagnosis not present

## 2015-11-09 MED ORDER — PANTOPRAZOLE SODIUM 40 MG PO TBEC: 40.0000 mg | DELAYED_RELEASE_TABLET | Freq: Every day | ORAL | Status: DC

## 2015-11-09 NOTE — Progress Notes (Signed)
Cardiology Office Note   Date:  11/09/2015   ID:  James Yates, DOB 07/08/1955, MRN [id number]  PCP: None Cardiologist:   Lorine Bears, MD   Chief Complaint  Patient presents with  . other    Follow up from Cardiac Cath. Pt. c/o occas. chest discomfort & feels nauseated in the afternoon.  Meds reviewed by the patient verbally.       History of Present Illness: James Yates is a 60 y.o. male who presents for a followup visit regarding coronary artery disease status post CABG in February of 2015 after NSTEMI.  Other medical problems include hypertension, hyperlipidemia and bicuspid aortic valve with mild to moderate stenosis noted on intraoperative TEE. Most recent echocardiogram in February 2017 showed normal LV systolic function with mild to moderate aortic stenosis with a peak gradient of 24 mmHg. The patient was hospitalized in February with what seems to be a viral illness after he returned back from [place]. He was seen by Dr. Sampson Goon to check for other infectious etiologies. Testing for influenza was negative and his cultures were negative. He was significantly tachycardic on presentation with borderline elevated troponin. Echocardiogram showed normal LV systolic function.  He was seen recently for worsening anginal symptoms. He underwent a nuclear stress test which showed large area of ischemia in the left circumflex distribution with borderline reduced LV systolic function. The study was high risk. I proceeded with cardiac catheterization via the left radial artery which showed severe underlying three-vessel coronary artery disease with patent LIMA to LAD and SVG to RCA. The SVG to the left circumflex was occluded. Native left circumflex was occluded proximally with faint collaterals. Aortic stenosis was moderate with peak to peak gradient of 22 mmHg . Unfortunately, the patient continues to have significant exertional dyspnea and chest pain described as burning sensation.  This is currently happening with his regular activities and has prevented him from being able to go back to work. He has been taking his medications regularly. He has been complaining of increased heartburn since she was started on Plavix.  Past Medical History  Diagnosis Date  . Medical history non-contributory   . Glucose intolerance (impaired glucose tolerance)   . MI (myocardial infarction) (HCC)   . Obesity   . Bronchitis   . Coronary artery disease     Non-ST elevation myocardial infarction cardiac catheterization showed significant three-vessel coronary artery disease. 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 Ejection fraction was 60%.  . Hyperlipidemia   . Hypertension   . Aortic valve disease     bicuspid aortic valve with mild stenosis    Past Surgical History  Procedure Laterality Date  . No past surgeries    . Coronary artery bypass graft N/A 07/05/2013    Procedure: CORONARY ARTERY BYPASS GRAFTING (CABG);  Surgeon: [name], MD;  Location: [facility] OR;  Service: Open Heart Surgery;  Laterality: N/A;  . Intraoperative transesophageal echocardiogram N/A 07/05/2013    Procedure: INTRAOPERATIVE TRANSESOPHAGEAL ECHOCARDIOGRAM;  Surgeon: [name], MD;  Location: [facility] OR;  Service: Open Heart Surgery;  Laterality: N/A;  . Cardiac catheterization  06/2013    [facility]  . Cardiac catheterization N/A 10/29/2015    Procedure: Left Heart Cath and Cors/Grafts Angiography;  Surgeon: Iran Ouch, MD;  Location: [facility] INVASIVE CV LAB;  Service: Cardiovascular;  Laterality: N/A;     Current Outpatient Prescriptions  Medication  Sig Dispense Refill  . amLODipine (NORVASC) 5 MG tablet Take 1 tablet (5 mg total) by mouth daily. 30 tablet 3  . aspirin EC 81 MG tablet Take 1 tablet (81 mg total) by mouth daily. 30 tablet 3  . atorvastatin (LIPITOR) 80 MG tablet TAKE 1 TABLET (80 MG TOTAL) BY MOUTH DAILY AT 6 PM. (Patient  taking differently: Take 80 mg by mouth 2 (two) times daily. TAKE 1 TABLET (80 MG TOTAL) BY MOUTH DAILY AT 6 PM.) 90 tablet 3  . clopidogrel (PLAVIX) 75 MG tablet Take 1 tablet (75 mg total) by mouth daily. 30 tablet 5  . ibuprofen (ADVIL,MOTRIN) 100 MG tablet Take 200 mg by mouth every 6 (six) hours as needed for pain.     . metoprolol (LOPRESSOR) 100 MG tablet Take 1 tablet (100 mg total) by mouth 2 (two) times daily. (Patient taking differently: Take 100 mg by mouth every morning. ) 60 tablet 10   No current facility-administered medications for this visit.    Allergies:   Review of patient's allergies indicates no known allergies.    Social History:  The patient  reports that he has never smoked. He does not have any smokeless tobacco history on file. He reports that he does not drink alcohol or use illicit drugs.   Family History:  The patient's family history includes Heart attack in his father; Heart disease in his father.    ROS:  Please see the history of present illness.   Otherwise, review of systems are positive for none.   All other systems are reviewed and negative.    PHYSICAL EXAM: VS:  BP 120/90 mmHg  Pulse 86  Ht  (1.626 m)  Wt 200 lb 8 oz (90.946 kg)  BMI 34.40 kg/m2 , BMI Body mass index is 34.4 kg/(m^2). GEN: Well nourished, well developed, in no acute distress HEENT: normal Neck: no JVD, carotid bruits, or masses Cardiac: RRR; no  rubs, or gallops,no edema . There is a 2/6 crescendo decrescendo aortic murmur which is mid peaking. Respiratory:  clear to auscultation bilaterally, normal work of breathing GI: soft, nontender, nondistended, + BS MS: no deformity or atrophy Skin: warm and dry, no rash Neuro:  Strength and sensation are intact Psych: euthymic mood, full affect   EKG:  EKG is ordered today. The ekg ordered today demonstrates normal sinus rhythm with right bundle branch block.   Recent Labs: 09/21/2015: ALT 20 10/27/2015: BUN 17;  Creatinine, Ser 0.95; Hemoglobin 15.1; Platelets 167; Potassium 3.7; Sodium 137    Lipid Panel    Component Value Date/Time   CHOL 159 09/21/2015 1457   CHOL 215* 07/02/2013 0058   TRIG 208* 09/21/2015 1457   TRIG 143 07/02/2013 0058   HDL 39* 09/21/2015 1457   HDL 38* 07/02/2013 0058   CHOLHDL 4.1 09/21/2015 1457   VLDL 29 07/02/2013 0058   LDLCALC 78 09/21/2015 1457   LDLCALC 148* 07/02/2013 0058      Wt Readings from Last 3 Encounters:  11/09/15 200 lb 8 oz (90.946 kg)  10/29/15 201 lb (91.173 kg)  10/09/15 201 lb 4 oz (91.286 kg)        ASSESSMENT AND PLAN:  1.  Coronary artery disease involving native coronary arteries:  The patient continues to have class III angina in spite of maximal antianginal therapy including metoprolol and amlodipine. His recent nuclear stress test was high risk and showed large area of ischemia in the left circumflex distribution. Cardiac catheterization showed chronic  total occlusion of the left circumflex. The SVG to left circumflex was occluded. I am going to discuss with our CTO team to see if he is a candidate for PCI. Treatment with the Ranexa can be considered.  2. Bicuspid aortic valve with  moderate stenosis. Continue to monitor.   3. Essential hypertension: Blood pressure improved after the addition of amlodipine   4. Hyperlipidemia: Continue high dose atorvastatin. Recent lipid profile showed an LDL of 78.  5. GERD: I added Protonix 40 mg once daily.  Disposition:   FU with me in 1 months  Signed,  Lorine BearsMuhammad Arida, MD  11/09/2015 8:59 AM    [facility]

## 2015-11-09 NOTE — Patient Instructions (Signed)
Medication Instructions:  Your physician has recommended you make the following change in your medication:  START taking protonix 40mg  once daily   Labwork: none  Testing/Procedures: none  Follow-Up: Your physician recommends that you schedule a follow-up appointment in: one month with Dr. Kirke CorinArida.    Any Other Special Instructions Will Be Listed Below (If Applicable).     If you need a refill on your cardiac medications before your next appointment, please call your pharmacy.

## 2015-11-20 ENCOUNTER — Telehealth: Payer: Self-pay | Admitting: Cardiovascular Disease

## 2015-11-20 ENCOUNTER — Ambulatory Visit: Payer: BLUE CROSS/BLUE SHIELD | Admitting: Nurse Practitioner

## 2015-11-20 NOTE — Telephone Encounter (Signed)
"  Inform the patient that I reviewed with my colleagues regarding the possibility of proceeding with angioplasty and the blocked artery. We think the chance of success is low and the risk is high. Thus, I recommend continuing medical therapy for now."  S/w pt daughter, James Yates, who verbalized understanding.  She is concerned that pt diet consists mainly of rice. Suggested they offer pt more fruits and vegetables. She asks if he should take Grapefruit supplement. Reviewed April labs which pt was instructed to continue same medications as cholesterol was 159. Confirmed pt is taking lipitor. Suggested to daughter that pt continue lipitor as prescribed.  She is agreeable w/plan and confirmed July appt.  Daughter had no further questions at this time.

## 2015-12-11 ENCOUNTER — Telehealth: Payer: Self-pay

## 2015-12-11 ENCOUNTER — Encounter: Payer: Self-pay | Admitting: Cardiovascular Disease

## 2015-12-11 ENCOUNTER — Ambulatory Visit (INDEPENDENT_AMBULATORY_CARE_PROVIDER_SITE_OTHER): Payer: BLUE CROSS/BLUE SHIELD | Admitting: Cardiovascular Disease

## 2015-12-11 VITALS — BP 130/84 | HR 87 | Ht 65.0 in | Wt 198.5 lb

## 2015-12-11 DIAGNOSIS — R0602 Shortness of breath: Secondary | ICD-10-CM

## 2015-12-11 DIAGNOSIS — I350 Nonrheumatic aortic (valve) stenosis: Secondary | ICD-10-CM | POA: Diagnosis not present

## 2015-12-11 DIAGNOSIS — I25118 Atherosclerotic heart disease of native coronary artery with other forms of angina pectoris: Secondary | ICD-10-CM | POA: Diagnosis not present

## 2015-12-11 DIAGNOSIS — R079 Chest pain, unspecified: Secondary | ICD-10-CM

## 2015-12-11 MED ORDER — RANOLAZINE ER 500 MG PO TB12: 500.0000 mg | ORAL_TABLET | Freq: Two times a day (BID) | ORAL | Status: DC

## 2015-12-11 NOTE — Patient Instructions (Signed)
Medication Instructions:  Your physician has recommended you make the following change in your medication:  START taking ranexa 500mg  twice daily   Labwork: none  Testing/Procedures: none  Follow-Up: Your physician recommends that you schedule a follow-up appointment in: one month with Dr. Kirke CorinArida.    Any Other Special Instructions Will Be Listed Below (If Applicable).     If you need a refill on your cardiac medications before your next appointment, please call your pharmacy.

## 2015-12-11 NOTE — Telephone Encounter (Signed)
Prior Authorization started for Ranexa ER 500 mg one tablet twice a day.  Spoke with Kimberlee NearingWilliam B with [employer] [place] who will fax over a prior authorization form that Dr. Kirke CorinArida needs to fill out and sign.

## 2015-12-11 NOTE — Progress Notes (Signed)
Cardiology Office Note   Date:  12/11/2015   ID:  James Yates, DOB 02/20/1956, MRN [id number]  PCP: None Cardiologist:   Lorine BearsMuhammad Diangelo Radel, MD   Chief Complaint  Patient presents with  . other    1 month follow up. meds reviewed by the patient verbally. Pt. c/o shortness of breath and chest pain with feeling fatigue.       History of Present Illness: James LevanWalter Viscomi is a 60 y.o. male who presents for a followup visit regarding coronary artery disease status post CABG in February of 2015 after NSTEMI.  Other medical problems include hypertension, hyperlipidemia and bicuspid aortic valve with mild to moderate stenosis noted on intraoperative TEE. Most recent echocardiogram in February 2017 showed normal LV systolic function with mild to moderate aortic stenosis with a peak gradient of 24 mmHg. He is here today for a follow-up visit regarding chronic stable angina. Most recent cardiac catheterization in June 2017 showed severe underlying three-vessel coronary artery disease with patent LIMA to LAD and SVG to RCA. The SVG to the left circumflex was occluded. Native left circumflex was occluded proximally with faint collaterals. Aortic stenosis was moderate with peak to peak gradient of 22 mmHg . During last visit, I added Protonix for symptoms of GERD with subsequent improvement. I reviewed his cardiac catheterization with our CTO team and the opinion was that the left circumflex was not optimal for PCI. He reports stable exertional symptoms of chest pain and tightness with moderate activities. Very rarely at rest.  Past Medical History  Diagnosis Date  . Medical history non-contributory   . Glucose intolerance (impaired glucose tolerance)   . MI (myocardial infarction) (HCC)   . Obesity   . Bronchitis   . Coronary artery disease     Non-ST elevation myocardial infarction cardiac catheterization showed significant three-vessel coronary artery disease. status post CABG in February of  2015 with LIMA to LAD, SVG to right PDA and sequential SVG to OM 1 and OM 3. Ejection fraction was 60%.  . Hyperlipidemia   . Hypertension   . Aortic valve disease     bicuspid aortic valve with mild stenosis    Past Surgical History  Procedure Laterality Date  . No past surgeries    . Coronary artery bypass graft N/A 07/05/2013    Procedure: CORONARY ARTERY BYPASS GRAFTING (CABG);  Surgeon: Kerin PernaPeter Van Trigt, MD;  [facility];  Service: Open Heart Surgery;  Laterality: N/A;  . Intraoperative transesophageal echocardiogram N/A 07/05/2013    Procedure: INTRAOPERATIVE TRANSESOPHAGEAL ECHOCARDIOGRAM;  Surgeon: Kerin PernaPeter Van Trigt, MD;  [facility];  Service: Open Heart Surgery;  Laterality: N/A;  . Cardiac catheterization  06/2013    [facility]  . Cardiac catheterization N/A 10/29/2015    Procedure: Left Heart Cath and Cors/Grafts Angiography;  Surgeon: Iran OuchMuhammad A Takeila Thayne, MD;  Location: [facility] INVASIVE CV LAB;  Service: Cardiovascular;  Laterality: N/A;     Current Outpatient Prescriptions  Medication Sig Dispense Refill  . amLODipine (NORVASC) 5 MG tablet Take 1 tablet (5 mg total) by mouth daily. 30 tablet 3  . aspirin EC 81 MG tablet Take 1 tablet (81 mg total) by mouth daily. 30 tablet 3  . atorvastatin (LIPITOR) 80 MG tablet TAKE 1 TABLET (80 MG TOTAL) BY MOUTH DAILY AT 6 PM. (Patient taking differently: Take 80 mg by mouth daily at 6 PM. TAKE 1 TABLET (80 MG TOTAL) BY MOUTH DAILY AT 6 PM.) 90 tablet 3  . clopidogrel (PLAVIX) 75 MG  tablet Take 1 tablet (75 mg total) by mouth daily. 30 tablet 5  . ibuprofen (ADVIL,MOTRIN) 100 MG tablet Take 200 mg by mouth every 6 (six) hours as needed for pain.     . metoprolol (LOPRESSOR) 100 MG tablet Take 1 tablet (100 mg total) by mouth 2 (two) times daily. 60 tablet 10  . pantoprazole (PROTONIX) 40 MG tablet Take 1 tablet (40 mg total) by mouth daily. 30 tablet 3   No current facility-administered medications for this visit.    Allergies:   Review of  patient's allergies indicates no known allergies.    Social History:  The patient  reports that he has never smoked. He does not have any smokeless tobacco history on file. He reports that he does not drink alcohol or use illicit drugs.   Family History:  The patient's family history includes Heart attack in his father; Heart disease in his father.    ROS:  Please see the history of present illness.   Otherwise, review of systems are positive for none.   All other systems are reviewed and negative.    PHYSICAL EXAM: VS:  BP 130/84 mmHg  Pulse 87  Ht 5\' 5"  (1.651 m)  Wt 198 lb 8 oz (90.039 kg)  BMI 33.03 kg/m2 , BMI Body mass index is 33.03 kg/(m^2). GEN: Well nourished, well developed, in no acute distress HEENT: normal Neck: no JVD, carotid bruits, or masses Cardiac: RRR; no  rubs, or gallops,no edema . There is a 2/6 crescendo decrescendo aortic murmur which is mid peaking. Respiratory:  clear to auscultation bilaterally, normal work of breathing GI: soft, nontender, nondistended, + BS MS: no deformity or atrophy Skin: warm and dry, no rash Neuro:  Strength and sensation are intact Psych: euthymic mood, full affect   EKG:  EKG is ordered today. The ekg ordered today demonstrates normal sinus rhythm with right bundle branch block.   Recent Labs: 09/21/2015: ALT 20 10/27/2015: BUN 17; Creatinine, Ser 0.95; Hemoglobin 15.1; Platelets 167; Potassium 3.7; Sodium 137    Lipid Panel    Component Value Date/Time   CHOL 159 09/21/2015 1457   CHOL 215* 07/02/2013 0058   TRIG 208* 09/21/2015 1457   TRIG 143 07/02/2013 0058   HDL 39* 09/21/2015 1457   HDL 38* 07/02/2013 0058   CHOLHDL 4.1 09/21/2015 1457   VLDL 29 07/02/2013 0058   LDLCALC 78 09/21/2015 1457   LDLCALC 148* 07/02/2013 0058      Wt Readings from Last 3 Encounters:  12/11/15 198 lb 8 oz (90.039 kg)  11/09/15 200 lb 8 oz (90.946 kg)  10/29/15 201 lb (91.173 kg)        ASSESSMENT AND PLAN:  1.   Coronary artery disease involving native coronary arteries with stable angina:   His symptoms are due to an occluded left circumflex with no patent graft to that area. As outlined above, the left circumflex is not optimal for PCI. I recommend continuing aggressive medical therapy. Given his residual symptoms in spite of treatment with maximal dose metoprolol and amlodipine, I elected to add Ranexa 500 mg twice daily with plans to increase to thousand milligrams twice daily.  2. Bicuspid aortic valve with  moderate stenosis. Continue to monitor.  Recommend repeat echocardiogram in June 2018.  3. Essential hypertension: Blood pressure is reasonably controlled on current medications  4. Hyperlipidemia: Continue high dose atorvastatin. Recent lipid profile showed an LDL of 78.  5. GERD: Symptoms improved with Protonix  Disposition:  FU with me in 1 months  Signed,  Lorine Bears, MD  12/11/2015 11:18 AM    [facility]

## 2015-12-15 NOTE — Telephone Encounter (Signed)
Pt has been Approved for Ranexa 500 mg tablet. 12/14/15-05/29/2038.

## 2016-01-19 ENCOUNTER — Encounter: Payer: Self-pay | Admitting: Cardiovascular Disease

## 2016-01-19 ENCOUNTER — Ambulatory Visit (INDEPENDENT_AMBULATORY_CARE_PROVIDER_SITE_OTHER): Payer: BLUE CROSS/BLUE SHIELD | Admitting: Cardiovascular Disease

## 2016-01-19 VITALS — BP 110/66 | HR 67 | Ht 64.0 in | Wt 198.0 lb

## 2016-01-19 DIAGNOSIS — I25118 Atherosclerotic heart disease of native coronary artery with other forms of angina pectoris: Secondary | ICD-10-CM | POA: Diagnosis not present

## 2016-01-19 DIAGNOSIS — I10 Essential (primary) hypertension: Secondary | ICD-10-CM | POA: Diagnosis not present

## 2016-01-19 DIAGNOSIS — E785 Hyperlipidemia, unspecified: Secondary | ICD-10-CM

## 2016-01-19 MED ORDER — RANOLAZINE ER 1000 MG PO TB12: 1000.0000 mg | ORAL_TABLET | Freq: Two times a day (BID) | ORAL | 5 refills | Status: DC

## 2016-01-19 NOTE — Patient Instructions (Signed)
Medication Instructions:  Your physician has recommended you make the following change in your medication:  INCREASE ranexa to 1000mg  twice daily   Labwork: none  Testing/Procedures: none  Follow-Up: Your physician wants you to follow-up in: 6 months with Dr. Kirke CorinArida.  You will receive a reminder letter in the mail two months in advance. If you don't receive a letter, please call our office to schedule the follow-up appointment.   Any Other Special Instructions Will Be Listed Below (If Applicable).     If you need a refill on your cardiac medications before your next appointment, please call your pharmacy.

## 2016-01-19 NOTE — Progress Notes (Signed)
Cardiology Office Note   Date:  01/19/2016   ID:  Rosalita LevanWalter Bruening, DOB 05/23/1956, MRN [id number]  PCP: None Cardiologist:   Lorine BearsMuhammad Arida, MD   Chief Complaint  Patient presents with  . Coronary Artery Disease    chest pain, with & w/o activity, last CP was 01/18/16, no active CP today 01/19/16 per pt      History of Present Illness: Rosalita LevanWalter Dabbs is a 60 y.o. male who presents for a followup visit regarding coronary artery disease status post CABG in February of 2015 after NSTEMI.  Other medical problems include hypertension, hyperlipidemia and bicuspid aortic valve with mild to moderate stenosis noted on intraoperative TEE. Most recent echocardiogram in February 2017 showed normal LV systolic function with mild to moderate aortic stenosis with a peak gradient of 24 mmHg. He is here today for a follow-up visit regarding chronic stable angina. Most recent cardiac catheterization in June 2017 showed severe underlying three-vessel coronary artery disease with patent LIMA to LAD and SVG to RCA. The SVG to the left circumflex was occluded. Native left circumflex was occluded proximally with faint collaterals. Aortic stenosis was moderate with peak to peak gradient of 22 mmHg .  I reviewed his cardiac catheterization with our CTO team and the opinion was that the left circumflex was not optimal for PCI.  During last visit, I added Ranexa 500 mg twice daily. He reports improvement in exertional chest pain. He works in [employer] and he does report some chest tightness if he overexerts himself or if he tries to rush with activities.  Past Medical History:  Diagnosis Date  . Aortic valve disease    bicuspid aortic valve with mild stenosis  . Bronchitis   . Coronary artery disease    Non-ST elevation myocardial infarction cardiac catheterization showed significant three-vessel coronary artery disease. status post CABG in February of 2015 with LIMA to LAD, SVG to right PDA and sequential SVG  to OM 1 and OM 3. Ejection fraction was 60%.  . Glucose intolerance (impaired glucose tolerance)   . Hyperlipidemia   . Hypertension   . Medical history non-contributory   . MI (myocardial infarction) (HCC)   . Obesity     Past Surgical History:  Procedure Laterality Date  . CARDIAC CATHETERIZATION  06/2013   [facility]  . CARDIAC CATHETERIZATION N/A 10/29/2015   Procedure: Left Heart Cath and Cors/Grafts Angiography;  Surgeon: Iran OuchMuhammad A Arida, MD;  Location: [facility] INVASIVE CV LAB;  Service: Cardiovascular;  Laterality: N/A;  . CORONARY ARTERY BYPASS GRAFT N/A 07/05/2013   Procedure: CORONARY ARTERY BYPASS GRAFTING (CABG);  Surgeon: Kerin PernaPeter Van Trigt, MD;  [facility];  Service: Open Heart Surgery;  Laterality: N/A;  . INTRAOPERATIVE TRANSESOPHAGEAL ECHOCARDIOGRAM N/A 07/05/2013   Procedure: INTRAOPERATIVE TRANSESOPHAGEAL ECHOCARDIOGRAM;  Surgeon: Kerin PernaPeter Van Trigt, MD;  [facility];  Service: Open Heart Surgery;  Laterality: N/A;  . NO PAST SURGERIES       Current Outpatient Prescriptions  Medication Sig Dispense Refill  . amLODipine (NORVASC) 5 MG tablet Take 1 tablet (5 mg total) by mouth daily. 30 tablet 3  . aspirin EC 81 MG tablet Take 1 tablet (81 mg total) by mouth daily. 30 tablet 3  . atorvastatin (LIPITOR) 80 MG tablet TAKE 1 TABLET (80 MG TOTAL) BY MOUTH DAILY AT 6 PM. 90 tablet 3  . clopidogrel (PLAVIX) 75 MG tablet Take 1 tablet (75 mg total) by mouth daily. 30 tablet 5  . ibuprofen (ADVIL,MOTRIN) 100 MG tablet  Take 200 mg by mouth every 6 (six) hours as needed for pain.     . metoprolol (LOPRESSOR) 100 MG tablet Take 1 tablet (100 mg total) by mouth 2 (two) times daily. 60 tablet 10  . pantoprazole (PROTONIX) 40 MG tablet Take 1 tablet (40 mg total) by mouth daily. 30 tablet 3  . ranolazine (RANEXA) 500 MG 12 hr tablet Take 1 tablet (500 mg total) by mouth 2 (two) times daily. 60 tablet 3   No current facility-administered medications for this visit.     Allergies:   Review  of patient's allergies indicates no known allergies.    Social History:  The patient  reports that he has never smoked. He has never used smokeless tobacco. He reports that he does not drink alcohol or use drugs.   Family History:  The patient's family history includes Heart attack in his father; Heart disease in his father.    ROS:  Please see the history of present illness.   Otherwise, review of systems are positive for none.   All other systems are reviewed and negative.    PHYSICAL EXAM: VS:  BP 110/66 (BP Location: Right Arm, Patient Position: Sitting, Cuff Size: Normal)   Pulse 67   Ht 5\' 4"  (1.626 m)   Wt 198 lb (89.8 kg)   SpO2 96%   BMI 33.99 kg/m  , BMI Body mass index is 33.99 kg/m. GEN: Well nourished, well developed, in no acute distress HEENT: normal Neck: no JVD, carotid bruits, or masses Cardiac: RRR; no  rubs, or gallops,no edema . There is a 2/6 crescendo decrescendo aortic murmur which is mid peaking. Respiratory:  clear to auscultation bilaterally, normal work of breathing GI: soft, nontender, nondistended, + BS MS: no deformity or atrophy Skin: warm and dry, no rash Neuro:  Strength and sensation are intact Psych: euthymic mood, full affect   EKG:  EKG is not ordered today.    Recent Labs: 09/21/2015: ALT 20 10/27/2015: BUN 17; Creatinine, Ser 0.95; Hemoglobin 15.1; Platelets 167; Potassium 3.7; Sodium 137    Lipid Panel    Component Value Date/Time   CHOL 159 09/21/2015 1457   CHOL 215 (H) 07/02/2013 0058   TRIG 208 (H) 09/21/2015 1457   TRIG 143 07/02/2013 0058   HDL 39 (L) 09/21/2015 1457   HDL 38 (L) 07/02/2013 0058   CHOLHDL 4.1 09/21/2015 1457   VLDL 29 07/02/2013 0058   LDLCALC 78 09/21/2015 1457   LDLCALC 148 (H) 07/02/2013 0058      Wt Readings from Last 3 Encounters:  01/19/16 198 lb (89.8 kg)  12/11/15 198 lb 8 oz (90 kg)  11/09/15 200 lb 8 oz (90.9 kg)        ASSESSMENT AND PLAN:  1.  Coronary artery disease involving  native coronary arteries with stable angina:   His symptoms are due to an occluded left circumflex with no patent graft to that area. As outlined above, the left circumflex is not optimal for PCI. Symptoms improved with Ranexa. I increased the dose today 1000 mg twice daily.  2. Bicuspid aortic valve with  moderate stenosis. Continue to monitor.  Recommend repeat echocardiogram in June 2018.  3. Essential hypertension: Blood pressure is well controlled on current medications  4. Hyperlipidemia: Continue high dose atorvastatin. Recent lipid profile showed an LDL of 78.   Disposition:   FU with me in 6 months  Signed,  Lorine BearsMuhammad Arida, MD  01/19/2016 3:41 PM    [facility]

## 2016-02-07 ENCOUNTER — Other Ambulatory Visit: Payer: Self-pay | Admitting: Cardiovascular Disease

## 2016-04-06 ENCOUNTER — Other Ambulatory Visit: Payer: Self-pay | Admitting: Cardiovascular Disease

## 2016-04-06 MED ORDER — METOPROLOL TARTRATE 100 MG PO TABS: 100.0000 mg | ORAL_TABLET | Freq: Two times a day (BID) | ORAL | 3 refills | Status: DC

## 2016-04-17 ENCOUNTER — Emergency Department
Admission: EM | Admit: 2016-04-17 | Discharge: 2016-04-17 | Disposition: A | Discharge status: Home / Self Care | Payer: BLUE CROSS/BLUE SHIELD | Attending: Emergency Medicine | Admitting: Emergency Medicine

## 2016-04-17 ENCOUNTER — Encounter: Payer: Self-pay | Admitting: Emergency Medicine

## 2016-04-17 ENCOUNTER — Emergency Department: Payer: BLUE CROSS/BLUE SHIELD

## 2016-04-17 DIAGNOSIS — I2510 Atherosclerotic heart disease of native coronary artery without angina pectoris: Secondary | ICD-10-CM | POA: Insufficient documentation

## 2016-04-17 DIAGNOSIS — Z791 Long term (current) use of non-steroidal anti-inflammatories (NSAID): Secondary | ICD-10-CM | POA: Insufficient documentation

## 2016-04-17 DIAGNOSIS — Z951 Presence of aortocoronary bypass graft: Secondary | ICD-10-CM | POA: Insufficient documentation

## 2016-04-17 DIAGNOSIS — Z7982 Long term (current) use of aspirin: Secondary | ICD-10-CM | POA: Diagnosis not present

## 2016-04-17 DIAGNOSIS — I10 Essential (primary) hypertension: Secondary | ICD-10-CM | POA: Diagnosis not present

## 2016-04-17 DIAGNOSIS — I252 Old myocardial infarction: Secondary | ICD-10-CM | POA: Insufficient documentation

## 2016-04-17 DIAGNOSIS — R05 Cough: Secondary | ICD-10-CM | POA: Insufficient documentation

## 2016-04-17 DIAGNOSIS — Z79899 Other long term (current) drug therapy: Secondary | ICD-10-CM | POA: Diagnosis not present

## 2016-04-17 DIAGNOSIS — R059 Cough, unspecified: Secondary | ICD-10-CM

## 2016-04-17 LAB — BASIC METABOLIC PANEL
Anion gap: 7 (ref 5–15)
BUN: 17 mg/dL (ref 6–20)
CALCIUM: 8.7 mg/dL — AB (ref 8.9–10.3)
CO2: 26 mmol/L (ref 22–32)
CREATININE: 1.06 mg/dL (ref 0.61–1.24)
Chloride: 105 mmol/L (ref 101–111)
GFR calc Af Amer: >60 mL/min (ref >60)
GFR calc non Af Amer: >60 mL/min (ref >60)
GLUCOSE: 140 mg/dL — AB (ref 65–99)
Potassium: 3.3 mmol/L — ABNORMAL LOW (ref 3.5–5.1)
Sodium: 138 mmol/L (ref 135–145)

## 2016-04-17 LAB — CBC
HCT: 43.2 % (ref 40.0–52.0)
Hemoglobin: 14.8 g/dL (ref 13.0–18.0)
MCH: 30.5 pg (ref 26.0–34.0)
MCHC: 34.3 g/dL (ref 32.0–36.0)
MCV: 89.1 fL (ref 80.0–100.0)
PLATELETS: 184 10*3/uL (ref 150–440)
RBC: 4.85 MIL/uL (ref 4.40–5.90)
RDW: 13.7 % (ref 11.5–14.5)
WBC: 7.3 10*3/uL (ref 3.8–10.6)

## 2016-04-17 LAB — TROPONIN I

## 2016-04-17 MED ORDER — IPRATROPIUM-ALBUTEROL 0.5-2.5 (3) MG/3ML IN SOLN
RESPIRATORY_TRACT | Status: AC
  Filled 2016-04-17: qty 3

## 2016-04-17 MED ORDER — IPRATROPIUM-ALBUTEROL 0.5-2.5 (3) MG/3ML IN SOLN
3.0000 mL | Freq: Once | RESPIRATORY_TRACT | Status: AC
  Administered 2016-04-17: 3 mL via RESPIRATORY_TRACT

## 2016-04-17 MED ORDER — BENZONATATE 100 MG PO CAPS: 100.0000 mg | ORAL_CAPSULE | Freq: Four times a day (QID) | ORAL | 0 refills | Status: DC | PRN

## 2016-04-17 MED ORDER — ALBUTEROL SULFATE HFA 108 (90 BASE) MCG/ACT IN AERS: 2.0000 | INHALATION_SPRAY | Freq: Four times a day (QID) | RESPIRATORY_TRACT | 0 refills | Status: DC | PRN

## 2016-04-17 NOTE — Discharge Instructions (Signed)
Please seek medical attention for any high fevers, chest pain, shortness of breath, change in behavior, persistent vomiting, bloody stool or any other new or concerning symptoms.  

## 2016-04-17 NOTE — ED Provider Notes (Signed)
[facility] Emergency Department Provider Note   ____________________________________________   I have reviewed the triage vital signs and the nursing notes.   HISTORY  Chief Complaint Chest Pain and Cough   History limited by: Language [facility] Interpreter utilized   HPI James Yates is a 60 y.o. male who presents to the emergency department today because of concerns for cough. It started 2 weeks ago. It has been constant since then. It will happen both day and night. It is typically a dry cough however he states he has had occasional episodes of some phlegm. When he coughs he does have some pain in his right lower chest. He does not have this pain when he is not coughing. He has not had any fevers. Denies any history of smoking. No history of asthma.   Past Medical History:  Diagnosis Date  . Aortic valve disease    bicuspid aortic valve with mild stenosis  . Bronchitis   . Coronary artery disease    Non-ST elevation myocardial infarction cardiac catheterization showed significant three-vessel coronary artery disease. 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 Ejection fraction was 60%.  . Glucose intolerance (impaired glucose tolerance)   . Hyperlipidemia   . Hypertension   . Medical history non-contributory   . MI (myocardial infarction)   . Obesity     Patient Active Problem List   Diagnosis Date Noted  . Effort angina (HCC)   . Night sweats 09/29/2015  . Abdominal pain 09/29/2015  . Trouble swallowing 09/29/2015  . Elevated troponin   . Sepsis (HCC) 07/19/2015  . Fatigue 07/06/2015  . Aortic stenosis 06/23/2014  . Leg edema, right 09/02/2013  . Coronary artery disease   . Hyperlipidemia   . Hypertension   . 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 NSTEMI (non-ST elevated myocardial infarction) (HCC) 07/02/2013    Past Surgical History:  Procedure Laterality Date  .  CARDIAC CATHETERIZATION  06/2013   [facility]  . CARDIAC CATHETERIZATION N/A 10/29/2015   Procedure: Left Heart Cath and Cors/Grafts Angiography;  Surgeon: Iran Ouch, MD;  Location: [facility] INVASIVE CV LAB;  Service: Cardiovascular;  Laterality: N/A;  . CORONARY ARTERY BYPASS GRAFT N/A 07/05/2013   Procedure: CORONARY ARTERY BYPASS GRAFTING (CABG);  Surgeon: [name], MD;  Location: [facility] OR;  Service: Open Heart Surgery;  Laterality: N/A;  . INTRAOPERATIVE TRANSESOPHAGEAL ECHOCARDIOGRAM N/A 07/05/2013   Procedure: INTRAOPERATIVE TRANSESOPHAGEAL ECHOCARDIOGRAM;  Surgeon: [name], MD;  Location[facility];  Service: Open Heart Surgery;  Laterality: N/A;  . NO PAST SURGERIES      Prior to Admission medications   Medication Sig Start Date End Date Taking? Authorizing Provider  amLODipine (NORVASC) 5 MG tablet TAKE 1 TABLET (5 MG TOTAL) BY MOUTH DAILY. 02/08/16   Iran Ouch, MD  aspirin EC 81 MG tablet Take 1 tablet (81 mg total) by mouth daily. 10/20/15   Iran Ouch, MD  atorvastatin (LIPITOR) 80 MG tablet TAKE 1 TABLET (80 MG TOTAL) BY MOUTH DAILY AT 6 PM. 06/10/15   Iran Ouch, MD  clopidogrel (PLAVIX) 75 MG tablet TAKE 1 TABLET (75 MG TOTAL) BY MOUTH DAILY. 04/06/16   Iran Ouch, MD  ibuprofen (ADVIL,MOTRIN) 100 MG tablet Take 200 mg by mouth every 6 (six) hours as needed for pain.     Historical Provider, MD  metoprolol (LOPRESSOR) 100 MG  tablet Take 1 tablet (100 mg total) by mouth 2 (two) times daily. 04/06/16   Iran OuchMuhammad A Arida, MD  pantoprazole (PROTONIX) 40 MG tablet Take 1 tablet (40 mg total) by mouth daily. 11/09/15   Iran OuchMuhammad A Arida, MD  ranolazine (RANEXA) 1000 MG SR tablet Take 1 tablet (1,000 mg total) by mouth 2 (two) times daily. 01/19/16   Iran OuchMuhammad A Arida, MD    Allergies Patient has no known allergies.  Family History  Problem Relation Age of Onset  . Heart disease Father   . Heart attack Father     Social History Social History  Substance Use  Topics  . Smoking status: Never Smoker  . Smokeless tobacco: Never Used  . Alcohol use No    Review of Systems  Constitutional: Negative for fever. Cardiovascular: Positive for right lower chest pain. Respiratory: Positive for cough.  Gastrointestinal: Negative for abdominal pain, vomiting and diarrhea. Genitourinary: Negative for dysuria. Musculoskeletal: Negative for back pain. Skin: Negative for rash. Neurological: Negative for headaches, focal weakness or numbness.  10-point ROS otherwise negative.  ____________________________________________   PHYSICAL EXAM:  VITAL SIGNS: ED Triage Vitals  Enc Vitals Group     BP 04/17/16 1053 (!) 153/90     Pulse Rate 04/17/16 1053 77     Resp 04/17/16 1053 18     Temp 04/17/16 1053 98.6 F (37 C)     Temp Source 04/17/16 1053 Oral     SpO2 04/17/16 1053 96 %     Weight 04/17/16 1051 108 lb (49 kg)     Height 04/17/16 1051 5\' 5"  (1.651 m)     Head Circumference --      Peak Flow --      Pain Score 04/17/16 1052 7   Constitutional: Alert and oriented. Well appearing and in no distress. Eyes: Conjunctivae are normal. Normal extraocular movements. ENT   Head: Normocephalic and atraumatic.   Nose: No congestion/rhinnorhea.   Mouth/Throat: Mucous membranes are moist.   Neck: No stridor. Hematological/Lymphatic/Immunilogical: No cervical lymphadenopathy. Cardiovascular: Normal rate, regular rhythm.  Systolic grade II/VI murmur.  Respiratory: Normal respiratory effort without tachypnea nor retractions. Breath sounds are clear and equal bilaterally. No wheezes/rales/rhonchi. Gastrointestinal: Soft and nontender. No distention.  Genitourinary: Deferred Musculoskeletal: Normal range of motion in all extremities. No lower extremity edema. Neurologic:  Normal speech and language. No gross focal neurologic deficits are appreciated.  Skin:  Skin is warm, dry and intact. No rash noted. Psychiatric: Mood and affect are  normal. Speech and behavior are normal. Patient exhibits appropriate insight and judgment.  ____________________________________________    LABS (pertinent positives/negatives)  Labs Reviewed  BASIC METABOLIC PANEL - Abnormal; Notable for the following:       Result Value   Potassium 3.3 (*)    Glucose, Bld 140 (*)    Calcium 8.7 (*)    All other components within normal limits  CBC  TROPONIN I     ____________________________________________   EKG  I, Phineas SemenGraydon Caris Cerveny, attending physician, personally viewed and interpreted this EKG  EKG Time: 1051 Rate: 77 Rhythm: normal sinus rhythm Axis: normal Intervals: qtc 466 QRS: RBBB ST changes: no st elevation Impression: abnormal ekg  RBBB present on EKG dated December 11, 2015 ____________________________________________    RADIOLOGY  CXR IMPRESSION: Lingular scarring.  Prior CABG.  No active disease.  I, Rukaya Kleinschmidt, personally viewed and evaluated these images (plain radiographs) as part of my medical decision making. ____________________________________________   PROCEDURES  Procedures  ____________________________________________  INITIAL IMPRESSION / ASSESSMENT AND PLAN / ED COURSE  Pertinent labs & imaging results that were available during my care of the patient were reviewed by me and considered in my medical decision making (see chart for details).  Patient with 2 week history of cough. Chest x-ray without any concerning findings. No leukocytosis. No concerning findings on auscultation of the lungs. This point think bronchitis possible. Will try DuoNeb treatment and reassess.  Clinical Course    Patient states he did feel better after duoneb treatment. Will plan on discharging home with albuterol inhaler as well as tessalon perles.  ____________________________________________   FINAL CLINICAL IMPRESSION(S) / ED DIAGNOSES  Final diagnoses:  Cough     Note: This dictation was prepared  with Dragon dictation. Any transcriptional errors that result from this process are unintentional    Phineas SemenGraydon Ishia Tenorio, MD 04/17/16 1253

## 2016-04-17 NOTE — ED Triage Notes (Signed)
Pt reports chest pain and cough for two weeks. Pt points to right side of chest and grimaces. Pt reports feeling numbness in both arms and hands. Pt reports chills. Pt reports coughing up greenish phlegm. Pt reports headache and SOB.

## 2016-07-03 ENCOUNTER — Other Ambulatory Visit: Payer: Self-pay | Admitting: Cardiovascular Disease

## 2016-07-21 ENCOUNTER — Other Ambulatory Visit: Payer: Self-pay | Admitting: Cardiovascular Disease

## 2016-07-21 ENCOUNTER — Ambulatory Visit: Payer: BLUE CROSS/BLUE SHIELD | Admitting: Cardiovascular Disease

## 2016-07-26 ENCOUNTER — Ambulatory Visit (INDEPENDENT_AMBULATORY_CARE_PROVIDER_SITE_OTHER): Payer: BLUE CROSS/BLUE SHIELD | Admitting: Cardiovascular Disease

## 2016-07-26 ENCOUNTER — Encounter: Payer: Self-pay | Admitting: Cardiovascular Disease

## 2016-07-26 VITALS — BP 124/86 | HR 73 | Ht 65.0 in | Wt 200.2 lb

## 2016-07-26 DIAGNOSIS — I25118 Atherosclerotic heart disease of native coronary artery with other forms of angina pectoris: Secondary | ICD-10-CM | POA: Diagnosis not present

## 2016-07-26 DIAGNOSIS — I10 Essential (primary) hypertension: Secondary | ICD-10-CM

## 2016-07-26 DIAGNOSIS — E785 Hyperlipidemia, unspecified: Secondary | ICD-10-CM | POA: Diagnosis not present

## 2016-07-26 DIAGNOSIS — I359 Nonrheumatic aortic valve disorder, unspecified: Secondary | ICD-10-CM | POA: Diagnosis not present

## 2016-07-26 DIAGNOSIS — R5383 Other fatigue: Secondary | ICD-10-CM

## 2016-07-26 DIAGNOSIS — R61 Generalized hyperhidrosis: Secondary | ICD-10-CM | POA: Diagnosis not present

## 2016-07-26 NOTE — Progress Notes (Signed)
Cardiology Office Note   Date:  07/26/2016   ID:  James Yates, DOB May 08, 1956, MRN [id number]  PCP: None Cardiologist:   Lorine Bears, MD   Chief Complaint  Patient presents with  . other    6 month follow up. Patient c/o coughing really bad. Cheat pain has subsided. Meds reviewed verbally with patient.       History of Present Illness: James Yates is a 61 y.o. male who presents for a followup visit regarding coronary artery disease status post CABG in February of 2015 after NSTEMI.  Other medical problems include hypertension, hyperlipidemia and bicuspid aortic valve with mild to moderate stenosis noted on intraoperative TEE. Most recent echocardiogram in February 2017 showed normal LV systolic function with mild to moderate aortic stenosis with a peak gradient of 24 mmHg.  Most recent cardiac catheterization in June 2017 showed severe underlying three-vessel coronary artery disease with patent LIMA to LAD and SVG to RCA. The SVG to the left circumflex was occluded. Native left circumflex was occluded proximally with faint collaterals. Aortic stenosis was moderate with peak to peak gradient of 22 mmHg .  I reviewed his cardiac catheterization with our CTO team and the opinion was that the left circumflex was not optimal for PCI.   He has been treated medically for angina and overall he has been stable. His biggest issue seems to be night sweats and chills at night. He also complains of dry cough mostly when he has heartburn.  Past Medical History:  Diagnosis Date  . Aortic valve disease    bicuspid aortic valve with mild stenosis  . Bronchitis   . Coronary artery disease    Non-ST elevation myocardial infarction cardiac catheterization showed significant three-vessel coronary artery disease. 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 Ejection fraction was 60%.  . Glucose intolerance (impaired glucose  tolerance)   . Hyperlipidemia   . Hypertension   . Medical history non-contributory   . MI (myocardial infarction)   . Obesity     Past Surgical History:  Procedure Laterality Date  . CARDIAC CATHETERIZATION  06/2013   [facility]  . CARDIAC CATHETERIZATION N/A 10/29/2015   Procedure: Left Heart Cath and Cors/Grafts Angiography;  Surgeon: Iran Ouch, MD;  Location: [facility] INVASIVE CV LAB;  Service: Cardiovascular;  Laterality: N/A;  . CORONARY ARTERY BYPASS GRAFT N/A 07/05/2013   Procedure: CORONARY ARTERY BYPASS GRAFTING (CABG);  Surgeon: [name], MD;  Location: [facility] OR;  Service: Open Heart Surgery;  Laterality: N/A;  . INTRAOPERATIVE TRANSESOPHAGEAL ECHOCARDIOGRAM N/A 07/05/2013   Procedure: INTRAOPERATIVE TRANSESOPHAGEAL ECHOCARDIOGRAM;  Surgeon: [name], MD;  Location: [facility] OR;  Service: Open Heart Surgery;  Laterality: N/A;  . NO PAST SURGERIES       Current Outpatient Prescriptions  Medication Sig Dispense Refill  . amLODipine (NORVASC) 5 MG tablet TAKE 1 TABLET (5 MG TOTAL) BY MOUTH DAILY. 30 tablet 3  . atorvastatin (LIPITOR) 80 MG tablet TAKE 1 TABLET (80 MG TOTAL) BY MOUTH DAILY AT 6 PM. 90 tablet 3  . benzonatate (TESSALON PERLES) 100 MG capsule Take 1 capsule (100 mg total) by mouth every 6 (six) hours as needed for cough. 30 capsule 0  . ibuprofen (ADVIL,MOTRIN) 100 MG tablet Take 200 mg by mouth every 6 (six) hours as needed for pain.     . metoprolol (LOPRESSOR) 100 MG tablet Take 1 tablet (100  mg total) by mouth 2 (two) times daily. 60 tablet 3  . RANEXA 1000 MG SR tablet TAKE 1 TABLET (1,000 MG TOTAL) BY MOUTH 2 (TWO) TIMES DAILY. 60 tablet 5  . aspirin EC 81 MG tablet Take 1 tablet (81 mg total) by mouth daily. (Patient not taking: Reported on 07/26/2016) 30 tablet 3  . clopidogrel (PLAVIX) 75 MG tablet TAKE 1 TABLET (75 MG TOTAL) BY MOUTH DAILY. (Patient not taking: Reported on 07/26/2016) 30 tablet 3  . pantoprazole (PROTONIX) 40 MG tablet Take 1 tablet (40 mg  total) by mouth daily. (Patient not taking: Reported on 07/26/2016) 30 tablet 3   No current facility-administered medications for this visit.     Allergies:   Patient has no known allergies.    Social History:  The patient  reports that he has never smoked. He has never used smokeless tobacco. He reports that he does not drink alcohol or use drugs.   Family History:  The patient's family history includes Heart attack in his father; Heart disease in his father.    ROS:  Please see the history of present illness.   Otherwise, review of systems are positive for none.   All other systems are reviewed and negative.    PHYSICAL EXAM: VS:  BP 124/86 (BP Location: Left Arm, Patient Position: Sitting, Cuff Size: Normal)   Pulse 73   Ht 5\' 5"  (1.651 m)   Wt 200 lb 4 oz (90.8 kg)   BMI 33.32 kg/m  , BMI Body mass index is 33.32 kg/m. GEN: Well nourished, well developed, in no acute distress HEENT: normal Neck: no JVD, carotid bruits, or masses Cardiac: RRR; no  rubs, or gallops,no edema . There is a 2/6 crescendo decrescendo aortic murmur which is mid peaking. Respiratory:  clear to auscultation bilaterally, normal work of breathing GI: soft, nontender, nondistended, + BS MS: no deformity or atrophy Skin: warm and dry, no rash Neuro:  Strength and sensation are intact Psych: euthymic mood, full affect   EKG:  EKG is  ordered today. EKG done showed normal sinus rhythm with right bundle branch block.   Recent Labs: 09/21/2015: ALT 20 04/17/2016: BUN 17; Creatinine, Ser 1.06; Hemoglobin 14.8; Platelets 184; Potassium 3.3; Sodium 138    Lipid Panel    Component Value Date/Time   CHOL 159 09/21/2015 1457   CHOL 215 (H) 07/02/2013 0058   TRIG 208 (H) 09/21/2015 1457   TRIG 143 07/02/2013 0058   HDL 39 (L) 09/21/2015 1457   HDL 38 (L) 07/02/2013 0058   CHOLHDL 4.1 09/21/2015 1457   VLDL 29 07/02/2013 0058   LDLCALC 78 09/21/2015 1457   LDLCALC 148 (H) 07/02/2013 0058      Wt  Readings from Last 3 Encounters:  07/26/16 200 lb 4 oz (90.8 kg)  04/17/16 202 lb 6.1 oz (91.8 kg)  01/19/16 198 lb (89.8 kg)        ASSESSMENT AND PLAN:  1.  Coronary artery disease involving native coronary arteries with stable angina:   His symptoms are due to an occluded left circumflex with no patent graft to that area.  His angina is well controlled with medications.  2. Bicuspid aortic valve with  moderate stenosis. Given his reported night sweats, I requested an echocardiogram to evaluate severity of aortic stenosis and ensure no vegetation. I also requested CBC and sedimentation rate.  3. Essential hypertension: Blood pressure is well controlled on current medications  4. Hyperlipidemia: Continue high dose atorvastatin. Recent lipid  profile showed an LDL of 78.   Disposition:   FU with me in 6 months  Signed,  Lorine BearsMuhammad Lashonne Shull, MD  07/26/2016 4:43 PM    [facility]

## 2016-07-26 NOTE — Patient Instructions (Signed)
Medication Instructions:  Your physician recommends that you continue on your current medications as directed. Please refer to the Current Medication list given to you today.   Labwork: CBC and sedimentation [facility]  Testing/Procedures: Your physician has requested that you have an echocardiogram. Echocardiography is a painless test that uses sound waves to create images of your heart. It provides your doctor with information about the size and shape of your heart and how well your heart's chambers and valves are working. This procedure takes approximately one hour. There are no restrictions for this procedure.    Follow-Up: Your physician wants you to follow-up in: 6 months with Dr. Kirke CorinArida.  You will receive a reminder letter in the mail two months in advance. If you don't receive a letter, please call our office to schedule the follow-up appointment.   Any Other Special Instructions Will Be Listed Below (If Applicable).     If you need a refill on your cardiac medications before your next appointment, please call your pharmacy.

## 2016-07-30 ENCOUNTER — Other Ambulatory Visit: Payer: Self-pay | Admitting: Cardiovascular Disease

## 2016-08-18 ENCOUNTER — Other Ambulatory Visit
Admission: RE | Admit: 2016-08-18 | Discharge: 2016-08-18 | Disposition: A | Discharge status: Home / Self Care | Payer: BLUE CROSS/BLUE SHIELD | Source: Ambulatory Visit | Attending: Cardiovascular Disease | Admitting: Cardiovascular Disease

## 2016-08-18 DIAGNOSIS — R61 Generalized hyperhidrosis: Secondary | ICD-10-CM | POA: Insufficient documentation

## 2016-08-18 DIAGNOSIS — R5383 Other fatigue: Secondary | ICD-10-CM | POA: Insufficient documentation

## 2016-08-18 LAB — CBC
HCT: 44.9 % (ref 40.0–52.0)
Hemoglobin: 15.3 g/dL (ref 13.0–18.0)
MCH: 30.2 pg (ref 26.0–34.0)
MCHC: 34.1 g/dL (ref 32.0–36.0)
MCV: 88.6 fL (ref 80.0–100.0)
PLATELETS: 153 10*3/uL (ref 150–440)
RBC: 5.07 MIL/uL (ref 4.40–5.90)
RDW: 13.2 % (ref 11.5–14.5)
WBC: 5.4 10*3/uL (ref 3.8–10.6)

## 2016-08-18 LAB — SEDIMENTATION RATE: SED RATE: 3 mm/h (ref 0–20)

## 2016-08-23 ENCOUNTER — Other Ambulatory Visit: Payer: Self-pay

## 2016-08-23 ENCOUNTER — Ambulatory Visit (INDEPENDENT_AMBULATORY_CARE_PROVIDER_SITE_OTHER): Payer: BLUE CROSS/BLUE SHIELD

## 2016-08-23 DIAGNOSIS — I359 Nonrheumatic aortic valve disorder, unspecified: Secondary | ICD-10-CM

## 2017-03-27 ENCOUNTER — Telehealth: Payer: Self-pay | Admitting: Cardiovascular Disease

## 2017-03-27 ENCOUNTER — Ambulatory Visit: Payer: Self-pay | Admitting: Cardiovascular Disease

## 2017-03-27 NOTE — Telephone Encounter (Signed)
Patient late arriving to ov today.    R/s appt for November 21 with Ryan    Pt c/o of Chest Pain: STAT if CP now or developed within 24 hours  1. Are you having CP right now? No   2. Are you experiencing any other symptoms (ex. SOB, nausea, vomiting, sweating)?   Headache  Pressure in chest sob during chest pain episodes nausea occasionally   3. How long have you been experiencing CP?   It has been an issue in the past less frequent than in the past but it is still a current issue and patient is worried   4. Is your CP continuous or coming and going?   Comes and goes   5. Have you taken Nitroglycerin?  No in the past patient has taken meds that open arteries and this helped but could not afford expense of refill     ?

## 2017-03-27 NOTE — Telephone Encounter (Signed)
Called pt's home number. Recording states number has been disconnected or is no longer in service.  Work number rang several times then rolled over to fax. Will call again.

## 2017-03-28 ENCOUNTER — Ambulatory Visit (INDEPENDENT_AMBULATORY_CARE_PROVIDER_SITE_OTHER): Payer: Self-pay | Admitting: Cardiovascular Disease

## 2017-03-28 ENCOUNTER — Encounter: Payer: Self-pay | Admitting: Cardiovascular Disease

## 2017-03-28 VITALS — BP 130/100 | HR 93 | Ht 65.0 in | Wt 196.2 lb

## 2017-03-28 DIAGNOSIS — I350 Nonrheumatic aortic (valve) stenosis: Secondary | ICD-10-CM

## 2017-03-28 DIAGNOSIS — I10 Essential (primary) hypertension: Secondary | ICD-10-CM

## 2017-03-28 DIAGNOSIS — E785 Hyperlipidemia, unspecified: Secondary | ICD-10-CM

## 2017-03-28 DIAGNOSIS — I25118 Atherosclerotic heart disease of native coronary artery with other forms of angina pectoris: Secondary | ICD-10-CM

## 2017-03-28 MED ORDER — METOPROLOL TARTRATE 50 MG PO TABS: 50.0000 mg | ORAL_TABLET | Freq: Two times a day (BID) | ORAL | 3 refills | Status: DC

## 2017-03-28 MED ORDER — AMLODIPINE BESYLATE 5 MG PO TABS: 5.0000 mg | ORAL_TABLET | Freq: Every day | ORAL | 3 refills | Status: DC

## 2017-03-28 NOTE — Progress Notes (Signed)
Cardiology Office Note   Date:  03/28/2017   ID:  James Yates, DOB 11/15/1955, MRN [id number]  PCP: None Cardiologist:   Lorine Bears, MD   Chief Complaint  Patient presents with  . other    6 month follow up. Meds reviewed by the pt. verbally. Pt. c/o chest pain and shortness of breath.       History of Present Illness: James Yates is a 61 y.o. male who presents for a followup visit regarding coronary artery disease status post CABG in February of 2015 after NSTEMI.  Other medical problems include hypertension, hyperlipidemia and bicuspid aortic valve with mild to moderate stenosis . Most recent echocardiogram in March 2018 showed normal LV systolic function with moderate aortic stenosis.  Peak gradient was 39 mmHg with mean gradient of 21 mmHg and valve area of 0.93.  Most recent cardiac catheterization in June 2017 showed severe underlying three-vessel coronary artery disease with patent LIMA to LAD and SVG to RCA. The SVG to the left circumflex was occluded. Native left circumflex was occluded proximally with faint collaterals. Aortic stenosis was moderate with peak to peak gradient of 22 mmHg .  I reviewed his cardiac catheterization with our CTO team and the opinion was that the left circumflex was not optimal for PCI.   He ran out of amlodipine, metoprolol and Ranexa about 3 months ago and he did not call us for refills.  He reports worsening exertional chest pain since then.  This is associated with shortness of breath.  He has been taking aspirin and atorvastatin.  Past Medical History:  Diagnosis Date  . Aortic valve disease    bicuspid aortic valve with mild stenosis  . Bronchitis   . Coronary artery disease    Non-ST elevation myocardial infarction cardiac catheterization showed significant three-vessel coronary artery disease. 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 Ejection fraction was  60%.  . Glucose intolerance (impaired glucose tolerance)   . Hyperlipidemia   . Hypertension   . Medical history non-contributory   . MI (myocardial infarction) (HCC)   . Obesity     Past Surgical History:  Procedure Laterality Date  . CARDIAC CATHETERIZATION  06/2013   [facility]  . CARDIAC CATHETERIZATION N/A 10/29/2015   Procedure: Left Heart Cath and Cors/Grafts Angiography;  Surgeon: Iran Ouch, MD;  Location: [facility] INVASIVE CV LAB;  Service: Cardiovascular;  Laterality: N/A;  . CORONARY ARTERY BYPASS GRAFT N/A 07/05/2013   Procedure: CORONARY ARTERY BYPASS GRAFTING (CABG);  Surgeon: [name], MD;  Location: [facility] OR;  Service: Open Heart Surgery;  Laterality: N/A;  . INTRAOPERATIVE TRANSESOPHAGEAL ECHOCARDIOGRAM N/A 07/05/2013   Procedure: INTRAOPERATIVE TRANSESOPHAGEAL ECHOCARDIOGRAM;  Surgeon: [name], MD;  Location: [facility] OR;  Service: Open Heart Surgery;  Laterality: N/A;  . NO PAST SURGERIES       Current Outpatient Prescriptions  Medication Sig Dispense Refill  . aspirin EC 81 MG tablet Take 1 tablet (81 mg total) by mouth daily. 30 tablet 3  . atorvastatin (LIPITOR) 80 MG tablet TAKE 1 TABLET (80 MG TOTAL) BY MOUTH DAILY AT 6 PM. 90 tablet 3   No current facility-administered medications for this visit.     Allergies:   Patient has no known allergies.    Social History:  The patient  reports that he has never smoked. He has never used smokeless tobacco. He reports that he  does not drink alcohol or use drugs.   Family History:  The patient's family history includes Heart attack in his father; Heart disease in his father.    ROS:  Please see the history of present illness.   Otherwise, review of systems are positive for none.   All other systems are reviewed and negative.    PHYSICAL EXAM: VS:  BP (!) 130/100 (BP Location: Left Arm, Patient Position: Sitting, Cuff Size: Normal)   Pulse 93   Ht 5\' 5"  (1.651 m)   Wt 196 lb 4 oz (89 kg)   BMI 32.66 kg/m  , BMI  Body mass index is 32.66 kg/m. GEN: Well nourished, well developed, in no acute distress  HEENT: normal  Neck: no JVD, carotid bruits, or masses Cardiac: RRR; no  rubs, or gallops,no edema . There is a 3/6 crescendo decrescendo aortic murmur which is mid peaking. Respiratory:  clear to auscultation bilaterally, normal work of breathing GI: soft, nontender, nondistended, + BS MS: no deformity or atrophy  Skin: warm and dry, no rash Neuro:  Strength and sensation are intact Psych: euthymic mood, full affect   EKG:  EKG is  ordered today. EKG done showed normal sinus rhythm with right bundle branch block and old inferior infarct   Recent Labs: 04/17/2016: BUN 17; Creatinine, Ser 1.06; Potassium 3.3; Sodium 138 08/18/2016: Hemoglobin 15.3; Platelets 153    Lipid Panel    Component Value Date/Time   CHOL 159 09/21/2015 1457   CHOL 215 (H) 07/02/2013 0058   TRIG 208 (H) 09/21/2015 1457   TRIG 143 07/02/2013 0058   HDL 39 (L) 09/21/2015 1457   HDL 38 (L) 07/02/2013 0058   CHOLHDL 4.1 09/21/2015 1457   VLDL 29 07/02/2013 0058   LDLCALC 78 09/21/2015 1457   LDLCALC 148 (H) 07/02/2013 0058      Wt Readings from Last 3 Encounters:  03/28/17 196 lb 4 oz (89 kg)  07/26/16 200 lb 4 oz (90.8 kg)  04/17/16 202 lb 6.1 oz (91.8 kg)        ASSESSMENT AND PLAN:  1.  Coronary artery disease involving native coronary arteries with stable angina:    His symptoms worsened off metoprolol and amlodipine.  I discussed with him the importance of taking these medications regularly to call us if he needs refills. I refilled amlodipine 5 mg once daily and resumed metoprolol at 50 mg twice daily. Continue low-dose aspirin and atorvastatin. I elected not to refill Ranexa which he could not afford.  2. Bicuspid aortic valve with  moderate stenosis.  Repeat echocardiogram in April 2009.  3. Essential hypertension: Blood pressure is elevated due to not taking amlodipine and metoprolol.  4.  Hyperlipidemia: Continue high dose atorvastatin.  Most recent LDL was 78.   Disposition:   FU with me in 6 months  Signed,  Lorine BearsMuhammad Arida, MD  03/28/2017 2:02 PM    [facility]

## 2017-03-28 NOTE — Telephone Encounter (Signed)
Pt had OV today with Dr. Kirke CorinArida.

## 2017-03-28 NOTE — Patient Instructions (Addendum)
Medication Instructions:  Your physician has recommended you make the following change in your medication:  START taking amlodipine 5mg  once daily START taking metoprolol 50mg  twice daily   Labwork: none  Testing/Procedures: Your physician has requested that you have an echocardiogram in April 2019. Echocardiography is a painless test that uses sound waves to create images of your heart. It provides your doctor with information about the size and shape of your heart and how well your heart's chambers and valves are working. This procedure takes approximately one hour. There are no restrictions for this procedure.    Follow-Up: Your physician wants you to follow-up in: 6 months with Dr. Kirke CorinArida.  You will receive a reminder letter in the mail two months in advance. If you don't receive a letter, please call our office to schedule the follow-up appointment.   Any Other Special Instructions Will Be Listed Below (If Applicable).     If you need a refill on your cardiac medications before your next appointment, please call your pharmacy.  Echocardiogram An echocardiogram, or echocardiography, uses sound waves (ultrasound) to produce an image of your heart. The echocardiogram is simple, painless, obtained within a short period of time, and offers valuable information to your health care provider. The images from an echocardiogram can provide information such as:  Evidence of coronary artery disease (CAD).  Heart size.  Heart muscle function.  Heart valve function.  Aneurysm detection.  Evidence of a past heart attack.  Fluid buildup around the heart.  Heart muscle thickening.  Assess heart valve function.  Tell a health care provider about:  Any allergies you have.  All medicines you are taking, including vitamins, herbs, eye drops, creams, and over-the-counter medicines.  Any problems you or family members have had with anesthetic medicines.  Any blood disorders you  have.  Any surgeries you have had.  Any medical conditions you have.  Whether you are pregnant or may be pregnant. What happens before the procedure? No special preparation is needed. Eat and drink normally. What happens during the procedure?  In order to produce an image of your heart, gel will be applied to your chest and a wand-like tool (transducer) will be moved over your chest. The gel will help transmit the sound waves from the transducer. The sound waves will harmlessly bounce off your heart to allow the heart images to be captured in real-time motion. These images will then be recorded.  You may need an IV to receive a medicine that improves the quality of the pictures. What happens after the procedure? You may return to your normal schedule including diet, activities, and medicines, unless your health care provider tells you otherwise. This information is not intended to replace advice given to you by your health care provider. Make sure you discuss any questions you have with your health care provider. Document Released: 05/13/2000 Document Revised: 01/02/2016 Document Reviewed: 01/21/2013 Elsevier Interactive Patient Education  2017 [employer]

## 2017-04-19 ENCOUNTER — Ambulatory Visit: Payer: Self-pay | Admitting: Physician Assistant

## 2017-07-07 ENCOUNTER — Other Ambulatory Visit: Payer: Self-pay | Admitting: Cardiovascular Disease

## 2017-10-10 ENCOUNTER — Other Ambulatory Visit: Payer: Self-pay

## 2017-10-10 ENCOUNTER — Ambulatory Visit (INDEPENDENT_AMBULATORY_CARE_PROVIDER_SITE_OTHER): Payer: BLUE CROSS/BLUE SHIELD

## 2017-10-10 DIAGNOSIS — I350 Nonrheumatic aortic (valve) stenosis: Secondary | ICD-10-CM | POA: Diagnosis not present

## 2017-10-10 MED ORDER — PERFLUTREN LIPID MICROSPHERE
1.0000 mL | INTRAVENOUS | Status: AC | PRN
  Administered 2017-10-10: 2 mL via INTRAVENOUS

## 2017-10-12 ENCOUNTER — Telehealth: Payer: Self-pay | Admitting: *Deleted

## 2017-10-12 NOTE — Telephone Encounter (Signed)
Attempted to reach patient at every number listed on chart, FYI and patient demographics. Numbers were not accurate. Unable to leave a message at home number. Tried last number for a relative named, James Yates, and was able to leave a message. Left message to please contact us as soon as possible to see if he can come in tomorrow for an appointment.

## 2017-10-12 NOTE — Telephone Encounter (Signed)
-----   Message from Iran Ouch, MD sent at 10/11/2017  4:27 PM EDT ----- Regarding: RE: Echo result I reviewed his echocardiogram which showed severe aortic stenosis.  Please schedule him to see me this Friday at 10:20 am to discuss options of management.  ----- Message ----- From: Edwena Blow Sent: 10/10/2017   1:46 PM To: Stann Mainland, RN, Iran Ouch, MD Subject: Echo result                                    Echo done with [facility] today. AV vels significantly higher, with clean AS envelopes on Doppler. 71/38 today vs 39/21 14 months ago. Patient is very concerned about increasing SOB with ADL.  GJ

## 2017-10-13 NOTE — Telephone Encounter (Signed)
Spoke with lady and she states that patient is at work. Advised that he needed appointment to come in and she states that she will have her daughter call. She repeated that her daughter would call back. She then hung up the phone.

## 2017-10-24 ENCOUNTER — Encounter: Payer: Self-pay | Admitting: *Deleted

## 2017-10-24 NOTE — Telephone Encounter (Signed)
Letter sent to interpreter services to translate so that we can mail him request to call and schedule follow up.

## 2017-10-25 ENCOUNTER — Encounter: Payer: Self-pay | Admitting: *Deleted

## 2017-10-25 NOTE — Telephone Encounter (Signed)
Spanish and [place] letters placed in outgoing mail for patient to call us and schedule appointment with provider to review results.

## 2017-11-08 NOTE — Telephone Encounter (Signed)
Spoke with patients daughter per release form and reviewed that Dr. Kirke CorinArida wanted to review results with patient and discuss other testing. She verbalized understanding and requested that I call her back in a couple of hours and provided me the number.

## 2017-11-08 NOTE — Telephone Encounter (Signed)
Spoke with patients daughter per release form and confirmed upcoming appointment scheduled for 11/23/17 at 3:40 PM. She verbalized understanding with no further questions at this time.

## 2017-11-09 NOTE — Telephone Encounter (Signed)
Okay, requested

## 2017-11-23 ENCOUNTER — Encounter: Payer: Self-pay | Admitting: Cardiovascular Disease

## 2017-11-23 ENCOUNTER — Ambulatory Visit: Payer: BLUE CROSS/BLUE SHIELD | Admitting: Cardiovascular Disease

## 2017-11-23 ENCOUNTER — Ambulatory Visit
Admission: RE | Admit: 2017-11-23 | Discharge: 2017-11-23 | Disposition: A | Discharge status: Home / Self Care | Payer: BLUE CROSS/BLUE SHIELD | Source: Ambulatory Visit | Attending: Cardiovascular Disease | Admitting: Cardiovascular Disease

## 2017-11-23 VITALS — BP 106/78 | HR 79 | Ht 65.0 in | Wt 191.5 lb

## 2017-11-23 DIAGNOSIS — Z01810 Encounter for preprocedural cardiovascular examination: Secondary | ICD-10-CM | POA: Diagnosis not present

## 2017-11-23 DIAGNOSIS — I25708 Atherosclerosis of coronary artery bypass graft(s), unspecified, with other forms of angina pectoris: Secondary | ICD-10-CM | POA: Diagnosis not present

## 2017-11-23 DIAGNOSIS — I10 Essential (primary) hypertension: Secondary | ICD-10-CM | POA: Diagnosis not present

## 2017-11-23 DIAGNOSIS — I350 Nonrheumatic aortic (valve) stenosis: Secondary | ICD-10-CM

## 2017-11-23 DIAGNOSIS — E785 Hyperlipidemia, unspecified: Secondary | ICD-10-CM

## 2017-11-23 NOTE — H&P (View-Only) (Signed)
  Cardiology Office Note   Date:  11/23/2017   ID:  James Yates, DOB 02/06/1956, MRN [id number]  PCP: None Cardiologist:   Muhammad Arida, MD   Chief Complaint  Patient presents with  . other    ABN testing c/o chest pain, sob and pt has to take breaks during walking. Meds reviewed verbally with pt.      History of Present Illness: James Yates is a 62 y.o. male who presents for a followup visit regarding coronary artery disease status post CABG in February of 2015 after NSTEMI.  Other medical problems include hypertension, hyperlipidemia and bicuspid aortic valve .  Most recent cardiac catheterization in June 2017 showed severe underlying three-vessel coronary artery disease with patent LIMA to LAD and SVG to RCA. The SVG to the left circumflex was occluded. Native left circumflex was occluded proximally with faint collaterals. Aortic stenosis was moderate with peak to peak gradient of 22 mmHg .  I reviewed his cardiac catheterization with our CTO team and the opinion was that the left circumflex was not optimal for PCI.   He had recent worsening of exertional chest tightness.  Symptoms usually responds to 5 minutes of rest and does not require nitroglycerin.  He had an echocardiogram done in May which was reviewed with him today.  It showed an EF of 60 to 65% with progression of aortic stenosis with a mean gradient of 38 mmHg and peak gradient of 71 mmHg.  Aortic valve area was 0.6 cm.  Systolic pulmonary pressure was 46 mmHg.  He has been taking his medications regularly.  Past Medical History:  Diagnosis Date  . Aortic valve disease    bicuspid aortic valve with mild stenosis  . Bronchitis   . Coronary artery disease    Non-ST elevation myocardial infarction cardiac catheterization showed significant three-vessel coronary artery disease. 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 Ejection fraction was  60%.  . Glucose intolerance (impaired glucose tolerance)   . Hyperlipidemia   . Hypertension   . Medical history non-contributory   . MI (myocardial infarction) (HCC)   . Obesity     Past Surgical History:  Procedure Laterality Date  . CARDIAC CATHETERIZATION  06/2013   [facility]  . CARDIAC CATHETERIZATION N/A 10/29/2015   Procedure: Left Heart Cath and Cors/Grafts Angiography;  Surgeon: Muhammad A Arida, MD;  Location: [facility] INVASIVE CV LAB;  Service: Cardiovascular;  Laterality: N/A;  . CORONARY ARTERY BYPASS GRAFT N/A 07/05/2013   Procedure: CORONARY ARTERY BYPASS GRAFTING (CABG);  Surgeon: Peter Van Trigt, MD;  Location: [facility] OR;  Service: Open Heart Surgery;  Laterality: N/A;  . INTRAOPERATIVE TRANSESOPHAGEAL ECHOCARDIOGRAM N/A 07/05/2013   Procedure: INTRAOPERATIVE TRANSESOPHAGEAL ECHOCARDIOGRAM;  Surgeon: Peter Van Trigt, MD;  Location: [facility] OR;  Service: Open Heart Surgery;  Laterality: N/A;  . NO PAST SURGERIES       Current Outpatient Medications  Medication Sig Dispense Refill  . aspirin EC 81 MG tablet Take 1 tablet (81 mg total) by mouth daily. 30 tablet 3  . atorvastatin (LIPITOR) 80 MG tablet TAKE 1 TABLET (80 MG TOTAL) BY MOUTH DAILY AT 6 PM. 90 tablet 3  . amLODipine (NORVASC) 5 MG tablet Take 1 tablet (5 mg total) by mouth daily. 90 tablet 3  . metoprolol tartrate (LOPRESSOR) 50 MG tablet Take 1 tablet (50 mg total) by mouth 2 (two) times daily. 180 tablet 3     No current facility-administered medications for this visit.     Allergies:   Patient has no known allergies.    Social History:  The patient  reports that he has never smoked. He has never used smokeless tobacco. He reports that he does not drink alcohol or use drugs.   Family History:  The patient's family history includes Heart attack in his father; Heart disease in his father.    ROS:  Please see the history of present illness.   Otherwise, review of systems are positive for none.   All other systems are reviewed  and negative.    PHYSICAL EXAM: VS:  BP 106/78 (BP Location: Left Arm, Patient Position: Sitting, Cuff Size: Normal)   Pulse 79   Ht 5' 5" (1.651 m)   Wt 191 lb 8 oz (86.9 kg)   BMI 31.87 kg/m  , BMI Body mass index is 31.87 kg/m. GEN: Well nourished, well developed, in no acute distress  HEENT: normal  Neck: no JVD, carotid bruits, or masses Cardiac: RRR; no  rubs, or gallops,no edema . There is a 3/6 crescendo decrescendo aortic murmur which is mid peaking. Respiratory:  clear to auscultation bilaterally, normal work of breathing GI: soft, nontender, nondistended, + BS MS: no deformity or atrophy  Skin: warm and dry, no rash Neuro:  Strength and sensation are intact Psych: euthymic mood, full affect   EKG:  EKG is  ordered today. EKG done showed normal sinus rhythm with right bundle branch block and old inferior infarct with inferior T wave changes.   Recent Labs: No results found for requested labs within last 8760 hours.    Lipid Panel    Component Value Date/Time   CHOL 159 09/21/2015 1457   CHOL 215 (H) 07/02/2013 0058   TRIG 208 (H) 09/21/2015 1457   TRIG 143 07/02/2013 0058   HDL 39 (L) 09/21/2015 1457   HDL 38 (L) 07/02/2013 0058   CHOLHDL 4.1 09/21/2015 1457   VLDL 29 07/02/2013 0058   LDLCALC 78 09/21/2015 1457   LDLCALC 148 (H) 07/02/2013 0058      Wt Readings from Last 3 Encounters:  11/23/17 191 lb 8 oz (86.9 kg)  03/28/17 196 lb 4 oz (89 kg)  07/26/16 200 lb 4 oz (90.8 kg)        ASSESSMENT AND PLAN:  1.  Symptomatic severe aortic stenosis with bicuspid aortic valve: The patient's symptoms have progressed as well as his severity of aortic stenosis on serial echocardiograms.  Due to that, I recommend evaluation for aortic valve replacement.  This is probably best done surgically given the patient's age, bicuspid morphology and the presence of coronary artery disease that requires likely at least one vessel CABG. I discussed this with him today  with an interpreter present.  I recommend proceeding with a right and left cardiac catheterization.  I discussed the procedure in details as well as risks and benefits.  We will plan on femoral access in order to monitor aortic valve gradient.  2. Coronary artery disease involving native coronary arteries with worsening angina:     Worsening symptoms are likely due to progression of aortic stenosis.  He also has known occluded left circumflex with occluded SVG to OM.  Recheck coronary anatomy with cath and if unchanged, the patient will likely require one-vessel redo CABG with aortic valve replacement.  3. Essential hypertension: Blood pressure is well controlled on current medications.  4. Hyperlipidemia: Continue high dose atorvastatin.     Disposition:     FU with me in 1 months  Signed,  Muhammad Arida, MD  11/23/2017 11:20 AM    [facility] 

## 2017-11-23 NOTE — Progress Notes (Signed)
Cardiology Office Note   Date:  11/23/2017   ID:  James Yates, DOB 08/30/1955, MRN [id number]  PCP: None Cardiologist:   Lorine Bears, MD   Chief Complaint  Patient presents with  . other    ABN testing c/o chest pain, sob and pt has to take breaks during walking. Meds reviewed verbally with pt.      History of Present Illness: James Yates is a 62 y.o. male who presents for a followup visit regarding coronary artery disease status post CABG in February of 2015 after NSTEMI.  Other medical problems include hypertension, hyperlipidemia and bicuspid aortic valve .  Most recent cardiac catheterization in June 2017 showed severe underlying three-vessel coronary artery disease with patent LIMA to LAD and SVG to RCA. The SVG to the left circumflex was occluded. Native left circumflex was occluded proximally with faint collaterals. Aortic stenosis was moderate with peak to peak gradient of 22 mmHg .  I reviewed his cardiac catheterization with our CTO team and the opinion was that the left circumflex was not optimal for PCI.   He had recent worsening of exertional chest tightness.  Symptoms usually responds to 5 minutes of rest and does not require nitroglycerin.  He had an echocardiogram done in May which was reviewed with him today.  It showed an EF of 60 to 65% with progression of aortic stenosis with a mean gradient of 38 mmHg and peak gradient of 71 mmHg.  Aortic valve area was 0.6 cm.  Systolic pulmonary pressure was 46 mmHg.  He has been taking his medications regularly.  Past Medical History:  Diagnosis Date  . Aortic valve disease    bicuspid aortic valve with mild stenosis  . Bronchitis   . Coronary artery disease    Non-ST elevation myocardial infarction cardiac catheterization showed significant three-vessel coronary artery disease. 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 Ejection fraction was  60%.  . Glucose intolerance (impaired glucose tolerance)   . Hyperlipidemia   . Hypertension   . Medical history non-contributory   . MI (myocardial infarction) (HCC)   . Obesity     Past Surgical History:  Procedure Laterality Date  . CARDIAC CATHETERIZATION  06/2013   [facility]  . CARDIAC CATHETERIZATION N/A 10/29/2015   Procedure: Left Heart Cath and Cors/Grafts Angiography;  Surgeon: Iran Ouch, MD;  Location: [facility] INVASIVE CV LAB;  Service: Cardiovascular;  Laterality: N/A;  . CORONARY ARTERY BYPASS GRAFT N/A 07/05/2013   Procedure: CORONARY ARTERY BYPASS GRAFTING (CABG);  Surgeon: [name], MD;  Location: [facility] OR;  Service: Open Heart Surgery;  Laterality: N/A;  . INTRAOPERATIVE TRANSESOPHAGEAL ECHOCARDIOGRAM N/A 07/05/2013   Procedure: INTRAOPERATIVE TRANSESOPHAGEAL ECHOCARDIOGRAM;  Surgeon: [name], MD;  Location: [facility] OR;  Service: Open Heart Surgery;  Laterality: N/A;  . NO PAST SURGERIES       Current Outpatient Medications  Medication Sig Dispense Refill  . aspirin EC 81 MG tablet Take 1 tablet (81 mg total) by mouth daily. 30 tablet 3  . atorvastatin (LIPITOR) 80 MG tablet TAKE 1 TABLET (80 MG TOTAL) BY MOUTH DAILY AT 6 PM. 90 tablet 3  . amLODipine (NORVASC) 5 MG tablet Take 1 tablet (5 mg total) by mouth daily. 90 tablet 3  . metoprolol tartrate (LOPRESSOR) 50 MG tablet Take 1 tablet (50 mg total) by mouth 2 (two) times daily. 180 tablet 3  No current facility-administered medications for this visit.     Allergies:   Patient has no known allergies.    Social History:  The patient  reports that he has never smoked. He has never used smokeless tobacco. He reports that he does not drink alcohol or use drugs.   Family History:  The patient's family history includes Heart attack in his father; Heart disease in his father.    ROS:  Please see the history of present illness.   Otherwise, review of systems are positive for none.   All other systems are reviewed  and negative.    PHYSICAL EXAM: VS:  BP 106/78 (BP Location: Left Arm, Patient Position: Sitting, Cuff Size: Normal)   Pulse 79   Ht 5\' 5"  (1.651 m)   Wt 191 lb 8 oz (86.9 kg)   BMI 31.87 kg/m  , BMI Body mass index is 31.87 kg/m. GEN: Well nourished, well developed, in no acute distress  HEENT: normal  Neck: no JVD, carotid bruits, or masses Cardiac: RRR; no  rubs, or gallops,no edema . There is a 3/6 crescendo decrescendo aortic murmur which is mid peaking. Respiratory:  clear to auscultation bilaterally, normal work of breathing GI: soft, nontender, nondistended, + BS MS: no deformity or atrophy  Skin: warm and dry, no rash Neuro:  Strength and sensation are intact Psych: euthymic mood, full affect   EKG:  EKG is  ordered today. EKG done showed normal sinus rhythm with right bundle branch block and old inferior infarct with inferior T wave changes.   Recent Labs: No results found for requested labs within last 8760 hours.    Lipid Panel    Component Value Date/Time   CHOL 159 09/21/2015 1457   CHOL 215 (H) 07/02/2013 0058   TRIG 208 (H) 09/21/2015 1457   TRIG 143 07/02/2013 0058   HDL 39 (L) 09/21/2015 1457   HDL 38 (L) 07/02/2013 0058   CHOLHDL 4.1 09/21/2015 1457   VLDL 29 07/02/2013 0058   LDLCALC 78 09/21/2015 1457   LDLCALC 148 (H) 07/02/2013 0058      Wt Readings from Last 3 Encounters:  11/23/17 191 lb 8 oz (86.9 kg)  03/28/17 196 lb 4 oz (89 kg)  07/26/16 200 lb 4 oz (90.8 kg)        ASSESSMENT AND PLAN:  1.  Symptomatic severe aortic stenosis with bicuspid aortic valve: The patient's symptoms have progressed as well as his severity of aortic stenosis on serial echocardiograms.  Due to that, I recommend evaluation for aortic valve replacement.  This is probably best done surgically given the patient's age, bicuspid morphology and the presence of coronary artery disease that requires likely at least one vessel CABG. I discussed this with him today  with an interpreter present.  I recommend proceeding with a right and left cardiac catheterization.  I discussed the procedure in details as well as risks and benefits.  We will plan on femoral access in order to monitor aortic valve gradient.  2. Coronary artery disease involving native coronary arteries with worsening angina:     Worsening symptoms are likely due to progression of aortic stenosis.  He also has known occluded left circumflex with occluded SVG to OM.  Recheck coronary anatomy with cath and if unchanged, the patient will likely require one-vessel redo CABG with aortic valve replacement.  3. Essential hypertension: Blood pressure is well controlled on current medications.  4. Hyperlipidemia: Continue high dose atorvastatin.     Disposition:  FU with me in 1 months  Signed,  Lorine Bears, MD  11/23/2017 11:20 AM    [facility]

## 2017-11-23 NOTE — Patient Instructions (Signed)
Medication Instructions:  Your physician recommends that you continue on your current medications as directed. Please refer to the Current Medication list given to you today.   Labwork: Your physician recommends that you return for lab work in: TODAY (BMET, CBC).   Testing/Procedures: A chest x-ray takes a picture of the organs and structures inside the chest, including the heart, lungs, and blood vessels. This test can show several things, including, whether the heart is enlarges; whether fluid is building up in the lungs; and whether pacemaker / defibrillator leads are still in place. -Please go to the [facility]. You will check in at the front desk to the right as you walk into the atrium. Valet Parking is offered if needed.    Your physician has requested that you have a cardiac catheterization. Cardiac catheterization is used to diagnose and/or treat various heart conditions. Doctors may recommend this procedure for a number of different reasons. The most common reason is to evaluate chest pain. Chest pain can be a symptom of coronary artery disease (CAD), and cardiac catheterization can show whether plaque is narrowing or blocking your heart's arteries. This procedure is also used to evaluate the valves, as well as measure the blood flow and oxygen levels in different parts of your heart. For further information please visit [email or web address] Please follow instruction sheet, as given.  [facility] Cardiac Cath Instructions   You are scheduled for a RIGHT AND LEFT Cardiac Cath      on:___07/01/2019________  Please arrive at _09:30__am on the day of your procedure  Please expect a call from our [facility] to pre-register you  Do not eat/drink anything after midnight  Someone will need to drive you home  It is recommended someone be with you for the first 24 hours after your procedure  Wear clothes that are easy to get on/off and wear slip on shoes if  possible   Medications bring a current list of all medications with you  _YES__ You may take all of your medications the morning of your procedure with enough water to swallow safely   Day of your procedure: Arrive at the [facility] entrance.  Free valet service is available.  After entering the [facility] please check-in at the registration desk (1st desk on your right) to receive your armband. After receiving your armband someone will escort you to the cardiac cath/special procedures waiting area.  The usual length of stay after your procedure is about 2 to 3 hours.  This can vary.  If you have any questions, please call our office at [phone], or you may call the cardiac cath lab at [facility] directly at [phone]    Follow-Up: Your physician recommends that you schedule a follow-up appointment in: TO BE DETERMINED.  If you need a refill on your cardiac medications before your next appointment, please call your pharmacy.    Angiografa coronaria con colocacin de stent Coronary Angiogram With Stent La angiografa coronaria con colocacin de stent es un procedimiento para ensanchar o abrir un vaso sanguneo del corazn (arteria coronaria) que se ha Therapist, nutritional. Las arterias pueden bloquearse por acumulacin de colesterol (placas) en el revestimiento interior o las paredes. Cuando una arteria coronaria se obstruye parcialmente, el flujo sanguneo disminuye en esa zona. Esto puede causar dolor en el pecho o un ataque cardaco (infarto de miocardio). Un stent es una pequea pieza de metal similar a [place] o a un resorte. La colocacin del stent puede hacerse como tratamiento por  un infarto de miocardio o inmediatamente despus de una angiografa coronaria en la que se encuentra una arteria obstruida. Informe a su mdico acerca de lo siguiente:  Cualquier alergia que tenga.  Todos los [employer], incluidos vitaminas, hierbas, gotas oftlmicas, cremas y [address]  de [address].  Cualquier problema previo que usted o los miembros de su familia hayan tenido con anestsicos.  Enfermedades de la sangre que tenga.  Cirugas previas.  Cualquier enfermedad que tenga.  Si est embarazada o podra estarlo. Cules son los riesgos? En general, se trata de un procedimiento seguro. Sin embargo, pueden ocurrir complicaciones, por ejemplo:  Lesiones en el corazn o en los vasos sanguneos.  Que la arteria vuelva a obstruirse.  Sangrado, infeccin o hematomas en el lugar de la insercin.  Una acumulacin de sangre debajo de la piel (hematoma) en el lugar de la insercin.  Un cogulo de sangre en otra parte del organismo.  Lesin renal.  Reaccin alrgica a la sustancia de [place].  Sangrado en el abdomen (sangrado retroperitoneal).  Qu ocurre antes del procedimiento? Mantenerse hidratado Siga las indicaciones del mdico acerca de la hidratacin, que pueden incluir:  Hasta 2horas antes del procedimiento, puede beber lquidos transparentes, como agua, jugos frutales transparentes, caf negro y t solo.  Restricciones en las comidas y [address] Siga las indicaciones del mdico respecto de las comidas y las bebidas, las cuales pueden incluir lo siguiente:  Ocho horas antes del procedimiento, deje de ingerir comidas o alimentos pesados, por ejemplo, carne, alimentos fritos o alimentos grasos.  Seis horas antes del procedimiento, deje de ingerir comidas o alimentos livianos, como tostadas o cereales.  Dos horas antes del procedimiento, deje de beber lquidos transparentes.  Consulte al mdico si debe hacer o no lo siguiente:  Cambiar o suspender los medicamentos que toma habitualmente. Esto es muy importante si toma medicamentos para la diabetes o anticoagulantes.  Tomar medicamentos, como ibuprofeno. Estos medicamentos pueden tener un efecto anticoagulante en la [place]. No tome estos medicamentos antes del procedimiento si el mdico  le indica que no lo haga. Por lo general, la aspirina se recomienda antes de un procedimiento que Media planner pasar un tubo pequeo y delgado (catter) a travs de un vaso sanguneo hasta llegar al corazn (cateterismo cardaco).  Qu ocurre durante el procedimiento?  Le colocarn un tubo (catter) intravenoso en una de las venas.  Le administrarn uno o ms de los siguientes medicamentos: ? Un medicamento para ayudarlo a relajarse (sedante). ? Un medicamento para adormecer la zona en la que se insertar el catter (anestesia local).  Para reducir el riesgo de infecciones: ? El equipo mdico se lavar o se desinfectar las manos. ? Le lavarn la piel con jabn. ? Pueden rasurarle la zona donde se insertar el catter.  El catter se insertar en una arteria con un alambre gua. El Environmental consultant de insercin puede ser en la ingle, en la [place] o en el pliegue del brazo (cerca del codo).  Se utilizar un tipo de radiografa (fluoroscopia) para guiar el catter [employer] abertura de las arterias del corazn.  Se inyectar una sustancia de contraste por el catter, y se tomarn radiografas. La sustancia de contraste mostrar si hay estrechamientos u obstrucciones en las arterias.  Se guiar un alambre pequeo hasta la zona obstruida y se inflar un baln para ensanchar la arteria.  El stent se expandir y presionar la placa contra la pared del vaso sanguneo. El stent mantendr la zona [place] y  mejorar el flujo sanguneo. Katha HammingLa mayora de los stents estn revestidos con un frmaco para reducir Nurse, adultel riesgo de estrechamiento del stent con el paso del tiempo.  La arteria puede ensancharse usando un taladro quirrgico, un rayo lser u otras herramientas para extraer placas.  Cuando el flujo sanguneo mejore, el catter se Oceanographerretirar. El revestimiento interno de la arteria crecer sobre el stent, que Geneticist, molecularpermanecer en el lugar que fue colocado. Este procedimiento puede variar segn el mdico y el  hospital. Ladell HeadsQu ocurre despus del procedimiento?  Si el procedimiento se realiza a travs de la pierna, deber Investment banker, corporatepermanecer acostado en la cama durante aproximadamente 6 horas. Le indicarn que no flexione ni cruce las piernas.  El lugar de la insercin ser controlado con frecuencia.  Le controlarn con frecuencia el pulso en el pie o en la [place].  Le pueden realizar anlisis de Hissopsangre adicionales, radiografas y un estudio que registra la actividad elctrica del corazn (electrocardiograma o ECG). Esta informacin no tiene Theme park managercomo fin reemplazar el consejo del mdico. Asegrese de hacerle al mdico cualquier pregunta que tenga. Document Released: 08/31/2006 Document Revised: 08/17/2016 Document Reviewed: 12/20/2015 Elsevier Interactive Patient Education  [employer]2018 Elsevier Inc.

## 2017-11-24 LAB — CBC WITH DIFFERENTIAL/PLATELET
BASOS ABS: 0 10*3/uL (ref 0.0–0.2)
BASOS: 0 %
EOS (ABSOLUTE): 0.1 10*3/uL (ref 0.0–0.4)
EOS: 2 %
HEMOGLOBIN: 15.8 g/dL (ref 13.0–17.7)
Hematocrit: 45.7 % (ref 37.5–51.0)
IMMATURE GRANS (ABS): 0 10*3/uL (ref 0.0–0.1)
Immature Granulocytes: 0 %
Lymphocytes Absolute: 1.2 10*3/uL (ref 0.7–3.1)
Lymphs: 21 %
MCH: 29.6 pg (ref 26.6–33.0)
MCHC: 34.6 g/dL (ref 31.5–35.7)
MCV: 86 fL (ref 79–97)
MONOCYTES: 5 %
Monocytes Absolute: 0.3 10*3/uL (ref 0.1–0.9)
NEUTROS ABS: 4.2 10*3/uL (ref 1.4–7.0)
Neutrophils: 72 %
Platelets: 187 10*3/uL (ref 150–450)
RBC: 5.33 x10E6/uL (ref 4.14–5.80)
RDW: 13.9 % (ref 12.3–15.4)
WBC: 5.9 10*3/uL (ref 3.4–10.8)

## 2017-11-24 LAB — BASIC METABOLIC PANEL
BUN / CREAT RATIO: 11 (ref 10–24)
BUN: 10 mg/dL (ref 8–27)
CO2: 23 mmol/L (ref 20–29)
CREATININE: 0.94 mg/dL (ref 0.76–1.27)
Calcium: 8.9 mg/dL (ref 8.6–10.2)
Chloride: 104 mmol/L (ref 96–106)
GFR calc Af Amer: 101 mL/min/{1.73_m2} (ref >59)
GFR calc non Af Amer: 87 mL/min/{1.73_m2} (ref >59)
GLUCOSE: 101 mg/dL — AB (ref 65–99)
Potassium: 4.1 mmol/L (ref 3.5–5.2)
Sodium: 142 mmol/L (ref 134–144)

## 2017-11-27 ENCOUNTER — Encounter
Admission: RE | Disposition: A | Discharge status: Home / Self Care | Payer: Self-pay | Source: Ambulatory Visit | Attending: Cardiovascular Disease

## 2017-11-27 ENCOUNTER — Ambulatory Visit
Admission: RE | Admit: 2017-11-27 | Discharge: 2017-11-27 | Disposition: A | Discharge status: Home / Self Care | Payer: BLUE CROSS/BLUE SHIELD | Source: Ambulatory Visit | Attending: Cardiovascular Disease | Admitting: Cardiovascular Disease

## 2017-11-27 ENCOUNTER — Encounter: Payer: Self-pay | Admitting: Cardiovascular Disease

## 2017-11-27 DIAGNOSIS — I10 Essential (primary) hypertension: Secondary | ICD-10-CM | POA: Diagnosis not present

## 2017-11-27 DIAGNOSIS — E669 Obesity, unspecified: Secondary | ICD-10-CM | POA: Diagnosis not present

## 2017-11-27 DIAGNOSIS — Z6831 Body mass index (BMI) 31.0-31.9, adult: Secondary | ICD-10-CM | POA: Diagnosis not present

## 2017-11-27 DIAGNOSIS — Z79899 Other long term (current) drug therapy: Secondary | ICD-10-CM | POA: Diagnosis not present

## 2017-11-27 DIAGNOSIS — Z7982 Long term (current) use of aspirin: Secondary | ICD-10-CM | POA: Insufficient documentation

## 2017-11-27 DIAGNOSIS — Z9889 Other specified postprocedural states: Secondary | ICD-10-CM | POA: Insufficient documentation

## 2017-11-27 DIAGNOSIS — Z8249 Family history of ischemic heart disease and other diseases of the circulatory system: Secondary | ICD-10-CM | POA: Diagnosis not present

## 2017-11-27 DIAGNOSIS — I272 Pulmonary hypertension, unspecified: Secondary | ICD-10-CM | POA: Insufficient documentation

## 2017-11-27 DIAGNOSIS — I252 Old myocardial infarction: Secondary | ICD-10-CM | POA: Diagnosis not present

## 2017-11-27 DIAGNOSIS — R0602 Shortness of breath: Secondary | ICD-10-CM | POA: Diagnosis not present

## 2017-11-27 DIAGNOSIS — I352 Nonrheumatic aortic (valve) stenosis with insufficiency: Secondary | ICD-10-CM | POA: Insufficient documentation

## 2017-11-27 DIAGNOSIS — E785 Hyperlipidemia, unspecified: Secondary | ICD-10-CM | POA: Insufficient documentation

## 2017-11-27 DIAGNOSIS — I25810 Atherosclerosis of coronary artery bypass graft(s) without angina pectoris: Secondary | ICD-10-CM | POA: Diagnosis not present

## 2017-11-27 DIAGNOSIS — I350 Nonrheumatic aortic (valve) stenosis: Secondary | ICD-10-CM | POA: Diagnosis not present

## 2017-11-27 HISTORY — PX: RIGHT/LEFT HEART CATH AND CORONARY ANGIOGRAPHY: CATH118266

## 2017-11-27 SURGERY — RIGHT/LEFT HEART CATH AND CORONARY ANGIOGRAPHY
Anesthesia: Moderate Sedation | Laterality: Bilateral

## 2017-11-27 MED ORDER — IOPAMIDOL (ISOVUE-300) INJECTION 61%
INTRAVENOUS | Status: DC | PRN
  Administered 2017-11-27: 130 mL via INTRA_ARTERIAL

## 2017-11-27 MED ORDER — SODIUM CHLORIDE 0.9% FLUSH: 3.0000 mL | Freq: Two times a day (BID) | INTRAVENOUS | Status: DC

## 2017-11-27 MED ORDER — FENTANYL CITRATE (PF) 100 MCG/2ML IJ SOLN
INTRAMUSCULAR | Status: AC
  Filled 2017-11-27: qty 2

## 2017-11-27 MED ORDER — SODIUM CHLORIDE 0.9 % IV SOLN: INTRAVENOUS | Status: DC

## 2017-11-27 MED ORDER — SODIUM CHLORIDE 0.9 % IV SOLN
INTRAVENOUS | Status: DC
  Administered 2017-11-27: 10:00:00 via INTRAVENOUS

## 2017-11-27 MED ORDER — ASPIRIN 81 MG PO CHEW
CHEWABLE_TABLET | ORAL | Status: AC
  Administered 2017-11-27: 81 mg
  Filled 2017-11-27: qty 1

## 2017-11-27 MED ORDER — SODIUM CHLORIDE 0.9% FLUSH: 3.0000 mL | INTRAVENOUS | Status: DC | PRN

## 2017-11-27 MED ORDER — HEPARIN (PORCINE) IN NACL 1000-0.9 UT/500ML-% IV SOLN
INTRAVENOUS | Status: AC
  Filled 2017-11-27: qty 1000

## 2017-11-27 MED ORDER — SODIUM CHLORIDE 0.9 % IV SOLN: 250.0000 mL | INTRAVENOUS | Status: DC | PRN

## 2017-11-27 MED ORDER — MIDAZOLAM HCL 2 MG/2ML IJ SOLN
INTRAMUSCULAR | Status: AC
  Filled 2017-11-27: qty 2

## 2017-11-27 MED ORDER — LIDOCAINE HCL (PF) 1 % IJ SOLN
INTRAMUSCULAR | Status: AC
  Filled 2017-11-27: qty 30

## 2017-11-27 MED ORDER — MIDAZOLAM HCL 2 MG/2ML IJ SOLN
INTRAMUSCULAR | Status: DC | PRN
  Administered 2017-11-27: 1 mg via INTRAVENOUS

## 2017-11-27 MED ORDER — FENTANYL CITRATE (PF) 100 MCG/2ML IJ SOLN
INTRAMUSCULAR | Status: DC | PRN
  Administered 2017-11-27: 25 ug via INTRAVENOUS

## 2017-11-27 MED ORDER — ONDANSETRON HCL 4 MG/2ML IJ SOLN: 4.0000 mg | Freq: Four times a day (QID) | INTRAMUSCULAR | Status: DC | PRN

## 2017-11-27 MED ORDER — ASPIRIN 81 MG PO CHEW
81.0000 mg | CHEWABLE_TABLET | ORAL | Status: AC
  Administered 2017-11-27: 81 mg via ORAL

## 2017-11-27 MED ORDER — ACETAMINOPHEN 325 MG PO TABS: 650.0000 mg | ORAL_TABLET | ORAL | Status: DC | PRN

## 2017-11-27 SURGICAL SUPPLY — 14 items
CATH INFINITI 5 FR MPA2 (CATHETERS) ×2 IMPLANT
CATH INFINITI 5FR ANG PIGTAIL (CATHETERS) ×2 IMPLANT
CATH INFINITI 5FR JL4 (CATHETERS) ×2 IMPLANT
CATH INFINITI JR4 5F (CATHETERS) ×2 IMPLANT
CATH SWANZ 7F THERMO (CATHETERS) ×2 IMPLANT
DEVICE CLOSURE MYNXGRIP 5F (Vascular Products) ×2 IMPLANT
GUIDEWIRE EMER 3M J .025X150CM (WIRE) ×2 IMPLANT
KIT MANI 3VAL PERCEP (MISCELLANEOUS) ×2 IMPLANT
KIT RIGHT HEART (MISCELLANEOUS) ×2 IMPLANT
NEEDLE PERC 18GX7CM (NEEDLE) ×2 IMPLANT
PACK CARDIAC CATH (CUSTOM PROCEDURE TRAY) ×2 IMPLANT
SHEATH AVANTI 5FR X 11CM (SHEATH) ×2 IMPLANT
SHEATH AVANTI 7FRX11 (SHEATH) ×2 IMPLANT
WIRE GUIDERIGHT .035X150 (WIRE) ×2 IMPLANT

## 2017-11-27 NOTE — Discharge Instructions (Signed)
°Moderate Conscious Sedation, Adult, Care After °These instructions provide you with information about caring for yourself after your procedure. Your health care provider may also give you more specific instructions. Your treatment has been planned according to current medical practices, but problems sometimes occur. Call your health care provider if you have any problems or questions after your procedure. °What can I expect after the procedure? °After your procedure, it is common: °· To feel sleepy for several hours. °· To feel clumsy and have poor balance for several hours. °· To have poor judgment for several hours. °· To vomit if you eat too soon. ° °Follow these instructions at home: °For at least 24 hours after the procedure: ° °· Do not: °? Participate in activities where you could fall or become injured. °? Drive. °? Use heavy machinery. °? Drink alcohol. °? Take sleeping pills or medicines that cause drowsiness. °? Make important decisions or sign legal documents. °? Take care of children on your own. °· Rest. °Eating and drinking °· Follow the diet recommended by your health care provider. °· If you vomit: °? Drink water, juice, or soup when you can drink without vomiting. °? Make sure you have little or no nausea before eating solid foods. °General instructions °· Have a responsible adult stay with you until you are awake and alert. °· Take over-the-counter and prescription medicines only as told by your health care provider. °· If you smoke, do not smoke without supervision. °· Keep all follow-up visits as told by your health care provider. This is important. °Contact a health care provider if: °· You keep feeling nauseous or you keep vomiting. °· You feel light-headed. °· You develop a rash. °· You have a fever. °Get help right away if: °· You have trouble breathing. °This information is not intended to replace advice given to you by your health care provider. Make sure you discuss any questions you  have with your health care provider. °Document Released: 03/06/2013 Document Revised: 10/19/2015 Document Reviewed: 09/05/2015 °Elsevier Interactive Patient Education © 2018 Elsevier Inc. ° ° ° °Femoral Site Care °Refer to this sheet in the next few weeks. These instructions provide you with information about caring for yourself after your procedure. Your health care provider may also give you more specific instructions. Your treatment has been planned according to current medical practices, but problems sometimes occur. Call your health care provider if you have any problems or questions after your procedure. °What can I expect after the procedure? °After your procedure, it is typical to have the following: °· Bruising at the site that usually fades within 1-2 weeks. °· Blood collecting in the tissue (hematoma) that may be painful to the touch. It should usually decrease in size and tenderness within 1-2 weeks. ° °Follow these instructions at home: °· Take medicines only as directed by your health care provider. °· You may shower 24-48 hours after the procedure or as directed by your health care provider. Remove the bandage (dressing) and gently wash the site with plain soap and water. Pat the area dry with a clean towel. Do not rub the site, because this may cause bleeding. °· Do not take baths, swim, or use a hot tub until your health care provider approves. °· Check your insertion site every day for redness, swelling, or drainage. °· Do not apply powder or lotion to the site. °· Limit use of stairs to twice a day for the first 2-3 days or as directed by your health care   provider. °· Do not squat for the first 2-3 days or as directed by your health care provider. °· Do not lift over 10 lb (4.5 kg) for 5 days after your procedure or as directed by your health care provider. °· Ask your health care provider when it is okay to: °? Return to work or school. °? Resume usual physical activities or sports. °? Resume  sexual activity. °· Do not drive home if you are discharged the same day as the procedure. Have someone else drive you. °· You may drive 24 hours after the procedure unless otherwise instructed by your health care provider. °· Do not operate machinery or power tools for 24 hours after the procedure or as directed by your health care provider. °· If your procedure was done as an outpatient procedure, which means that you went home the same day as your procedure, a responsible adult should be with you for the first 24 hours after you arrive home. °· Keep all follow-up visits as directed by your health care provider. This is important. °Contact a health care provider if: °· You have a fever. °· You have chills. °· You have increased bleeding from the site. Hold pressure on the site. °Get help right away if: °· You have unusual pain at the site. °· You have redness, warmth, or swelling at the site. °· You have drainage (other than a small amount of blood on the dressing) from the site. °· The site is bleeding, and the bleeding does not stop after 30 minutes of holding steady pressure on the site. °· Your leg or foot becomes pale, cool, tingly, or numb. °This information is not intended to replace advice given to you by your health care provider. Make sure you discuss any questions you have with your health care provider. °Document Released: 01/17/2014 Document Revised: 10/22/2015 Document Reviewed: 12/03/2013 °Elsevier Interactive Patient Education © 2018 Elsevier Inc. ° °

## 2017-11-27 NOTE — Progress Notes (Signed)
Dr. Kirke CorinArida at bedside . MD spoke with pt. & family re: cath results. All verbalize understanding.

## 2017-11-27 NOTE — Interval H&P Note (Signed)
History and Physical Interval Note:  11/27/2017 11:29 AM  James Yates  has presented today for surgery, with the diagnosis of RT and LT Cath   Aortic stenosis  The various methods of treatment have been discussed with the patient and family. After consideration of risks, benefits and other options for treatment, the patient has consented to  Procedure(s): RIGHT/LEFT HEART CATH AND CORONARY ANGIOGRAPHY (Bilateral) as a surgical intervention .  The patient's history has been reviewed, patient examined, no change in status, stable for surgery.  I have reviewed the patient's chart and labs.  Questions were answered to the patient's satisfaction.     Lorine BearsMuhammad Arida

## 2017-12-06 ENCOUNTER — Other Ambulatory Visit: Payer: Self-pay | Admitting: *Deleted

## 2017-12-06 ENCOUNTER — Encounter: Payer: Self-pay | Admitting: Cardiothoracic Surgery

## 2017-12-06 ENCOUNTER — Other Ambulatory Visit: Payer: Self-pay

## 2017-12-06 ENCOUNTER — Institutional Professional Consult (permissible substitution): Payer: BLUE CROSS/BLUE SHIELD | Admitting: Cardiothoracic Surgery

## 2017-12-06 VITALS — BP 136/84 | HR 76 | Resp 16 | Ht 65.0 in | Wt 191.0 lb

## 2017-12-06 DIAGNOSIS — I25709 Atherosclerosis of coronary artery bypass graft(s), unspecified, with unspecified angina pectoris: Secondary | ICD-10-CM | POA: Diagnosis not present

## 2017-12-06 DIAGNOSIS — I2510 Atherosclerotic heart disease of native coronary artery without angina pectoris: Secondary | ICD-10-CM

## 2017-12-06 DIAGNOSIS — I350 Nonrheumatic aortic (valve) stenosis: Secondary | ICD-10-CM

## 2017-12-06 DIAGNOSIS — Z951 Presence of aortocoronary bypass graft: Secondary | ICD-10-CM

## 2017-12-06 NOTE — Progress Notes (Signed)
PCP is Glori Luis, MD Referring Provider is Iran Ouch, MD  Chief Complaint  Patient presents with  . Coronary Artery Disease    hx of CABG X 4.Marland KitchenMarland Kitchen2015...Marland KitchenCATH 11/27/17  . Aortic Stenosis    SEVERE......ECHO 10/10/17    HPI: Patient examined, images of coronary angiogram and echocardiogram personally reviewed and counseled with patient.  I also reviewed the patient's recent right heart cath data and past medical history of urgent CABG times 03 July 2013.  Patient has symptoms of exertional chest pressure relieved by rest.  No resting symptoms of orthopnea.  No symptoms of presyncope or syncope.  No palpitations.  Patient had mild bicuspid aortic stenosis at the time of his urgent CABG.  He has been followed carefully by Dr. Kirke Corin with serial echocardiograms.  He now has significant moderate-severe aortic stenosis with mean gradient 26 mmHg.  Bypass grafts to the LAD and RCA are patent.  The sequential bypass graft of the OM vessels is patent but there is a proximal lesion to the first graft.  Overall LVEF is normal.  PA pressures are minimally elevated.  LVEDP is normal.  Patient is in sinus rhythm.  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  Other than his heart disease he has been stable.  No prior surgical procedures other than CABG.  He tolerated the anesthesia and CABG without difficulty.  No bleeding or blood transfusions.  He denies any active dental complaints.  He has been seen by his personal dentist within the past year.  He is willing to have reexam prior to any cardiac valve procedur  Patient is referred today for consideration of redo sternotomy, surgical AVR and redo CABG to the first circumflex circumflex marginal.  His cardiologist did not feel the patient be candidate for TAVR because of the bicuspid configuration of the aortic valve.  He would not be candidate for PCI of his recurrent CAD.  Past Medical History:  Diagnosis Date  . Aortic  valve disease    bicuspid aortic valve with mild stenosis  . Bronchitis   . Coronary artery disease    Non-ST elevation myocardial infarction cardiac catheterization showed significant three-vessel coronary artery disease. 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 Ejection fraction was 60%.  . Glucose intolerance (impaired glucose tolerance)   . Hyperlipidemia   . Hypertension   . Medical history non-contributory   . MI (myocardial infarction) (HCC)   . Obesity     Past Surgical History:  Procedure Laterality Date  . CARDIAC CATHETERIZATION  06/2013   [facility]  . CARDIAC CATHETERIZATION N/A 10/29/2015   Procedure: Left Heart Cath and Cors/Grafts Angiography;  Surgeon: Iran Ouch, MD;  Location: [facility] INVASIVE CV LAB;  Service: Cardiovascular;  Laterality: N/A;  . CORONARY ARTERY BYPASS GRAFT N/A 07/05/2013   Procedure: CORONARY ARTERY BYPASS GRAFTING (CABG);  Surgeon: [name], MD;  Location: [facility] OR;  Service: Open Heart Surgery;  Laterality: N/A;  . INTRAOPERATIVE TRANSESOPHAGEAL ECHOCARDIOGRAM N/A 07/05/2013   Procedure: INTRAOPERATIVE TRANSESOPHAGEAL ECHOCARDIOGRAM;  Surgeon: [name], MD;  Location: [facility];  Service: Open Heart Surgery;  Laterality: N/A;  . NO PAST SURGERIES    . RIGHT/LEFT HEART CATH AND CORONARY ANGIOGRAPHY Bilateral 11/27/2017   Procedure: RIGHT/LEFT HEART CATH AND CORONARY ANGIOGRAPHY;  Surgeon: Iran Ouch, MD;  Location: [facility] INVASIVE CV LAB;  Service: Cardiovascular;  Laterality: Bilateral;  Family History  Problem Relation Age of Onset  . Heart disease Father   . Heart attack Father     Social History Social History   Tobacco Use  . Smoking status: Never Smoker  . Smokeless tobacco: Never Used  Substance Use Topics  . Alcohol use: No  . Drug use: No    Current Outpatient Medications  Medication Sig Dispense Refill  . amLODipine (NORVASC) 5 MG tablet Take 1 tablet  (5 mg total) by mouth daily. 90 tablet 3  . aspirin EC 81 MG tablet Take 1 tablet (81 mg total) by mouth daily. 30 tablet 3  . atorvastatin (LIPITOR) 80 MG tablet TAKE 1 TABLET (80 MG TOTAL) BY MOUTH DAILY AT 6 PM. 90 tablet 3  . metoprolol tartrate (LOPRESSOR) 50 MG tablet Take 1 tablet (50 mg total) by mouth 2 (two) times daily. 180 tablet 3   No current facility-administered medications for this visit.     No Known Allergies  Review of Systems                    Review of Systems :  [ y ] = yes, [  ] = no        General :  Weight gain [   ]    Weight loss  [ y intentional weight loss on a keto diet]  Fatigue [  ]  Fever [  ]  Chills  [  ]                                          HEENT    Headache [  ]  Dizziness [  ]  Blurred vision [  ] Glaucoma  [  ]                          Nosebleeds [  ] Painful or loose teeth [  ]        Cardiac :  Chest pain/ pressure [  y]  Resting SOB [  ] exertional SOB [  ]                        Orthopnea [  ]  Pedal edema  [  ]  Palpitations [  ] Syncope/presyncope [ ]                         Paroxysmal nocturnal dyspnea [  ]         Pulmonary : cough [  ]  wheezing [  ]  Hemoptysis [  ] Sputum [  ] Snoring [  ] no history of thoracic trauma rib fracture or pneumothorax.  Previous sternotomy incision healed well without infection                              Pneumothorax [  ]  Sleep apnea [  ]        GI : Vomiting [  ]  Dysphagia [  ]  Melena  [  ]  Abdominal pain [  ] BRBPR [  ]              Heart burn [  ]  Constipation [  ] Diarrhea  [  ]  Colonoscopy [   ]        GU : Hematuria [  ]  Dysuria [  ]  Nocturia [  ] UTI's [  ]        Vascular : Claudication [  ]  Rest pain [  ]  DVT [  ] Vein stripping [  ] leg ulcers [  ]                          TIA [  ] Stroke [  ]  Varicose veins [  ]        NEURO :  Headaches  [  ] Seizures [  ] Vision changes [  ] Paresthesias [  ]       right-hand-dominant                                         Musculoskeletal :  Arthritis [  ] Gout  [  ]  Back pain [  ]  Joint pain [  ]        Skin :  Rash [  ]  Melanoma [  ] Sores [  ]        Heme : Bleeding problems [  ]Clotting Disorders [  ] Anemia [  ]Blood Transfusion [ ]         Endocrine : Diabetes [  ] Heat or Cold intolerance [  ] Polyuria [  ]excessive thirst [ ]         Psych : Depression [  ]  Anxiety [  ]  Psych hospitalizations [  ] Memory change [  ]                                                                            BP 136/84 (BP Location: Right Arm, Patient Position: Sitting, Cuff Size: Large)   Pulse 76   Resp 16   Ht 5\' 5"  (1.651 m)   Wt 191 lb (86.6 kg)   SpO2 98% Comment: ON RA  BMI 31.78 kg/m  Physical Exam     Physical Exam  General: Short overweight 62 year old Hispanic male no acute distress.  The interview and exam was done in the presence of a hospital accredited interpreter. HEENT: Normocephalic pupils equal , dentition adequate Neck: Supple without JVD, adenopathy, or bruit Chest: Clear to auscultation, symmetrical breath sounds, no rhonchi, no tenderness             or deformity.  Well-healed sternal incision. Cardiovascular: Regular rate and rhythm, grade 4/6 systolic ejection murmur of aortic stenosis , no diastolic murmur, no gallop, peripheral pulses             palpable in all extremities Abdomen:  Soft, nontender, no palpable mass or organomegaly Extremities: Warm, well-perfused, no clubbing cyanosis edema or tenderness,              no venous stasis changes of the legs scar on right leg from endoscopic saphenous vein harvest Rectal/GU: Deferred Neuro: Grossly non--focal and symmetrical throughout Skin: Clean and  dry without rash or ulceration  Diagnostic Tests: Current single-vessel CAD to the OM1. Moderate-severe aortic stenosis bicuspid aortic valve Class III symptoms of angina-CHF  Impression: After speaking with the patient it appears he is having cardiac symptoms from his  aortic stenosis which has progressed Since his CABG procedure.  His coronary disease does not appear to be the major issue here.  We will proceed with CT scans and include a TAVR evaluation and discuss possibility of TAVR with the structural heart team. Plan: Patient will return after CT scans and after he is discussed with the structural heart team for possible TAVR.  His aortic valve procedure is not urgent.  Mikey Bussing, MD [facility] [phone]

## 2017-12-14 ENCOUNTER — Ambulatory Visit (HOSPITAL_COMMUNITY): Payer: BLUE CROSS/BLUE SHIELD

## 2017-12-14 ENCOUNTER — Ambulatory Visit (HOSPITAL_COMMUNITY)
Admission: RE | Admit: 2017-12-14 | Discharge: 2017-12-14 | Disposition: A | Discharge status: Home / Self Care | Payer: BLUE CROSS/BLUE SHIELD | Source: Ambulatory Visit | Attending: Cardiothoracic Surgery | Admitting: Cardiothoracic Surgery

## 2017-12-14 ENCOUNTER — Ambulatory Visit (HOSPITAL_COMMUNITY): Admission: RE | Admit: 2017-12-14 | Payer: BLUE CROSS/BLUE SHIELD | Source: Ambulatory Visit

## 2017-12-14 DIAGNOSIS — I350 Nonrheumatic aortic (valve) stenosis: Secondary | ICD-10-CM | POA: Diagnosis not present

## 2017-12-14 MED ORDER — IOPAMIDOL (ISOVUE-370) INJECTION 76%
100.0000 mL | Freq: Once | INTRAVENOUS | Status: AC | PRN
  Administered 2017-12-14: 100 mL via INTRAVENOUS

## 2017-12-14 MED ORDER — METOPROLOL TARTRATE 5 MG/5ML IV SOLN
INTRAVENOUS | Status: AC
  Filled 2017-12-14: qty 10

## 2017-12-14 MED ORDER — METOPROLOL TARTRATE 5 MG/5ML IV SOLN
10.0000 mg | Freq: Once | INTRAVENOUS | Status: AC
  Administered 2017-12-14: 10 mg via INTRAVENOUS

## 2017-12-19 ENCOUNTER — Encounter: Payer: Self-pay | Admitting: Physician Assistant

## 2017-12-25 ENCOUNTER — Encounter: Payer: Self-pay | Admitting: Cardiovascular Disease

## 2017-12-25 ENCOUNTER — Encounter (INDEPENDENT_AMBULATORY_CARE_PROVIDER_SITE_OTHER): Payer: Self-pay

## 2017-12-25 ENCOUNTER — Ambulatory Visit: Payer: BLUE CROSS/BLUE SHIELD | Admitting: Cardiovascular Disease

## 2017-12-25 VITALS — BP 114/78 | HR 77 | Ht 65.0 in | Wt 191.0 lb

## 2017-12-25 DIAGNOSIS — I350 Nonrheumatic aortic (valve) stenosis: Secondary | ICD-10-CM

## 2017-12-25 NOTE — Patient Instructions (Addendum)
Medication Instructions:  Your physician recommends that you continue on your current medications as directed. Please refer to the Current Medication list given to you today.   Labwork: none  Testing/Procedures: none  Follow-Up: Your physician recommends that you schedule a follow-up appointment in: 3 months--Scheduled for 03/23/18 at 9:20    Any Other Special Instructions Will Be Listed Below (If Applicable).     If you need a refill on your cardiac medications before your next appointment, please call your pharmacy.

## 2017-12-25 NOTE — Progress Notes (Signed)
Valve Clinic Consult Note  Chief Complaint  Patient presents with  . New Patient (Initial Visit)    severe aortic stenosis/discuss TAVR   History of Present Illness:62 yo male with history of CAD s/p CABG, bicuspid aortic valve with severe stenosis, HLD, HTN and obesity here today as a new consult, referred by Dr. Kirke Corin, for evaluation of his aortic stenosis and to discuss potential TAVR. He is known to have CAD and underwent CABG in February 2015. At the time of his CABG he had mild aortic stenosis. Cardiac cath July 2019 with patent LIMA to LAD and patent SVG to RCA with known occlusion of the SVG to the obtuse marginal branch. The obtuse marginal fills from right to left collaterals. Echo May 2019 with normal LV systolic function, LVEF 60-65%. 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 The mean gradient across the valve is 38 mmHg and the peak gradient is 71 mmHg. DVI 0.24. AVA 0.6 cm2. This is consistent with severe aortic valve stenosis. He has been seen by Dr. Donata Clay and is felt to be a better candidate for TAVR than redo open surgical procedure. He has undergone CT scans in preparation for potential TAVR.   He describes minimal chest pain when lifting weights. He has minimal dyspnea on exertion. No dizziness, near syncope or syncope, lower extremity edema.   Primary Care Physician: Glori Luis, MD Primary Cardiologist: Lorine Bears, MD Referring Cardiologist: Lorine Bears, MD  Past Medical History:  Diagnosis Date  . Aortic valve disease    bicuspid aortic valve with mild stenosis  . Bronchitis   . Coronary artery disease    Non-ST elevation myocardial infarction cardiac catheterization showed significant three-vessel coronary artery disease. 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 Ejection fraction was 60%.  . Glucose intolerance (impaired glucose tolerance)   .  Hyperlipidemia   . Hypertension   . MI (myocardial infarction) (HCC)   . Obesity     Past Surgical History:  Procedure Laterality Date  . CARDIAC CATHETERIZATION  06/2013   [facility]  . CARDIAC CATHETERIZATION N/A 10/29/2015   Procedure: Left Heart Cath and Cors/Grafts Angiography;  Surgeon: Iran Ouch, MD;  Location: [facility] INVASIVE CV LAB;  Service: Cardiovascular;  Laterality: N/A;  . CORONARY ARTERY BYPASS GRAFT N/A 07/05/2013   Procedure: CORONARY ARTERY BYPASS GRAFTING (CABG);  Surgeon: [name], MD;  Location: [facility] OR;  Service: Open Heart Surgery;  Laterality: N/A;  . INTRAOPERATIVE TRANSESOPHAGEAL ECHOCARDIOGRAM N/A 07/05/2013   Procedure: INTRAOPERATIVE TRANSESOPHAGEAL ECHOCARDIOGRAM;  Surgeon: [name], MD;  Location: [facility] OR;  Service: Open Heart Surgery;  Laterality: N/A;  . NO PAST SURGERIES    . RIGHT/LEFT HEART CATH AND CORONARY ANGIOGRAPHY Bilateral 11/27/2017   Procedure: RIGHT/LEFT HEART CATH AND CORONARY ANGIOGRAPHY;  Surgeon: Iran Ouch, MD;  Location: [facility] INVASIVE CV LAB;  Service: Cardiovascular;  Laterality: Bilateral;    Current Outpatient Medications  Medication Sig Dispense Refill  . aspirin EC 81 MG tablet Take 1 tablet (81 mg total) by mouth daily. 30 tablet 3  . atorvastatin (LIPITOR) 80 MG tablet TAKE 1 TABLET (80 MG TOTAL) BY MOUTH DAILY AT 6 PM. 90 tablet 3  . amLODipine (NORVASC) 5 MG tablet Take 1 tablet (5 mg total) by mouth daily. 90 tablet 3  . metoprolol tartrate (LOPRESSOR) 50 MG tablet Take 1 tablet (50 mg  total) by mouth 2 (two) times daily. 180 tablet 3   No current facility-administered medications for this visit.     No Known Allergies  Social History   Socioeconomic History  . Marital status: Married    Spouse name: Not on file  . Number of children: Not on file  . Years of education: Not on file  . Highest education level: Not on file  Occupational History  . Not on file  Social Needs  . Financial resource strain: Not  on file  . Food insecurity:    Worry: Not on file    Inability: Not on file  . Transportation needs:    Medical: Not on file    Non-medical: Not on file  Tobacco Use  . Smoking status: Never Smoker  . Smokeless tobacco: Never Used  Substance and Sexual Activity  . Alcohol use: No  . Drug use: No  . Sexual activity: Not on file  Lifestyle  . Physical activity:    Days per week: Not on file    Minutes per session: Not on file  . Stress: Not on file  Relationships  . Social connections:    Talks on phone: Not on file    Gets together: Not on file    Attends religious service: Not on file    Active member of club or organization: Not on file    Attends meetings of clubs or organizations: Not on file    Relationship status: Not on file  . Intimate partner violence:    Fear of current or ex partner: Not on file    Emotionally abused: Not on file    Physically abused: Not on file    Forced sexual activity: Not on file  Other Topics Concern  . Not on file  Social History Narrative  . Not on file    Family History  Problem Relation Age of Onset  . Heart disease Father   . Heart attack Father     Review of Systems:  As stated in the HPI and otherwise negative.   BP 114/78   Pulse 77   Ht 5\' 5"  (1.651 m)   Wt 191 lb (86.6 kg)   SpO2 97%   BMI 31.78 kg/m   Physical Examination: General: Well developed, well nourished, NAD  HEENT: OP clear, mucus membranes moist  SKIN: warm, dry. No rashes. Neuro: No focal deficits  Musculoskeletal: Muscle strength 5/5 all ext  Psychiatric: Mood and affect normal  Neck: No JVD, no carotid bruits, no thyromegaly, no lymphadenopathy.  Lungs:Clear bilaterally, no wheezes, rhonci, crackles Cardiovascular: Regular rate and rhythm.  Loud, harsh systolic murmur.  Abdomen:Soft. Bowel sounds present. Non-tender.  Extremities: No lower extremity edema. Pulses are 2 + in the bilateral DP/PT.  Echo May 2019: Left ventricle: The cavity size  was normal. Systolic function was   normal. The estimated ejection fraction was in the range of 60%   to 65%. Wall motion was normal; there were no regional wall   motion abnormalities. Left ventricular diastolic function   parameters were normal. - Aortic valve: Transvalvular velocity was increased. There was   severe stenosis. Peak velocity (S): 422 cm/s. Mean gradient (S):   38 mm Hg. Peak gradient (S): 71 mm Hg. - Left atrium: The atrium was normal in size. - Right ventricle: Systolic function was normal. - Pulmonary arteries: Systolic pressure was mildly elevated. PA   peak pressure: 46 mm Hg (S).  Impressions:  - Significant increase in  aortic valve gradient compared to prior   study in 2018. Consider TEE or other study/cardiac cath for   verification if clinically indicated.  ------------------------------------------------------------------- Labs, prior tests, procedures, and surgery: Coronary artery bypass grafting.  ------------------------------------------------------------------- Study data:  The previous study was not available, so comparison was made to the report of March 2018.  Study status:  Routine. Procedure:  Transthoracic echocardiography. Image quality was good. Intravenous contrast (Definity) was administered.  Study completion:  There were no complications.          Transthoracic echocardiography.  M-mode, complete 2D, spectral Doppler, and color Doppler.  Birthdate:  Patient birthdate: [birth year].  Age:  Patient is 62 yr old.  Sex:  Gender: male.    BMI: 32.7 kg/m^2.  Blood pressure:     138/86  Patient status:  Outpatient.  Study date: Study date: 10/10/2017. Study time: 11:51 AM.  -------------------------------------------------------------------  ------------------------------------------------------------------- Left ventricle:  The cavity size was normal. Systolic function was normal. The estimated ejection fraction was in the range of 60%  to 65%. Wall motion was normal; there were no regional wall motion abnormalities. The transmitral flow pattern was normal. The deceleration time of the early transmitral flow velocity was normal. The pulmonary vein flow pattern was normal. The tissue Doppler parameters were normal. Left ventricular diastolic function parameters were normal.  ------------------------------------------------------------------- Aortic valve:   Trileaflet; normal thickness, severely calcified leaflets. Mobility was not restricted.  Doppler:  Transvalvular velocity was increased. There was severe stenosis. There was no regurgitation.    VTI ratio of LVOT to aortic valve: 0.24. Valve area (VTI): 0.6 cm^2. Indexed valve area (VTI): 0.29 cm^2/m^2. Mean velocity ratio of LVOT to aortic valve: 0.26. Valve area (Vmean): 0.66 cm^2. Indexed valve area (Vmean): 0.32 cm^2/m^2.    Mean gradient (S): 38 mm Hg. Peak gradient (S): 71 mm Hg.  ------------------------------------------------------------------- Aorta:  Aortic root: The aortic root was normal in size.  ------------------------------------------------------------------- Mitral valve:   Structurally normal valve.   Mobility was not restricted.  Doppler:  Transvalvular velocity was within the normal range. There was no evidence for stenosis. There was no regurgitation.    Valve area by pressure half-time: 3.19 cm^2. Indexed valve area by pressure half-time: 1.55 cm^2/m^2.    Peak gradient (D): 4 mm Hg.  ------------------------------------------------------------------- Left atrium:  The atrium was normal in size.  ------------------------------------------------------------------- Right ventricle:  The cavity size was normal. Wall thickness was normal. Systolic function was normal.  ------------------------------------------------------------------- Pulmonic valve:    Structurally normal valve.   Cusp separation was normal.  Doppler:   Transvalvular velocity was within the normal range. There was no evidence for stenosis. There was no regurgitation.  ------------------------------------------------------------------- Tricuspid valve:   Structurally normal valve.    Doppler: Transvalvular velocity was within the normal range. There was mild regurgitation.  ------------------------------------------------------------------- Pulmonary artery:   The main pulmonary artery was normal-sized. Systolic pressure was mildly elevated.  ------------------------------------------------------------------- Right atrium:  The atrium was normal in size.  ------------------------------------------------------------------- Pericardium:  There was no pericardial effusion.  ------------------------------------------------------------------- Systemic veins: Inferior vena cava: The vessel was normal in size.  ------------------------------------------------------------------- Measurements   Left ventricle                           Value          Reference  LV ID, ED, PLAX chordal                  49  mm       43 - 52  LV ID, ES, PLAX chordal                  31    mm       23 - 38  LV fx shortening, PLAX chordal           37    %        >=29  LV PW thickness, ED                      9     mm       ----------  IVS/LV PW ratio, ED                      0.89           <=1.3  Stroke volume, 2D                        49    ml       ----------  Stroke volume/bsa, 2D                    24    ml/m^2   ----------  LV ejection fraction, 1-p A4C            68    %        ----------  LV e&', lateral                           5.77  cm/s     ----------  LV E/e&', lateral                         16.22          ----------  LV e&', medial                            121   cm/s     ----------  LV E/e&', medial                          0.77           ----------  LV e&', average                           63.39 cm/s     ----------  LV E/e&',  average                         1.48           ----------    Ventricular septum                       Value          Reference  IVS thickness, ED                        8     mm       ----------    LVOT  Value          Reference  LVOT ID, S                               18    mm       ----------  LVOT area                                2.54  cm^2     ----------  LVOT ID                                  18    mm       ----------  LVOT mean velocity, S                    74.8  cm/s     ----------  LVOT VTI, S                              19.4  cm       ----------  Stroke volume (SV), LVOT DP              49.4  ml       ----------  Stroke index (SV/bsa), LVOT DP           24    ml/m^2   ----------    Aortic valve                             Value          Reference  Aortic valve peak velocity, S            422   cm/s     ----------  Aortic valve mean velocity, S            288   cm/s     ----------  Aortic valve VTI, S                      82    cm       ----------  Aortic mean gradient, S                  38    mm Hg    ----------  Aortic peak gradient, S                  71    mm Hg    ----------  VTI ratio, LVOT/AV                       0.24           ----------  Aortic valve area, VTI                   0.6   cm^2     ----------  Aortic valve area/bsa, VTI               0.29  cm^2/m^2 ----------  Velocity ratio, mean, LVOT/AV            0.26           ----------  Aortic valve area, mean velocity  0.66  cm^2     ----------  Aortic valve area/bsa, mean              0.32  cm^2/m^2 ----------  velocity    Aorta                                    Value          Reference  Aortic root ID, ED                       31    mm       ----------  Ascending aorta ID, A-P, S               28    mm       ----------    Left atrium                              Value          Reference  LA ID, A-P, ES                           33    mm       ----------  LA  ID/bsa, A-P                           1.61  cm/m^2   <=2.2  LA volume, S                             38.5  ml       ----------  LA volume/bsa, S                         18.7  ml/m^2   ----------  LA volume, ES, 1-p A4C                   35.7  ml       ----------  LA volume/bsa, ES, 1-p A4C               17.4  ml/m^2   ----------  LA volume, ES, 1-p A2C                   40.9  ml       ----------  LA volume/bsa, ES, 1-p A2C               19.9  ml/m^2   ----------    Mitral valve                             Value          Reference  Mitral E-wave peak velocity              93.6  cm/s     ----------  Mitral A-wave peak velocity              78.6  cm/s     ----------  Mitral deceleration time         (H)     236   ms       150 - 230  Mitral pressure half-time                69    ms       ----------  Mitral peak gradient, D                  4     mm Hg    ----------  Mitral E/A ratio, peak                   1.2            ----------  Mitral valve area, PHT, DP               3.19  cm^2     ----------  Mitral valve area/bsa, PHT, DP           1.55  cm^2/m^2 ----------    Pulmonary arteries                       Value          Reference  PA pressure, S, DP               (H)     46    mm Hg    <=30    Tricuspid valve                          Value          Reference  Tricuspid regurg peak velocity           320   cm/s     ----------  Tricuspid peak RV-RA gradient            41    mm Hg    ----------    Right atrium                             Value          Reference  RA ID, S-I, ES, A4C              (H)     49.3  mm       34 - 49  RA area, ES, A4C                         14.5  cm^2     8.3 - 19.5  RA volume, ES, A/L                       33.4  ml       ----------  RA volume/bsa, ES, A/L                   16.3  ml/m^2   ----------    Right ventricle                          Value          Reference  TAPSE                                    15.9  mm       ----------  RV s&', lateral, S  8.27  cm/s     ----------    Pulmonic valve                           Value          Reference  Pulmonic valve peak velocity, S          83.4  cm/s     ----------  Cardiac cath 11/27/17:  Suezanne Jacquet LAD to Mid LAD lesion is 80% stenosed.  Dist LAD lesion is 30% stenosed.  Ost 1st Diag to 1st Diag lesion is 70% stenosed.  Ost 2nd Diag to 2nd Diag lesion is 100% stenosed.  Prox RCA lesion is 30% stenosed.  Mid RCA lesion is 100% stenosed.  Prox Graft lesion before Ramus is 100% stenosed.  The graft exhibits no disease.  LIMA.  The graft exhibits no disease.  Prox Cx to Mid Cx lesion is 100% stenosed.  Origin lesion is 100% stenosed.   1.  Significant underlying three-vessel coronary artery disease with patent LIMA to LAD (could not be selectively engaged), patent SVG to RCA and known occluded SVG to OM 3.  Occluded native left circumflex. 2.  Moderate to severe aortic stenosis on current hemodynamics with a mean gradient of 26 mmHg and valve area of 1.2.  However, his aortic stenosis is likely severe based on echocardiogram findings.  Aortic valve area today is overestimated due to high cardiac output.  Valve area by echocardiogram was 0.6.  Valve morphology is also consistent with severe stenosis. 3.  Right heart catheterization showed mildly elevated filling pressures with mild pulmonary hypertension.  CTA July 2019: CTA CHEST FINDINGS  Cardiovascular: Heart size is mildly enlarged. There is no significant pericardial fluid, thickening or pericardial calcification. There is aortic atherosclerosis, as well as atherosclerosis of the great vessels of the mediastinum and the coronary arteries, including calcified atherosclerotic plaque in the left anterior descending, left circumflex and right coronary arteries. Status post median sternotomy for CABG including LIMA to the LAD. Severe thickening calcification of the aortic valve.  Mediastinum/Lymph Nodes: No  pathologically enlarged mediastinal or hilar lymph nodes. Esophagus is unremarkable in appearance. No axillary lymphadenopathy.  Lungs/Pleura: No suspicious appearing pulmonary nodules or masses are noted. No acute consolidative airspace disease. No pleural effusions. Areas of scarring and atelectasis are noted in the right middle lobe and left upper lobe.  Musculoskeletal/Soft Tissues: Median sternotomy wires. There are no aggressive appearing lytic or blastic lesions noted in the visualized portions of the skeleton.  CTA ABDOMEN AND PELVIS FINDINGS  Hepatobiliary: No suspicious cystic or solid hepatic lesions. No intra or extrahepatic biliary ductal dilatation. Gallbladder is normal in appearance.  Pancreas: No pancreatic mass. No pancreatic ductal dilatation. No pancreatic or peripancreatic fluid or inflammatory changes.  Spleen: Unremarkable.  Adrenals/Urinary Tract: Bilateral kidneys and bilateral adrenal glands are normal in appearance. No hydroureteronephrosis. Urinary bladder is normal in appearance.  Stomach/Bowel: Normal appearance of the stomach. No pathologic dilatation of the small bowel or colon. Normal appendix.  Vascular/Lymphatic: Aortic atherosclerosis with vascular findings and measurements pertinent to potential TAVR procedure, as detailed below. Celiac axis, superior mesenteric artery and inferior mesenteric artery are all widely patent without hemodynamically significant stenosis. Single right and 2 left renal arteries are all widely patent without hemodynamically significant stenosis. There are no aggressive appearing lytic or blastic lesions noted in the visualized portions of the skeleton.  Reproductive: Prostate gland and seminal vesicles are unremarkable in appearance.  Other:  No significant volume of ascites.  No pneumoperitoneum.  Musculoskeletal: There are no aggressive appearing lytic or blastic lesions noted in the visualized  portions of the skeleton.  VASCULAR MEASUREMENTS PERTINENT TO TAVR:  AORTA:  Minimal Aortic Diameter-15 x 15 mm  Severity of Aortic Calcification-minimal  RIGHT PELVIS:  Right Common Iliac Artery -  Minimal Diameter-9.1 x 8.0 mm  Tortuosity-mild  Calcification-moderate  Right External Iliac Artery -  Minimal Diameter-7.1 x 6.7 mm  Tortuosity-moderate  Calcification-none  Right Common Femoral Artery -  Minimal Diameter-5.4 x 4.3 mm  Tortuosity-mild  Calcification-mild  LEFT PELVIS:  Left Common Iliac Artery -  Minimal Diameter-7.5 x 6.7 mm  Tortuosity-mild  Calcification-mild  Left External Iliac Artery -  Minimal Diameter-7.8 x 7.6 mm  Tortuosity-mild  Calcification-none  Left Common Femoral Artery -  Minimal Diameter-6.7 x 5.7 mm  Tortuosity-mild  Calcification-mild  Review of the MIP images confirms the above findings.  IMPRESSION: 1. Vascular findings and measurements pertinent to potential TAVR procedure, as detailed above. 2. Severe thickening calcification of the aortic valve, compatible with the reported clinical history of severe aortic stenosis. 3. Aortic atherosclerosis, in addition to 3 vessel coronary artery disease. Status post median sternotomy for CABG including LIMA to the LAD. 4. Additional incidental findings, as above.  Cardiac CT July 2019: FINDINGS: Aortic Valve: Trileaflet aortic valve with partially co-joined left and right leaflets. Leaflets are moderately thickened and calcified leaflets with moderately restricted leaflet excursions and no calcifications are extending into the LVOT.  Aorta: Normal size with mild diffuse calcifications and atheroma and no dissection.  Sinotubular Junction: 27 x 24 mm  Ascending Thoracic Aorta: 32 x 31 mm  Aortic Arch: 26 x 24 mm  Descending Thoracic Aorta: 24 x 23 mm  Sinus of Valsalva Measurements:  Non-coronary: 30  mm  Right -coronary: 33 mm  Left -coronary: 33 mm  Coronary Artery Height above Annulus:  Left Main: 16 mm  Right Coronary: 18 mm  Virtual Basal Annulus Measurements:  Maximum/Minimum Diameter: 26.7 x 20.9 mm  Mean Diameter: 23.2 mm  Perimeter: 74.3 mm  Area: 422 mm2  Optimum Fluoroscopic Angle for Delivery: RAO 5 CAU 3  IMPRESSION: 1. Trileaflet aortic valve with partially co-joined left and right leaflets. Leaflets are moderately thickened and calcified leaflets with moderately restricted leaflet excursions and no calcifications are extending into the LVOT. Annular measurements suitable for delivery of a 23 mm Edwards-SAPIEN 3 valve.  2. Sufficient coronary to annulus distance.  3. Optimum Fluoroscopic Angle for Delivery:  RAO 5 CAU 3  4. No thrombus in the left atrial appendage.  EKG:  EKG is not ordered today. The ekg ordered today demonstrates   Recent Labs: 11/23/2017: BUN 10; Creatinine, Ser 0.94; Hemoglobin 15.8; Platelets 187; Potassium 4.1; Sodium 142     Wt Readings from Last 3 Encounters:  12/25/17 191 lb (86.6 kg)  12/06/17 191 lb (86.6 kg)  11/27/17 191 lb (86.6 kg)     Other studies Reviewed: Additional studies/ records that were reviewed today include: echo images, cath films, old notes Review of the above records demonstrates: severe AS   Assessment and Plan:   1. Bicuspid aortic valve with severe stenosis: He has severe aortic stenosis but at this time he is minimally symptomatic. He has completed the workup for the TAVR but he wishes to delay TAVR for now. I have personally reviewed the echo images. The aortic valve is bicuspid with calcified leaflets with limited leaflet mobility. I think he would  benefit from AVR. Dr. Donata ClayVan Trigt did not think open surgical AVR was the best option and recommended TAVR. I think he is a good candidate for TAVR.   STS Risk Score:   Procedure: AVR + CAB  Risk of Mortality: 1.606%  Renal  Failure: 1.553%  Permanent Stroke: 1.038%  Prolonged Ventilation: 9.546%  DSW Infection: 0.231%  Reoperation: 5.783%  Morbidity or Mortality: 15.626%  Short Length of Stay:46.912%  Long Length of Stay: 4.484%   I have reviewed the natural history of aortic stenosis with the patient and their family members  who are present today. We have discussed the limitations of medical therapy and the poor prognosis associated with symptomatic aortic stenosis. We have reviewed potential treatment options, including palliative medical therapy, conventional surgical aortic valve replacement, and transcatheter aortic valve replacement. We discussed treatment options in the context of the patient's specific comorbid medical conditions.   He would like to wait before scheduling TAVR. I will see him back in 3 months. He will call if he has worsened symptoms before then.   Current medicines are reviewed at length with the patient today.  The patient does not have concerns regarding medicines.  The following changes have been made:  no change  Labs/ tests ordered today include:  No orders of the defined types were placed in this encounter.    Disposition:   FU with me in  3 months   Signed, Verne Carrowhristopher McAlhany, MD 12/25/2017 11:13 AM    [facility] [address], [place], [place]  [address] Phone: [phone]; Fax: [phone]

## 2017-12-27 ENCOUNTER — Ambulatory Visit: Payer: BLUE CROSS/BLUE SHIELD | Admitting: Cardiothoracic Surgery

## 2017-12-27 ENCOUNTER — Other Ambulatory Visit (HOSPITAL_COMMUNITY): Payer: BLUE CROSS/BLUE SHIELD

## 2018-01-06 NOTE — Progress Notes (Signed)
Cardiology Office Note Date:  01/08/2018  Patient ID:  James Yates, DOB 06/17/1955, MRN [id number] PCP:  Glori LuisSonnenberg, Eric G, MD  Cardiologist:  Dr. Kirke CorinArida, MD    Chief Complaint: Follow up diagnostic cath  History of Present Illness: James LevanWalter Yates is a 62 y.o. male with history of CAD s/p CABG in 06/2013 following a NSTEMI, bicuspid aortic valve with severe stenosis, HTN, and HLD who presents for follow up of cardiac cath.  LHC in 10/2015 showed severe underlying 3-vessel CAD with a patent LIMA-LAD, and SVG-RCA. The SVG-LCx was occluded. The native LCx was occluded proximally with faint collaterals. There was moderate aortic stenosis with a peak to peak gradient of 22 mmHg. This LHC was reviewed with our CTO team with the opinion being the LCx was not optimal for PCI. Echo in 09/2017 showed an EF of 60-65%, normal wall motion, progression of his aortic valve stenosis to severe with a peak velocity of 422 cm/s, mean gradient of 38 mmHg, and apeak gradient of 71 mmHg, left atrium was normal in size, RVSF normal, PASP 46 mmHg. He was seen on 11/23/2017 with worsening exertional chest tightness. He underwent R/LHC on 11/27/2017 that showed significant underlying 3-vessel CAD with a patent LIMA to LAD (could not be selectively engaged), patent SVG to RCA and known occluded SVG to OM3 along with an occluded LCx. There was moderate to severe aortic stenosis with a mean gradient of 26 mmHg and a valve area of 1.2. However, his aortic stenosis was felt to be likely severe based on echo findings and his valve area noted on 11/27/2017 was felt to be overestimated due to high cardiac output. Valve area of echo noted to be 0.6. The aortic valve morphology was also consistent with severe aortic stenosis. RHC showed mildly elevated filling pressures with mild pulmonary hypertension. Recommendation was to proceed with aortic valve replacement along with 1-vessel CABG to OM1. Patient was evaluated by Dr. Donata ClayVan Trigt on  12/06/2017 with recommendation for TAVR workup, as his aortic valve stenosis was felt to be the main culprit. Cardiac CTA showed a trileaflet aortic valve with a partially co-joined left and right leaflets. He was seen by Dr. Clifton JamesMcAlhany on 12/25/2017 for initial TAVR evaluation and was felt to be a good candidate for TAVR. However, the patient preferred to delay this procedure. He has been advised with follow up with the TAVR team in 02/2018. Cardiac CT showed normal sized aorta.  He comes in today accompanied by his daughter-in-law as well as [facility]. He has done well since he was last seen in the office. He reports he actually feels better. No chest pain and slightly improved SOB. No dizziness, presyncope, or syncope. He denies any lower extremity swelling, orthopnea, PND, cough, abdominal distension, or early satiety. He has had multiple conversations with his family and he would now like to proceed with TAVR. He has not had any complications from his right femoral cardiac cath site.   Past Medical History:  Diagnosis Date  . Aortic valve disease    bicuspid aortic valve with mild stenosis  . Bronchitis   . Coronary artery disease    Non-ST elevation myocardial infarction cardiac catheterization showed significant three-vessel coronary artery disease. 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 Ejection fraction was 60%.  . Glucose intolerance (impaired glucose tolerance)   . Hyperlipidemia   . Hypertension   .  MI (myocardial infarction) (HCC)   . Obesity     Past Surgical History:  Procedure Laterality Date  . CARDIAC CATHETERIZATION  06/2013   [facility]  . CARDIAC CATHETERIZATION N/A 10/29/2015   Procedure: Left Heart Cath and Cors/Grafts Angiography;  Surgeon: Iran Ouch, MD;  Location: [facility] INVASIVE CV LAB;  Service: Cardiovascular;  Laterality: N/A;  . CORONARY ARTERY BYPASS GRAFT N/A 07/05/2013    Procedure: CORONARY ARTERY BYPASS GRAFTING (CABG);  Surgeon: [name], MD;  Location: [facility] OR;  Service: Open Heart Surgery;  Laterality: N/A;  . INTRAOPERATIVE TRANSESOPHAGEAL ECHOCARDIOGRAM N/A 07/05/2013   Procedure: INTRAOPERATIVE TRANSESOPHAGEAL ECHOCARDIOGRAM;  Surgeon: [name], MD;  Location: [facility] OR;  Service: Open Heart Surgery;  Laterality: N/A;  . NO PAST SURGERIES    . RIGHT/LEFT HEART CATH AND CORONARY ANGIOGRAPHY Bilateral 11/27/2017   Procedure: RIGHT/LEFT HEART CATH AND CORONARY ANGIOGRAPHY;  Surgeon: Iran Ouch, MD;  Location: [facility] INVASIVE CV LAB;  Service: Cardiovascular;  Laterality: Bilateral;    Current Meds  Medication Sig  . amLODipine (NORVASC) 5 MG tablet Take 1 tablet (5 mg total) by mouth daily.  Marland Kitchen aspirin EC 81 MG tablet Take 1 tablet (81 mg total) by mouth daily.  Marland Kitchen atorvastatin (LIPITOR) 80 MG tablet TAKE 1 TABLET (80 MG TOTAL) BY MOUTH DAILY AT 6 PM.  . metoprolol tartrate (LOPRESSOR) 50 MG tablet Take 1 tablet (50 mg total) by mouth 2 (two) times daily.    Allergies:   Patient has no known allergies.   Social History:  The patient  reports that he has never smoked. He has never used smokeless tobacco. He reports that he does not drink alcohol or use drugs.   Family History:  The patient's family history includes Heart attack in his father; Heart disease in his father.  ROS:   Review of Systems  Constitutional: Positive for malaise/fatigue. Negative for chills, diaphoresis, fever and weight loss.  HENT: Negative for congestion.   Eyes: Negative for discharge and redness.  Respiratory: Positive for shortness of breath. Negative for cough, hemoptysis, sputum production and wheezing.   Cardiovascular: Negative for chest pain, palpitations, orthopnea, claudication, leg swelling and PND.  Gastrointestinal: Negative for abdominal pain, blood in stool, heartburn, melena, nausea and vomiting.  Genitourinary: Negative for hematuria.    Musculoskeletal: Negative for falls and myalgias.  Skin: Negative for rash.  Neurological: Negative for dizziness, tingling, tremors, sensory change, speech change, focal weakness, loss of consciousness and weakness.  Endo/Heme/Allergies: Does not bruise/bleed easily.  Psychiatric/Behavioral: Negative for substance abuse. The patient is not nervous/anxious.   All other systems reviewed and are negative.    PHYSICAL EXAM:  VS:  BP 110/72 (BP Location: Left Arm, Patient Position: Sitting, Cuff Size: Normal)   Pulse 78   Ht 5\' 5"  (1.651 m)   Wt 188 lb (85.3 kg)   BMI 31.28 kg/m  BMI: Body mass index is 31.28 kg/m.  Physical Exam  Constitutional: He is oriented to person, place, and time. He appears well-developed and well-nourished.  HENT:  Head: Normocephalic and atraumatic.  Eyes: Right eye exhibits no discharge. Left eye exhibits no discharge.  Neck: Normal range of motion. No JVD present.  Cardiovascular: Normal rate, regular rhythm, S1 normal and S2 normal. Exam reveals no distant heart sounds, no friction rub, no midsystolic click and no opening snap.  Murmur heard.  Harsh midsystolic murmur is present with a grade of 3/6 at the upper right sternal border radiating to the neck. Pulses:  Posterior tibial pulses are 2+ on the right side, and 2+ on the left side.  Right femoral cardiac cath site is well healed without any active bleeding, bruising, swelling, erythema, warmth, or TTP. No bruit.   Pulmonary/Chest: Effort normal and breath sounds normal. No respiratory distress. He has no decreased breath sounds. He has no wheezes. He has no rales. He exhibits no tenderness.  Abdominal: Soft. He exhibits no distension. There is no tenderness.  Musculoskeletal: He exhibits no edema.  Neurological: He is alert and oriented to person, place, and time.  Skin: Skin is warm and dry. No cyanosis. Nails show no clubbing.  Psychiatric: He has a normal mood and affect. His speech is normal  and behavior is normal. Judgment and thought content normal.     EKG:  Was ordered and interpreted by me today. Shows NSR, 78 bpm, RBBB (unchanged from prior)  Recent Labs: 11/23/2017: BUN 10; Creatinine, Ser 0.94; Hemoglobin 15.8; Platelets 187; Potassium 4.1; Sodium 142  No results found for requested labs within last 8760 hours.   CrCl cannot be calculated (Patient's most recent lab result is older than the maximum 21 days allowed.).   Wt Readings from Last 3 Encounters:  01/08/18 188 lb (85.3 kg)  12/25/17 191 lb (86.6 kg)  12/06/17 191 lb (86.6 kg)     Other studies reviewed: Additional studies/records reviewed today include: summarized above  ASSESSMENT AND PLAN:  1. CAD s/p CABG without angina: No symptoms concerning for angina at this time. Recent LHC as above. Has been evaluated by CVTS with recommendation to proceed with TAVR for his aortic stenosis over open AVR with 1-vessel repeat CABG. Continue ASA 81 mg daily, Lopressor 50 mg bid, and Lipitor 80 mg daily. Post-cath instructions discussed.   2. Severe aortic stenosis in the setting of bicuspid aortic valve: Has been evaluated by both CVTS and the TAVR team and felt to be a good TAVR candidate. Patient initially wanted to defer TAVR when he was seen at his initial consult. He actually feels better now than when he was seen earlier in the summer as part of the above work up. He has since spoken with multiple family members and would like to proceed with TAVR. We will send a message indicating this to the TAVR team including RN and MD.    3. HTN: Blood pressure well controlled today. Continue amlodipine 5 mg daily and Lopressor 50 mg bid.   4. HLD: Check lipid and liver function. Continue Lipitor 80 mg daily. Goal LDL < 70.   5. Language barrier: [place] supplied medical interpreter was used for his office visit today.   Disposition: F/u with Dr. Kirke Corin or an APP in ~ 6 weeks.    Current medicines are reviewed at  length with the patient today.  The patient did not have any concerns regarding medicines.  Signed, Eula Listen, PA-C 01/08/2018 2:28 PM     [facility] [address] [place], [place] [address] [phone]

## 2018-01-08 ENCOUNTER — Other Ambulatory Visit
Admission: RE | Admit: 2018-01-08 | Discharge: 2018-01-08 | Disposition: A | Discharge status: Home / Self Care | Payer: BLUE CROSS/BLUE SHIELD | Source: Ambulatory Visit | Attending: Physician Assistant | Admitting: Physician Assistant

## 2018-01-08 ENCOUNTER — Encounter: Payer: Self-pay | Admitting: Physician Assistant

## 2018-01-08 ENCOUNTER — Ambulatory Visit: Payer: BLUE CROSS/BLUE SHIELD | Admitting: Physician Assistant

## 2018-01-08 VITALS — BP 110/72 | HR 78 | Ht 65.0 in | Wt 188.0 lb

## 2018-01-08 DIAGNOSIS — I350 Nonrheumatic aortic (valve) stenosis: Secondary | ICD-10-CM | POA: Diagnosis not present

## 2018-01-08 DIAGNOSIS — I10 Essential (primary) hypertension: Secondary | ICD-10-CM

## 2018-01-08 DIAGNOSIS — I25810 Atherosclerosis of coronary artery bypass graft(s) without angina pectoris: Secondary | ICD-10-CM

## 2018-01-08 DIAGNOSIS — E785 Hyperlipidemia, unspecified: Secondary | ICD-10-CM

## 2018-01-08 DIAGNOSIS — Z789 Other specified health status: Secondary | ICD-10-CM

## 2018-01-08 LAB — COMPREHENSIVE METABOLIC PANEL
ALBUMIN: 4.6 g/dL (ref 3.5–5.0)
ALT: 20 U/L (ref 0–44)
AST: 23 U/L (ref 15–41)
Alkaline Phosphatase: 97 U/L (ref 38–126)
Anion gap: 9 (ref 5–15)
BILIRUBIN TOTAL: 1 mg/dL (ref 0.3–1.2)
BUN: 16 mg/dL (ref 8–23)
CHLORIDE: 103 mmol/L (ref 98–111)
CO2: 27 mmol/L (ref 22–32)
Calcium: 8.5 mg/dL — ABNORMAL LOW (ref 8.9–10.3)
Creatinine, Ser: 0.88 mg/dL (ref 0.61–1.24)
GFR calc Af Amer: >60 mL/min (ref >60)
GFR calc non Af Amer: >60 mL/min (ref >60)
GLUCOSE: 92 mg/dL (ref 70–99)
POTASSIUM: 3.4 mmol/L — AB (ref 3.5–5.1)
Sodium: 139 mmol/L (ref 135–145)
Total Protein: 7.5 g/dL (ref 6.5–8.1)

## 2018-01-08 LAB — CBC WITH DIFFERENTIAL/PLATELET
Basophils Absolute: 0 10*3/uL (ref 0–0.1)
Basophils Relative: 0 %
EOS PCT: 1 %
Eosinophils Absolute: 0.1 10*3/uL (ref 0–0.7)
HCT: 42.4 % (ref 40.0–52.0)
Hemoglobin: 14.5 g/dL (ref 13.0–18.0)
LYMPHS ABS: 1.2 10*3/uL (ref 1.0–3.6)
LYMPHS PCT: 17 %
MCH: 30 pg (ref 26.0–34.0)
MCHC: 34.1 g/dL (ref 32.0–36.0)
MCV: 87.8 fL (ref 80.0–100.0)
MONO ABS: 0.5 10*3/uL (ref 0.2–1.0)
Monocytes Relative: 7 %
Neutro Abs: 5 10*3/uL (ref 1.4–6.5)
Neutrophils Relative %: 75 %
PLATELETS: 158 10*3/uL (ref 150–440)
RBC: 4.83 MIL/uL (ref 4.40–5.90)
RDW: 13.7 % (ref 11.5–14.5)
WBC: 6.8 10*3/uL (ref 3.8–10.6)

## 2018-01-08 LAB — LIPID PANEL
Cholesterol: 130 mg/dL (ref 0–200)
HDL: 39 mg/dL — ABNORMAL LOW (ref >40)
LDL Cholesterol: 66 mg/dL (ref 0–99)
Total CHOL/HDL Ratio: 3.3 RATIO
Triglycerides: 127 mg/dL (ref <150)
VLDL: 25 mg/dL (ref 0–40)

## 2018-01-08 NOTE — Patient Instructions (Signed)
Medication Instructions: - Your physician recommends that you continue on your current medications as directed. Please refer to the Current Medication list given to you today.  Labwork: - Your physician recommends that you have lab work today: CMET/ CBC/ Lipid  Procedures/Testing: - A message has been sent to Julieta GuttingLauren Brown, TAVR nurse coordinator to be in touch with you to schedule your procedure.  Follow-Up: - Your physician recommends that you schedule a follow-up appointment: 6 weeks after your TAVR procedure with Dr. Kirke CorinArida- we will be in touch with you to schedule this once we know the date of your procedure.   Any Additional Special Instructions Will Be Listed Below (If Applicable).     If you need a refill on your cardiac medications before your next appointment, please call your pharmacy.

## 2018-01-09 ENCOUNTER — Other Ambulatory Visit: Payer: Self-pay | Admitting: *Deleted

## 2018-01-09 DIAGNOSIS — E876 Hypokalemia: Secondary | ICD-10-CM

## 2018-01-09 DIAGNOSIS — I10 Essential (primary) hypertension: Secondary | ICD-10-CM

## 2018-01-09 MED ORDER — POTASSIUM CHLORIDE ER 10 MEQ PO TBCR: EXTENDED_RELEASE_TABLET | ORAL | 3 refills | Status: DC

## 2018-01-16 ENCOUNTER — Other Ambulatory Visit
Admission: RE | Admit: 2018-01-16 | Discharge: 2018-01-16 | Disposition: A | Discharge status: Home / Self Care | Payer: BLUE CROSS/BLUE SHIELD | Source: Ambulatory Visit | Attending: Physician Assistant | Admitting: Physician Assistant

## 2018-01-16 DIAGNOSIS — I10 Essential (primary) hypertension: Secondary | ICD-10-CM | POA: Diagnosis present

## 2018-01-16 DIAGNOSIS — E876 Hypokalemia: Secondary | ICD-10-CM

## 2018-01-16 LAB — BASIC METABOLIC PANEL
Anion gap: 5 (ref 5–15)
BUN: 14 mg/dL (ref 8–23)
CO2: 27 mmol/L (ref 22–32)
Calcium: 8.6 mg/dL — ABNORMAL LOW (ref 8.9–10.3)
Chloride: 109 mmol/L (ref 98–111)
Creatinine, Ser: 0.98 mg/dL (ref 0.61–1.24)
GFR calc Af Amer: >60 mL/min (ref >60)
GLUCOSE: 119 mg/dL — AB (ref 70–99)
POTASSIUM: 3.6 mmol/L (ref 3.5–5.1)
Sodium: 141 mmol/L (ref 135–145)

## 2018-01-23 ENCOUNTER — Encounter: Payer: Self-pay | Admitting: Emergency Medicine

## 2018-01-23 ENCOUNTER — Other Ambulatory Visit: Payer: Self-pay

## 2018-01-23 ENCOUNTER — Emergency Department
Admission: EM | Admit: 2018-01-23 | Discharge: 2018-01-23 | Disposition: A | Discharge status: Home / Self Care | Payer: BLUE CROSS/BLUE SHIELD | Attending: Emergency Medicine | Admitting: Emergency Medicine

## 2018-01-23 ENCOUNTER — Emergency Department: Payer: BLUE CROSS/BLUE SHIELD

## 2018-01-23 DIAGNOSIS — M545 Low back pain: Secondary | ICD-10-CM | POA: Diagnosis present

## 2018-01-23 DIAGNOSIS — I10 Essential (primary) hypertension: Secondary | ICD-10-CM | POA: Diagnosis not present

## 2018-01-23 DIAGNOSIS — Z7982 Long term (current) use of aspirin: Secondary | ICD-10-CM | POA: Diagnosis not present

## 2018-01-23 DIAGNOSIS — M5442 Lumbago with sciatica, left side: Secondary | ICD-10-CM | POA: Diagnosis not present

## 2018-01-23 DIAGNOSIS — M5441 Lumbago with sciatica, right side: Secondary | ICD-10-CM | POA: Diagnosis not present

## 2018-01-23 DIAGNOSIS — I252 Old myocardial infarction: Secondary | ICD-10-CM | POA: Insufficient documentation

## 2018-01-23 DIAGNOSIS — I2510 Atherosclerotic heart disease of native coronary artery without angina pectoris: Secondary | ICD-10-CM | POA: Insufficient documentation

## 2018-01-23 DIAGNOSIS — Z79899 Other long term (current) drug therapy: Secondary | ICD-10-CM | POA: Diagnosis not present

## 2018-01-23 DIAGNOSIS — Z951 Presence of aortocoronary bypass graft: Secondary | ICD-10-CM | POA: Insufficient documentation

## 2018-01-23 MED ORDER — METHOCARBAMOL 500 MG PO TABS: 500.0000 mg | ORAL_TABLET | Freq: Three times a day (TID) | ORAL | 0 refills | Status: AC | PRN

## 2018-01-23 MED ORDER — PREDNISONE 50 MG PO TABS: ORAL_TABLET | ORAL | 0 refills | Status: DC

## 2018-01-23 MED ORDER — PREDNISONE 20 MG PO TABS
60.0000 mg | ORAL_TABLET | Freq: Once | ORAL | Status: AC
  Administered 2018-01-23: 60 mg via ORAL
  Filled 2018-01-23: qty 3

## 2018-01-23 MED ORDER — METHOCARBAMOL 500 MG PO TABS
1000.0000 mg | ORAL_TABLET | Freq: Once | ORAL | Status: AC
Start: 2018-01-23 — End: 2018-01-23
  Administered 2018-01-23: 1000 mg via ORAL
  Filled 2018-01-23: qty 2

## 2018-01-23 MED ORDER — KETOROLAC TROMETHAMINE 30 MG/ML IJ SOLN
30.0000 mg | Freq: Once | INTRAMUSCULAR | Status: AC
  Administered 2018-01-23: 30 mg via INTRAMUSCULAR
  Filled 2018-01-23: qty 1

## 2018-01-23 NOTE — ED Triage Notes (Signed)
Pt arrived to the ED accompanied by a friend for complaints of back pain. Pt reports that he has chronic back pain, denies any particular injury. Pt is AOx4 in no apparent distress.

## 2018-01-23 NOTE — ED Provider Notes (Signed)
[facility] Emergency Department Provider Note  ____________________________________________  Time seen: Approximately 7:48 PM  I have reviewed the triage vital signs and the nursing notes.   HISTORY  Chief Complaint Back Pain    HPI James Yates is a 62 y.o. male presents to the emergency department with 10/10 aching low back pain with bilateral lower extremity radiculopathy.  Patient denies falls or mechanisms of trauma.  He has had symptoms for the past 3 to 4 days.  Patient reports that pain is progressively worsening.  He denies leg weakness but reports that pain is worsened with ambulation and it makes it difficult to walk.  He denies saddle anesthesia or bowel or bladder incontinence.  Patient has been using a lidocaine cream which is not relieving his symptoms.   Past Medical History:  Diagnosis Date  . Aortic valve disease    bicuspid aortic valve with mild stenosis  . Bronchitis   . Coronary artery disease    Non-ST elevation myocardial infarction cardiac catheterization showed significant three-vessel coronary artery disease. 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 Ejection fraction was 60%.  . Glucose intolerance (impaired glucose tolerance)   . Hyperlipidemia   . Hypertension   . MI (myocardial infarction) (HCC)   . Obesity     Patient Active Problem List   Diagnosis Date Noted  . Effort angina (HCC)   . Night sweats 09/29/2015  . Abdominal pain 09/29/2015  . Trouble swallowing 09/29/2015  . Elevated troponin   . Sepsis (HCC) 07/19/2015  . Fatigue 07/06/2015  . Aortic stenosis 06/23/2014  . Leg edema, right 09/02/2013  . Coronary artery disease   . Hyperlipidemia   . Hypertension   . 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 NSTEMI (non-ST elevated myocardial infarction) (HCC) 07/02/2013    Past Surgical History:  Procedure Laterality Date  . CARDIAC CATHETERIZATION   06/2013   [facility]  . CARDIAC CATHETERIZATION N/A 10/29/2015   Procedure: Left Heart Cath and Cors/Grafts Angiography;  Surgeon: Iran OuchMuhammad A Arida, MD;  Location: [facility] INVASIVE CV LAB;  Service: Cardiovascular;  Laterality: N/A;  . CORONARY ARTERY BYPASS GRAFT N/A 07/05/2013   Procedure: CORONARY ARTERY BYPASS GRAFTING (CABG);  Surgeon: Kerin PernaPeter Van Trigt, MD;  [facility];  Service: Open Heart Surgery;  Laterality: N/A;  . INTRAOPERATIVE TRANSESOPHAGEAL ECHOCARDIOGRAM N/A 07/05/2013   Procedure: INTRAOPERATIVE TRANSESOPHAGEAL ECHOCARDIOGRAM;  Surgeon: Kerin PernaPeter Van Trigt, MD;  [facility];  Service: Open Heart Surgery;  Laterality: N/A;  . NO PAST SURGERIES    . RIGHT/LEFT HEART CATH AND CORONARY ANGIOGRAPHY Bilateral 11/27/2017   Procedure: RIGHT/LEFT HEART CATH AND CORONARY ANGIOGRAPHY;  Surgeon: Iran OuchArida, Muhammad A, MD;  Location: [facility] INVASIVE CV LAB;  Service: Cardiovascular;  Laterality: Bilateral;    Prior to Admission medications   Medication Sig Start Date End Date Taking? Authorizing Provider  amLODipine (NORVASC) 5 MG tablet Take 1 tablet (5 mg total) by mouth daily. 03/28/17 01/08/18  Iran OuchArida, Muhammad A, MD  aspirin EC 81 MG tablet Take 1 tablet (81 mg total) by mouth daily. 10/20/15   Iran OuchArida, Muhammad A, MD  atorvastatin (LIPITOR) 80 MG tablet TAKE 1 TABLET (80 MG TOTAL) BY MOUTH DAILY AT 6 PM. 07/07/17   Iran OuchArida, Muhammad A, MD  methocarbamol (ROBAXIN) 500 MG tablet Take 1 tablet (500 mg total) by mouth every 8 (eight) hours as needed for up to 5 days. 01/23/18 01/28/18  Pia Mau M, PA-C  metoprolol tartrate (LOPRESSOR) 50 MG tablet Take 1 tablet (50 mg total) by mouth 2 (two) times daily. 03/28/17 01/08/18  Iran Ouch, MD  potassium chloride (K-DUR) 10 MEQ tablet Take 2 tablets (20 mEq) by mouth the first day, then take 1 tablet (10 mEq) by mouth once a day. 01/09/18   Dunn, Raymon Mutton, PA-C  predniSONE (DELTASONE) 50 MG tablet Take one 50 mg tablet once daily for the next five days. 01/23/18    Orvil Feil, PA-C    Allergies Patient has no known allergies.  Family History  Problem Relation Age of Onset  . Heart disease Father   . Heart attack Father     Social History Social History   Tobacco Use  . Smoking status: Never Smoker  . Smokeless tobacco: Never Used  Substance Use Topics  . Alcohol use: No  . Drug use: No     Review of Systems  Constitutional: No fever/chills Eyes: No visual changes. No discharge ENT: No upper respiratory complaints. Cardiovascular: no chest pain. Respiratory: no cough. No SOB. Gastrointestinal: No abdominal pain.  No nausea, no vomiting.  No diarrhea.  No constipation. Musculoskeletal: Patient has low back pain.  Skin: Negative for rash, abrasions, lacerations, ecchymosis. Neurological: Negative for headaches, focal weakness or numbness.   ____________________________________________   PHYSICAL EXAM:  VITAL SIGNS: ED Triage Vitals  Enc Vitals Group     BP 01/23/18 1907 (!) 150/92     Pulse Rate 01/23/18 1907 (!) 110     Resp 01/23/18 1907 18     Temp 01/23/18 1907 98.7 F (37.1 C)     Temp Source 01/23/18 1907 Oral     SpO2 01/23/18 1907 100 %     Weight 01/23/18 1908 187 lb 6.3 oz (85 kg)     Height 01/23/18 1908 5\' 5"  (1.651 m)     Head Circumference --      Peak Flow --      Pain Score 01/23/18 1908 10     Pain Loc --      Pain Edu? --      Excl. in GC? --      Constitutional: Alert and oriented. Well appearing and in no acute distress. Eyes: Conjunctivae are normal. PERRL. EOMI. Head: Atraumatic. Cardiovascular: Normal rate, regular rhythm. Normal S1 and S2.  Good peripheral circulation. Respiratory: Normal respiratory effort without tachypnea or retractions. Lungs CTAB. Good air entry to the bases with no decreased or absent breath sounds. Gastrointestinal: Bowel sounds 4 quadrants. Soft and nontender to palpation. No guarding or rigidity. No palpable masses. No distention. No CVA  tenderness. Musculoskeletal: Full range of motion to all extremities. No gross deformities appreciated.  5 out of 5 strength in the lower extremities symmetrically.  Patient has paraspinal muscle tenderness along the lumbar spine.  Positive straight leg raise bilaterally.  No weakness with resisted extension of the great toes bilaterally. Neurologic:  Normal speech and language. No gross focal neurologic deficits are appreciated.  Skin:  Skin is warm, dry and intact. No rash noted. Psychiatric: Mood and affect are normal. Speech and behavior are normal. Patient exhibits appropriate insight and judgement.   ____________________________________________   LABS (all labs ordered are listed, but only abnormal results are displayed)  Labs Reviewed - No data to display ____________________________________________  EKG   ____________________________________________  RADIOLOGY I personally viewed and evaluated these images as part of my medical decision making, as well as reviewing the written  report by the radiologist.    Dg Lumbar Spine 2-3 Views  Result Date: 01/23/2018 CLINICAL DATA:  Low back pain EXAM: LUMBAR SPINE - 2-3 VIEW COMPARISON:  CT 12/14/2017 FINDINGS: Degenerative spurring anteriorly in the lower thoracic and upper lumbar spine. Mild wedged appearance of the T12 vertebral body is stable since prior CT. No acute fracture or malalignment. SI joints are symmetric and unremarkable. IMPRESSION: No acute bony abnormality. Electronically Signed   By: Charlett Nose M.D.   On: 01/23/2018 20:46    ____________________________________________    PROCEDURES  Procedure(s) performed:    Procedures    Medications  predniSONE (DELTASONE) tablet 60 mg (has no administration in time range)  ketorolac (TORADOL) 30 MG/ML injection 30 mg (30 mg Intramuscular Given 01/23/18 2000)  methocarbamol (ROBAXIN) tablet 1,000 mg (1,000 mg Oral Given 01/23/18 2000)      ____________________________________________   INITIAL IMPRESSION / ASSESSMENT AND PLAN / ED COURSE  Pertinent labs & imaging results that were available during my care of the patient were reviewed by me and considered in my medical decision making (see chart for details).  Review of the [place] CSRS was performed in accordance of the [facility] prior to dispensing any controlled drugs.      Assessment and Plan:  Low Back Pain Patient presents to the emergency department with low back pain with bilateral lower extremity radiculopathy for the past 3 to 4 days.  Overall neurologic exam is reassuring.  Patient received Toradol, Robaxin and prednisone in the emergency department.  He was discharged with prednisone and Robaxin.  He was referred to Dr. Yves Dill.  Strict return precautions were given to return to the emergency department with new or worsening symptoms.  Patient voiced understanding.  All patient questions were answered.    ____________________________________________  FINAL CLINICAL IMPRESSION(S) / ED DIAGNOSES  Final diagnoses:  Acute bilateral low back pain with bilateral sciatica      NEW MEDICATIONS STARTED DURING THIS VISIT:  ED Discharge Orders         Ordered    methocarbamol (ROBAXIN) 500 MG tablet  Every 8 hours PRN     01/23/18 2112    predniSONE (DELTASONE) 50 MG tablet     01/23/18 2112              This chart was dictated using voice recognition software/Dragon. Despite best efforts to proofread, errors can occur which can change the meaning. Any change was purely unintentional.    Gasper Lloyd 01/23/18 2119    Sharman Cheek, MD 01/28/18 [phone]

## 2018-02-12 ENCOUNTER — Encounter: Payer: Self-pay | Admitting: Thoracic Surgery (Cardiothoracic Vascular Surgery)

## 2018-02-12 ENCOUNTER — Other Ambulatory Visit: Payer: Self-pay

## 2018-02-12 ENCOUNTER — Institutional Professional Consult (permissible substitution): Payer: BLUE CROSS/BLUE SHIELD | Admitting: Thoracic Surgery (Cardiothoracic Vascular Surgery)

## 2018-02-12 VITALS — BP 139/83 | HR 78 | Resp 16 | Ht 65.0 in | Wt 190.8 lb

## 2018-02-12 DIAGNOSIS — I350 Nonrheumatic aortic (valve) stenosis: Secondary | ICD-10-CM

## 2018-02-12 DIAGNOSIS — Z951 Presence of aortocoronary bypass graft: Secondary | ICD-10-CM

## 2018-02-12 NOTE — Patient Instructions (Signed)
Continue all previous medications without any changes at this time  

## 2018-02-12 NOTE — H&P (View-Only) (Signed)
[facility]  MULTIDISCIPLINARY HEART VALVE CLINIC  CARDIOTHORACIC SURGERY CONSULTATION REPORT  Referring Provider is James Ouch, MD PCP is Yates Luis, MD  Chief Complaint  Patient presents with  . Aortic Stenosis    severe...TAVR EVAL ...all studies completed, but PFT not current, needs exercise study    HPI:  Patient is a 62 year old male with history of coronary artery disease status post coronary artery bypass grafting x4 in 2015, bicuspid aortic valve with aortic stenosis, hypertension, and hyperlipidemia who has been referred for a second surgical opinion to discuss treatment options for management of severe symptomatic aortic stenosis and coronary artery disease.  The patient's cardiac history dates back to 2015 when he presented with an acute non-ST segment elevation myocardial infarction.  He was found to have multivessel coronary artery disease and he underwent coronary artery bypass grafting x4 by Yates Yates.  Grafts placed at the time of surgery included left internal mammary artery to the distal left anterior descending coronary artery, saphenous vein graft to the posterior descending coronary artery, and sequential saphenous vein graft to the first and third obtuse marginal branches of the left circumflex system.  TEE performed at the time of surgery revealed bicuspid aortic valve with valve area estimated between 1.0 and 1.2 cm although mean transvalvular gradient was measured only millimeters me.  The patient's postoperative convalescence was uneventful.  The patient has been followed carefully by Dr. Kirke Yates ever since.  The patient states that it took approximately 6 months for him to recover completely but he has continued to experience symptoms of exertional chest pressure and shortness of breath ever since.  In 2017 follow-up catheterization was performed because of symptoms of exertional angina.  Patient was found to have chronic occlusion of  this saphenous vein graft to the left circumflex system with continued patency of the left internal mammary artery to the distal left anterior descending coronary artery and the saphenous vein graft to the posterior descending coronary artery.  There was felt to be moderate aortic stenosis with peak to peak transvalvular gradient reported 22 mmHg.  Follow-up echocardiogram performed Oct 10, 2017 revealed findings suggestive of significant regression of the severity of aortic stenosis with peak velocity across the aortic valve measured a size 4.2 m/s corresponding to mean transvalvular gradient estimated 38 mmHg.  Left ventricular systolic function remain normal.  Patient underwent diagnostic cardiac catheterization on November 27, 2017.  By catheterization mean transvalvular gradient was measured 26 mmHg.  Coronary angiography revealed no further progression of disease with continued patency of the left internal mammary artery to the distal left anterior descending coronary artery and saphenous vein graft to the posterior descending coronary artery.  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  The terminal branches of left circumflex filled via left to left collaterals.  The patient was referred for surgical consultation and has been evaluated previously by Yates Yates who recommended transcatheter aortic valve replacement.  The patient was seen by Dr. Clifton Yates on December 25, 2017.  For a variety of reasons the patient desires to hold off on intervention at that time and he has now been referred for a second surgical opinion.  Patient is originally from [place] but has lived in the [place] for over 35 years.  He lives in [place] with his wife and 1 son.  The patient works as a Engineer, production.  He complains  of slowly progressive symptoms of exertional shortness of breath and chest pressure.  Symptoms occur only with  activity but currently, and with relatively low level activity such as just walking for short distance.  Symptoms are probably relieved by rest.  Patient denies nocturnal angina or shortness of breath.  He states that he only occasionally has symptoms of chest discomfort at rest.  He denies any history of PND, orthopnea, or lower extremity edema.  He has not had tachypalpitations, dizzy spells, nor syncope.  He has no other significant physical limitations, and his symptoms of exertional chest pressure and shortness of breath keep him from doing much of his ordinary daily activities.    Past Medical History:  Diagnosis Date  . Aortic stenosis due to bicuspid aortic valve   . Bronchitis   . Coronary artery disease    Non-ST elevation myocardial infarction cardiac catheterization showed significant three-vessel coronary artery disease. 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 Ejection fraction was 60%.  . Glucose intolerance (impaired glucose tolerance)   . Hyperlipidemia   . Hypertension   . MI (myocardial infarction) (HCC)   . Obesity     Past Surgical History:  Procedure Laterality Date  . CARDIAC CATHETERIZATION  06/2013   [facility]  . CARDIAC CATHETERIZATION N/A 10/29/2015   Procedure: Left Heart Cath and Cors/Grafts Angiography;  Surgeon: James Ouch, MD;  Location: [facility] INVASIVE CV LAB;  Service: Cardiovascular;  Laterality: N/A;  . CORONARY ARTERY BYPASS GRAFT N/A 07/05/2013   Procedure: CORONARY ARTERY BYPASS GRAFTING (CABG);  Surgeon: James Perna, MD;  Location: [facility];  Service: Open Heart Surgery;  Laterality: N/A;  . INTRAOPERATIVE TRANSESOPHAGEAL ECHOCARDIOGRAM N/A 07/05/2013   Procedure: INTRAOPERATIVE TRANSESOPHAGEAL ECHOCARDIOGRAM;  Surgeon: James Perna, MD;  Location[facility];  Service: Open Heart Surgery;  Laterality: N/A;  . NO PAST SURGERIES    . RIGHT/LEFT HEART CATH AND CORONARY ANGIOGRAPHY Bilateral  11/27/2017   Procedure: RIGHT/LEFT HEART CATH AND CORONARY ANGIOGRAPHY;  Surgeon: James Ouch, MD;  Location: [facility] INVASIVE CV LAB;  Service: Cardiovascular;  Laterality: Bilateral;    Family History  Problem Relation Age of Onset  . Heart disease Father   . Heart attack Father     Social History   Socioeconomic History  . Marital status: Married    Spouse name: Not on file  . Number of children: Not on file  . Years of education: Not on file  . Highest education level: Not on file  Occupational History  . Not on file  Social Needs  . Financial resource strain: Not on file  . Food insecurity:    Worry: Not on file    Inability: Not on file  . Transportation needs:    Medical: Not on file    Non-medical: Not on file  Tobacco Use  . Smoking status: Never Smoker  . Smokeless tobacco: Never Used  Substance and Sexual Activity  . Alcohol use: No  . Drug use: No  . Sexual activity: Not on file  Lifestyle  . Physical activity:    Days per week: Not on file    Minutes per session: Not on file  . Stress: Not on file  Relationships  . Social connections:    Talks on phone: Not on file    Gets together: Not on file    Attends religious service: Not on file  Active member of club or organization: Not on file    Attends meetings of clubs or organizations: Not on file    Relationship status: Not on file  . Intimate partner violence:    Fear of current or ex partner: Not on file    Emotionally abused: Not on file    Physically abused: Not on file    Forced sexual activity: Not on file  Other Topics Concern  . Not on file  Social History Narrative  . Not on file    Current Outpatient Medications  Medication Sig Dispense Refill  . amLODipine (NORVASC) 5 MG tablet Take 1 tablet (5 mg total) by mouth daily. 90 tablet 3  . aspirin EC 81 MG tablet Take 1 tablet (81 mg total) by mouth daily. 30 tablet 3  . atorvastatin (LIPITOR) 80 MG tablet TAKE 1 TABLET (80 MG TOTAL)  BY MOUTH DAILY AT 6 PM. 90 tablet 3  . metoprolol tartrate (LOPRESSOR) 50 MG tablet Take 1 tablet (50 mg total) by mouth 2 (two) times daily. 180 tablet 3  . potassium chloride (K-DUR) 10 MEQ tablet Take 2 tablets (20 mEq) by mouth the first day, then take 1 tablet (10 mEq) by mouth once a day. 30 tablet 3   No current facility-administered medications for this visit.     No Known Allergies    Review of Systems:   General:  normal appetite, decreased energy, no weight gain, no weight loss, no fever  Cardiac:  + chest pain with exertion, no chest pain at rest, + SOB with exertion, no resting SOB, no PND, no orthopnea, no palpitations, no arrhythmia, no atrial fibrillation, no LE edema, no dizzy spells, no syncope  Respiratory:  + exertional shortness of breath, no home oxygen, no productive cough, no dry cough, no bronchitis, no wheezing, no hemoptysis, no asthma, no pain with inspiration or cough, no sleep apnea, no CPAP at night  GI:   + occasional difficulty swallowing, no reflux, no frequent heartburn, no hiatal hernia, no abdominal pain, no constipation, no diarrhea, no hematochezia, no hematemesis, no melena  GU:   no dysuria,  no frequency, no urinary tract infection, no hematuria, no enlarged prostate, no kidney stones, no kidney disease  Vascular:  no pain suggestive of claudication, no pain in feet, no leg cramps, no varicose veins, no DVT, no non-healing foot ulcer  Neuro:   no stroke, no TIA's, no seizures, no headaches, no temporary blindness one eye,  no slurred speech, no peripheral neuropathy, no chronic pain, no instability of gait, no memory/cognitive dysfunction  Musculoskeletal: no arthritis, no joint swelling, no myalgias, no difficulty walking, normal mobility   Skin:   no rash, no itching, no skin infections, no pressure sores or ulcerations  Psych:   no anxiety, no depression, no nervousness, no unusual recent stress  Eyes:   no blurry vision, no floaters, no recent  vision changes, + wears glasses or contacts  ENT:   no hearing loss, no loose or painful teeth, no dentures, last saw dentist within the past year  Hematologic:  no easy bruising, no abnormal bleeding, no clotting disorder, no frequent epistaxis  Endocrine:  no diabetes, does not check CBG's at [facility]           Physical Exam:   BP 139/83 (BP Location: Right Leg, Patient Position: Sitting, Cuff Size: Large)   Pulse 78   Resp 16   Ht 5\' 5"  (1.651 m)   Wt 190 lb 12.8  oz (86.5 kg)   SpO2 97% Comment: ON RA  BMI 31.75 kg/m   General:  Mildly obese,  well-appearing  HEENT:  Unremarkable   Neck:   no JVD, no bruits, no adenopathy   Chest:   clear to auscultation, symmetrical breath sounds, no wheezes, no rhonchi   CV:   RRR, grade III/VI crescendo/decrescendo murmur heard best at RSB,  no diastolic murmur  Abdomen:  soft, non-tender, no masses   Extremities:  warm, well-perfused, pulses palpable, no LE edema  Rectal/GU  Deferred  Neuro:   Grossly non-focal and symmetrical throughout  Skin:   Clean and dry, no rashes, no breakdown   Diagnostic Tests:  Transthoracic Echocardiography  Patient:    Yates LevanMaldonado, [place] MR #:       [id number] Study Date: 10/10/2017 Gender:     M Age:        [age] Height:     165.1 cm Weight:     89 kg BSA:        2.05 m^2 Pt. Status: Room:   ATTENDING    Default, Provider [phone]  Lessie DingsDERING     Muhammad Arida, MD  REFERRING    Lorine BearsMuhammad Arida, MD  PERFORMING   [facility] [place]  SONOGRAPHER  Quentin OreGary Joseph, RVT, RDCS, RDMS  cc:  ------------------------------------------------------------------- LV EF: 60% -   65%  ------------------------------------------------------------------- History:   PMH:   Dyspnea.  Angina pectoris.  Coronary artery disease.  Aortic valve disease.  Risk factors:  Lifelong nonsmoker. Hypertension. Dyslipidemia.  ------------------------------------------------------------------- Study Conclusions  - Left  ventricle: The cavity size was normal. Systolic function was   normal. The estimated ejection fraction was in the range of 60%   to 65%. Wall motion was normal; there were no regional wall   motion abnormalities. Left ventricular diastolic function   parameters were normal. - Aortic valve: Transvalvular velocity was increased. There was   severe stenosis. Peak velocity (S): 422 cm/s. Mean gradient (S):   38 mm Hg. Peak gradient (S): 71 mm Hg. - Left atrium: The atrium was normal in size. - Right ventricle: Systolic function was normal. - Pulmonary arteries: Systolic pressure was mildly elevated. PA   peak pressure: 46 mm Hg (S).  Impressions:  - Significant increase in aortic valve gradient compared to prior   study in 2018. Consider TEE or other study/cardiac cath for   verification if clinically indicated.  ------------------------------------------------------------------- Labs, prior tests, procedures, and surgery: Coronary artery bypass grafting.  ------------------------------------------------------------------- Study data:  The previous study was not available, so comparison was made to the report of March 2018.  Study status:  Routine. Procedure:  Transthoracic echocardiography. Image quality was good. Intravenous contrast (Definity) was administered.  Study completion:  There were no complications.          Transthoracic echocardiography.  M-mode, complete 2D, spectral Doppler, and color Doppler.  Birthdate:  Patient birthdate: 17-Jan-1956.  Age:  Patient is 62 yr old.  Sex:  Gender: male.    BMI: 32.7 kg/m^2.  Blood pressure:     138/86  Patient status:  Outpatient.  Study date: Study date: 10/10/2017. Study time: 11:51 AM.  -------------------------------------------------------------------  ------------------------------------------------------------------- Left ventricle:  The cavity size was normal. Systolic function was normal. The estimated ejection  fraction was in the range of 60% to 65%. Wall motion was normal; there were no regional wall motion abnormalities. The transmitral flow pattern was normal. The deceleration time of the early transmitral flow velocity was normal. The pulmonary vein flow pattern  was normal. The tissue Doppler parameters were normal. Left ventricular diastolic function parameters were normal.  ------------------------------------------------------------------- Aortic valve:   Trileaflet; normal thickness, severely calcified leaflets. Mobility was not restricted.  Doppler:  Transvalvular velocity was increased. There was severe stenosis. There was no regurgitation.    VTI ratio of LVOT to aortic valve: 0.24. Valve area (VTI): 0.6 cm^2. Indexed valve area (VTI): 0.29 cm^2/m^2. Mean velocity ratio of LVOT to aortic valve: 0.26. Valve area (Vmean): 0.66 cm^2. Indexed valve area (Vmean): 0.32 cm^2/m^2.    Mean gradient (S): 38 mm Hg. Peak gradient (S): 71 mm Hg.  ------------------------------------------------------------------- Aorta:  Aortic root: The aortic root was normal in size.  ------------------------------------------------------------------- Mitral valve:   Structurally normal valve.   Mobility was not restricted.  Doppler:  Transvalvular velocity was within the normal range. There was no evidence for stenosis. There was no regurgitation.    Valve area by pressure half-time: 3.19 cm^2. Indexed valve area by pressure half-time: 1.55 cm^2/m^2.    Peak gradient (D): 4 mm Hg.  ------------------------------------------------------------------- Left atrium:  The atrium was normal in size.  ------------------------------------------------------------------- Right ventricle:  The cavity size was normal. Wall thickness was normal. Systolic function was normal.  ------------------------------------------------------------------- Pulmonic valve:    Structurally normal valve.   Cusp separation  was normal.  Doppler:  Transvalvular velocity was within the normal range. There was no evidence for stenosis. There was no regurgitation.  ------------------------------------------------------------------- Tricuspid valve:   Structurally normal valve.    Doppler: Transvalvular velocity was within the normal range. There was mild regurgitation.  ------------------------------------------------------------------- Pulmonary artery:   The main pulmonary artery was normal-sized. Systolic pressure was mildly elevated.  ------------------------------------------------------------------- Right atrium:  The atrium was normal in size.  ------------------------------------------------------------------- Pericardium:  There was no pericardial effusion.  ------------------------------------------------------------------- Systemic veins: Inferior vena cava: The vessel was normal in size.  ------------------------------------------------------------------- Measurements   Left ventricle                           Value          Reference  LV ID, ED, PLAX chordal                  49    mm       43 - 52  LV ID, ES, PLAX chordal                  31    mm       23 - 38  LV fx shortening, PLAX chordal           37    %        >=29  LV PW thickness, ED                      9     mm       ----------  IVS/LV PW ratio, ED                      0.89           <=1.3  Stroke volume, 2D                        49    ml       ----------  Stroke volume/bsa, 2D  24    ml/m^2   ----------  LV ejection fraction, 1-p A4C            68    %        ----------  LV e&', lateral                           5.77  cm/s     ----------  LV E/e&', lateral                         16.22          ----------  LV e&', medial                            121   cm/s     ----------  LV E/e&', medial                          0.77           ----------  LV e&', average                           63.39 cm/s      ----------  LV E/e&', average                         1.48           ----------    Ventricular septum                       Value          Reference  IVS thickness, ED                        8     mm       ----------    LVOT                                     Value          Reference  LVOT ID, S                               18    mm       ----------  LVOT area                                2.54  cm^2     ----------  LVOT ID                                  18    mm       ----------  LVOT mean velocity, S                    74.8  cm/s     ----------  LVOT VTI, S  19.4  cm       ----------  Stroke volume (SV), LVOT DP              49.4  ml       ----------  Stroke index (SV/bsa), LVOT DP           24    ml/m^2   ----------    Aortic valve                             Value          Reference  Aortic valve peak velocity, S            422   cm/s     ----------  Aortic valve mean velocity, S            288   cm/s     ----------  Aortic valve VTI, S                      82    cm       ----------  Aortic mean gradient, S                  38    mm Hg    ----------  Aortic peak gradient, S                  71    mm Hg    ----------  VTI ratio, LVOT/AV                       0.24           ----------  Aortic valve area, VTI                   0.6   cm^2     ----------  Aortic valve area/bsa, VTI               0.29  cm^2/m^2 ----------  Velocity ratio, mean, LVOT/AV            0.26           ----------  Aortic valve area, mean velocity         0.66  cm^2     ----------  Aortic valve area/bsa, mean              0.32  cm^2/m^2 ----------  velocity    Aorta                                    Value          Reference  Aortic root ID, ED                       31    mm       ----------  Ascending aorta ID, A-P, S               28    mm       ----------    Left atrium                              Value          Reference  LA ID, A-P, ES  33    mm        ----------  LA ID/bsa, A-P                           1.61  cm/m^2   <=2.2  LA volume, S                             38.5  ml       ----------  LA volume/bsa, S                         18.7  ml/m^2   ----------  LA volume, ES, 1-p A4C                   35.7  ml       ----------  LA volume/bsa, ES, 1-p A4C               17.4  ml/m^2   ----------  LA volume, ES, 1-p A2C                   40.9  ml       ----------  LA volume/bsa, ES, 1-p A2C               19.9  ml/m^2   ----------    Mitral valve                             Value          Reference  Mitral E-wave peak velocity              93.6  cm/s     ----------  Mitral A-wave peak velocity              78.6  cm/s     ----------  Mitral deceleration time         (H)     236   ms       150 - 230  Mitral pressure half-time                69    ms       ----------  Mitral peak gradient, D                  4     mm Hg    ----------  Mitral E/A ratio, peak                   1.2            ----------  Mitral valve area, PHT, DP               3.19  cm^2     ----------  Mitral valve area/bsa, PHT, DP           1.55  cm^2/m^2 ----------    Pulmonary arteries                       Value          Reference  PA pressure, S, DP               (H)     46    mm Hg    <=30    Tricuspid valve  Value          Reference  Tricuspid regurg peak velocity           320   cm/s     ----------  Tricuspid peak RV-RA gradient            41    mm Hg    ----------    Right atrium                             Value          Reference  RA ID, S-I, ES, A4C              (H)     49.3  mm       34 - 49  RA area, ES, A4C                         14.5  cm^2     8.3 - 19.5  RA volume, ES, A/L                       33.4  ml       ----------  RA volume/bsa, ES, A/L                   16.3  ml/m^2   ----------    Right ventricle                          Value          Reference  TAPSE                                    15.9  mm       ----------  RV  s&', lateral, S                        8.27  cm/s     ----------    Pulmonic valve                           Value          Reference  Pulmonic valve peak velocity, S          83.4  cm/s     ----------  Legend: (L)  and  (H)  mark values outside specified reference range.  ------------------------------------------------------------------- Prepared and Electronically Authenticated by  Dossie Arbour, MD, [facility] 2019-05-15T18:41:13   RIGHT/LEFT HEART CATH AND CORONARY ANGIOGRAPHY  Conclusion     Ost LAD to Mid LAD lesion is 80% stenosed.  Dist LAD lesion is 30% stenosed.  Ost 1st Diag to 1st Diag lesion is 70% stenosed.  Ost 2nd Diag to 2nd Diag lesion is 100% stenosed.  Prox RCA lesion is 30% stenosed.  Mid RCA lesion is 100% stenosed.  Prox Graft lesion before Ramus is 100% stenosed.  The graft exhibits no disease.  LIMA.  The graft exhibits no disease.  Prox Cx to Mid Cx lesion is 100% stenosed.  Origin lesion is 100% stenosed.   1.  Significant underlying three-vessel coronary artery disease with patent LIMA to LAD (could not be selectively engaged), patent SVG to RCA and known occluded SVG to OM 3.  Occluded native left circumflex. 2.  Moderate to severe aortic stenosis on current hemodynamics with a mean gradient of 26 mmHg and valve area of 1.2.  However, his aortic stenosis is likely severe based on echocardiogram findings.  Aortic valve area today is overestimated due to high cardiac output.  Valve area by echocardiogram was 0.6.  Valve morphology is also consistent with severe stenosis. 3.  Right heart catheterization showed mildly elevated filling pressures with mild pulmonary hypertension.  Recommendations:  Recommend aortic valve replacement +1 vessel CABG to OM1 (which is the biggest of OM branches).    Indications   Aortic valve stenosis, etiology of cardiac valve disease unspecified [I35.0 (ICD-10-CM)]  Procedural Details/Technique   Technical  Details Procedural details: The right groin was prepped, draped, and anesthetized with 1% lidocaine. Using modified Seldinger technique, a 5 French sheath was introduced into the right femoral artery. A 7 French sheath was placed in the right femoral vein. Right heart catheterization was performed with a 7 French Swan-Ganz catheter. Standard Judkins catheters were used for coronary angiography and LV pressure. The JR4 was used to engage the SVG to OM. It was also used to perform LIMA angiography. The LIMA could not be selectively engaged due to significant tortuosity. A multipurpose catheter was used to engage the SVG to RCA. Catheter exchanges were performed over a guidewire. There were no immediate procedural complications. The patient was transferred to the post catheterization recovery area for further monitoring. A Mynx closure device was used for femoral artery hemostasis.     Estimated blood loss <50 mL.  During this procedure the patient was administered the following to achieve and maintain moderate conscious sedation: Versed 1 mg, Fentanyl 25 mcg, while the patient's heart rate, blood pressure, and oxygen saturation were continuously monitored. The period of conscious sedation was 34 minutes, of which I was present face-to-face 100% of this time.  Coronary Findings   Diagnostic  Dominance: Right  Left Main  Vessel is angiographically normal.  Left Anterior Descending  Ost LAD to Mid LAD lesion 80% stenosed  Ost LAD to Mid LAD lesion is 80% stenosed.  Dist LAD lesion 30% stenosed  Dist LAD lesion is 30% stenosed.  First Diagonal Branch  Ost 1st Diag to 1st Diag lesion 70% stenosed  Ost 1st Diag to 1st Diag lesion is 70% stenosed.  Second Diagonal [employer] 2nd Diag to 2nd Diag lesion 100% stenosed  Ost 2nd Diag to 2nd Diag lesion is 100% stenosed.  Left Circumflex  Prox Cx to Mid Cx lesion 100% stenosed  Prox Cx to Mid Cx lesion is 100% stenosed.  Third Obtuse Marginal Branch    Collaterals  3rd Mrg filled by collaterals from 1st RPLB.    Right Coronary Artery  Prox RCA lesion 30% stenosed  Prox RCA lesion is 30% stenosed.  Mid RCA lesion 100% stenosed  Mid RCA lesion is 100% stenosed.  Sequential single graft Graft to Ramus, 2nd Mrg  Prox Graft lesion before Ramus 100% stenosed  Prox Graft lesion before Ramus is 100% stenosed.  single graft Graft to RPDA  The graft exhibits no disease.  LIMA LIMA Graft to Dist LAD  LIMA. The graft exhibits no disease.  Graft to Ost 3rd Mrg  Origin lesion 100% stenosed  Origin lesion is 100% stenosed.  Intervention   No interventions have been documented.  Coronary Diagrams   Diagnostic Diagram       Implants    Vascular Products  Device Closure Mynxgrip  106f - [id number] - Implanted    Inventory item: DEVICE CLOSURE MYNXGRIP 51F Model/Cat number: [id number]  Manufacturer: [employer] Lot number: [id number]  Device identifier: [id number] Device identifier type: GS1  Area Of Implantation: Groin    GUDID Information   Request status Successful    Brand name: [facility] Version/Model: [id number]  Company name: [employer] MRI safety info as of 11/27/17: Labeling does not contain MRI Safety Information  Contains dry or latex rubber: No    GMDN P.T. name: Wound hydrogel dressing, sterile    As of 11/27/2017   Status: Implanted      MERGE Images   Show images for CARDIAC CATHETERIZATION   Link to Procedure Log   Procedure Log    Hemo Data 11/27/17 1123--11/27/17 1229 before discharge   AO Systolic Cath Pressure AO Diastolic Cath Pressure AO Mean Cath Pressure LV Systolic Cath Pressure LV End Diastolic PA Systolic Cath Pressure PA Diastolic Cath Pressure PA Mean Cath Pressure RV Systolic Cath Pressure RV Diastolic Cath Pressure RV End Diastolic AO O2 Sat PA O2 Sat AO O2 Sat  - - - - - - - - 42 mmHg 8 mmHg 15 mmHg - - -  - - - - - 40 mmHg 17 mmHg 26 mmHg - - - - - -  131 74 mmHg 96 mmHg - - - - - - - - - - -   - - - 162 mmHg 20 mmHg - - - - - - - - -  - - - 160 mmHg 18 mmHg - - - - - - - - -  - - - 159 mmHg 18 mmHg - - - - - - - - -  136 81 mmHg 105 mmHg - - - - - - - - - - -  136 74 mmHg 95 mmHg - - - - - - - - - - -  - - - - - - - - - - - 97.2 % - SA  - - - - - - - - - - - - MV -  130 80 mmHg 100 mmHg - - - -            Cardiac TAVR CT  TECHNIQUE: The patient was scanned on a [employer]. A 120 kV retrospective scan was triggered in the descending thoracic aorta at 111 HU's. Gantry rotation speed was 250 msecs and collimation was .6 mm. No beta blockade or nitro were given. The 3D data set was reconstructed in 5% intervals of the R-R cycle. Systolic and diastolic phases were analyzed on a dedicated work station using MPR, MIP and VRT modes. The patient received 80 cc of contrast.  FINDINGS: Aortic Valve: Trileaflet aortic valve with partially co-joined left and right leaflets. Leaflets are moderately thickened and calcified leaflets with moderately restricted leaflet excursions and no calcifications are extending into the LVOT.  Aorta: Normal size with mild diffuse calcifications and atheroma and no dissection.  Sinotubular Junction: 27 x 24 mm  Ascending Thoracic Aorta: 32 x 31 mm  Aortic Arch: 26 x 24 mm  Descending Thoracic Aorta: 24 x 23 mm  Sinus of Valsalva Measurements:  Non-coronary: 30 mm  Right -coronary: 33 mm  Left -coronary: 33 mm  Coronary Artery Height above Annulus:  Left Main: 16 mm  Right Coronary: 18 mm  Virtual Basal Annulus Measurements:  Maximum/Minimum Diameter: 26.7 x 20.9 mm  Mean Diameter: 23.2 mm  Perimeter: 74.3 mm  Area: 422 mm2  Optimum Fluoroscopic Angle for Delivery: RAO 5 CAU 3  IMPRESSION: 1. Trileaflet aortic valve with partially co-joined left and right leaflets. Leaflets are moderately thickened and calcified leaflets with moderately restricted leaflet excursions and no  calcifications are extending into the LVOT. Annular measurements suitable for delivery of a 23 mm Edwards-SAPIEN 3 valve.  2. Sufficient coronary to annulus distance.  3. Optimum Fluoroscopic Angle for Delivery:  RAO 5 CAU 3  4. No thrombus in the left atrial appendage.  Electronically Signed: By: Tobias Alexander On: 12/14/2017 15:42   CT ANGIOGRAPHY CHEST, ABDOMEN AND PELVIS  TECHNIQUE: Multidetector CT imaging through the chest, abdomen and pelvis was performed using the standard protocol during bolus administration of intravenous contrast. Multiplanar reconstructed images and MIPs were obtained and reviewed to evaluate the vascular anatomy.  CONTRAST:  ISOVUE-370 IOPAMIDOL (ISOVUE-370) INJECTION 76%  COMPARISON:  None.  FINDINGS: CTA CHEST FINDINGS  Cardiovascular: Heart size is mildly enlarged. There is no significant pericardial fluid, thickening or pericardial calcification. There is aortic atherosclerosis, as well as atherosclerosis of the great vessels of the mediastinum and the coronary arteries, including calcified atherosclerotic plaque in the left anterior descending, left circumflex and right coronary arteries. Status post median sternotomy for CABG including LIMA to the LAD. Severe thickening calcification of the aortic valve.  Mediastinum/Lymph Nodes: No pathologically enlarged mediastinal or hilar lymph nodes. Esophagus is unremarkable in appearance. No axillary lymphadenopathy.  Lungs/Pleura: No suspicious appearing pulmonary nodules or masses are noted. No acute consolidative airspace disease. No pleural effusions. Areas of scarring and atelectasis are noted in the right middle lobe and left upper lobe.  Musculoskeletal/Soft Tissues: Median sternotomy wires. There are no aggressive appearing lytic or blastic lesions noted in the visualized portions of the skeleton.  CTA ABDOMEN AND PELVIS FINDINGS  Hepatobiliary: No  suspicious cystic or solid hepatic lesions. No intra or extrahepatic biliary ductal dilatation. Gallbladder is normal in appearance.  Pancreas: No pancreatic mass. No pancreatic ductal dilatation. No pancreatic or peripancreatic fluid or inflammatory changes.  Spleen: Unremarkable.  Adrenals/Urinary Tract: Bilateral kidneys and bilateral adrenal glands are normal in appearance. No hydroureteronephrosis. Urinary bladder is normal in appearance.  Stomach/Bowel: Normal appearance of the stomach. No pathologic dilatation of the small bowel or colon. Normal appendix.  Vascular/Lymphatic: Aortic atherosclerosis with vascular findings and measurements pertinent to potential TAVR procedure, as detailed below. Celiac axis, superior mesenteric artery and inferior mesenteric artery are all widely patent without hemodynamically significant stenosis. Single right and 2 left renal arteries are all widely patent without hemodynamically significant stenosis. There are no aggressive appearing lytic or blastic lesions noted in the visualized portions of the skeleton.  Reproductive: Prostate gland and seminal vesicles are unremarkable in appearance.  Other: No significant volume of ascites.  No pneumoperitoneum.  Musculoskeletal: There are no aggressive appearing lytic or blastic lesions noted in the visualized portions of the skeleton.  VASCULAR MEASUREMENTS PERTINENT TO TAVR:  AORTA:  Minimal Aortic Diameter-15 x 15 mm  Severity of Aortic Calcification-minimal  RIGHT PELVIS:  Right Common Iliac Artery -  Minimal Diameter-9.1 x 8.0 mm  Tortuosity-mild  Calcification-moderate  Right External Iliac Artery -  Minimal Diameter-7.1 x 6.7 mm  Tortuosity-moderate  Calcification-none  Right Common Femoral Artery -  Minimal Diameter-5.4 x 4.3 mm  Tortuosity-mild  Calcification-mild  LEFT PELVIS:  Left Common Iliac Artery -  Minimal Diameter-7.5  x 6.7 mm  Tortuosity-mild  Calcification-mild  Left External Iliac Artery -  Minimal Diameter-7.8 x 7.6 mm  Tortuosity-mild  Calcification-none  Left Common Femoral Artery -  Minimal Diameter-6.7 x 5.7 mm  Tortuosity-mild  Calcification-mild  Review of the MIP images confirms the above findings.  IMPRESSION: 1. Vascular findings and measurements pertinent to potential TAVR procedure, as detailed above. 2. Severe thickening calcification of the aortic valve, compatible with the reported clinical history of severe aortic stenosis. 3. Aortic atherosclerosis, in addition to 3 vessel coronary artery disease. Status post median sternotomy for CABG including LIMA to the LAD. 4. Additional incidental findings, as above.   Electronically Signed   By: Trudie Reed M.D.   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  He describes progressive symptoms of exertional shortness of breath and chest pressure consistent with exertional angina and chronic diastolic congestive heart failure, New York Heart Association functional class IIb.  I have personally reviewed the patient's most recent transthoracic echocardiogram, diagnostic cardiac catheterization, and CT angiograms.  The echocardiogram performed in May 2019 is relatively poor quality but demonstrates the presence of a Sievers type I bicuspid aortic valve.  Peak velocity across the aortic valve ranged between 3.2 and 4.2 m/s corresponding to mean transvalvular gradient ranging between 26 and 38 mmHg, respectively.  Left ventricular systolic function remains normal.  Sound transmission was somewhat poor.  Diagnostic cardiac catheterization confirmed the presence of aortic stenosis and reveals severe left main and three-vessel coronary artery disease  with significant vein graft disease.  There remains continued patency of left internal mammary artery graft to the distal left anterior descending coronary artery and saphenous vein graft to the posterior descending coronary artery.  Native left circumflex coronary artery is chronically occluded and the vein graft to the left circumflex system is chronically occluded.  There is a large obtuse marginal branch of the left circumflex which fills via left to left collaterals.  I agree the patient would likely benefit from aortic valve replacement with or without redo coronary artery bypass grafting.  Risks associated with conventional surgery would be relatively low but somewhat elevated because of the patient's previous bypass surgery.  Conventional surgery would offer the potential for redo coronary artery bypass grafting to the large obtuse marginal branch of the left circumflex system, but this vessel might be difficult to graft because of its location behind the patent left internal mammary artery graft and the patient's significant left ventricular hypertrophy.  Cardiac-gated CTA of the heart reveals anatomical characteristics consistent with aortic stenosis suitable for treatment by transcatheter aortic valve replacement without any significant complicating features and CTA of the aorta and iliac vessels demonstrate what appears to be adequate pelvic vascular access to facilitate a transfemoral approach.  On careful review of the patient's cardiac gated CT angiogram I suspect that the reported measurements might be somewhat low and the patient might have an aortic annulus large enough to accept a 26 mm Edwards Sapien 3 transcatheter heart valve.    Plan:  The patient and his son were counseled at length with an interpreter regarding treatment alternatives for management of severe symptomatic aortic stenosis and coronary artery disease. Alternative approaches such as conventional aortic valve replacement  with or without redo coronary artery bypass grafting, transcatheter aortic valve replacement, and continued medical therapy without intervention were compared and contrasted at length.  The risks associated with conventional surgery were discussed in detail, as were expectations for post-operative convalescence.  Issues specific to transcatheter aortic valve replacement were discussed including questions about long term valve durability, the potential for paravalvular leak, possible increased risk of need for permanent pacemaker placement, and other technical complications related to the procedure itself.  Long-term prognosis with medical therapy was discussed. This discussion was placed in the context of the patient's own specific clinical presentation and past medical history.  All of their questions have been addressed.  The patient desires to proceed with transcatheter aortic valve replacement in the near future.  We will proceed with trans-thoracic echocardiogram as soon as practical because of the relatively poor sound transmission and quality of the echocardiogram performed last May.  We tentatively plan to proceed with transcatheter aortic valve replacement on March 13, 2018.  Following the decision to proceed with transcatheter aortic valve replacement, a discussion has been held regarding what types of management strategies would be attempted intraoperatively in the event of life-threatening complications, including whether or not the patient would be considered a candidate for the use of cardiopulmonary bypass and/or conversion to open sternotomy for attempted surgical intervention.  The patient has been advised of a variety of complications that might develop including but not limited to risks of death, stroke, paravalvular leak, aortic dissection or other major vascular complications, aortic annulus rupture, device embolization, cardiac rupture or perforation, mitral regurgitation, acute  myocardial infarction, arrhythmia, heart block or bradycardia requiring permanent pacemaker placement, congestive heart failure, respiratory failure, renal failure, pneumonia, infection, other late complications related to structural valve deterioration or migration, or other complications that might ultimately cause a temporary or permanent loss of functional independence or other long term morbidity.  The patient provides full informed consent for the procedure as described and all questions were answered.   I spent in excess of 90 minutes during the conduct of this office consultation and >50% of this time involved direct face-to-face encounter with the patient for counseling and/or coordination of their care.   Salvatore Decent. Cornelius Moras, MD 02/12/2018 9:40 AM

## 2018-02-12 NOTE — Progress Notes (Signed)
[facility]  MULTIDISCIPLINARY HEART VALVE CLINIC  CARDIOTHORACIC SURGERY CONSULTATION REPORT  Referring Provider is Arida, Muhammad A, MD PCP is Sonnenberg, Eric G, MD  Chief Complaint  Patient presents with  . Aortic Stenosis    severe...TAVR EVAL ...all studies completed, but PFT not current, needs exercise study    HPI:  Patient is a 62-year-old male with history of coronary artery disease status post coronary artery bypass grafting x4 in 2015, bicuspid aortic valve with aortic stenosis, hypertension, and hyperlipidemia who has been referred for a second surgical opinion to discuss treatment options for management of severe symptomatic aortic stenosis and coronary artery disease.  The patient's cardiac history dates back to 2015 when he presented with an acute non-ST segment elevation myocardial infarction.  He was found to have multivessel coronary artery disease and he underwent coronary artery bypass grafting x4 by Dr. Van Trigt.  Grafts placed at the time of surgery included left internal mammary artery to the distal left anterior descending coronary artery, saphenous vein graft to the posterior descending coronary artery, and sequential saphenous vein graft to the first and third obtuse marginal branches of the left circumflex system.  TEE performed at the time of surgery revealed bicuspid aortic valve with valve area estimated between 1.0 and 1.2 cm although mean transvalvular gradient was measured only millimeters me.  The patient's postoperative convalescence was uneventful.  The patient has been followed carefully by Dr. Arida ever since.  The patient states that it took approximately 6 months for him to recover completely but he has continued to experience symptoms of exertional chest pressure and shortness of breath ever since.  In 2017 follow-up catheterization was performed because of symptoms of exertional angina.  Patient was found to have chronic occlusion of  this saphenous vein graft to the left circumflex system with continued patency of the left internal mammary artery to the distal left anterior descending coronary artery and the saphenous vein graft to the posterior descending coronary artery.  There was felt to be moderate aortic stenosis with peak to peak transvalvular gradient reported 22 mmHg.  Follow-up echocardiogram performed Oct 10, 2017 revealed findings suggestive of significant regression of the severity of aortic stenosis with peak velocity across the aortic valve measured a size 4.2 m/s corresponding to mean transvalvular gradient estimated 38 mmHg.  Left ventricular systolic function remain normal.  Patient underwent diagnostic cardiac catheterization on November 27, 2017.  By catheterization mean transvalvular gradient was measured 26 mmHg.  Coronary angiography revealed no further progression of disease with continued patency of the left internal mammary artery to the distal left anterior descending coronary artery and saphenous vein graft to the posterior descending coronary artery.  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  The terminal branches of left circumflex filled via left to left collaterals.  The patient was referred for surgical consultation and has been evaluated previously by Dr. Van Trigt who recommended transcatheter aortic valve replacement.  The patient was seen by Dr. McAlhany on December 25, 2017.  For a variety of reasons the patient desires to hold off on intervention at that time and he has now been referred for a second surgical opinion.  Patient is originally from [name] but has lived in the [place] for over 35 years.  He lives in [name] with his wife and 1 son.  The patient works as a baker.  He complains   of slowly progressive symptoms of exertional shortness of breath and chest pressure.  Symptoms occur only with  activity but currently, and with relatively low level activity such as just walking for short distance.  Symptoms are probably relieved by rest.  Patient denies nocturnal angina or shortness of breath.  He states that he only occasionally has symptoms of chest discomfort at rest.  He denies any history of PND, orthopnea, or lower extremity edema.  He has not had tachypalpitations, dizzy spells, nor syncope.  He has no other significant physical limitations, and his symptoms of exertional chest pressure and shortness of breath keep him from doing much of his ordinary daily activities.    Past Medical History:  Diagnosis Date  . Aortic stenosis due to bicuspid aortic valve   . Bronchitis   . Coronary artery disease    Non-ST elevation myocardial infarction cardiac catheterization showed significant three-vessel coronary artery disease. 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 Ejection fraction was 60%.  . Glucose intolerance (impaired glucose tolerance)   . Hyperlipidemia   . Hypertension   . MI (myocardial infarction) (HCC)   . Obesity     Past Surgical History:  Procedure Laterality Date  . CARDIAC CATHETERIZATION  06/2013   [facility]  . CARDIAC CATHETERIZATION N/A 10/29/2015   Procedure: Left Heart Cath and Cors/Grafts Angiography;  Surgeon: Muhammad A Arida, MD;  Location: [facility] INVASIVE CV LAB;  Service: Cardiovascular;  Laterality: N/A;  . CORONARY ARTERY BYPASS GRAFT N/A 07/05/2013   Procedure: CORONARY ARTERY BYPASS GRAFTING (CABG);  Surgeon: Peter Van Trigt, MD;  Location: [facility] OR;  Service: Open Heart Surgery;  Laterality: N/A;  . INTRAOPERATIVE TRANSESOPHAGEAL ECHOCARDIOGRAM N/A 07/05/2013   Procedure: INTRAOPERATIVE TRANSESOPHAGEAL ECHOCARDIOGRAM;  Surgeon: Peter Van Trigt, MD;  Location: [facility] OR;  Service: Open Heart Surgery;  Laterality: N/A;  . NO PAST SURGERIES    . RIGHT/LEFT HEART CATH AND CORONARY ANGIOGRAPHY Bilateral  11/27/2017   Procedure: RIGHT/LEFT HEART CATH AND CORONARY ANGIOGRAPHY;  Surgeon: Arida, Muhammad A, MD;  Location: [facility] INVASIVE CV LAB;  Service: Cardiovascular;  Laterality: Bilateral;    Family History  Problem Relation Age of Onset  . Heart disease Father   . Heart attack Father     Social History   Socioeconomic History  . Marital status: Married    Spouse name: Not on file  . Number of children: Not on file  . Years of education: Not on file  . Highest education level: Not on file  Occupational History  . Not on file  Social Needs  . Financial resource strain: Not on file  . Food insecurity:    Worry: Not on file    Inability: Not on file  . Transportation needs:    Medical: Not on file    Non-medical: Not on file  Tobacco Use  . Smoking status: Never Smoker  . Smokeless tobacco: Never Used  Substance and Sexual Activity  . Alcohol use: No  . Drug use: No  . Sexual activity: Not on file  Lifestyle  . Physical activity:    Days per week: Not on file    Minutes per session: Not on file  . Stress: Not on file  Relationships  . Social connections:    Talks on phone: Not on file    Gets together: Not on file    Attends religious service: Not on file      Active member of club or organization: Not on file    Attends meetings of clubs or organizations: Not on file    Relationship status: Not on file  . Intimate partner violence:    Fear of current or ex partner: Not on file    Emotionally abused: Not on file    Physically abused: Not on file    Forced sexual activity: Not on file  Other Topics Concern  . Not on file  Social History Narrative  . Not on file    Current Outpatient Medications  Medication Sig Dispense Refill  . amLODipine (NORVASC) 5 MG tablet Take 1 tablet (5 mg total) by mouth daily. 90 tablet 3  . aspirin EC 81 MG tablet Take 1 tablet (81 mg total) by mouth daily. 30 tablet 3  . atorvastatin (LIPITOR) 80 MG tablet TAKE 1 TABLET (80 MG TOTAL)  BY MOUTH DAILY AT 6 PM. 90 tablet 3  . metoprolol tartrate (LOPRESSOR) 50 MG tablet Take 1 tablet (50 mg total) by mouth 2 (two) times daily. 180 tablet 3  . potassium chloride (K-DUR) 10 MEQ tablet Take 2 tablets (20 mEq) by mouth the first day, then take 1 tablet (10 mEq) by mouth once a day. 30 tablet 3   No current facility-administered medications for this visit.     No Known Allergies    Review of Systems:   General:  normal appetite, decreased energy, no weight gain, no weight loss, no fever  Cardiac:  + chest pain with exertion, no chest pain at rest, + SOB with exertion, no resting SOB, no PND, no orthopnea, no palpitations, no arrhythmia, no atrial fibrillation, no LE edema, no dizzy spells, no syncope  Respiratory:  + exertional shortness of breath, no home oxygen, no productive cough, no dry cough, no bronchitis, no wheezing, no hemoptysis, no asthma, no pain with inspiration or cough, no sleep apnea, no CPAP at night  GI:   + occasional difficulty swallowing, no reflux, no frequent heartburn, no hiatal hernia, no abdominal pain, no constipation, no diarrhea, no hematochezia, no hematemesis, no melena  GU:   no dysuria,  no frequency, no urinary tract infection, no hematuria, no enlarged prostate, no kidney stones, no kidney disease  Vascular:  no pain suggestive of claudication, no pain in feet, no leg cramps, no varicose veins, no DVT, no non-healing foot ulcer  Neuro:   no stroke, no TIA's, no seizures, no headaches, no temporary blindness one eye,  no slurred speech, no peripheral neuropathy, no chronic pain, no instability of gait, no memory/cognitive dysfunction  Musculoskeletal: no arthritis, no joint swelling, no myalgias, no difficulty walking, normal mobility   Skin:   no rash, no itching, no skin infections, no pressure sores or ulcerations  Psych:   no anxiety, no depression, no nervousness, no unusual recent stress  Eyes:   no blurry vision, no floaters, no recent  vision changes, + wears glasses or contacts  ENT:   no hearing loss, no loose or painful teeth, no dentures, last saw dentist within the past year  Hematologic:  no easy bruising, no abnormal bleeding, no clotting disorder, no frequent epistaxis  Endocrine:  no diabetes, does not check CBG's at [facility]           Physical Exam:   BP 139/83 (BP Location: Right Leg, Patient Position: Sitting, Cuff Size: Large)   Pulse 78   Resp 16   Ht 5' 5" (1.651 m)   Wt 190 lb 12.8   oz (86.5 kg)   SpO2 97% Comment: ON RA  BMI 31.75 kg/m   General:  Mildly obese,  well-appearing  HEENT:  Unremarkable   Neck:   no JVD, no bruits, no adenopathy   Chest:   clear to auscultation, symmetrical breath sounds, no wheezes, no rhonchi   CV:   RRR, grade III/VI crescendo/decrescendo murmur heard best at RSB,  no diastolic murmur  Abdomen:  soft, non-tender, no masses   Extremities:  warm, well-perfused, pulses palpable, no LE edema  Rectal/GU  Deferred  Neuro:   Grossly non-focal and symmetrical throughout  Skin:   Clean and dry, no rashes, no breakdown   Diagnostic Tests:  Transthoracic Echocardiography  Patient:    James Yates, James Yates MR #:       [id number] Study Date: 10/10/2017 Gender:     M Age:        62 Height:     165.1 cm Weight:     89 kg BSA:        2.05 m^2 Pt. Status: Room:   ATTENDING    Default, Provider [id number]  ORDERING     Muhammad Arida, MD  REFERRING    Muhammad Arida, MD  PERFORMING   [facility]  SONOGRAPHER  Gary Joseph, RVT, RDCS, RDMS  cc:  ------------------------------------------------------------------- LV EF: 60% -   65%  ------------------------------------------------------------------- History:   PMH:   Dyspnea.  Angina pectoris.  Coronary artery disease.  Aortic valve disease.  Risk factors:  Lifelong nonsmoker. Hypertension. Dyslipidemia.  ------------------------------------------------------------------- Study Conclusions  - Left  ventricle: The cavity size was normal. Systolic function was   normal. The estimated ejection fraction was in the range of 60%   to 65%. Wall motion was normal; there were no regional wall   motion abnormalities. Left ventricular diastolic function   parameters were normal. - Aortic valve: Transvalvular velocity was increased. There was   severe stenosis. Peak velocity (S): 422 cm/s. Mean gradient (S):   38 mm Hg. Peak gradient (S): 71 mm Hg. - Left atrium: The atrium was normal in size. - Right ventricle: Systolic function was normal. - Pulmonary arteries: Systolic pressure was mildly elevated. PA   peak pressure: 46 mm Hg (S).  Impressions:  - Significant increase in aortic valve gradient compared to prior   study in 2018. Consider TEE or other study/cardiac cath for   verification if clinically indicated.  ------------------------------------------------------------------- Labs, prior tests, procedures, and surgery: Coronary artery bypass grafting.  ------------------------------------------------------------------- Study data:  The previous study was not available, so comparison was made to the report of March 2018.  Study status:  Routine. Procedure:  Transthoracic echocardiography. Image quality was good. Intravenous contrast (Definity) was administered.  Study completion:  There were no complications.          Transthoracic echocardiography.  M-mode, complete 2D, spectral Doppler, and color Doppler.  Birthdate:  Patient birthdate: 04/11/1956.  Age:  Patient is 62 yr old.  Sex:  Gender: male.    BMI: 32.7 kg/m^2.  Blood pressure:     138/86  Patient status:  Outpatient.  Study date: Study date: 10/10/2017. Study time: 11:51 AM.  -------------------------------------------------------------------  ------------------------------------------------------------------- Left ventricle:  The cavity size was normal. Systolic function was normal. The estimated ejection  fraction was in the range of 60% to 65%. Wall motion was normal; there were no regional wall motion abnormalities. The transmitral flow pattern was normal. The deceleration time of the early transmitral flow velocity was normal. The pulmonary vein flow pattern   was normal. The tissue Doppler parameters were normal. Left ventricular diastolic function parameters were normal.  ------------------------------------------------------------------- Aortic valve:   Trileaflet; normal thickness, severely calcified leaflets. Mobility was not restricted.  Doppler:  Transvalvular velocity was increased. There was severe stenosis. There was no regurgitation.    VTI ratio of LVOT to aortic valve: 0.24. Valve area (VTI): 0.6 cm^2. Indexed valve area (VTI): 0.29 cm^2/m^2. Mean velocity ratio of LVOT to aortic valve: 0.26. Valve area (Vmean): 0.66 cm^2. Indexed valve area (Vmean): 0.32 cm^2/m^2.    Mean gradient (S): 38 mm Hg. Peak gradient (S): 71 mm Hg.  ------------------------------------------------------------------- Aorta:  Aortic root: The aortic root was normal in size.  ------------------------------------------------------------------- Mitral valve:   Structurally normal valve.   Mobility was not restricted.  Doppler:  Transvalvular velocity was within the normal range. There was no evidence for stenosis. There was no regurgitation.    Valve area by pressure half-time: 3.19 cm^2. Indexed valve area by pressure half-time: 1.55 cm^2/m^2.    Peak gradient (D): 4 mm Hg.  ------------------------------------------------------------------- Left atrium:  The atrium was normal in size.  ------------------------------------------------------------------- Right ventricle:  The cavity size was normal. Wall thickness was normal. Systolic function was normal.  ------------------------------------------------------------------- Pulmonic valve:    Structurally normal valve.   Cusp separation  was normal.  Doppler:  Transvalvular velocity was within the normal range. There was no evidence for stenosis. There was no regurgitation.  ------------------------------------------------------------------- Tricuspid valve:   Structurally normal valve.    Doppler: Transvalvular velocity was within the normal range. There was mild regurgitation.  ------------------------------------------------------------------- Pulmonary artery:   The main pulmonary artery was normal-sized. Systolic pressure was mildly elevated.  ------------------------------------------------------------------- Right atrium:  The atrium was normal in size.  ------------------------------------------------------------------- Pericardium:  There was no pericardial effusion.  ------------------------------------------------------------------- Systemic veins: Inferior vena cava: The vessel was normal in size.  ------------------------------------------------------------------- Measurements   Left ventricle                           Value          Reference  LV ID, ED, PLAX chordal                  49    mm       43 - 52  LV ID, ES, PLAX chordal                  31    mm       23 - 38  LV fx shortening, PLAX chordal           37    %        >=29  LV PW thickness, ED                      9     mm       ----------  IVS/LV PW ratio, ED                      0.89           <=1.3  Stroke volume, 2D                        49    ml       ----------  Stroke volume/bsa, 2D                      24    ml/m^2   ----------  LV ejection fraction, 1-p A4C            68    %        ----------  LV e&', lateral                           5.77  cm/s     ----------  LV E/e&', lateral                         16.22          ----------  LV e&', medial                            121   cm/s     ----------  LV E/e&', medial                          0.77           ----------  LV e&', average                           63.39 cm/s      ----------  LV E/e&', average                         1.48           ----------    Ventricular septum                       Value          Reference  IVS thickness, ED                        8     mm       ----------    LVOT                                     Value          Reference  LVOT ID, S                               18    mm       ----------  LVOT area                                2.54  cm^2     ----------  LVOT ID                                  18    mm       ----------  LVOT mean velocity, S                    74.8  cm/s     ----------  LVOT VTI, S                                19.4  cm       ----------  Stroke volume (SV), LVOT DP              49.4  ml       ----------  Stroke index (SV/bsa), LVOT DP           24    ml/m^2   ----------    Aortic valve                             Value          Reference  Aortic valve peak velocity, S            422   cm/s     ----------  Aortic valve mean velocity, S            288   cm/s     ----------  Aortic valve VTI, S                      82    cm       ----------  Aortic mean gradient, S                  38    mm Hg    ----------  Aortic peak gradient, S                  71    mm Hg    ----------  VTI ratio, LVOT/AV                       0.24           ----------  Aortic valve area, VTI                   0.6   cm^2     ----------  Aortic valve area/bsa, VTI               0.29  cm^2/m^2 ----------  Velocity ratio, mean, LVOT/AV            0.26           ----------  Aortic valve area, mean velocity         0.66  cm^2     ----------  Aortic valve area/bsa, mean              0.32  cm^2/m^2 ----------  velocity    Aorta                                    Value          Reference  Aortic root ID, ED                       31    mm       ----------  Ascending aorta ID, A-P, S               28    mm       ----------    Left atrium                              Value          Reference  LA ID, A-P, ES                             33    mm        ----------  LA ID/bsa, A-P                           1.61  cm/m^2   <=2.2  LA volume, S                             38.5  ml       ----------  LA volume/bsa, S                         18.7  ml/m^2   ----------  LA volume, ES, 1-p A4C                   35.7  ml       ----------  LA volume/bsa, ES, 1-p A4C               17.4  ml/m^2   ----------  LA volume, ES, 1-p A2C                   40.9  ml       ----------  LA volume/bsa, ES, 1-p A2C               19.9  ml/m^2   ----------    Mitral valve                             Value          Reference  Mitral E-wave peak velocity              93.6  cm/s     ----------  Mitral A-wave peak velocity              78.6  cm/s     ----------  Mitral deceleration time         (H)     236   ms       150 - 230  Mitral pressure half-time                69    ms       ----------  Mitral peak gradient, D                  4     mm Hg    ----------  Mitral E/A ratio, peak                   1.2            ----------  Mitral valve area, PHT, DP               3.19  cm^2     ----------  Mitral valve area/bsa, PHT, DP           1.55  cm^2/m^2 ----------    Pulmonary arteries                       Value          Reference  PA pressure, S, DP               (H)     46    mm Hg    <=30    Tricuspid valve                            Value          Reference  Tricuspid regurg peak velocity           320   cm/s     ----------  Tricuspid peak RV-RA gradient            41    mm Hg    ----------    Right atrium                             Value          Reference  RA ID, S-I, ES, A4C              (H)     49.3  mm       34 - 49  RA area, ES, A4C                         14.5  cm^2     8.3 - 19.5  RA volume, ES, A/L                       33.4  ml       ----------  RA volume/bsa, ES, A/L                   16.3  ml/m^2   ----------    Right ventricle                          Value          Reference  TAPSE                                    15.9  mm       ----------  RV  s&', lateral, S                        8.27  cm/s     ----------    Pulmonic valve                           Value          Reference  Pulmonic valve peak velocity, S          83.4  cm/s     ----------  Legend: (L)  and  (H)  mark values outside specified reference range.  ------------------------------------------------------------------- Prepared and Electronically Authenticated by  Tim Gollan, MD, FACC 2019-05-15T18:41:13   RIGHT/LEFT HEART CATH AND CORONARY ANGIOGRAPHY  Conclusion     Ost LAD to Mid LAD lesion is 80% stenosed.  Dist LAD lesion is 30% stenosed.  Ost 1st Diag to 1st Diag lesion is 70% stenosed.  Ost 2nd Diag to 2nd Diag lesion is 100% stenosed.  Prox RCA lesion is 30% stenosed.  Mid RCA lesion is 100% stenosed.  Prox Graft lesion before Ramus is 100% stenosed.  The graft exhibits no disease.  LIMA.  The graft exhibits no disease.  Prox Cx to Mid Cx lesion is 100% stenosed.  Origin lesion is 100% stenosed.   1.  Significant underlying three-vessel coronary artery disease with patent LIMA to LAD (could not be selectively engaged), patent SVG to RCA and known occluded SVG to OM 3.    Occluded native left circumflex. 2.  Moderate to severe aortic stenosis on current hemodynamics with a mean gradient of 26 mmHg and valve area of 1.2.  However, his aortic stenosis is likely severe based on echocardiogram findings.  Aortic valve area today is overestimated due to high cardiac output.  Valve area by echocardiogram was 0.6.  Valve morphology is also consistent with severe stenosis. 3.  Right heart catheterization showed mildly elevated filling pressures with mild pulmonary hypertension.  Recommendations:  Recommend aortic valve replacement +1 vessel CABG to OM1 (which is the biggest of OM branches).    Indications   Aortic valve stenosis, etiology of cardiac valve disease unspecified [I35.0 (ICD-10-CM)]  Procedural Details/Technique   Technical  Details Procedural details: The right groin was prepped, draped, and anesthetized with 1% lidocaine. Using modified Seldinger technique, a 5 French sheath was introduced into the right femoral artery. A 7 French sheath was placed in the right femoral vein. Right heart catheterization was performed with a 7 French Swan-Ganz catheter. Standard Judkins catheters were used for coronary angiography and LV pressure. The JR4 was used to engage the SVG to OM. It was also used to perform LIMA angiography. The LIMA could not be selectively engaged due to significant tortuosity. A multipurpose catheter was used to engage the SVG to RCA. Catheter exchanges were performed over a guidewire. There were no immediate procedural complications. The patient was transferred to the post catheterization recovery area for further monitoring. A Mynx closure device was used for femoral artery hemostasis.     Estimated blood loss <50 mL.  During this procedure the patient was administered the following to achieve and maintain moderate conscious sedation: Versed 1 mg, Fentanyl 25 mcg, while the patient's heart rate, blood pressure, and oxygen saturation were continuously monitored. The period of conscious sedation was 34 minutes, of which I was present face-to-face 100% of this time.  Coronary Findings   Diagnostic  Dominance: Right  Left Main  Vessel is angiographically normal.  Left Anterior Descending  Ost LAD to Mid LAD lesion 80% stenosed  Ost LAD to Mid LAD lesion is 80% stenosed.  Dist LAD lesion 30% stenosed  Dist LAD lesion is 30% stenosed.  First Diagonal Branch  Ost 1st Diag to 1st Diag lesion 70% stenosed  Ost 1st Diag to 1st Diag lesion is 70% stenosed.  Second Diagonal Branch  Ost 2nd Diag to 2nd Diag lesion 100% stenosed  Ost 2nd Diag to 2nd Diag lesion is 100% stenosed.  Left Circumflex  Prox Cx to Mid Cx lesion 100% stenosed  Prox Cx to Mid Cx lesion is 100% stenosed.  Third Obtuse Marginal Branch    Collaterals  3rd Mrg filled by collaterals from 1st RPLB.    Right Coronary Artery  Prox RCA lesion 30% stenosed  Prox RCA lesion is 30% stenosed.  Mid RCA lesion 100% stenosed  Mid RCA lesion is 100% stenosed.  Sequential single graft Graft to Ramus, 2nd Mrg  Prox Graft lesion before Ramus 100% stenosed  Prox Graft lesion before Ramus is 100% stenosed.  single graft Graft to RPDA  The graft exhibits no disease.  LIMA LIMA Graft to Dist LAD  LIMA. The graft exhibits no disease.  Graft to Ost 3rd Mrg  Origin lesion 100% stenosed  Origin lesion is 100% stenosed.  Intervention   No interventions have been documented.  Coronary Diagrams   Diagnostic Diagram       Implants    Vascular Products  Device Closure Mynxgrip   5f - [id number] - Implanted    Inventory item: DEVICE CLOSURE MYNXGRIP 5F Model/Cat number: [id number]  Manufacturer: [employer] Lot number: [id number]  Device identifier: [id number] Device identifier type: GS1  Area Of Implantation: Groin    GUDID Information   Request status Successful    Brand name: MYNXGRIP Version/Model: MX5021  Company name: [employer] MRI safety info as of 11/27/17: Labeling does not contain MRI Safety Information  Contains dry or latex rubber: No    GMDN P.T. name: Wound hydrogel dressing, sterile    As of 11/27/2017   Status: Implanted      MERGE Images   Show images for CARDIAC CATHETERIZATION   Link to Procedure Log   Procedure Log    Hemo Data 11/27/17 1123--11/27/17 1229 before discharge   AO Systolic Cath Pressure AO Diastolic Cath Pressure AO Mean Cath Pressure LV Systolic Cath Pressure LV End Diastolic PA Systolic Cath Pressure PA Diastolic Cath Pressure PA Mean Cath Pressure RV Systolic Cath Pressure RV Diastolic Cath Pressure RV End Diastolic AO O2 Sat PA O2 Sat AO O2 Sat  - - - - - - - - 42 mmHg 8 mmHg 15 mmHg - - -  - - - - - 40 mmHg 17 mmHg 26 mmHg - - - - - -  131 74 mmHg 96 mmHg - - - - - - - - - - -   - - - 162 mmHg 20 mmHg - - - - - - - - -  - - - 160 mmHg 18 mmHg - - - - - - - - -  - - - 159 mmHg 18 mmHg - - - - - - - - -  136 81 mmHg 105 mmHg - - - - - - - - - - -  136 74 mmHg 95 mmHg - - - - - - - - - - -  - - - - - - - - - - - 97.2 % - SA  - - - - - - - - - - - - MV -  130 80 mmHg 100 mmHg - - - -            Cardiac TAVR CT  TECHNIQUE: The patient was scanned on a Phillips Force scanner. A 120 kV retrospective scan was triggered in the descending thoracic aorta at 111 HU's. Gantry rotation speed was 250 msecs and collimation was .6 mm. No beta blockade or nitro were given. The 3D data set was reconstructed in 5% intervals of the R-R cycle. Systolic and diastolic phases were analyzed on a dedicated work station using MPR, MIP and VRT modes. The patient received 80 cc of contrast.  FINDINGS: Aortic Valve: Trileaflet aortic valve with partially co-joined left and right leaflets. Leaflets are moderately thickened and calcified leaflets with moderately restricted leaflet excursions and no calcifications are extending into the LVOT.  Aorta: Normal size with mild diffuse calcifications and atheroma and no dissection.  Sinotubular Junction: 27 x 24 mm  Ascending Thoracic Aorta: 32 x 31 mm  Aortic Arch: 26 x 24 mm  Descending Thoracic Aorta: 24 x 23 mm  Sinus of Valsalva Measurements:  Non-coronary: 30 mm  Right -coronary: 33 mm  Left -coronary: 33 mm  Coronary Artery Height above Annulus:  Left Main: 16 mm  Right Coronary: 18 mm  Virtual Basal Annulus Measurements:  Maximum/Minimum Diameter: 26.7 x 20.9 mm  Mean Diameter: 23.2 mm  Perimeter: 74.3 mm  Area: 422 mm2    Optimum Fluoroscopic Angle for Delivery: RAO 5 CAU 3  IMPRESSION: 1. Trileaflet aortic valve with partially co-joined left and right leaflets. Leaflets are moderately thickened and calcified leaflets with moderately restricted leaflet excursions and no  calcifications are extending into the LVOT. Annular measurements suitable for delivery of a 23 mm Edwards-SAPIEN 3 valve.  2. Sufficient coronary to annulus distance.  3. Optimum Fluoroscopic Angle for Delivery:  RAO 5 CAU 3  4. No thrombus in the left atrial appendage.  Electronically Signed: By: Katarina  Nelson On: 12/14/2017 15:42   CT ANGIOGRAPHY CHEST, ABDOMEN AND PELVIS  TECHNIQUE: Multidetector CT imaging through the chest, abdomen and pelvis was performed using the standard protocol during bolus administration of intravenous contrast. Multiplanar reconstructed images and MIPs were obtained and reviewed to evaluate the vascular anatomy.  CONTRAST:  100mL ISOVUE-370 IOPAMIDOL (ISOVUE-370) INJECTION 76%  COMPARISON:  None.  FINDINGS: CTA CHEST FINDINGS  Cardiovascular: Heart size is mildly enlarged. There is no significant pericardial fluid, thickening or pericardial calcification. There is aortic atherosclerosis, as well as atherosclerosis of the great vessels of the mediastinum and the coronary arteries, including calcified atherosclerotic plaque in the left anterior descending, left circumflex and right coronary arteries. Status post median sternotomy for CABG including LIMA to the LAD. Severe thickening calcification of the aortic valve.  Mediastinum/Lymph Nodes: No pathologically enlarged mediastinal or hilar lymph nodes. Esophagus is unremarkable in appearance. No axillary lymphadenopathy.  Lungs/Pleura: No suspicious appearing pulmonary nodules or masses are noted. No acute consolidative airspace disease. No pleural effusions. Areas of scarring and atelectasis are noted in the right middle lobe and left upper lobe.  Musculoskeletal/Soft Tissues: Median sternotomy wires. There are no aggressive appearing lytic or blastic lesions noted in the visualized portions of the skeleton.  CTA ABDOMEN AND PELVIS FINDINGS  Hepatobiliary: No  suspicious cystic or solid hepatic lesions. No intra or extrahepatic biliary ductal dilatation. Gallbladder is normal in appearance.  Pancreas: No pancreatic mass. No pancreatic ductal dilatation. No pancreatic or peripancreatic fluid or inflammatory changes.  Spleen: Unremarkable.  Adrenals/Urinary Tract: Bilateral kidneys and bilateral adrenal glands are normal in appearance. No hydroureteronephrosis. Urinary bladder is normal in appearance.  Stomach/Bowel: Normal appearance of the stomach. No pathologic dilatation of the small bowel or colon. Normal appendix.  Vascular/Lymphatic: Aortic atherosclerosis with vascular findings and measurements pertinent to potential TAVR procedure, as detailed below. Celiac axis, superior mesenteric artery and inferior mesenteric artery are all widely patent without hemodynamically significant stenosis. Single right and 2 left renal arteries are all widely patent without hemodynamically significant stenosis. There are no aggressive appearing lytic or blastic lesions noted in the visualized portions of the skeleton.  Reproductive: Prostate gland and seminal vesicles are unremarkable in appearance.  Other: No significant volume of ascites.  No pneumoperitoneum.  Musculoskeletal: There are no aggressive appearing lytic or blastic lesions noted in the visualized portions of the skeleton.  VASCULAR MEASUREMENTS PERTINENT TO TAVR:  AORTA:  Minimal Aortic Diameter-15 x 15 mm  Severity of Aortic Calcification-minimal  RIGHT PELVIS:  Right Common Iliac Artery -  Minimal Diameter-9.1 x 8.0 mm  Tortuosity-mild  Calcification-moderate  Right External Iliac Artery -  Minimal Diameter-7.1 x 6.7 mm  Tortuosity-moderate  Calcification-none  Right Common Femoral Artery -  Minimal Diameter-5.4 x 4.3 mm  Tortuosity-mild  Calcification-mild  LEFT PELVIS:  Left Common Iliac Artery -  Minimal Diameter-7.5  x 6.7 mm  Tortuosity-mild  Calcification-mild  Left External Iliac Artery -  Minimal Diameter-7.8 x 7.6 mm  Tortuosity-mild    Calcification-none  Left Common Femoral Artery -  Minimal Diameter-6.7 x 5.7 mm  Tortuosity-mild  Calcification-mild  Review of the MIP images confirms the above findings.  IMPRESSION: 1. Vascular findings and measurements pertinent to potential TAVR procedure, as detailed above. 2. Severe thickening calcification of the aortic valve, compatible with the reported clinical history of severe aortic stenosis. 3. Aortic atherosclerosis, in addition to 3 vessel coronary artery disease. Status post median sternotomy for CABG including LIMA to the LAD. 4. Additional incidental findings, as above.   Electronically Signed   By: Daniel  Entrikin M.D.   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  He describes progressive symptoms of exertional shortness of breath and chest pressure consistent with exertional angina and chronic diastolic congestive heart failure, New York Heart Association functional class IIb.  I have personally reviewed the patient's most recent transthoracic echocardiogram, diagnostic cardiac catheterization, and CT angiograms.  The echocardiogram performed in May 2019 is relatively poor quality but demonstrates the presence of a Sievers type I bicuspid aortic valve.  Peak velocity across the aortic valve ranged between 3.2 and 4.2 m/s corresponding to mean transvalvular gradient ranging between 26 and 38 mmHg, respectively.  Left ventricular systolic function remains normal.  Sound transmission was somewhat poor.  Diagnostic cardiac catheterization confirmed the presence of aortic stenosis and reveals severe left main and three-vessel coronary artery disease  with significant vein graft disease.  There remains continued patency of left internal mammary artery graft to the distal left anterior descending coronary artery and saphenous vein graft to the posterior descending coronary artery.  Native left circumflex coronary artery is chronically occluded and the vein graft to the left circumflex system is chronically occluded.  There is a large obtuse marginal branch of the left circumflex which fills via left to left collaterals.  I agree the patient would likely benefit from aortic valve replacement with or without redo coronary artery bypass grafting.  Risks associated with conventional surgery would be relatively low but somewhat elevated because of the patient's previous bypass surgery.  Conventional surgery would offer the potential for redo coronary artery bypass grafting to the large obtuse marginal branch of the left circumflex system, but this vessel might be difficult to graft because of its location behind the patent left internal mammary artery graft and the patient's significant left ventricular hypertrophy.  Cardiac-gated CTA of the heart reveals anatomical characteristics consistent with aortic stenosis suitable for treatment by transcatheter aortic valve replacement without any significant complicating features and CTA of the aorta and iliac vessels demonstrate what appears to be adequate pelvic vascular access to facilitate a transfemoral approach.  On careful review of the patient's cardiac gated CT angiogram I suspect that the reported measurements might be somewhat low and the patient might have an aortic annulus large enough to accept a 26 mm Edwards Sapien 3 transcatheter heart valve.    Plan:  The patient and his son were counseled at length with an interpreter regarding treatment alternatives for management of severe symptomatic aortic stenosis and coronary artery disease. Alternative approaches such as conventional aortic valve replacement  with or without redo coronary artery bypass grafting, transcatheter aortic valve replacement, and continued medical therapy without intervention were compared and contrasted at length.  The risks associated with conventional surgery were discussed in detail, as were expectations for post-operative convalescence.    Issues specific to transcatheter aortic valve replacement were discussed including questions about long term valve durability, the potential for paravalvular leak, possible increased risk of need for permanent pacemaker placement, and other technical complications related to the procedure itself.  Long-term prognosis with medical therapy was discussed. This discussion was placed in the context of the patient's own specific clinical presentation and past medical history.  All of their questions have been addressed.  The patient desires to proceed with transcatheter aortic valve replacement in the near future.  We will proceed with trans-thoracic echocardiogram as soon as practical because of the relatively poor sound transmission and quality of the echocardiogram performed last May.  We tentatively plan to proceed with transcatheter aortic valve replacement on March 13, 2018.  Following the decision to proceed with transcatheter aortic valve replacement, a discussion has been held regarding what types of management strategies would be attempted intraoperatively in the event of life-threatening complications, including whether or not the patient would be considered a candidate for the use of cardiopulmonary bypass and/or conversion to open sternotomy for attempted surgical intervention.  The patient has been advised of a variety of complications that might develop including but not limited to risks of death, stroke, paravalvular leak, aortic dissection or other major vascular complications, aortic annulus rupture, device embolization, cardiac rupture or perforation, mitral regurgitation, acute  myocardial infarction, arrhythmia, heart block or bradycardia requiring permanent pacemaker placement, congestive heart failure, respiratory failure, renal failure, pneumonia, infection, other late complications related to structural valve deterioration or migration, or other complications that might ultimately cause a temporary or permanent loss of functional independence or other long term morbidity.  The patient provides full informed consent for the procedure as described and all questions were answered.   I spent in excess of 90 minutes during the conduct of this office consultation and >50% of this time involved direct face-to-face encounter with the patient for counseling and/or coordination of their care.   Clarence H. Owen, MD 02/12/2018 9:40 AM   

## 2018-02-13 ENCOUNTER — Encounter: Payer: Self-pay | Admitting: Physician Assistant

## 2018-02-13 ENCOUNTER — Other Ambulatory Visit: Payer: Self-pay | Admitting: Physician Assistant

## 2018-02-13 DIAGNOSIS — I350 Nonrheumatic aortic (valve) stenosis: Secondary | ICD-10-CM

## 2018-02-21 ENCOUNTER — Other Ambulatory Visit: Payer: Self-pay

## 2018-02-21 DIAGNOSIS — I350 Nonrheumatic aortic (valve) stenosis: Secondary | ICD-10-CM

## 2018-02-27 ENCOUNTER — Ambulatory Visit (HOSPITAL_COMMUNITY)
Admission: RE | Admit: 2018-02-27 | Discharge: 2018-02-27 | Disposition: A | Discharge status: Home / Self Care | Payer: BLUE CROSS/BLUE SHIELD | Source: Ambulatory Visit | Attending: Physician Assistant | Admitting: Physician Assistant

## 2018-02-27 ENCOUNTER — Ambulatory Visit (HOSPITAL_BASED_OUTPATIENT_CLINIC_OR_DEPARTMENT_OTHER)
Admission: RE | Admit: 2018-02-27 | Discharge: 2018-02-27 | Disposition: A | Discharge status: Home / Self Care | Payer: BLUE CROSS/BLUE SHIELD | Source: Ambulatory Visit | Attending: Physician Assistant | Admitting: Physician Assistant

## 2018-02-27 ENCOUNTER — Encounter: Payer: Self-pay | Admitting: Physical Therapy

## 2018-02-27 ENCOUNTER — Ambulatory Visit: Payer: BLUE CROSS/BLUE SHIELD | Attending: Physician Assistant | Admitting: Physical Therapy

## 2018-02-27 DIAGNOSIS — R262 Difficulty in walking, not elsewhere classified: Secondary | ICD-10-CM

## 2018-02-27 DIAGNOSIS — I082 Rheumatic disorders of both aortic and tricuspid valves: Secondary | ICD-10-CM | POA: Insufficient documentation

## 2018-02-27 DIAGNOSIS — I6523 Occlusion and stenosis of bilateral carotid arteries: Secondary | ICD-10-CM | POA: Insufficient documentation

## 2018-02-27 DIAGNOSIS — I350 Nonrheumatic aortic (valve) stenosis: Secondary | ICD-10-CM | POA: Diagnosis present

## 2018-02-27 LAB — PULMONARY FUNCTION TEST
DL/VA % PRED: 128 %
DL/VA: 5.61 ml/min/mmHg/L
DLCO UNC % PRED: 96 %
DLCO UNC: 26.1 ml/min/mmHg
FEF 25-75 Post: 2.62 L/sec
FEF 25-75 Pre: 2.67 L/sec
FEF2575-%CHANGE-POST: -1 %
FEF2575-%Pred-Post: 103 %
FEF2575-%Pred-Pre: 105 %
FEV1-%Change-Post: 1 %
FEV1-%PRED-POST: 84 %
FEV1-%Pred-Pre: 82 %
FEV1-PRE: 2.53 L
FEV1-Post: 2.58 L
FEV1FVC-%CHANGE-POST: 8 %
FEV1FVC-%Pred-Pre: 103 %
FEV6-%Change-Post: -4 %
FEV6-%PRED-POST: 78 %
FEV6-%Pred-Pre: 81 %
FEV6-POST: 3 L
FEV6-PRE: 3.13 L
FEV6FVC-%Change-Post: 0 %
FEV6FVC-%PRED-POST: 105 %
FEV6FVC-%PRED-PRE: 105 %
FVC-%Change-Post: -6 %
FVC-%Pred-Post: 75 %
FVC-%Pred-Pre: 80 %
FVC-PRE: 3.25 L
FVC-Post: 3.05 L
PRE FEV6/FVC RATIO: 100 %
Post FEV1/FVC ratio: 85 %
Post FEV6/FVC ratio: 100 %
Pre FEV1/FVC ratio: 78 %

## 2018-02-27 MED ORDER — ALBUTEROL SULFATE (2.5 MG/3ML) 0.083% IN NEBU
2.5000 mg | INHALATION_SOLUTION | Freq: Once | RESPIRATORY_TRACT | Status: AC
  Administered 2018-02-27: 2.5 mg via RESPIRATORY_TRACT

## 2018-02-27 NOTE — Therapy (Signed)
[facility] [address] [place], [place], [address] Phone: [phone]   Fax:  [phone]  Physical Therapy Evaluation  Patient Details  Name: James Yates MRN: [id number] Date of Birth: [birth year] Referring Provider (PT): Cline Crock PA    Encounter Date: 02/27/2018  PT End of Session - 02/27/18 1326    Visit Number  1    Number of Visits  1    Date for PT Re-Evaluation  02/27/18    Authorization Type  [employer]     PT Start Time  1145    PT Stop Time  1215    PT Time Calculation (min)  30 min    Activity Tolerance  Patient tolerated treatment well    Behavior During Therapy  [facility] for tasks assessed/performed       Past Medical History:  Diagnosis Date  . Aortic stenosis due to bicuspid aortic valve   . Bronchitis   . Coronary artery disease    Non-ST elevation myocardial infarction cardiac catheterization showed significant three-vessel coronary artery disease. 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 Ejection fraction was 60%.  . Glucose intolerance (impaired glucose tolerance)   . Hyperlipidemia   . Hypertension   . MI (myocardial infarction) (HCC)   . Obesity     Past Surgical History:  Procedure Laterality Date  . CARDIAC CATHETERIZATION  06/2013   [facility]  . CARDIAC CATHETERIZATION N/A 10/29/2015   Procedure: Left Heart Cath and Cors/Grafts Angiography;  Surgeon: Iran Ouch, MD;  Location: [facility] INVASIVE CV LAB;  Service: Cardiovascular;  Laterality: N/A;  . CORONARY ARTERY BYPASS GRAFT N/A 07/05/2013   Procedure: CORONARY ARTERY BYPASS GRAFTING (CABG);  Surgeon: [name], MD;  Location: [facility] OR;  Service: Open Heart Surgery;  Laterality: N/A;  . INTRAOPERATIVE TRANSESOPHAGEAL ECHOCARDIOGRAM N/A 07/05/2013   Procedure: INTRAOPERATIVE TRANSESOPHAGEAL ECHOCARDIOGRAM;  Surgeon: [name], MD;  Location: [facility] OR;  Service: Open Heart  Surgery;  Laterality: N/A;  . NO PAST SURGERIES    . RIGHT/LEFT HEART CATH AND CORONARY ANGIOGRAPHY Bilateral 11/27/2017   Procedure: RIGHT/LEFT HEART CATH AND CORONARY ANGIOGRAPHY;  Surgeon: Iran Ouch, MD;  Location: [facility] INVASIVE CV LAB;  Service: Cardiovascular;  Laterality: Bilateral;    There were no vitals filed for this visit.   Subjective Assessment - 02/27/18 1149    Subjective  The patient reports for about 2 years he has been having pressure in his chest when he is walking. He needs to sit down and rest. Sometimes he can walk better then other times.     Limitations  Standing    Currently in Pain?  No/denies   Patient has back pain at times         [facility] PT Assessment - 02/27/18 0001      Assessment   Medical Diagnosis  Severe Aortic Stenosis     Referring Provider (PT)  Cline Crock PA     Onset Date/Surgical Date  --   2 years prior   Next MD Visit  today       Precautions   Precautions  None      Restrictions   Weight Bearing Restrictions  No      Balance Screen   Has the patient fallen in the past 6 months  No    Has the patient had a decrease in activity level because of a fear  of falling?   No    Is the patient reluctant to leave their home because of a fear of falling?   No      Home Environment   Additional Comments  3 steps into the house. They do not give him difficulty       Prior Function   Level of Independence  Independent      Cognition   Overall Cognitive Status  Within Functional Limits for tasks assessed    Attention  Focused    Focused Attention  Appears intact    Memory  Appears intact    Awareness  Appears intact    Problem Solving  Appears intact      Sensation   Light Touch  Appears Intact    Additional Comments  denies parathesias       Coordination   Gross Motor Movements are Fluid and Coordinated  Yes    Fine Motor Movements are Fluid and Coordinated  Yes      ROM / Strength   AROM / PROM / Strength   AROM;PROM;Strength      AROM   Overall AROM Comments  AROM WNL       Strength   Overall Strength Comments  gross strength 5/5     Strength Assessment Site  Hand    Right/Left hand  Right;Left    Right Hand Grip (lbs)  40    Left Hand Grip (lbs)  60       [facility] Pre-Surgical Assessment - 02/27/18 0001    5 Meter Walk Test- trial 1  5 sec    5 Meter Walk Test- trial 2  5 sec.     5 Meter Walk Test- trial 3  4 sec.    5 meter walk test average  4.67 sec    4 Stage Balance Test Position  4    comment  good balance     Comment  21    ADL/IADL Independent with:  Bathing;Dressing    ADL/IADL Needs Assistance with:  Meal prep;Yard work    6 Minute Walk- Baseline  yes    BP (mmHg)  (!) 141/92    HR (bpm)  83    02 Sat (%RA)  98 %    Modified Borg Scale for Dyspnea  0- Nothing at all    Perceived Rate of Exertion (Borg)  6-    6 Minute Walk Post Test  yes    BP (mmHg)  (!) 144/94    HR (bpm)  100    02 Sat (%RA)  86 %    Modified Borg Scale for Dyspnea  3- Moderate shortness of breath or breathing difficulty    Perceived Rate of Exertion (Borg)  10-    Aerobic Endurance Distance Walked  1110              Objective measurements completed on examination: See above findings.                           Plan - 02/27/18 1327    Clinical Impression Statement  See below     Clinical Presentation  Stable    Clinical Decision Making  Low    Rehab Potential  Good    PT Frequency  One time visit    Consulted and Agree with Plan of Care  Patient      Clinical Impression Statement: Pt is a 62 yo male presenting to OP  PT for evaluation prior to possible TAVR surgery due to severe aortic stenosis. Pt reports onset of chest pressure with ambulation  approximately 24 months ago. Symptoms are limiting patients ability to ambulate and perform ADL's. Pt presents with normal ROM and strength, balance and is normal, normal walking speed and fair aerobic endurance per 6  minute walk test. Pt ambulated 555 feet in 2:35 before requesting a seated rest beak lasting 1:25. At time of rest, patient's HR was 100 bpm and O2 was 96 on room air. Pt reported 3/10 shortness of breath on modified scale for dyspnea. Pt able to resume after rest and ambulate an additional 555 feet. Pt ambulated a total of1110 feet in 6 minute walk. B/P increased significantly with 6 minute walk test. Based on the Short Physical Performance Battery, patient has a frailty rating of 9/12 with </= 5/12 considered frail.   Patient will benefit from skilled therapeutic intervention in order to improve the following deficits and impairments:     Visit Diagnosis: Difficulty in walking, not elsewhere classified     Problem List Patient Active Problem List   Diagnosis Date Noted  . Effort angina (HCC)   . Night sweats 09/29/2015  . Abdominal pain 09/29/2015  . Trouble swallowing 09/29/2015  . Sepsis (HCC) 07/19/2015  . Fatigue 07/06/2015  . Aortic stenosis 06/23/2014  . Leg edema, right 09/02/2013  . Coronary artery disease   . Hyperlipidemia   . Hypertension   . 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 NSTEMI (non-ST elevated myocardial infarction) (HCC) 07/02/2013    Dessie Coma  PT DPT  02/27/2018, 1:46 PM  [facility] [address] [place], [place], [address] Phone: [phone]   Fax:  [phone]  Name: James Yates MRN: [id number] Date of Birth: [birth year]

## 2018-02-27 NOTE — Progress Notes (Signed)
Carotid artery duplex has been completed. 1-39% ICA stenosis bilaterally.  02/27/18 2:33 PM Olen Cordial RVT

## 2018-02-27 NOTE — Progress Notes (Signed)
  Echocardiogram 2D Echocardiogram has been performed.  James Yates 02/27/2018, 5:26 PM

## 2018-03-05 ENCOUNTER — Encounter: Payer: Self-pay | Admitting: Physician Assistant

## 2018-03-08 NOTE — Pre-Procedure Instructions (Signed)
[place] Souter  03/08/2018      [id number] [place], [place] - [address] [place] [address] Phone: [phone] Fax: [phone]    Your procedure is scheduled on 03/13/18.  Report to [facility] at 8 A.M.  Call this number if you have problems the morning of surgery:  [phone]   Remember:  Do not eat or drink after midnight.      Take these medicines the morning of surgery with A SIP OF WATER----none    Do not wear jewelry, make-up or nail polish.  Do not wear lotions, powders, or perfumes, or deodorant.  Do not shave 48 hours prior to surgery.  Men may shave face and neck.  Do not bring valuables to the hospital.  [facility] is not responsible for any belongings or valuables.  Contacts, dentures or bridgework may not be worn into surgery.  Leave your suitcase in the car.  After surgery it may be brought to your room.  For patients admitted to the hospital, discharge time will be determined by your treatment team.  Patients discharged the day of surgery will not be allowed to drive home.   Name and phone number of your driver:   [facility] - Preparing for Surgery  Before surgery, you can play an important role.  Because skin is not sterile, your skin needs to be as free of germs as possible.  You can reduce the number of germs on you skin by washing with CHG (chlorahexidine gluconate) soap before surgery.  CHG is an antiseptic cleaner which kills germs and bonds with the skin to continue killing germs even after washing.  Oral Hygiene is also important in reducing the risk of infection.  Remember to brush your teeth with your regular toothpaste the morning of surgery.  Please DO NOT use if you have an allergy to CHG or antibacterial soaps.  If your skin becomes reddened/irritated stop using the CHG and inform your nurse when you arrive at Short Stay.  Do not shave (including legs and underarms) for at least 48  hours prior to the first CHG shower.  You may shave your face.  Please follow these instructions carefully:   1.  Shower with CHG Soap the night before surgery and the morning of Surgery.  2.  If you choose to wash your hair, wash your hair first as usual with your normal shampoo.  3.  After you shampoo, rinse your hair and body thoroughly to remove the shampoo. 4.  Use CHG as you would any other liquid soap.  You can apply chg directly to the skin and wash gently with a      scrungie or washcloth.           5.  Apply the CHG Soap to your body ONLY FROM THE NECK DOWN.   Do not use on open wounds or open sores. Avoid contact with your eyes, ears, mouth and genitals (private parts).  Wash genitals (private parts) with your normal soap.  6.  Wash thoroughly, paying special attention to the area where your surgery will be performed.  7.  Thoroughly rinse your body with warm water from the neck down.  8.  DO NOT shower/wash with your normal soap after using and rinsing off the CHG Soap.  9.  Pat yourself dry with a clean towel.            10.  Wear clean  pajamas.            11.  Place clean sheets on your bed the night of your first shower and do not sleep with pets.  Day of Surgery  Do not apply any lotions/deoderants the morning of surgery.   Please wear clean clothes to the hospital/surgery center. Remember to brush your teeth with toothpaste.   Special instructions:  Take meds as directed by doctor  Please read over the following fact sheets that you were given. MRSA Information

## 2018-03-09 ENCOUNTER — Encounter (HOSPITAL_COMMUNITY)
Admission: RE | Admit: 2018-03-09 | Discharge: 2018-03-09 | Disposition: A | Discharge status: Home / Self Care | Payer: BLUE CROSS/BLUE SHIELD | Source: Ambulatory Visit | Attending: Cardiovascular Disease | Admitting: Cardiovascular Disease

## 2018-03-09 ENCOUNTER — Encounter (HOSPITAL_COMMUNITY): Payer: Self-pay

## 2018-03-09 ENCOUNTER — Ambulatory Visit (HOSPITAL_COMMUNITY)
Admission: RE | Admit: 2018-03-09 | Discharge: 2018-03-09 | Disposition: A | Discharge status: Home / Self Care | Payer: BLUE CROSS/BLUE SHIELD | Source: Ambulatory Visit | Attending: Cardiovascular Disease | Admitting: Cardiovascular Disease

## 2018-03-09 DIAGNOSIS — Z951 Presence of aortocoronary bypass graft: Secondary | ICD-10-CM | POA: Diagnosis not present

## 2018-03-09 DIAGNOSIS — I350 Nonrheumatic aortic (valve) stenosis: Secondary | ICD-10-CM

## 2018-03-09 LAB — BRAIN NATRIURETIC PEPTIDE: B Natriuretic Peptide: 56.2 pg/mL (ref 0.0–100.0)

## 2018-03-09 LAB — BLOOD GAS, ARTERIAL
Acid-Base Excess: 3.4 mmol/L — ABNORMAL HIGH (ref 0.0–2.0)
Bicarbonate: 27.4 mmol/L (ref 20.0–28.0)
DRAWN BY: 470591
FIO2: 21
O2 SAT: 92.4 %
PATIENT TEMPERATURE: 98.6
pCO2 arterial: 42 mmHg (ref 32.0–48.0)
pH, Arterial: 7.431 (ref 7.350–7.450)
pO2, Arterial: 63.1 mmHg — ABNORMAL LOW (ref 83.0–108.0)

## 2018-03-09 LAB — URINALYSIS, ROUTINE W REFLEX MICROSCOPIC
Bilirubin Urine: NEGATIVE
Glucose, UA: NEGATIVE mg/dL
HGB URINE DIPSTICK: NEGATIVE
KETONES UR: NEGATIVE mg/dL
LEUKOCYTES UA: NEGATIVE
Nitrite: NEGATIVE
PROTEIN: NEGATIVE mg/dL
Specific Gravity, Urine: 1.011 (ref 1.005–1.030)
pH: 7 (ref 5.0–8.0)

## 2018-03-09 LAB — COMPREHENSIVE METABOLIC PANEL
ALBUMIN: 4.3 g/dL (ref 3.5–5.0)
ALT: 22 U/L (ref 0–44)
AST: 19 U/L (ref 15–41)
Alkaline Phosphatase: 90 U/L (ref 38–126)
Anion gap: 9 (ref 5–15)
BILIRUBIN TOTAL: 1.1 mg/dL (ref 0.3–1.2)
BUN: 8 mg/dL (ref 8–23)
CALCIUM: 8.8 mg/dL — AB (ref 8.9–10.3)
CO2: 26 mmol/L (ref 22–32)
Chloride: 103 mmol/L (ref 98–111)
Creatinine, Ser: 0.82 mg/dL (ref 0.61–1.24)
GFR calc Af Amer: >60 mL/min (ref >60)
GFR calc non Af Amer: >60 mL/min (ref >60)
GLUCOSE: 113 mg/dL — AB (ref 70–99)
Potassium: 3.5 mmol/L (ref 3.5–5.1)
Sodium: 138 mmol/L (ref 135–145)
TOTAL PROTEIN: 7.5 g/dL (ref 6.5–8.1)

## 2018-03-09 LAB — TYPE AND SCREEN
ABO/RH(D): B POS
Antibody Screen: NEGATIVE

## 2018-03-09 LAB — CBC
HEMATOCRIT: 50 % (ref 39.0–52.0)
Hemoglobin: 16.6 g/dL (ref 13.0–17.0)
MCH: 29.1 pg (ref 26.0–34.0)
MCHC: 33.2 g/dL (ref 30.0–36.0)
MCV: 87.6 fL (ref 80.0–100.0)
PLATELETS: 189 10*3/uL (ref 150–400)
RBC: 5.71 MIL/uL (ref 4.22–5.81)
RDW: 12.6 % (ref 11.5–15.5)
WBC: 6.3 10*3/uL (ref 4.0–10.5)
nRBC: 0 % (ref 0.0–0.2)

## 2018-03-09 LAB — SURGICAL PCR SCREEN
MRSA, PCR: NEGATIVE
Staphylococcus aureus: NEGATIVE

## 2018-03-09 LAB — PROTIME-INR
INR: 1.02
Prothrombin Time: 13.3 seconds (ref 11.4–15.2)

## 2018-03-09 LAB — APTT: aPTT: 30 seconds (ref 24–36)

## 2018-03-09 LAB — HEMOGLOBIN A1C
Hgb A1c MFr Bld: 6.3 % — ABNORMAL HIGH (ref 4.8–5.6)
Mean Plasma Glucose: 134.11 mg/dL

## 2018-03-12 MED ORDER — NITROGLYCERIN IN D5W 200-5 MCG/ML-% IV SOLN
2.0000 ug/min | INTRAVENOUS | Status: DC
  Filled 2018-03-12: qty 250

## 2018-03-12 MED ORDER — EPINEPHRINE PF 1 MG/ML IJ SOLN
0.0000 ug/min | INTRAVENOUS | Status: DC
  Filled 2018-03-12: qty 4

## 2018-03-12 MED ORDER — DEXMEDETOMIDINE HCL IN NACL 400 MCG/100ML IV SOLN
0.1000 ug/kg/h | INTRAVENOUS | Status: AC
  Administered 2018-03-13: 1 ug/kg/h via INTRAVENOUS
  Filled 2018-03-12: qty 100

## 2018-03-12 MED ORDER — VANCOMYCIN HCL 10 G IV SOLR
1500.0000 mg | INTRAVENOUS | Status: DC
  Administered 2018-03-13: 1500 mg via INTRAVENOUS
  Filled 2018-03-12: qty 1500

## 2018-03-12 MED ORDER — SODIUM CHLORIDE 0.9 % IV SOLN
1.5000 g | INTRAVENOUS | Status: AC
  Administered 2018-03-13: 1.5 g via INTRAVENOUS
  Filled 2018-03-12: qty 1.5

## 2018-03-12 MED ORDER — PHENYLEPHRINE HCL-NACL 20-0.9 MG/250ML-% IV SOLN
30.0000 ug/min | INTRAVENOUS | Status: DC
  Filled 2018-03-12: qty 250

## 2018-03-12 MED ORDER — MAGNESIUM SULFATE 50 % IJ SOLN
40.0000 meq | INTRAMUSCULAR | Status: DC
  Filled 2018-03-12: qty 9.85

## 2018-03-12 MED ORDER — SODIUM CHLORIDE 0.9 % IV SOLN
INTRAVENOUS | Status: DC
  Filled 2018-03-12: qty 30

## 2018-03-12 MED ORDER — POTASSIUM CHLORIDE 2 MEQ/ML IV SOLN
80.0000 meq | INTRAVENOUS | Status: DC
  Filled 2018-03-12: qty 40

## 2018-03-12 MED ORDER — INSULIN REGULAR(HUMAN) IN NACL 100-0.9 UT/100ML-% IV SOLN
INTRAVENOUS | Status: DC
  Filled 2018-03-12: qty 100

## 2018-03-12 MED ORDER — DOPAMINE-DEXTROSE 3.2-5 MG/ML-% IV SOLN
0.0000 ug/kg/min | INTRAVENOUS | Status: DC
  Filled 2018-03-12: qty 250

## 2018-03-12 MED ORDER — NOREPINEPHRINE 4 MG/250ML-% IV SOLN
0.0000 ug/min | INTRAVENOUS | Status: AC
  Administered 2018-03-13: 4 ug/min via INTRAVENOUS
  Filled 2018-03-12: qty 250

## 2018-03-13 ENCOUNTER — Inpatient Hospital Stay (HOSPITAL_COMMUNITY): Payer: BLUE CROSS/BLUE SHIELD | Admitting: Emergency Medicine

## 2018-03-13 ENCOUNTER — Other Ambulatory Visit: Payer: Self-pay

## 2018-03-13 ENCOUNTER — Inpatient Hospital Stay (HOSPITAL_COMMUNITY)
Admission: RE | Admit: 2018-03-13 | Discharge: 2018-03-15 | DRG: 267 | Disposition: A | Discharge status: Home / Self Care | Payer: BLUE CROSS/BLUE SHIELD | Attending: Cardiovascular Disease | Admitting: Cardiovascular Disease

## 2018-03-13 ENCOUNTER — Encounter (HOSPITAL_COMMUNITY)
Admission: RE | Disposition: A | Discharge status: Home / Self Care | Payer: Self-pay | Source: Home / Self Care | Attending: Cardiovascular Disease

## 2018-03-13 ENCOUNTER — Inpatient Hospital Stay (HOSPITAL_COMMUNITY): Payer: BLUE CROSS/BLUE SHIELD

## 2018-03-13 ENCOUNTER — Encounter (HOSPITAL_COMMUNITY): Payer: Self-pay

## 2018-03-13 ENCOUNTER — Inpatient Hospital Stay (HOSPITAL_COMMUNITY): Payer: BLUE CROSS/BLUE SHIELD | Admitting: Certified Registered"

## 2018-03-13 DIAGNOSIS — R000 Tachycardia, unspecified: Secondary | ICD-10-CM | POA: Diagnosis not present

## 2018-03-13 DIAGNOSIS — Z6832 Body mass index (BMI) 32.0-32.9, adult: Secondary | ICD-10-CM | POA: Diagnosis not present

## 2018-03-13 DIAGNOSIS — Q231 Congenital insufficiency of aortic valve: Secondary | ICD-10-CM | POA: Diagnosis present

## 2018-03-13 DIAGNOSIS — R339 Retention of urine, unspecified: Secondary | ICD-10-CM | POA: Diagnosis not present

## 2018-03-13 DIAGNOSIS — I25719 Atherosclerosis of autologous vein coronary artery bypass graft(s) with unspecified angina pectoris: Secondary | ICD-10-CM | POA: Diagnosis present

## 2018-03-13 DIAGNOSIS — I272 Pulmonary hypertension, unspecified: Secondary | ICD-10-CM | POA: Diagnosis present

## 2018-03-13 DIAGNOSIS — Z7982 Long term (current) use of aspirin: Secondary | ICD-10-CM

## 2018-03-13 DIAGNOSIS — R7302 Impaired glucose tolerance (oral): Secondary | ICD-10-CM | POA: Diagnosis present

## 2018-03-13 DIAGNOSIS — I25810 Atherosclerosis of coronary artery bypass graft(s) without angina pectoris: Secondary | ICD-10-CM | POA: Diagnosis present

## 2018-03-13 DIAGNOSIS — I5030 Unspecified diastolic (congestive) heart failure: Secondary | ICD-10-CM | POA: Diagnosis not present

## 2018-03-13 DIAGNOSIS — I252 Old myocardial infarction: Secondary | ICD-10-CM | POA: Diagnosis not present

## 2018-03-13 DIAGNOSIS — I4510 Unspecified right bundle-branch block: Secondary | ICD-10-CM | POA: Diagnosis not present

## 2018-03-13 DIAGNOSIS — I700 Atherosclerosis of aorta: Secondary | ICD-10-CM | POA: Diagnosis present

## 2018-03-13 DIAGNOSIS — E669 Obesity, unspecified: Secondary | ICD-10-CM | POA: Diagnosis present

## 2018-03-13 DIAGNOSIS — E876 Hypokalemia: Secondary | ICD-10-CM | POA: Diagnosis not present

## 2018-03-13 DIAGNOSIS — I350 Nonrheumatic aortic (valve) stenosis: Secondary | ICD-10-CM

## 2018-03-13 DIAGNOSIS — Z952 Presence of prosthetic heart valve: Secondary | ICD-10-CM

## 2018-03-13 DIAGNOSIS — Z951 Presence of aortocoronary bypass graft: Secondary | ICD-10-CM

## 2018-03-13 DIAGNOSIS — Z8249 Family history of ischemic heart disease and other diseases of the circulatory system: Secondary | ICD-10-CM | POA: Diagnosis not present

## 2018-03-13 DIAGNOSIS — Z79899 Other long term (current) drug therapy: Secondary | ICD-10-CM

## 2018-03-13 DIAGNOSIS — E785 Hyperlipidemia, unspecified: Secondary | ICD-10-CM | POA: Diagnosis present

## 2018-03-13 DIAGNOSIS — I10 Essential (primary) hypertension: Secondary | ICD-10-CM | POA: Diagnosis present

## 2018-03-13 DIAGNOSIS — Z006 Encounter for examination for normal comparison and control in clinical research program: Secondary | ICD-10-CM

## 2018-03-13 HISTORY — DX: Presence of prosthetic heart valve: Z95.2

## 2018-03-13 HISTORY — PX: INTRAOPERATIVE TRANSTHORACIC ECHOCARDIOGRAM: SHX6523

## 2018-03-13 HISTORY — PX: TRANSCATHETER AORTIC VALVE REPLACEMENT, TRANSFEMORAL: SHX6400

## 2018-03-13 LAB — POCT I-STAT, CHEM 8
BUN: 10 mg/dL (ref 8–23)
BUN: 9 mg/dL (ref 8–23)
BUN: 9 mg/dL (ref 8–23)
CALCIUM ION: 1.13 mmol/L — AB (ref 1.15–1.40)
CHLORIDE: 103 mmol/L (ref 98–111)
CHLORIDE: 104 mmol/L (ref 98–111)
CREATININE: 0.6 mg/dL — AB (ref 0.61–1.24)
CREATININE: 0.7 mg/dL (ref 0.61–1.24)
Calcium, Ion: 1.16 mmol/L (ref 1.15–1.40)
Calcium, Ion: 1.13 mmol/L — ABNORMAL LOW (ref 1.15–1.40)
Chloride: 103 mmol/L (ref 98–111)
Creatinine, Ser: 0.7 mg/dL (ref 0.61–1.24)
GLUCOSE: 116 mg/dL — AB (ref 70–99)
GLUCOSE: 147 mg/dL — AB (ref 70–99)
Glucose, Bld: 147 mg/dL — ABNORMAL HIGH (ref 70–99)
HCT: 41 % (ref 39.0–52.0)
HEMATOCRIT: 47 % (ref 39.0–52.0)
HEMATOCRIT: 41 % (ref 39.0–52.0)
HEMOGLOBIN: 16 g/dL (ref 13.0–17.0)
Hemoglobin: 13.9 g/dL (ref 13.0–17.0)
Hemoglobin: 13.9 g/dL (ref 13.0–17.0)
POTASSIUM: 3.8 mmol/L (ref 3.5–5.1)
Potassium: 3.7 mmol/L (ref 3.5–5.1)
Potassium: 3.9 mmol/L (ref 3.5–5.1)
SODIUM: 141 mmol/L (ref 135–145)
Sodium: 141 mmol/L (ref 135–145)
Sodium: 140 mmol/L (ref 135–145)
TCO2: 27 mmol/L (ref 22–32)
TCO2: 27 mmol/L (ref 22–32)
TCO2: 25 mmol/L (ref 22–32)

## 2018-03-13 LAB — POCT I-STAT 3, ART BLOOD GAS (G3+)
Acid-base deficit: 1 mmol/L (ref 0.0–2.0)
Bicarbonate: 24.4 mmol/L (ref 20.0–28.0)
O2 SAT: 100 %
PCO2 ART: 40.6 mmHg (ref 32.0–48.0)
PH ART: 7.385 (ref 7.350–7.450)
TCO2: 26 mmol/L (ref 22–32)
pO2, Arterial: 175 mmHg — ABNORMAL HIGH (ref 83.0–108.0)

## 2018-03-13 LAB — POCT I-STAT 4, (NA,K, GLUC, HGB,HCT)
Glucose, Bld: 138 mg/dL — ABNORMAL HIGH (ref 70–99)
HEMATOCRIT: 42 % (ref 39.0–52.0)
Hemoglobin: 14.3 g/dL (ref 13.0–17.0)
Potassium: 3.9 mmol/L (ref 3.5–5.1)
SODIUM: 141 mmol/L (ref 135–145)

## 2018-03-13 SURGERY — IMPLANTATION, AORTIC VALVE, TRANSCATHETER, FEMORAL APPROACH
Anesthesia: Monitor Anesthesia Care | Site: Chest

## 2018-03-13 MED ORDER — CHLORHEXIDINE GLUCONATE 0.12 % MT SOLN
15.0000 mL | Freq: Once | OROMUCOSAL | Status: AC
  Administered 2018-03-13: 15 mL via OROMUCOSAL
  Filled 2018-03-13: qty 15

## 2018-03-13 MED ORDER — PHENYLEPHRINE HCL-NACL 20-0.9 MG/250ML-% IV SOLN
0.0000 ug/min | INTRAVENOUS | Status: DC
  Administered 2018-03-13: 20 ug/min via INTRAVENOUS
  Filled 2018-03-13: qty 250

## 2018-03-13 MED ORDER — LIDOCAINE 2% (20 MG/ML) 5 ML SYRINGE
INTRAMUSCULAR | Status: DC | PRN
  Administered 2018-03-13: 60 mg via INTRAVENOUS

## 2018-03-13 MED ORDER — ACETAMINOPHEN 650 MG RE SUPP: 650.0000 mg | Freq: Four times a day (QID) | RECTAL | Status: DC | PRN

## 2018-03-13 MED ORDER — ASPIRIN EC 81 MG PO TBEC
81.0000 mg | DELAYED_RELEASE_TABLET | Freq: Every day | ORAL | Status: DC
  Administered 2018-03-14 – 2018-03-15 (×2): 81 mg via ORAL
  Filled 2018-03-13 (×2): qty 1

## 2018-03-13 MED ORDER — SODIUM CHLORIDE 0.9 % IV SOLN
1.5000 g | Freq: Two times a day (BID) | INTRAVENOUS | Status: AC
  Administered 2018-03-13 – 2018-03-15 (×4): 1.5 g via INTRAVENOUS
  Filled 2018-03-13 (×4): qty 1.5

## 2018-03-13 MED ORDER — MIDAZOLAM HCL 2 MG/2ML IJ SOLN
INTRAMUSCULAR | Status: AC
  Filled 2018-03-13: qty 2

## 2018-03-13 MED ORDER — ACETAMINOPHEN 325 MG PO TABS
650.0000 mg | ORAL_TABLET | Freq: Four times a day (QID) | ORAL | Status: DC | PRN
  Administered 2018-03-14: 650 mg via ORAL
  Filled 2018-03-13: qty 2

## 2018-03-13 MED ORDER — PROPOFOL 10 MG/ML IV BOLUS
INTRAVENOUS | Status: DC | PRN
  Administered 2018-03-13: 20 mg via INTRAVENOUS

## 2018-03-13 MED ORDER — SODIUM CHLORIDE 0.9% FLUSH
3.0000 mL | Freq: Two times a day (BID) | INTRAVENOUS | Status: DC
  Administered 2018-03-13 – 2018-03-15 (×3): 3 mL via INTRAVENOUS

## 2018-03-13 MED ORDER — LACTATED RINGERS IV SOLN
INTRAVENOUS | Status: DC | PRN
  Administered 2018-03-13: 10:00:00 via INTRAVENOUS

## 2018-03-13 MED ORDER — NITROGLYCERIN IN D5W 200-5 MCG/ML-% IV SOLN: 0.0000 ug/min | INTRAVENOUS | Status: DC

## 2018-03-13 MED ORDER — ATORVASTATIN CALCIUM 80 MG PO TABS
80.0000 mg | ORAL_TABLET | Freq: Every day | ORAL | Status: DC
  Administered 2018-03-13 – 2018-03-14 (×2): 80 mg via ORAL
  Filled 2018-03-13 (×2): qty 1

## 2018-03-13 MED ORDER — PROTAMINE SULFATE 10 MG/ML IV SOLN
INTRAVENOUS | Status: DC | PRN
  Administered 2018-03-13: 10 mg via INTRAVENOUS
  Administered 2018-03-13: 80 mg via INTRAVENOUS

## 2018-03-13 MED ORDER — METOPROLOL TARTRATE 5 MG/5ML IV SOLN: 2.5000 mg | INTRAVENOUS | Status: DC | PRN

## 2018-03-13 MED ORDER — SODIUM CHLORIDE 0.9 % IV SOLN
INTRAVENOUS | Status: DC
  Administered 2018-03-13: 09:00:00 via INTRAVENOUS

## 2018-03-13 MED ORDER — CHLORHEXIDINE GLUCONATE 4 % EX LIQD: 60.0000 mL | Freq: Once | CUTANEOUS | Status: DC

## 2018-03-13 MED ORDER — SODIUM CHLORIDE 0.9% FLUSH: 3.0000 mL | INTRAVENOUS | Status: DC | PRN

## 2018-03-13 MED ORDER — PROPOFOL 500 MG/50ML IV EMUL
INTRAVENOUS | Status: DC | PRN
  Administered 2018-03-13: 10 ug/kg/min via INTRAVENOUS

## 2018-03-13 MED ORDER — SODIUM CHLORIDE 0.9 % IV SOLN: 250.0000 mL | INTRAVENOUS | Status: DC | PRN

## 2018-03-13 MED ORDER — OXYCODONE HCL 5 MG PO TABS
5.0000 mg | ORAL_TABLET | ORAL | Status: DC | PRN
  Administered 2018-03-14: 5 mg via ORAL
  Filled 2018-03-13: qty 1

## 2018-03-13 MED ORDER — FENTANYL CITRATE (PF) 100 MCG/2ML IJ SOLN
100.0000 ug | Freq: Once | INTRAMUSCULAR | Status: AC
  Administered 2018-03-13: 100 ug via INTRAVENOUS

## 2018-03-13 MED ORDER — VANCOMYCIN HCL IN DEXTROSE 1-5 GM/200ML-% IV SOLN
1000.0000 mg | Freq: Once | INTRAVENOUS | Status: AC
  Administered 2018-03-13: 1000 mg via INTRAVENOUS
  Filled 2018-03-13: qty 200

## 2018-03-13 MED ORDER — IODIXANOL 320 MG/ML IV SOLN
INTRAVENOUS | Status: DC | PRN
  Administered 2018-03-13: 60 mL via INTRAVENOUS

## 2018-03-13 MED ORDER — LIDOCAINE HCL 1 % IJ SOLN
INTRAMUSCULAR | Status: AC
  Filled 2018-03-13: qty 20

## 2018-03-13 MED ORDER — LIDOCAINE HCL 1 % IJ SOLN
INTRAMUSCULAR | Status: DC | PRN
  Administered 2018-03-13: 20 mL

## 2018-03-13 MED ORDER — MORPHINE SULFATE (PF) 2 MG/ML IV SOLN
2.0000 mg | INTRAVENOUS | Status: DC | PRN
  Administered 2018-03-13 – 2018-03-14 (×2): 4 mg via INTRAVENOUS
  Filled 2018-03-13 (×2): qty 2

## 2018-03-13 MED ORDER — SODIUM CHLORIDE 0.9 % IV SOLN
INTRAVENOUS | Status: AC
  Filled 2018-03-13 (×3): qty 1.2

## 2018-03-13 MED ORDER — ONDANSETRON HCL 4 MG/2ML IJ SOLN
4.0000 mg | Freq: Four times a day (QID) | INTRAMUSCULAR | Status: DC | PRN
  Administered 2018-03-14 (×3): 4 mg via INTRAVENOUS
  Filled 2018-03-13 (×3): qty 2

## 2018-03-13 MED ORDER — FENTANYL CITRATE (PF) 100 MCG/2ML IJ SOLN
INTRAMUSCULAR | Status: AC
  Administered 2018-03-13: 100 ug via INTRAVENOUS
  Filled 2018-03-13: qty 2

## 2018-03-13 MED ORDER — SODIUM CHLORIDE 0.9 % IV SOLN: INTRAVENOUS | Status: AC

## 2018-03-13 MED ORDER — CHLORHEXIDINE GLUCONATE 4 % EX LIQD: 30.0000 mL | CUTANEOUS | Status: DC

## 2018-03-13 MED ORDER — TRAMADOL HCL 50 MG PO TABS
50.0000 mg | ORAL_TABLET | ORAL | Status: DC | PRN
  Administered 2018-03-14: 50 mg via ORAL
  Filled 2018-03-13: qty 1

## 2018-03-13 MED ORDER — HEPARIN SODIUM (PORCINE) 1000 UNIT/ML IJ SOLN
INTRAMUSCULAR | Status: DC | PRN
  Administered 2018-03-13: 9000 [IU] via INTRAVENOUS

## 2018-03-13 MED ORDER — PHENYLEPHRINE HCL 10 MG/ML IJ SOLN
INTRAMUSCULAR | Status: DC | PRN
  Administered 2018-03-13: 200 ug via INTRAVENOUS

## 2018-03-13 MED ORDER — CLOPIDOGREL BISULFATE 75 MG PO TABS
75.0000 mg | ORAL_TABLET | Freq: Every day | ORAL | Status: DC
  Administered 2018-03-14 – 2018-03-15 (×2): 75 mg via ORAL
  Filled 2018-03-13 (×2): qty 1

## 2018-03-13 MED ORDER — SODIUM CHLORIDE 0.9 % IV SOLN
INTRAVENOUS | Status: DC | PRN
  Administered 2018-03-13: 1500 mL

## 2018-03-13 SURGICAL SUPPLY — 86 items
BAG DECANTER FOR FLEXI CONT (MISCELLANEOUS) IMPLANT
BAG SNAP BAND KOVER 36X36 (MISCELLANEOUS) ×6 IMPLANT
BLADE CLIPPER SURG (BLADE) IMPLANT
BLADE OSCILLATING /SAGITTAL (BLADE) IMPLANT
BLADE STERNUM SYSTEM 6 (BLADE) IMPLANT
CABLE ADAPT CONN TEMP 6FT (ADAPTER) ×3 IMPLANT
CANNULA FEM VENOUS REMOTE 22FR (CANNULA) IMPLANT
CANNULA OPTISITE PERFUSION 16F (CANNULA) IMPLANT
CANNULA OPTISITE PERFUSION 18F (CANNULA) IMPLANT
CATH DIAG EXPO 6F VENT PIG 145 (CATHETERS) ×6 IMPLANT
CATH EXPO 5FR AL1 (CATHETERS) IMPLANT
CATH INFINITI 6F AL2 (CATHETERS) IMPLANT
CATH S G BIP PACING (SET/KITS/TRAYS/PACK) ×3 IMPLANT
CLIP VESOCCLUDE MED 24/CT (CLIP) IMPLANT
CLIP VESOCCLUDE SM WIDE 24/CT (CLIP) IMPLANT
CONT SPEC 4OZ CLIKSEAL STRL BL (MISCELLANEOUS) ×6 IMPLANT
COVER BACK TABLE 24X17X13 BIG (DRAPES) IMPLANT
COVER BACK TABLE 80X110 HD (DRAPES) ×3 IMPLANT
COVER DOME SNAP 22 D (MISCELLANEOUS) IMPLANT
COVER WAND RF STERILE (DRAPES) ×3 IMPLANT
CRADLE DONUT ADULT HEAD (MISCELLANEOUS) ×3 IMPLANT
DERMABOND ADVANCED (GAUZE/BANDAGES/DRESSINGS) ×1
DERMABOND ADVANCED .7 DNX12 (GAUZE/BANDAGES/DRESSINGS) ×2 IMPLANT
DEVICE CLOSURE PERCLS PRGLD 6F (VASCULAR PRODUCTS) ×4 IMPLANT
DRAPE INCISE IOBAN 66X45 STRL (DRAPES) IMPLANT
DRSG TEGADERM 4X4.75 (GAUZE/BANDAGES/DRESSINGS) ×6 IMPLANT
ELECT CAUTERY BLADE 6.4 (BLADE) IMPLANT
ELECT REM PT RETURN 9FT ADLT (ELECTROSURGICAL) ×6
ELECTRODE REM PT RTRN 9FT ADLT (ELECTROSURGICAL) ×4 IMPLANT
FELT TEFLON 6X6 (MISCELLANEOUS) IMPLANT
FEMORAL VENOUS CANN RAP (CANNULA) IMPLANT
GAUZE SPONGE 4X4 12PLY STRL (GAUZE/BANDAGES/DRESSINGS) ×3 IMPLANT
GLOVE BIO SURGEON STRL SZ7.5 (GLOVE) ×3 IMPLANT
GLOVE BIO SURGEON STRL SZ8 (GLOVE) IMPLANT
GLOVE EUDERMIC 7 POWDERFREE (GLOVE) IMPLANT
GLOVE ORTHO TXT STRL SZ7.5 (GLOVE) IMPLANT
GOWN STRL REUS W/ TWL LRG LVL3 (GOWN DISPOSABLE) IMPLANT
GOWN STRL REUS W/ TWL XL LVL3 (GOWN DISPOSABLE) ×2 IMPLANT
GOWN STRL REUS W/TWL LRG LVL3 (GOWN DISPOSABLE)
GOWN STRL REUS W/TWL XL LVL3 (GOWN DISPOSABLE) ×1
GUIDEWIRE SAFE TJ AMPLATZ EXST (WIRE) ×6 IMPLANT
GUIDEWIRE STRAIGHT .035 260CM (WIRE) ×3 IMPLANT
INSERT FOGARTY SM (MISCELLANEOUS) IMPLANT
KIT BASIN OR (CUSTOM PROCEDURE TRAY) ×3 IMPLANT
KIT DILATOR VASC 18G NDL (KITS) IMPLANT
KIT HEART LEFT (KITS) ×3 IMPLANT
KIT SUCTION CATH 14FR (SUCTIONS) IMPLANT
KIT TURNOVER KIT B (KITS) ×3 IMPLANT
LOOP VESSEL MAXI BLUE (MISCELLANEOUS) IMPLANT
LOOP VESSEL MINI RED (MISCELLANEOUS) IMPLANT
NEEDLE 22X1 1/2 (OR ONLY) (NEEDLE) ×3 IMPLANT
NEEDLE PERC 18GX7CM (NEEDLE) ×6 IMPLANT
NS IRRIG 1000ML POUR BTL (IV SOLUTION) ×3 IMPLANT
PACK ENDOVASCULAR (PACKS) ×3 IMPLANT
PAD ARMBOARD 7.5X6 YLW CONV (MISCELLANEOUS) ×6 IMPLANT
PAD ELECT DEFIB RADIOL ZOLL (MISCELLANEOUS) ×3 IMPLANT
PENCIL BUTTON HOLSTER BLD 10FT (ELECTRODE) IMPLANT
PERCLOSE PROGLIDE 6F (VASCULAR PRODUCTS) ×6
SET MICROPUNCTURE 5F STIFF (MISCELLANEOUS) ×3 IMPLANT
SHEATH BRITE TIP 6FR 35CM (SHEATH) ×3 IMPLANT
SHEATH PINNACLE 6F 10CM (SHEATH) ×3 IMPLANT
SHEATH PINNACLE 8F 10CM (SHEATH) ×3 IMPLANT
SLEEVE REPOSITIONING LENGTH 30 (MISCELLANEOUS) ×3 IMPLANT
SPONGE LAP 4X18 RFD (DISPOSABLE) IMPLANT
STOPCOCK MORSE 400PSI 3WAY (MISCELLANEOUS) ×6 IMPLANT
SUT ETHIBOND X763 2 0 SH 1 (SUTURE) IMPLANT
SUT GORETEX CV 4 TH 22 36 (SUTURE) IMPLANT
SUT GORETEX CV4 TH-18 (SUTURE) IMPLANT
SUT MNCRL AB 3-0 PS2 18 (SUTURE) IMPLANT
SUT PROLENE 5 0 C 1 36 (SUTURE) IMPLANT
SUT PROLENE 6 0 C 1 30 (SUTURE) IMPLANT
SUT SILK  1 MH (SUTURE) ×1
SUT SILK 1 MH (SUTURE) ×2 IMPLANT
SUT VIC AB 2-0 CT1 27 (SUTURE)
SUT VIC AB 2-0 CT1 TAPERPNT 27 (SUTURE) IMPLANT
SUT VIC AB 2-0 CTX 36 (SUTURE) IMPLANT
SUT VIC AB 3-0 SH 8-18 (SUTURE) IMPLANT
SYR 50ML LL SCALE MARK (SYRINGE) ×3 IMPLANT
SYR BULB IRRIGATION 50ML (SYRINGE) IMPLANT
SYR CONTROL 10ML LL (SYRINGE) IMPLANT
TAPE CLOTH SURG 4X10 WHT LF (GAUZE/BANDAGES/DRESSINGS) ×3 IMPLANT
TOWEL GREEN STERILE (TOWEL DISPOSABLE) ×6 IMPLANT
TRANSDUCER W/STOPCOCK (MISCELLANEOUS) ×6 IMPLANT
TRAY FOLEY SLVR 16FR TEMP STAT (SET/KITS/TRAYS/PACK) IMPLANT
VALVE HEART TRANSCATH SZ3 26MM (Prosthesis & Implant Heart) ×3 IMPLANT
WIRE .035 3MM-J 145CM (WIRE) ×3 IMPLANT

## 2018-03-13 NOTE — Interval H&P Note (Signed)
History and Physical Interval Note:  03/13/2018 10:08 AM  James Yates  has presented today for surgery, with the diagnosis of Severe Aortic Stenosis  The various methods of treatment have been discussed with the patient and family. After consideration of risks, benefits and other options for treatment, the patient has consented to  Procedure(s): TRANSCATHETER AORTIC VALVE REPLACEMENT, TRANSFEMORAL (N/A) INTRAOPERATIVE TRANSTHORACIC ECHOCARDIOGRAM (N/A) as a surgical intervention .  The patient's history has been reviewed, patient examined, no change in status, stable for surgery.  I have reviewed the patient's chart and labs.  Questions were answered to the patient's satisfaction.     [name]

## 2018-03-13 NOTE — Op Note (Signed)
[facility]   MULTIDISCIPLINARY HEART VALVE TEAM   TAVR OPERATIVE NOTE   Date of Procedure:  03/13/2018  Preoperative Diagnosis: Severe Aortic Stenosis   Postoperative Diagnosis: Same   Procedure:    Transcatheter Aortic Valve Replacement - Percutaneous Right Transfemoral Approach  Edwards Sapien 3 THV (size 26 mm, model # 9600TFX, serial # [id number])   Co-Surgeons:  [name], MD and Salvatore Decent. Cornelius Moras, MD   Anesthesiologist:  Karna Christmas, MD  Echocardiographer:  Tobias Alexander, MD  Pre-operative Echo Findings:  Severe aortic stenosis  Normal left ventricular systolic function  Post-operative Echo Findings:  No paravalvular leak  Normal left ventricular systolic function   BRIEF CLINICAL NOTE AND INDICATIONS FOR SURGERY  Patient is a 62 year old male with history of coronary artery disease status post coronary artery bypass grafting x4 in 2015, bicuspid aortic valve with aortic stenosis, hypertension, and hyperlipidemia who has been referred for a second surgical opinion to discuss treatment options for management of severe symptomatic aortic stenosis and coronary artery disease.  The patient's cardiac history dates back to 2015 when he presented with an acute non-ST segment elevation myocardial infarction.  He was found to have multivessel coronary artery disease and he underwent coronary artery bypass grafting x4 by Dr. Donata Clay.  Grafts placed at the time of surgery included left internal mammary artery to the distal left anterior descending coronary artery, saphenous vein graft to the posterior descending coronary artery, and sequential saphenous vein graft to the first and third obtuse marginal branches of the left circumflex system.  TEE performed at the time of surgery revealed bicuspid aortic valve with valve area estimated between 1.0 and 1.2 cm although mean transvalvular gradient was measured only millimeters me.  The patient's  postoperative convalescence was uneventful.  The patient has been followed carefully by Dr. Kirke Corin ever since.  The patient states that it took approximately 6 months for him to recover completely but he has continued to experience symptoms of exertional chest pressure and shortness of breath ever since.  In 2017 follow-up catheterization was performed because of symptoms of exertional angina.  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  There was felt to be moderate aortic stenosis with peak to peak transvalvular gradient reported 22 mmHg.  Follow-up echocardiogram performed Oct 10, 2017 revealed findings suggestive of significant regression of the severity of aortic stenosis with peak velocity across the aortic valve measured a size 4.2 m/s corresponding to mean transvalvular gradient estimated 38 mmHg.  Left ventricular systolic function remain normal.  Patient underwent diagnostic cardiac catheterization on November 27, 2017.  By catheterization mean transvalvular gradient was measured 26 mmHg.  Coronary angiography revealed no further progression of disease with continued patency of the left internal mammary artery to the distal left anterior descending coronary artery and saphenous vein graft to the posterior descending coronary artery.  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  The terminal branches of left circumflex filled via left to left collaterals.  The patient was referred for surgical consultation and has been evaluated previously by Dr. Donata Clay who recommended transcatheter aortic valve replacement.  The patient was seen by Dr. Clifton James on December 25, 2017.  For a variety of reasons the patient desires to hold off on intervention at that time and he has now been referred for a second surgical opinion.  During the course of the patient's preoperative work up they have been evaluated comprehensively by a multidisciplinary team of specialists coordinated through the Multidisciplinary Heart Valve Clinic in the [facility].  They have been demonstrated to suffer from symptomatic severe aortic stenosis as noted above. The patient has been counseled extensively as to the relative risks and benefits of all options for the treatment of severe aortic stenosis including long term medical therapy, conventional surgery for aortic valve replacement, and transcatheter aortic valve replacement.  All questions have been answered, and the patient provides full informed consent for the operation as described.   DETAILS OF THE OPERATIVE PROCEDURE  PREPARATION:    The patient is brought to the operating room on the above mentioned date and central monitoring was established by the anesthesia team including placement of a central venous line and radial arterial line. The patient is placed in the supine position on the operating table.  Intravenous antibiotics are administered. The patient is monitored closely throughout the procedure under conscious sedation.  Baseline transthoracic echocardiogram was performed. The patient's chest, abdomen, both groins, and both lower extremities are prepared and draped in a sterile manner. A time out procedure is performed.   PERIPHERAL ACCESS:    Using the modified Seldinger technique, femoral arterial and venous access was obtained with placement of 6 Fr sheaths on the left side.  A pigtail diagnostic catheter was passed through the left arterial sheath under fluoroscopic guidance into the aortic root.  A temporary transvenous pacemaker catheter was passed through the left femoral venous sheath under fluoroscopic  guidance into the right ventricle.  The pacemaker was tested to ensure stable lead placement and pacemaker capture. Aortic root angiography was performed in order to determine the optimal angiographic angle for valve deployment.   TRANSFEMORAL ACCESS:   Percutaneous transfemoral access and sheath placement was performed by Dr. Clifton James using ultrasound guidance.  The right common femoral artery was cannulated using a micropuncture needle and appropriate location was verified using hand injection angiogram.  A pair of Abbott Perclose percutaneous closure devices were placed and a 6 French sheath replaced into the femoral artery.  The patient was heparinized systemically and ACT verified > 250 seconds.    A 14 Fr transfemoral E-sheath was introduced into the right common femoral artery after progressively dilating over an Amplatz superstiff wire. An AL-2 catheter was used to direct a straight-tip exchange length wire across the native aortic valve into the left ventricle. This was exchanged out for a pigtail catheter and position was confirmed in the LV apex. Simultaneous LV and Ao pressures were recorded.  The pigtail catheter was exchanged for an Amplatz Extra-stiff wire in the LV apex.  Echocardiography was utilized to confirm appropriate wire position and no sign of entanglement in the mitral subvalvular apparatus.   TRANSCATHETER HEART VALVE DEPLOYMENT:   An Edwards Sapien 3 transcatheter heart valve (size 26 mm, model #9600TFX, serial #[id number]) was prepared and crimped per manufacturer's guidelines, and the proper orientation of the valve is confirmed on the [employer] delivery system. The valve was advanced through the introducer sheath using normal technique until in an appropriate position in the abdominal aorta beyond the sheath tip. The balloon was then retracted and using the fine-tuning wheel was centered on the valve. The valve was then  advanced across the aortic arch using appropriate  flexion of the catheter. The valve was carefully positioned across the aortic valve annulus. The Commander catheter was retracted using normal technique. Once final position of the valve has been confirmed by angiographic assessment, the valve is deployed while temporarily holding ventilation and during rapid ventricular pacing to maintain systolic blood pressure < 50 mmHg and pulse pressure < 10 mmHg. The balloon inflation is held for >3 seconds after reaching full deployment volume. Once the balloon has fully deflated the balloon is retracted into the ascending aorta and valve function is assessed using echocardiography. There is felt to be no paravalvular leak and no central aortic insufficiency.  The patient's hemodynamic recovery following valve deployment is good.  The deployment balloon and guidewire are both removed.    PROCEDURE COMPLETION:   The sheath was removed and femoral artery closure performed by Dr Clifton James.  Protamine was administered once femoral arterial repair was complete. The pigtail catheters and femoral sheaths were removed with manual pressure used for hemostasis.  The temporary pacemaker was left in place.  The patient tolerated the procedure well and is transported to the surgical intensive care in stable condition. There were no immediate intraoperative complications. All sponge instrument and needle counts are verified correct at completion of the operation.   No blood products were administered during the operation.  The patient received a total of 60.6 mL of intravenous contrast during the procedure.   Purcell Nails, MD 03/13/2018 11:39 AM

## 2018-03-13 NOTE — Anesthesia Procedure Notes (Signed)
Procedure Name: MAC Date/Time: 03/13/2018 10:05 AM Performed by: Murvin Natal, MD Pre-anesthesia Checklist: Patient identified, Emergency Drugs available, Suction available, Patient being monitored and Timeout performed Patient Re-evaluated:Patient Re-evaluated prior to induction Oxygen Delivery Method: Simple face mask Placement Confirmation: positive ETCO2 Dental Injury: Teeth and Oropharynx as per pre-operative assessment

## 2018-03-13 NOTE — Progress Notes (Signed)
[facility] BRIEF SICU PROGRESS NOTE  Day of Surgery  S/P Procedure(s) (LRB): TRANSCATHETER AORTIC VALVE REPLACEMENT, TRANSFEMORAL. Edwards Sapien 3 Transcatheter Heart Valve size 26mm. (N/A) INTRAOPERATIVE TRANSTHORACIC ECHOCARDIOGRAM (N/A)   Patient looks good and remains stable in NSR Unable to void urine - bladder scan > 1 liter  Plan: Will place Foley cath for overnight  Purcell Nails, MD 03/13/2018 5:57 PM

## 2018-03-13 NOTE — Anesthesia Procedure Notes (Addendum)
Arterial Line Insertion Performed by: Rosalio Macadamia, CRNA, CRNA  Preanesthetic checklist: patient identified, IV checked, site marked, risks and benefits discussed, surgical consent, monitors and equipment checked, pre-op evaluation, timeout performed and anesthesia consent Lidocaine 1% used for infiltration Right, radial was placed Catheter size: 20 G Hand hygiene performed  and maximum sterile barriers used  Allen's test indicative of satisfactory collateral circulation Attempts: 1 Procedure performed without using ultrasound guided technique. Following insertion, Biopatch and dressing applied. Post procedure assessment: normal  Patient tolerated the procedure well with no immediate complications.

## 2018-03-13 NOTE — Transfer of Care (Signed)
Immediate Anesthesia Transfer of Care Note  Patient: James Yates  Procedure(s) Performed: TRANSCATHETER AORTIC VALVE REPLACEMENT, TRANSFEMORAL. Edwards Sapien 3 Transcatheter Heart Valve size 41m. (N/A Chest) INTRAOPERATIVE TRANSTHORACIC ECHOCARDIOGRAM (N/A Chest)  Patient Location: SICU  Anesthesia Type:MAC  Level of Consciousness: drowsy  Airway & Oxygen Therapy: Patient Spontanous Breathing, Patient connected to nasal cannula oxygen and Patient connected to face mask oxygen  Post-op Assessment: Report given to RN, Post -op Vital signs reviewed and stable and Patient moving all extremities  Post vital signs: Reviewed and stable  Last Vitals:  Vitals Value Taken Time  BP    Temp    Pulse 74 03/13/2018 12:04 PM  Resp 15 03/13/2018 12:04 PM  SpO2 100 % 03/13/2018 12:04 PM  Vitals shown include unvalidated device data.  Last Pain:  Vitals:   03/13/18 0943  TempSrc:   PainSc: 0-No pain         Complications: No apparent anesthesia complications

## 2018-03-13 NOTE — CV Procedure (Signed)
[facility]  TAVR OPERATIVE NOTE   Date of Procedure:  03/13/2018  Preoperative Diagnosis: Severe Aortic Stenosis   Postoperative Diagnosis: Same   Procedure:    Transcatheter Aortic Valve Replacement - Transfemoral Approach  Edwards Sapien 3 THV (size 26 mm, model # [id number], serial # [id number])   Co-Surgeons:  Lauree Chandler, MD and Valentina Gu. Roxy Manns, MD  Anesthesiologist:  Roanna Banning  Echocardiographer:  Meda Coffee  Pre-operative Echo Findings:  Severe aortic stenosis  Normal left ventricular systolic function  Post-operative Echo Findings:  No paravalvular leak  Normal left ventricular systolic function  BRIEF CLINICAL NOTE AND INDICATIONS FOR SURGERY  62 yo male with history of CAD s/p CABG, bicuspid aortic valve with severe stenosis, HLD, HTN and obesity here today for TAVR. He is known to have CAD and underwent CABG in February 2015. At the time of his CABG he had mild aortic stenosis. Cardiac cath July 2019 with patent LIMA to LAD and patent SVG to RCA with known occlusion of the SVG to the obtuse marginal branch. The obtuse marginal fills from right to left collaterals. Echo May 2019 with normal LV systolic function, LVEF 67-89%. 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 The mean gradient across the valve is 38 mmHg and the peak gradient is 71 mmHg. DVI 0.24. AVA 0.6 cm2. This is consistent with severe aortic valve stenosis. He has been seen by Dr. Prescott Gum and is felt to be a better candidate for TAVR than redo open surgical procedure. He has undergone CT scans in preparation for potential TAVR. He has also been seen by Dr. Roxy Manns in the [place] surgery office.   During the course of the patient's preoperative work up they have been evaluated comprehensively by a multidisciplinary team of specialists coordinated through the [facility] in the [facility].  They have been  demonstrated to suffer from symptomatic severe aortic stenosis as noted above. The patient has been counseled extensively as to the relative risks and benefits of all options for the treatment of severe aortic stenosis including long term medical therapy, conventional surgery for aortic valve replacement, and transcatheter aortic valve replacement.  The patient has been independently evaluated by two cardiac surgeons including Dr Roxy Manns and Dr. Prescott Gum and they are felt to be at high risk for conventional surgical aortic valve replacement. Both surgeons indicated the patient would be a poor candidate for conventional surgery. Based upon review of all of the patient's preoperative diagnostic tests they are felt to be candidate for transcatheter aortic valve replacement using the transfemoral approach as an alternative to high risk conventional surgery.    Following the decision to proceed with transcatheter aortic valve replacement, a discussion has been held regarding what types of management strategies would be attempted intraoperatively in the event of life-threatening complications, including whether or not the patient would be considered a candidate for the use of cardiopulmonary bypass and/or conversion to open sternotomy for attempted surgical intervention.  The patient has been advised of a variety of complications that might develop peculiar to this approach including but not limited to risks of death, stroke, paravalvular leak, aortic dissection or other major vascular complications, aortic annulus rupture, device embolization, cardiac rupture or perforation, acute myocardial infarction, arrhythmia, heart block or bradycardia requiring permanent pacemaker placement, congestive heart failure, respiratory failure, renal failure, pneumonia, infection, other late complications related to structural valve deterioration or migration, or other complications that might  ultimately cause a temporary or permanent  loss of functional independence or other long term morbidity.  The patient provides full informed consent for the procedure as described and all questions were answered preoperatively.    DETAILS OF THE OPERATIVE PROCEDURE  PREPARATION:   The patient is brought to the operating room on the above mentioned date and central monitoring was established by the anesthesia team including placement of a radial arterial line. The patient is placed in the supine position on the operating table.  Intravenous antibiotics are administered. Conscious sedation is used.   Baseline transthoracic echocardiogram was performed. The patient's chest, abdomen, both groins, and both lower extremities are prepared and draped in a sterile manner. A time out procedure is performed.   PERIPHERAL ACCESS:   Using the modified Seldinger technique, femoral arterial and venous access were obtained with placement of 6 Fr sheaths on the left side.  A pigtail diagnostic catheter was passed through the femoral arterial sheath under fluoroscopic guidance into the aortic root.  A temporary transvenous pacemaker catheter was passed through the femoral venous sheath under fluoroscopic guidance into the right ventricle.  The pacemaker was tested to ensure stable lead placement and pacemaker capture. Aortic root angiography was performed in order to determine the optimal angiographic angle for valve deployment.  TRANSFEMORAL ACCESS:  A micropuncture kit was used to gain access to the right femoral artery using u/s guidance. Position confirmed with angiography. Pre-closure with double ProGlide closure devices. The patient was heparinized systemically and ACT verified > 250 seconds.    A 14 Fr transfemoral E-sheath was introduced into the right femoral artery after progressively dilating over an Amplatz superstiff wire. An AL-2 catheter was used to direct a straight-tip exchange length wire across the native aortic valve into the left  ventricle. This was exchanged out for a pigtail catheter and position was confirmed in the LV apex. Simultaneous LV and Ao pressures were recorded.  The pigtail catheter was then exchanged for an Amplatz Extra-stiff wire in the LV apex.   TRANSCATHETER HEART VALVE DEPLOYMENT:  An Edwards Sapien 3 THV (size 26 mm) was prepared and crimped per manufacturer's guidelines, and the proper orientation of the valve is confirmed on the [employer] delivery system. The valve was advanced through the introducer sheath using normal technique until in an appropriate position in the abdominal aorta beyond the sheath tip. The balloon was then retracted and using the fine-tuning wheel was centered on the valve. The valve was then advanced across the aortic arch using appropriate flexion of the catheter. The valve was carefully positioned across the aortic valve annulus. The Commander catheter was retracted using normal technique. Once final position of the valve has been confirmed by angiographic assessment, the valve is deployed while temporarily holding ventilation and during rapid ventricular pacing to maintain systolic blood pressure < 50 mmHg and pulse pressure < 10 mmHg. The balloon inflation is held for >3 seconds after reaching full deployment volume. Once the balloon has fully deflated the balloon is retracted into the ascending aorta and valve function is assessed using TTE. There is felt to be no paravalvular leak and no central aortic insufficiency.  The patient's hemodynamic recovery following valve deployment is good.  The deployment balloon and guidewire are both removed. Echo demostrated acceptable post-procedural gradients, stable mitral valve function, and no AI.   PROCEDURE COMPLETION:  The sheath was then removed and closure devices were completed. Protamine was administered once femoral arterial repair was complete. The temporary  pacemaker, pigtail catheters and femoral sheaths were removed with  manual pressure used for hemostasis.   The patient tolerated the procedure well and is transported to the surgical intensive care in stable condition. There were no immediate intraoperative complications. All sponge instrument and needle counts are verified correct at completion of the operation.   No blood products were administered during the operation.  The patient received a total of 60 mL of intravenous contrast during the procedure.  Lauree Chandler MD 03/13/2018 10:07 AM

## 2018-03-13 NOTE — Anesthesia Postprocedure Evaluation (Signed)
Anesthesia Post Note  Patient: James Yates  Procedure(s) Performed: TRANSCATHETER AORTIC VALVE REPLACEMENT, TRANSFEMORAL. Edwards Sapien 3 Transcatheter Heart Valve size 28m. (N/A Chest) INTRAOPERATIVE TRANSTHORACIC ECHOCARDIOGRAM (N/A Chest)     Patient location during evaluation: ICU Anesthesia Type: MAC Level of consciousness: awake and alert Pain management: pain level controlled Vital Signs Assessment: post-procedure vital signs reviewed and stable Respiratory status: spontaneous breathing, nonlabored ventilation, respiratory function stable and patient connected to nasal cannula oxygen Cardiovascular status: stable and blood pressure returned to baseline Postop Assessment: no apparent nausea or vomiting Anesthetic complications: no    Last Vitals:  Vitals:   03/13/18 1400 03/13/18 1430  BP: 104/78 100/70  Pulse: 67 63  Resp: 11 12  Temp:    SpO2: 96% 96%    Last Pain:  Vitals:   03/13/18 1400  TempSrc:   PainSc: 0-No pain                 Ryan P Ellender

## 2018-03-13 NOTE — Anesthesia Procedure Notes (Signed)
Central Venous Catheter Insertion Performed by: Leonides Grills, MD, anesthesiologist Start/End10/15/2019 9:25 AM, 03/13/2018 9:35 AM Patient location: Pre-op. Preanesthetic checklist: patient identified, IV checked, site marked, risks and benefits discussed, surgical consent, monitors and equipment checked, pre-op evaluation, timeout performed and anesthesia consent Position: Trendelenburg Lidocaine 1% used for infiltration and patient sedated Hand hygiene performed , maximum sterile barriers used  and Seldinger technique used Catheter size: 8 Fr Total catheter length 16. Central line was placed.Double lumen Procedure performed using ultrasound guided technique. Ultrasound Notes:anatomy identified, needle tip was noted to be adjacent to the nerve/plexus identified, no ultrasound evidence of intravascular and/or intraneural injection and image(s) printed for medical record Attempts: 1 Following insertion, dressing applied, line sutured and Biopatch. Post procedure assessment: blood return through all ports, free fluid flow and no air  Patient tolerated the procedure well with no immediate complications.

## 2018-03-13 NOTE — Progress Notes (Addendum)
  [facility]   MULTIDISCIPLINARY HEART VALVE TEAM  Patient doing well s/p TAVR. He is hemodynamically stable. Groin sites stable. ECG with no high grade block. Temp wire left in place given pre existing RBBB and risk for CHB. Plan for early ambulation after bedrest completed and temp wire removed and hopeful discharge over the next 24-48 hours.   Cline Crock PA-C  [facility]  Pager [phone]

## 2018-03-13 NOTE — Anesthesia Preprocedure Evaluation (Addendum)
Anesthesia Evaluation  Patient identified by MRN, date of birth, ID band Patient awake    Reviewed: Allergy & Precautions, NPO status , Patient's Chart, lab work & pertinent test results  Airway Mallampati: II  TM Distance: >3 FB Neck ROM: Full    Dental no notable dental hx.    Pulmonary neg pulmonary ROS,    Pulmonary exam normal breath sounds clear to auscultation       Cardiovascular hypertension, Pt. on medications and Pt. on home beta blockers + CAD, + Past MI and + CABG  + Valvular Problems/Murmurs AS  Rhythm:Regular Rate:Normal + Systolic murmurs ECG: rate 74. Normal sinus rhythm Right bundle branch block  ECHO: Normal LV systolic function; mild diastolic dysfunction; mild LVH; calcified aortic valve with moderate AS (mean gradient 30 mmHg).  Aortic valve:   Trileaflet; moderately calcified leaflets. Valve mobility was restricted.  Doppler:   There was moderate stenosis. There was no regurgitation.    VTI ratio of LVOT to aortic valve: 0.34. Indexed valve area (VTI): 0.52 cm^2/m^2. Peak velocity ratio of LVOT to aortic valve: 0.27. Indexed valve area (Vmax): 0.43 cm^2/m^2. Mean velocity ratio of LVOT to aortic valve: 0.27. Indexed valve area (Vmean): 0.42 cm^2/m^2.    Mean gradient (S): 30 mm Hg. Peak gradient (S): 49 mm Hg.  CATH: 1.  Significant underlying three-vessel coronary artery disease with patent LIMA to LAD (could not be selectively engaged), patent SVG to RCA and known occluded SVG to OM 3.  Occluded native left circumflex. 2.  Moderate to severe aortic stenosis on current hemodynamics with a mean gradient of 26 mmHg and valve area of 1.2.  However, his aortic stenosis is likely severe based on echocardiogram findings.  Aortic valve area today is overestimated due to high cardiac output.  Valve area by echocardiogram was 0.6.  Valve morphology is also consistent with severe stenosis. 3.  Right heart  catheterization showed mildly elevated filling pressures with mild pulmonary hypertension.   Neuro/Psych negative neurological ROS  negative psych ROS   GI/Hepatic negative GI ROS, Neg liver ROS,   Endo/Other  negative endocrine ROS  Renal/GU negative Renal ROS     Musculoskeletal negative musculoskeletal ROS (+)   Abdominal (+) + obese,   Peds  Hematology HLD    Anesthesia Other Findings Severe Aortic Stenosis  Reproductive/Obstetrics                            Anesthesia Physical Anesthesia Plan  ASA: IV  Anesthesia Plan: MAC   Post-op Pain Management:    Induction: Intravenous  PONV Risk Score and Plan: 1 and Treatment may vary due to age or medical condition  Airway Management Planned: Simple Face Mask  Additional Equipment: Arterial line, CVP and Ultrasound Guidance Line Placement  Intra-op Plan:   Post-operative Plan:   Informed Consent: I have reviewed the patients History and Physical, chart, labs and discussed the procedure including the risks, benefits and alternatives for the proposed anesthesia with the patient or authorized representative who has indicated his/her understanding and acceptance.   Dental advisory given  Plan Discussed with: CRNA  Anesthesia Plan Comments:         Anesthesia Quick Evaluation

## 2018-03-13 NOTE — Progress Notes (Signed)
Patient interviewed in the preop area with assistance from spanish interpreter Randol Kern. Patient able to confirm name, DOB, procedure, NKDA, no metal in body and no pain at this time.  Yvetta Coder, RN

## 2018-03-13 NOTE — Progress Notes (Signed)
6 french sheath removed from left femoral artery and pressure held x 20 minutes.  Site looks good with no hematoma level 0.  Vitals stable throughout sheath removal and hold.  Site dressed with 4x4 and tegaderm.  Distal pulses palpable.  RN will continue to monitor site during bedrest.

## 2018-03-14 ENCOUNTER — Encounter (HOSPITAL_COMMUNITY): Payer: Self-pay | Admitting: Cardiovascular Disease

## 2018-03-14 ENCOUNTER — Other Ambulatory Visit: Payer: Self-pay

## 2018-03-14 ENCOUNTER — Other Ambulatory Visit: Payer: Self-pay | Admitting: Physician Assistant

## 2018-03-14 ENCOUNTER — Inpatient Hospital Stay (HOSPITAL_COMMUNITY): Payer: BLUE CROSS/BLUE SHIELD

## 2018-03-14 DIAGNOSIS — Z952 Presence of prosthetic heart valve: Secondary | ICD-10-CM

## 2018-03-14 DIAGNOSIS — I350 Nonrheumatic aortic (valve) stenosis: Secondary | ICD-10-CM

## 2018-03-14 DIAGNOSIS — I5030 Unspecified diastolic (congestive) heart failure: Secondary | ICD-10-CM

## 2018-03-14 LAB — CBC
HEMATOCRIT: 42.1 % (ref 39.0–52.0)
Hemoglobin: 13.8 g/dL (ref 13.0–17.0)
MCH: 28.8 pg (ref 26.0–34.0)
MCHC: 32.8 g/dL (ref 30.0–36.0)
MCV: 87.7 fL (ref 80.0–100.0)
Platelets: 135 10*3/uL — ABNORMAL LOW (ref 150–400)
RBC: 4.8 MIL/uL (ref 4.22–5.81)
RDW: 12.5 % (ref 11.5–15.5)
WBC: 12.3 10*3/uL — AB (ref 4.0–10.5)
nRBC: 0 % (ref 0.0–0.2)

## 2018-03-14 LAB — ECHOCARDIOGRAM LIMITED
Height: 65 in
Weight: 3202.84 oz

## 2018-03-14 LAB — BASIC METABOLIC PANEL
Anion gap: 8 (ref 5–15)
BUN: 8 mg/dL (ref 8–23)
CALCIUM: 8.3 mg/dL — AB (ref 8.9–10.3)
CO2: 25 mmol/L (ref 22–32)
CREATININE: 0.84 mg/dL (ref 0.61–1.24)
Chloride: 102 mmol/L (ref 98–111)
GFR calc Af Amer: >60 mL/min (ref >60)
GFR calc non Af Amer: >60 mL/min (ref >60)
GLUCOSE: 123 mg/dL — AB (ref 70–99)
Potassium: 3.1 mmol/L — ABNORMAL LOW (ref 3.5–5.1)
Sodium: 135 mmol/L (ref 135–145)

## 2018-03-14 LAB — MAGNESIUM: Magnesium: 1.7 mg/dL (ref 1.7–2.4)

## 2018-03-14 MED ORDER — POTASSIUM CHLORIDE CRYS ER 10 MEQ PO TBCR
10.0000 meq | EXTENDED_RELEASE_TABLET | Freq: Every day | ORAL | Status: DC
  Administered 2018-03-14 – 2018-03-15 (×2): 10 meq via ORAL
  Filled 2018-03-14 (×4): qty 1

## 2018-03-14 MED ORDER — PERFLUTREN LIPID MICROSPHERE
1.0000 mL | INTRAVENOUS | Status: DC | PRN
  Administered 2018-03-14: 3 mL via INTRAVENOUS
  Filled 2018-03-14: qty 10

## 2018-03-14 MED ORDER — POTASSIUM CHLORIDE CRYS ER 20 MEQ PO TBCR
60.0000 meq | EXTENDED_RELEASE_TABLET | Freq: Once | ORAL | Status: AC
  Administered 2018-03-14: 60 meq via ORAL
  Filled 2018-03-14: qty 3

## 2018-03-14 MED ORDER — METOPROLOL TARTRATE 50 MG PO TABS
50.0000 mg | ORAL_TABLET | Freq: Two times a day (BID) | ORAL | Status: DC
  Administered 2018-03-14 – 2018-03-15 (×3): 50 mg via ORAL
  Filled 2018-03-14 (×3): qty 1

## 2018-03-14 NOTE — Progress Notes (Addendum)
[facility]   MULTIDISCIPLINARY HEART VALVE TEAM  Patient Name: James Yates Date of Encounter: 03/14/2018  Primary Cardiologist: Dr. Kirke Corin / Dr. Mike Craze & Dr. Excell Seltzer (TAVR)  Hospital Problem List     Principal Problem:   S/P TAVR (transcatheter aortic valve replacement) Active Problems:   S/P CABG x 4   Coronary artery disease   Hyperlipidemia   Hypertension   Severe aortic stenosis   Obesity   Glucose intolerance (impaired glucose tolerance)     Subjective   Feeling well. No complaints.   Inpatient Medications    Scheduled Meds: . aspirin EC  81 mg Oral Daily  . atorvastatin  80 mg Oral q1800  . clopidogrel  75 mg Oral Q breakfast  . metoprolol tartrate  50 mg Oral BID  . potassium chloride  10 mEq Oral Daily  . sodium chloride flush  3 mL Intravenous Q12H   Continuous Infusions: . sodium chloride Stopped (03/14/18 0900)  . cefUROXime (ZINACEF)  IV Stopped (03/14/18 0441)  . nitroGLYCERIN Stopped (03/13/18 1245)  . phenylephrine (NEO-SYNEPHRINE) Adult infusion Stopped (03/13/18 1744)   PRN Meds: sodium chloride, acetaminophen **OR** acetaminophen, metoprolol tartrate, morphine injection, ondansetron (ZOFRAN) IV, oxyCODONE, sodium chloride flush, traMADol   Vital Signs    Vitals:   03/14/18 0800 03/14/18 0853 03/14/18 0900 03/14/18 1000  BP: 123/79  126/77 108/65  Pulse: (!) 103  (!) 105 (!) 107  Resp: 13  13 19   Temp:  98.8 F (37.1 C)    TempSrc:  Oral    SpO2: 96%  98% 96%  Weight:      Height:        Intake/Output Summary (Last 24 hours) at 03/14/2018 1152 Last data filed at 03/14/2018 0900 Gross per 24 hour  Intake 1086.26 ml  Output 2450 ml  Net -1363.74 ml   Filed Weights   03/13/18 0815 03/14/18 0417  Weight: 88.6 kg 90.8 kg    Physical Exam   GEN: Well nourished, well developed, in no acute distress. overweight HEENT: Grossly normal.  Neck: Supple, no JVD, carotid bruits, or masses. Cardiac: RRR, soft flow  murmur. No rubs, or gallops. No clubbing, cyanosis, edema.  Radials/DP/PT 2+ and equal bilaterally.  Respiratory:  Respirations regular and unlabored, clear to auscultation bilaterally. GI: Soft, nontender, nondistended, BS + x 4. MS: no deformity or atrophy. Skin: warm and dry, no rash.  Groin site are clear without hematoma or ecchymosis Neuro:  Strength and sensation are intact. Psych: AAOx3.  Normal affect.  Labs    CBC Recent Labs    03/13/18 1222 03/14/18 0438  WBC  --  12.3*  HGB 14.3 13.8  HCT 42.0 42.1  MCV  --  87.7  PLT  --  135*   Basic Metabolic Panel Recent Labs    16/10/96 1131 03/13/18 1222 03/14/18 0438  NA 141 141 135  K 3.9 3.9 3.1*  CL 104  --  102  CO2  --   --  25  GLUCOSE 147* 138* 123*  BUN 9  --  8  CREATININE 0.70  --  0.84  CALCIUM  --   --  8.3*  MG  --   --  1.7   Liver Function Tests No results for input(s): AST, ALT, ALKPHOS, BILITOT, PROT, ALBUMIN in the last 72 hours. No results for input(s): LIPASE, AMYLASE in the last 72 hours. Cardiac Enzymes No results for input(s): CKTOTAL, CKMB, CKMBINDEX, TROPONINI in the last 72 hours. BNP Invalid  input(s): POCBNP D-Dimer No results for input(s): DDIMER in the last 72 hours. Hemoglobin A1C No results for input(s): HGBA1C in the last 72 hours. Fasting Lipid Panel No results for input(s): CHOL, HDL, LDLCALC, TRIG, CHOLHDL, LDLDIRECT in the last 72 hours. Thyroid Function Tests No results for input(s): TSH, T4TOTAL, T3FREE, THYROIDAB in the last 72 hours.  Invalid input(s): FREET3  Telemetry    Sinus tach HR 100-120s- Personally Reviewed  ECG    Sinus tach HR 101, RBBB - Personally Reviewed  Radiology    Dg Chest Port 1 View  Result Date: 03/13/2018 CLINICAL DATA:  Status post transcatheter aortic valve replacement. EXAM: PORTABLE CHEST 1 VIEW COMPARISON:  Radiographs of March 09, 2018. FINDINGS: Stable cardiomediastinal silhouette. Status post coronary artery bypass graft.  Interval placement of right internal jugular catheter with distal tip in expected position of cavoatrial junction. No pneumothorax or significant pleural effusion is noted. Hypoinflation of the lungs is noted with mild bibasilar subsegmental atelectasis. Bony thorax is unremarkable. IMPRESSION: Hypoinflation of the lungs with mild bibasilar subsegmental atelectasis. Electronically Signed   By: Lupita Raider, M.D.   On: 03/13/2018 13:03    Cardiac Studies   TAVR OPERATIVE NOTE   Date of Procedure:                03/13/2018  Preoperative Diagnosis:      Severe Aortic Stenosis  Procedure:        Transcatheter Aortic Valve Replacement - Percutaneous Right Transfemoral Approach             Edwards Sapien 3 THV (size 26 mm, model # [id number], serial # [id number])              Co-Surgeons:                        [name], MD and Salvatore Decent. Cornelius Moras, MD   Pre-operative Echo Findings: ? Severe aortic stenosis ? Normal left ventricular systolic function  Post-operative Echo Findings: ? No paravalvular leak ? Normal left ventricular systolic function _____________  Echo 03/14/18 Study Conclusions - Left ventricle: The cavity size was normal. Systolic function was   normal. The estimated ejection fraction was in the range of 55%   to 60%. Wall motion was normal; there were no regional wall   motion abnormalities. Doppler parameters are consistent with   abnormal left ventricular relaxation (grade 1 diastolic   dysfunction). - Aortic valve: A 26mm Edwards Sapien 3 TAVR bioprosthesis was   present and well-seated. Transvalvular velocity was within the   normal range. There was no stenosis. There was no regurgitation.   Peak velocity (S): 248 cm/s. Mean gradient (S): 12 mm Hg. Valve   area (VTI): 1.84 cm^2. Valve area (Vmean): 1.51 cm^2. - Mitral valve: There was no regurgitation. - Right ventricle: The cavity size was normal. Wall thickness was   normal. Systolic function  was normal. - Tricuspid valve: There was no regurgitation.    Patient Profile     James Yates is a 62 y.o. male with a history of CAD s/p CABG (2015), HTN, HLD, obesity and severe aortic stenosis who presented to [facility] on 03/13/18 for planned TAVR.  Assessment & Plan    Severe AS: s/p successful TAVR with a 26 mm Edwards Sapien 3 THV via the TF approach on 03/13/18. Post operative echo showed EF 55%, normally functioning TAVR with mean gradient of 12mm Hg and no PVL. Groin sites are stable. Temp  wire has been pulled. ECG with old RBBB and no high grade heart block. Continue on ASA and plavix. If he is able to urinate, plan for discharge home later today.   Sinus tachycardia: HR is 120s just laying in bed. It appears to be sinus. Will resume home metoprolol 50mg  BID now and follow.  HTN: BP on soft side. Continue to hold home amlodipine  Acute urinary retention: foley placed last night. His RN will remove this and if he is able to void on his own we will let him go home.   Hypokalemia: this has been supplemented. I will also resume his home K  CAD: pre TAVR cath showed patent LIMA--> LAD, SVG--> RCA and known occlusion of the SVG--> OM branch. OM fills from R--> L collaterals. Continue medical therapy.   SignedCline Crock, PA-C  03/14/2018, 11:52 AM  Pager [phone]  I have personally seen and examined this patient. I agree with the assessment and plan as outlined above.  He is doing well one day post TAVR. Groins stable. Temp wire removed today. No heart block post TAVR. Echo with no PVL. Some dizziness. Will monitor today and plan d/c home tomorrow if stable.    [name] 03/14/2018 12:59 PM

## 2018-03-14 NOTE — Progress Notes (Signed)
Report called and patient moved to 4E24. Wife at bedside. Patient made aware of plan of care and verbalized understanding.  Patient due to void, urinary catheter removed at 1130 AM.

## 2018-03-14 NOTE — Progress Notes (Signed)
CARDIAC REHAB PHASE I   PRE:  Rate/Rhythm: 82 SR    BP: sitting 123/87    SaO2: 97 RA  MODE:  Ambulation: 120 ft   POST:  Rate/Rhythm: 83 SR    BP: sitting 127/82     SaO2: 97 RA  Pt diaphoretic and c/o HA on my arrival. RN had recently given him pain med for HA. Pt stood independently but c/o right groin pain and unsteady at times. Used RW and gait belt. Pt c/o dizziness with standing and walking. After 60 ft he felt like he might vomit. Walked back to room and to recliner. VSS. RN gave nausea meds. He is weak, think he could benefit from staying overnight to walk more, etc. We did discuss walking at home and CRPII. Will refer to  CRPII.  [id number]   Harriet Masson CES, ACSM 03/14/2018 2:08 PM

## 2018-03-14 NOTE — Progress Notes (Signed)
  Echocardiogram 2D Echocardiogram limited with definity has been performed.  Leta Jungling M 03/14/2018, 8:13 AM

## 2018-03-15 LAB — CBC
HEMATOCRIT: 38 % — AB (ref 39.0–52.0)
HEMOGLOBIN: 12.8 g/dL — AB (ref 13.0–17.0)
MCH: 29.3 pg (ref 26.0–34.0)
MCHC: 33.7 g/dL (ref 30.0–36.0)
MCV: 87 fL (ref 80.0–100.0)
Platelets: 112 10*3/uL — ABNORMAL LOW (ref 150–400)
RBC: 4.37 MIL/uL (ref 4.22–5.81)
RDW: 12.8 % (ref 11.5–15.5)
WBC: 8.5 10*3/uL (ref 4.0–10.5)
nRBC: 0 % (ref 0.0–0.2)

## 2018-03-15 LAB — BASIC METABOLIC PANEL
Anion gap: 4 — ABNORMAL LOW (ref 5–15)
BUN: 7 mg/dL — AB (ref 8–23)
CHLORIDE: 107 mmol/L (ref 98–111)
CO2: 26 mmol/L (ref 22–32)
CREATININE: 0.84 mg/dL (ref 0.61–1.24)
Calcium: 7.9 mg/dL — ABNORMAL LOW (ref 8.9–10.3)
GFR calc Af Amer: >60 mL/min (ref >60)
GFR calc non Af Amer: >60 mL/min (ref >60)
Glucose, Bld: 101 mg/dL — ABNORMAL HIGH (ref 70–99)
Potassium: 3.4 mmol/L — ABNORMAL LOW (ref 3.5–5.1)
Sodium: 137 mmol/L (ref 135–145)

## 2018-03-15 MED ORDER — POTASSIUM CHLORIDE ER 10 MEQ PO TBCR: 10.0000 meq | EXTENDED_RELEASE_TABLET | Freq: Every day | ORAL | 3 refills | Status: DC

## 2018-03-15 MED ORDER — CLOPIDOGREL BISULFATE 75 MG PO TABS: 75.0000 mg | ORAL_TABLET | Freq: Every day | ORAL | 1 refills | Status: DC

## 2018-03-15 MED FILL — Heparin Sodium (Porcine) Inj 1000 Unit/ML: INTRAMUSCULAR | Qty: 30 | Status: AC

## 2018-03-15 MED FILL — Magnesium Sulfate Inj 50%: INTRAMUSCULAR | Qty: 10 | Status: AC

## 2018-03-15 MED FILL — Potassium Chloride Inj 2 mEq/ML: INTRAVENOUS | Qty: 40 | Status: AC

## 2018-03-15 NOTE — Discharge Summary (Addendum)
HEART AND VASCULAR CENTER   MULTIDISCIPLINARY HEART VALVE TEAM  Discharge Summary    Patient ID: James Yates MRN: [id number]; DOB: February 23, 1956  Admit date: 03/13/2018 Discharge date: 03/15/2018  Primary Care Provider: Glori Luis, MD  Primary Cardiologist: Dr. Kirke Corin / Dr. Mike Craze & Dr. Excell Seltzer (TAVR)  Discharge Diagnoses    Principal Problem:   S/P TAVR (transcatheter aortic valve replacement) Active Problems:   S/P CABG x 4   Coronary artery disease   Hyperlipidemia   Hypertension   Severe aortic stenosis   Obesity   Glucose intolerance (impaired glucose tolerance)   Allergies No Known Allergies  Diagnostic Studies/Procedures    TAVR OPERATIVE NOTE   Date of Procedure:03/13/2018  Preoperative Diagnosis:Severe Aortic Stenosis  Procedure:   Transcatheter Aortic Valve Replacement - PercutaneousRightTransfemoral Approach Edwards Sapien 3 THV (size 26mm, model # 9600TFX, serial # [id number])  Co-Surgeons:Maanasa Aderhold Clifton James, MD andClarence H. Cornelius Moras, MD   Pre-operative Echo Findings: ? Severe aortic stenosis ? Normalleft ventricular systolic function  Post-operative Echo Findings: ? Noparavalvular leak ? Normalleft ventricular systolic function _____________  Echo 03/14/18 Study Conclusions - Left ventricle: The cavity size was normal. Systolic function was normal. The estimated ejection fraction was in the range of 55% to 60%. Wall motion was normal; there were no regional wall motion abnormalities. Doppler parameters are consistent with abnormal left ventricular relaxation (grade 1 diastolic dysfunction). - Aortic valve: A 26mm Edwards Sapien 3 TAVR bioprosthesis was present and well-seated. Transvalvular velocity was within the normal range. There was no stenosis. There was no regurgitation. Peak velocity (S): 248 cm/s. Mean gradient (S): 12 mm Hg.  Valve area (VTI): 1.84 cm^2. Valve area (Vmean): 1.51 cm^2. - Mitral valve: There was no regurgitation. - Right ventricle: The cavity size was normal. Wall thickness was normal. Systolic function was normal. - Tricuspid valve: There was no regurgitation.   History of Present Illness     James Yates is a 62 y.o. male with a history of CAD s/p CABG (2015), HTN, HLD, obesity and severe aortic stenosis who presented to [facility] on [facility]/13/18 for planned TAVR.  He is known to have CAD and underwent CABG in February 2015. At the time of his CABG he had mild aortic stenosis. Cardiac cath July 2019 with patent LIMA to LAD and patent SVG to RCA with known occlusion of the SVG to the obtuse marginal branch. The obtuse marginal fills from right to left collaterals. Echo May 2019 with normal LV systolic function, LVEF 60-65%. 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 The mean gradient across the valve is 38 mmHg and the peak gradient is 71 mmHg. DVI 0.24. AVA 0.6 cm2. This is consistent with severe aortic valve stenosis. He has been seen by Dr. Donata Clay and is felt to be a better candidate for TAVR than redo open surgical procedure.   He was evaluated by the multidisciplinary valve team who felt he was a suitable candidate for TAVR, which was set up for 03/13/2018.  Hospital Course     Consultants: none  Severe AS:s/p successful TAVR with a 26 mm Edwards Sapien 3 THV via the TF approach on 03/13/18. Post operative echo showed EF 55%, normally functioning TAVR with mean gradient of 12mm Hg and no PVL. Groin sites are stable. Temp wire was pulled after overnight observation. ECG with old RBBB and no high grade heart block. Continue on ASA and plavix.  The patient had some dizziness and nausea yesterday and he was  kept for overnight observation.  This has resolved and he is feeling better. Plan for discharge home today with 1 week follow-up in the office.  Sinus  tachycardia: This has improved with resumption of metoprolol 50 mg twice daily.  HTN: BP well controlled.  I will hold home amlodipine 5 mg at discharge and will add back in the office next week if blood pressure is elevated.  Acute urinary retention: Foley was placed but now has been removed.  This has resolved.  Hypokalemia: this has been supplemented.  I have resumed his home potassium supplementation.  CAD: pre TAVR cath showed patent LIMA--> LAD, SVG--> RCA and known occlusion of the SVG--> OM branch. OM fills from R--> L collaterals. Continue medical therapy.  _____________  Discharge Vitals Blood pressure 131/77, pulse 77, temperature 98.9 F (37.2 C), temperature source Oral, resp. rate 20, height 5\' 5"  (1.651 m), weight 89.7 kg, SpO2 96 %.  Filed Weights   03/13/18 0815 03/14/18 0417 03/15/18 0409  Weight: 88.6 kg 90.8 kg 89.7 kg   VS:  BP 131/77 (BP Location: Right Arm)   Pulse 77   Temp 98.9 F (37.2 C) (Oral)   Resp 20   Ht 5\' 5"  (1.651 m)   Wt 89.7 kg   SpO2 96%   BMI 32.92 kg/m    GEN: Well nourished, well developed, in no acute distress HEENT: normal Neck: no JVD or masses Cardiac: RRR; 2/6 SEM. No rubs, or gallops,no edema  Respiratory:  clear to auscultation bilaterally, normal work of breathing GI: soft, nontender, nondistended, + BS MS: no deformity or atrophy Skin: warm and dry, no rash Neuro:  Alert and Oriented x 3, Strength and sensation are intact Psych: euthymic mood, full affect    Labs & Radiologic Studies    CBC Recent Labs    03/14/18 0438 03/15/18 0354  WBC 12.3* 8.5  HGB 13.8 12.8*  HCT 42.1 38.0*  MCV 87.7 87.0  PLT 135* 112*   Basic Metabolic Panel Recent Labs    62/95/28 0438 03/15/18 0354  NA 135 137  K 3.1* 3.4*  CL 102 107  CO2 25 26  GLUCOSE 123* 101*  BUN 8 7*  CREATININE 0.84 0.84  CALCIUM 8.3* 7.9*  MG 1.7  --    Liver Function Tests No results for input(s): AST, ALT, ALKPHOS, BILITOT, PROT, ALBUMIN in  the last 72 hours. No results for input(s): LIPASE, AMYLASE in the last 72 hours. Cardiac Enzymes No results for input(s): CKTOTAL, CKMB, CKMBINDEX, TROPONINI in the last 72 hours. BNP Invalid input(s): POCBNP D-Dimer No results for input(s): DDIMER in the last 72 hours. Hemoglobin A1C No results for input(s): HGBA1C in the last 72 hours. Fasting Lipid Panel No results for input(s): CHOL, HDL, LDLCALC, TRIG, CHOLHDL, LDLDIRECT in the last 72 hours. Thyroid Function Tests No results for input(s): TSH, T4TOTAL, T3FREE, THYROIDAB in the last 72 hours.  Invalid input(s): FREET3 _____________  Dg Chest 2 View  Result Date: 03/09/2018 CLINICAL DATA:  Preop exam; TAVR scheduled for 03/13/18 Hx of CAD, HTN, aortic stenosis. EXAM: CHEST - 2 VIEW COMPARISON:  CT of the chest on 12/14/2017 FINDINGS: Status post median sternotomy and CABG. The heart is normal in size. Aorta is tortuous and focally calcified. There is subsegmental atelectasis versus scarring in the lingula. IMPRESSION: No evidence for acute  abnormality. Electronically Signed   By: Norva Pavlov M.D.   On: 03/09/2018 14:53   Dg Chest Port 1 View  Result Date: 03/13/2018  CLINICAL DATA:  Status post transcatheter aortic valve replacement. EXAM: PORTABLE CHEST 1 VIEW COMPARISON:  Radiographs of March 09, 2018. FINDINGS: Stable cardiomediastinal silhouette. Status post coronary artery bypass graft. Interval placement of right internal jugular catheter with distal tip in expected position of cavoatrial junction. No pneumothorax or significant pleural effusion is noted. Hypoinflation of the lungs is noted with mild bibasilar subsegmental atelectasis. Bony thorax is unremarkable. IMPRESSION: Hypoinflation of the lungs with mild bibasilar subsegmental atelectasis. Electronically Signed   By: Lupita Raider, M.D.   On: 03/13/2018 13:03   Disposition   Pt is being discharged home today in good condition.  Follow-up Plans & Appointments     Follow-up Information    Janetta Hora, PA-C. Go on 03/21/2018.   Specialties:  Cardiology, Radiology Why:  @ 3:30pm Contact information: [address] [place] [address] [phone]          Discharge Instructions    Amb Referral to Cardiac Rehabilitation   Complete by:  As directed    Diagnosis:  Valve Replacement   Valve:  Aortic Comment - TAVR   Diet - low sodium heart healthy   Complete by:  As directed    Increase activity slowly   Complete by:  As directed       Discharge Medications   Allergies as of 03/15/2018   No Known Allergies     Medication List    STOP taking these medications   amLODipine 5 MG tablet Commonly known as:  NORVASC     TAKE these medications   aspirin EC 81 MG tablet Take 1 tablet (81 mg total) by mouth daily.   atorvastatin 80 MG tablet Commonly known as:  LIPITOR TAKE 1 TABLET (80 MG TOTAL) BY MOUTH DAILY AT 6 PM. What changed:  See the new instructions.   clopidogrel 75 MG tablet Commonly known as:  PLAVIX Take 1 tablet (75 mg total) by mouth daily with breakfast. Start taking on:  03/16/2018   metoprolol tartrate 50 MG tablet Commonly known as:  LOPRESSOR Take 1 tablet (50 mg total) by mouth 2 (two) times daily.   potassium chloride 10 MEQ tablet Commonly known as:  K-DUR Take 1 tablet (10 mEq total) by mouth daily. What changed:    how much to take  how to take this  when to take this  additional instructions         Outstanding Labs/Studies   none  Duration of Discharge Encounter   Greater than 30 minutes including physician time.  Signed, Cline Crock, PA-C 03/15/2018, 10:31 AM [phone]  I have personally seen and examined this patient. I agree with the assessment and plan as outlined above.  He is doing well today. Echo yesterday showed no PVL, mildly elevated gradient across the AV. BP stable. Rhythm is sinus with no heart block. Labs reviewed. Discharge  home today.   [name] 03/15/2018 10:32 AM

## 2018-03-15 NOTE — Progress Notes (Signed)
CARDIAC REHAB PHASE I   PRE:  Rate/Rhythm: 79 SR RBBB    BP: sitting 123/80    SaO2: 98 RA  MODE:  Ambulation: 430 ft   POST:  Rate/Rhythm: 92 SR    BP: sitting 133/82     SaO2: 97 RA  Pt feeling much better today. Able to stand and walk with just standby assist. No dizziness or pain. He did st he was fatigued toward end but increased distance substantially. Reviewed ex gl and restrictions. Voiced understanding. [phone]   Harriet Masson CES, ACSM 03/15/2018 10:29 AM

## 2018-03-15 NOTE — Discharge Instructions (Signed)

## 2018-03-15 NOTE — Progress Notes (Signed)
IV and telemetry discontinued at this time. CCMD notified. Discharge instructions reviewed with patient. All questions answered.   K. Starr Eldrick Penick, RN 

## 2018-03-16 ENCOUNTER — Telehealth: Payer: Self-pay | Admitting: Physician Assistant

## 2018-03-16 NOTE — Telephone Encounter (Signed)
  HEART AND VASCULAR CENTER   MULTIDISCIPLINARY HEART VALVE TEAM   Patient contacted regarding discharge from [facility] on 10/17  Patient understands to follow up with provider Carlean Jews on 10/24 @ 2:30pm at [address].  Patient understands discharge instructions? yes Patient understands medications and regiment? yes Patient understands to bring all medications to this visit? yes  Cline Crock PA-C  [facility]

## 2018-03-19 ENCOUNTER — Other Ambulatory Visit: Payer: Self-pay | Admitting: Cardiovascular Disease

## 2018-03-21 ENCOUNTER — Ambulatory Visit: Payer: BLUE CROSS/BLUE SHIELD | Admitting: Physician Assistant

## 2018-03-21 ENCOUNTER — Other Ambulatory Visit: Payer: Self-pay | Admitting: Cardiovascular Disease

## 2018-03-21 NOTE — Progress Notes (Signed)
HEART AND VASCULAR CENTER   MULTIDISCIPLINARY HEART VALVE CLINIC                                       Cardiology Office Note    Date:  03/22/2018   ID:  James Yates, DOB 02/24/1956, MRN [id number]  PCP:  James Luis, MD  Cardiologist: Dr. Kirke Corin / Dr. Mike Craze & Dr. Cornelius Moras (TAVR)  CC: [facility] s/p TAVR  History of Present Illness:  James Yates is a 62 y.o. male with a history of CAD s/p CABG (2015), RBBB, HTN, HLD, obesity and severe aortic stenosis who presented to [facility] on 03/13/18 for planned TAVR.  He is known to have CAD and underwent CABG in February 2015. At the time of his CABG he had mild aortic stenosis. Cardiac cath July 2019 with patent LIMA to LAD and patent SVG to RCA with known occlusion of the SVG to the obtuse marginal branch. The obtuse marginal fills from right to left collaterals. Echo May 2019 with normal LV systolic function, LVEF 60-65%. 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 The mean gradient across the valve is 38 mmHg and the peak gradient is 71 mmHg. DVI 0.24. AVA 0.6 cm2. This is consistent with severe aortic valve stenosis. He has been seen by Dr. Donata Clay and is felt to be a better candidate for TAVR than redo open surgical procedure.   He was evaluated by the multidisciplinary valve team and underwent successful TAVR with a12mm Edwards Sapien 3 THV via the TF approach on 03/13/18. Post operative echoshowed EF 55%, normally functioning TAVR with mean gradient of 12mm Hg and no PVL. Post op recovery was complicated by acute urinary retention, sinus tach, and dizziness, which resolved. He was discharged on Aspirin and plavix. Home amlodipine 5mg  daily was held at discharge.   Today he presents to clinic for follow up. No CP or SOB. No LE edema, orthopnea or PND. No dizziness or syncope. No blood in stool or urine. No palpitations. He periodically gets some back and shoulder pain that is better with different  positions and headaches for which he is taking advil. Breathing is much better than prior to TAVR. Still gets some mild dyspnea with moderate exertion.   Past Medical History:  Diagnosis Date  . Coronary artery disease    s/p CABG  . Glucose intolerance (impaired glucose tolerance)   . Hyperlipidemia   . Hypertension   . Obesity   . S/P TAVR (transcatheter aortic valve replacement)    26 mm Edwards Sapien 3 transcatheter heart valve placed via percutaneous right transfemoral approach   . Severe aortic stenosis     Past Surgical History:  Procedure Laterality Date  . CARDIAC CATHETERIZATION  06/2013   [facility]  . CARDIAC CATHETERIZATION N/A 10/29/2015   Procedure: Left Heart Cath and Cors/Grafts Angiography;  Surgeon: Iran Ouch, MD;  Location: [facility] INVASIVE CV LAB;  Service: Cardiovascular;  Laterality: N/A;  . CORONARY ARTERY BYPASS GRAFT N/A 07/05/2013   Procedure: CORONARY ARTERY BYPASS GRAFTING (CABG);  Surgeon: [name], MD;  Location: [facility] OR;  Service: Open Heart Surgery;  Laterality: N/A;  . INTRAOPERATIVE TRANSESOPHAGEAL ECHOCARDIOGRAM N/A 07/05/2013   Procedure: INTRAOPERATIVE TRANSESOPHAGEAL ECHOCARDIOGRAM;  Surgeon: [name], MD;  Location[facility] OR;  Service: Open Heart Surgery;  Laterality: N/A;  . INTRAOPERATIVE TRANSTHORACIC ECHOCARDIOGRAM N/A 03/13/2018  Procedure: INTRAOPERATIVE TRANSTHORACIC ECHOCARDIOGRAM;  Surgeon: Kathleene Hazel, MD;  Location: [facility] OR;  Service: Open Heart Surgery;  Laterality: N/A;  . NO PAST SURGERIES    . RIGHT/LEFT HEART CATH AND CORONARY ANGIOGRAPHY Bilateral 11/27/2017   Procedure: RIGHT/LEFT HEART CATH AND CORONARY ANGIOGRAPHY;  Surgeon: Iran Ouch, MD;  Location: [facility] INVASIVE CV LAB;  Service: Cardiovascular;  Laterality: Bilateral;  . TRANSCATHETER AORTIC VALVE REPLACEMENT, TRANSFEMORAL N/A 03/13/2018   Procedure: TRANSCATHETER AORTIC VALVE REPLACEMENT, TRANSFEMORAL. Edwards Sapien 3 Transcatheter Heart Valve size  26mm.;  Surgeon: Kathleene Hazel, MD;  Location: [facility] OR;  Service: Open Heart Surgery;  Laterality: N/A;    Current Medications: Outpatient Medications Prior to Visit  Medication Sig Dispense Refill  . amLODipine (NORVASC) 5 MG tablet TAKE 1 TABLET BY MOUTH EVERY DAY 90 tablet 0  . aspirin EC 81 MG tablet Take 1 tablet (81 mg total) by mouth daily. 30 tablet 3  . atorvastatin (LIPITOR) 80 MG tablet TAKE 1 TABLET (80 MG TOTAL) BY MOUTH DAILY AT 6 PM. (Patient taking differently: Take 80 mg by mouth daily at 6 PM. TAKE 1 TABLET (80 MG TOTAL) BY MOUTH DAILY AT 6 PM.) 90 tablet 3  . clopidogrel (PLAVIX) 75 MG tablet Take 1 tablet (75 mg total) by mouth daily with breakfast. 90 tablet 1  . metoprolol tartrate (LOPRESSOR) 50 MG tablet TAKE 1 TABLET BY MOUTH TWICE A DAY 180 tablet 1  . potassium chloride (K-DUR) 10 MEQ tablet Take 1 tablet (10 mEq total) by mouth daily. 30 tablet 3   No facility-administered medications prior to visit.      Allergies:   Patient has no known allergies.   Social History   Socioeconomic History  . Marital status: Married    Spouse name: Not on file  . Number of children: Not on file  . Years of education: Not on file  . Highest education level: Not on file  Occupational History  . Not on file  Social Needs  . Financial resource strain: Not on file  . Food insecurity:    Worry: Not on file    Inability: Not on file  . Transportation needs:    Medical: Not on file    Non-medical: Not on file  Tobacco Use  . Smoking status: Never Smoker  . Smokeless tobacco: Never Used  Substance and Sexual Activity  . Alcohol use: No  . Drug use: No  . Sexual activity: Not on file  Lifestyle  . Physical activity:    Days per week: Not on file    Minutes per session: Not on file  . Stress: Not on file  Relationships  . Social connections:    Talks on phone: Not on file    Gets together: Not on file    Attends religious service: Not on file    Active  member of club or organization: Not on file    Attends meetings of clubs or organizations: Not on file    Relationship status: Not on file  Other Topics Concern  . Not on file  Social History Narrative  . Not on file     Family History:  The patient's family history includes Heart attack in his father; Heart disease in his father.    ROS:   Please see the history of present illness.    ROS All other systems reviewed and are negative.   PHYSICAL EXAM:   VS:  BP 124/80   Pulse 77  Ht 5\' 5"  (1.651 m)   Wt 195 lb (88.5 kg)   BMI 32.45 kg/m    GEN: Well nourished, well developed, in no acute distress HEENT: normal Neck: no JVD or masses Cardiac: RRR; 1/6 systolic murmur. No rubs, or gallops,no edema  Respiratory:  clear to auscultation bilaterally, normal work of breathing GI: soft, nontender, nondistended, + BS MS: no deformity or atrophy Skin: warm and dry, no rash. Mild hematoma with ecchymosis bilaterally. Left side slightly tender Neuro:  Alert and Oriented x 3, Strength and sensation are intact Psych: euthymic mood, full affect   Wt Readings from Last 3 Encounters:  03/22/18 195 lb (88.5 kg)  03/15/18 197 lb 12.8 oz (89.7 kg)  03/09/18 195 lb 4.8 oz (88.6 kg)      Studies/Labs Reviewed:   EKG:  EKG is ordered today.  The ekg ordered today demonstrates NSR, RBBB HR 77  Recent Labs: 03/09/2018: ALT 22; B Natriuretic Peptide 56.2 03/14/2018: Magnesium 1.7 03/15/2018: BUN 7; Creatinine, Ser 0.84; Hemoglobin 12.8; Platelets 112; Potassium 3.4; Sodium 137   Lipid Panel    Component Value Date/Time   CHOL 130 01/08/2018 1552   CHOL 159 09/21/2015 1457   CHOL 215 (H) 07/02/2013 0058   TRIG 127 01/08/2018 1552   TRIG 143 07/02/2013 0058   HDL 39 (L) 01/08/2018 1552   HDL 39 (L) 09/21/2015 1457   HDL 38 (L) 07/02/2013 0058   CHOLHDL 3.3 01/08/2018 1552   VLDL 25 01/08/2018 1552   VLDL 29 07/02/2013 0058   LDLCALC 66 01/08/2018 1552   LDLCALC 78 09/21/2015  1457   LDLCALC 148 (H) 07/02/2013 0058    Additional studies/ records that were reviewed today include:  TAVR OPERATIVE NOTE   Date of Procedure:03/13/2018  Preoperative Diagnosis:Severe Aortic Stenosis  Procedure:   Transcatheter Aortic Valve Replacement - PercutaneousRightTransfemoral Approach Edwards Sapien 3 THV (size 26mm, model # 9600TFX, serial # [id number])  Co-Surgeons:Christopher Clifton James, MD andClarence H. Cornelius Moras, MD   Pre-operative Echo Findings: ? Severe aortic stenosis ? Normalleft ventricular systolic function  Post-operative Echo Findings: ? Noparavalvular leak ? Normalleft ventricular systolic function _____________  Echo 03/14/18 Study Conclusions - Left ventricle: The cavity size was normal. Systolic function was normal. The estimated ejection fraction was in the range of 55% to 60%. Wall motion was normal; there were no regional wall motion abnormalities. Doppler parameters are consistent with abnormal left ventricular relaxation (grade 1 diastolic dysfunction). - Aortic valve: A 26mm Edwards Sapien 3 TAVR bioprosthesis was present and well-seated. Transvalvular velocity was within the normal range. There was no stenosis. There was no regurgitation. Peak velocity (S): 248 cm/s. Mean gradient (S): 12 mm Hg. Valve area (VTI): 1.84 cm^2. Valve area (Vmean): 1.51 cm^2. - Mitral valve: There was no regurgitation. - Right ventricle: The cavity size was normal. Wall thickness was normal. Systolic function was normal. - Tricuspid valve: There was no regurgitation.    ASSESSMENT & PLAN:   Severe AS s/p TAVR:he is doing well. Feels much better than before surgery. Groin sites are healing well. Mild hematomas but stable. SBE prophylaxis discussed; amoxicillin has been prescribed. I will see him back at 1 month for echo and follow up.   HTN: BP is well controlled  today   Urinary retention: this has resolved   CAD: pre TAVR cath showed patent LIMA--> LAD, SVG--> RCA and known occlusion of the SVG--> OM branch. OM fills from R--> L collaterals. Continue medical therapy.  HAs: he has been taking advil  at home for this. I have asked him to try tylenol first since he is on ASA and plavix.   Medication Adjustments/Labs and Tests Ordered: Current medicines are reviewed at length with the patient today.  Concerns regarding medicines are outlined above.  Medication changes, Labs and Tests ordered today are listed in the Patient Instructions below. Patient Instructions  Medication Instructions:  Your physician discussed the importance of taking an antibiotic prior to any dental visits to prevent damage to the heart valves from infection. You were given a prescription for AMOXIL 2,000 mg to take one hour prior to dental appointments.   Labwork: None  Testing/Procedures: No new orders.  Follow-Up: Please keep your appointments on 04/18/18. Please arrive by 12:30PM for your ultrasound and office visit.   Any Other Special Instructions Will Be Listed Below (If Applicable). You are cleared to drive from a cardiac perspective!      Signed, Cline Crock, PA-C  03/22/2018 2:55 PM    [facility] [address] [place], [place], [place]  [address] Phone: [phone]; Fax: [phone]

## 2018-03-22 ENCOUNTER — Other Ambulatory Visit: Payer: Self-pay | Admitting: Physician Assistant

## 2018-03-22 ENCOUNTER — Ambulatory Visit: Payer: BLUE CROSS/BLUE SHIELD | Admitting: Physician Assistant

## 2018-03-22 VITALS — BP 124/80 | HR 77 | Ht 65.0 in | Wt 195.0 lb

## 2018-03-22 DIAGNOSIS — R519 Headache, unspecified: Secondary | ICD-10-CM

## 2018-03-22 DIAGNOSIS — I10 Essential (primary) hypertension: Secondary | ICD-10-CM | POA: Diagnosis not present

## 2018-03-22 DIAGNOSIS — Z952 Presence of prosthetic heart valve: Secondary | ICD-10-CM | POA: Diagnosis not present

## 2018-03-22 DIAGNOSIS — I2510 Atherosclerotic heart disease of native coronary artery without angina pectoris: Secondary | ICD-10-CM | POA: Diagnosis not present

## 2018-03-22 DIAGNOSIS — R51 Headache: Secondary | ICD-10-CM

## 2018-03-22 DIAGNOSIS — R339 Retention of urine, unspecified: Secondary | ICD-10-CM | POA: Diagnosis not present

## 2018-03-22 MED ORDER — AMOXICILLIN 500 MG PO TABS: ORAL_TABLET | ORAL | 10 refills | Status: DC

## 2018-03-22 NOTE — Patient Instructions (Addendum)
Medication Instructions:  Your physician discussed the importance of taking an antibiotic prior to any dental visits to prevent damage to the heart valves from infection. You were given a prescription for AMOXIL 2,000 mg to take one hour prior to dental appointments.   Labwork: None  Testing/Procedures: No new orders.  Follow-Up: Please keep your appointments on 04/18/18. Please arrive by 12:30PM for your ultrasound and office visit.   Any Other Special Instructions Will Be Listed Below (If Applicable). You are cleared to drive from a cardiac perspective!

## 2018-03-23 ENCOUNTER — Encounter: Payer: Self-pay | Admitting: Thoracic Surgery (Cardiothoracic Vascular Surgery)

## 2018-03-23 ENCOUNTER — Ambulatory Visit: Payer: BLUE CROSS/BLUE SHIELD | Admitting: Cardiovascular Disease

## 2018-04-02 ENCOUNTER — Other Ambulatory Visit: Payer: Self-pay | Admitting: Physician Assistant

## 2018-04-02 NOTE — Telephone Encounter (Signed)
Refill Request.  

## 2018-04-16 NOTE — Progress Notes (Signed)
[facility]   MULTIDISCIPLINARY HEART VALVE CLINIC                                       Cardiology Office Note    Date:  04/18/2018   ID:  James LevanWalter Berwanger, DOB 03/14/1956, MRN [id number]  PCP:  Glori LuisSonnenberg, Eric G, MD  Cardiologist: Dr. Kirke CorinArida / Dr. Clifton JamesMcAlhany & Dr. Cornelius Moraswen (TAVR)  CC: 1 month s/p TAVR  History of Present Illness:  James Yates is a 62 y.o. male with a history of CAD s/p CABG (2015), RBBB, HTN, HLD, obesity and severe aortic stenosis s/p TAVR (03/13/18) who presents to clinic for follow up.   He is known to have CAD and underwent CABG in February 2015. At the time of his CABG he had mild aortic stenosis. Cardiac cath July 2019 with patent LIMA to LAD and patent SVG to RCA with known occlusion of the SVG to the obtuse marginal branch. The obtuse marginal fills from right to left collaterals. Echo May 2019 with normal LV systolic function, LVEF 60-65%. 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 The mean gradient across the valve is 38 mmHg and the peak gradient is 71 mmHg. DVI 0.24. AVA 0.6 cm2. He has been seen by Dr. Donata ClayVan Trigt and was felt to be a better candidate for TAVR than redo open surgical procedure.   He was evaluated by the multidisciplinary valve team and underwent successful TAVR with a2026mm Edwards Sapien 3 THV via the TF approach on 03/13/18. Post operative echoshowed EF 55%, normally functioning TAVR with mean gradient of 12mm Hg and no PVL. Post op recovery was complicated by acute urinary retention, sinus tach, and dizziness, which resolved. He was discharged on Aspirin and plavix. Home amlodipine 5mg  daily was discontinue at discharge.   Today he presents to clinic for follow up. He is here alone. An interpreter was used. No CP or SOB. No LE edema, orthopnea or PND. No dizziness or syncope. No blood in stool or urine. No palpitations. 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 He doesn't get shortness of  breath with his daily activities, only when lifting very heavy things. He continues to have some neck and shoulder pain. Overall he feels much better since having his surgery.    Past Medical History:  Diagnosis Date  . Coronary artery disease    s/p CABG  . Glucose intolerance (impaired glucose tolerance)   . Hyperlipidemia   . Hypertension   . Obesity   . S/P TAVR (transcatheter aortic valve replacement)    26 mm Edwards Sapien 3 transcatheter heart valve placed via percutaneous right transfemoral approach   . Severe aortic stenosis     Past Surgical History:  Procedure Laterality Date  . CARDIAC CATHETERIZATION  06/2013   [facility]  . CARDIAC CATHETERIZATION N/A 10/29/2015   Procedure: Left Heart Cath and Cors/Grafts Angiography;  Surgeon: Iran OuchMuhammad A Arida, MD;  Location: [facility] INVASIVE CV LAB;  Service: Cardiovascular;  Laterality: N/A;  . CORONARY ARTERY BYPASS GRAFT N/A 07/05/2013   Procedure: CORONARY ARTERY BYPASS GRAFTING (CABG);  Surgeon: Kerin PernaPeter Van Trigt, MD;  [facility];  Service: Open Heart Surgery;  Laterality: N/A;  . INTRAOPERATIVE TRANSESOPHAGEAL ECHOCARDIOGRAM N/A 07/05/2013   Procedure: INTRAOPERATIVE TRANSESOPHAGEAL ECHOCARDIOGRAM;  Surgeon: Kerin PernaPeter Van Trigt, MD;  [facility];  Service: Open Heart Surgery;  Laterality: N/A;  .  INTRAOPERATIVE TRANSTHORACIC ECHOCARDIOGRAM N/A 03/13/2018   Procedure: INTRAOPERATIVE TRANSTHORACIC ECHOCARDIOGRAM;  Surgeon: Kathleene Hazel, MD;  Location: [facility] OR;  Service: Open Heart Surgery;  Laterality: N/A;  . NO PAST SURGERIES    . RIGHT/LEFT HEART CATH AND CORONARY ANGIOGRAPHY Bilateral 11/27/2017   Procedure: RIGHT/LEFT HEART CATH AND CORONARY ANGIOGRAPHY;  Surgeon: Iran Ouch, MD;  Location: [facility] INVASIVE CV LAB;  Service: Cardiovascular;  Laterality: Bilateral;  . TRANSCATHETER AORTIC VALVE REPLACEMENT, TRANSFEMORAL N/A 03/13/2018   Procedure: TRANSCATHETER AORTIC VALVE REPLACEMENT, TRANSFEMORAL. Edwards Sapien 3  Transcatheter Heart Valve size 26mm.;  Surgeon: Kathleene Hazel, MD;  Location: [facility] OR;  Service: Open Heart Surgery;  Laterality: N/A;    Current Medications: Outpatient Medications Prior to Visit  Medication Sig Dispense Refill  . amLODipine (NORVASC) 5 MG tablet TAKE 1 TABLET BY MOUTH EVERY DAY 90 tablet 0  . amoxicillin (AMOXIL) 500 MG tablet Take 4 tablets (2,000 mg) one hour prior to dental visits. 4 tablet 10  . aspirin EC 81 MG tablet Take 1 tablet (81 mg total) by mouth daily. 30 tablet 3  . atorvastatin (LIPITOR) 80 MG tablet TAKE 1 TABLET (80 MG TOTAL) BY MOUTH DAILY AT 6 PM. (Patient taking differently: Take 80 mg by mouth daily at 6 PM. TAKE 1 TABLET (80 MG TOTAL) BY MOUTH DAILY AT 6 PM.) 90 tablet 3  . clopidogrel (PLAVIX) 75 MG tablet Take 1 tablet (75 mg total) by mouth daily with breakfast. 90 tablet 1  . metoprolol tartrate (LOPRESSOR) 50 MG tablet TAKE 1 TABLET BY MOUTH TWICE A DAY 180 tablet 1  . potassium chloride (K-DUR) 10 MEQ tablet Take 1 tablet (10 mEq total) by mouth daily. 90 tablet 2   No facility-administered medications prior to visit.      Allergies:   Patient has no known allergies.   Social History   Socioeconomic History  . Marital status: Married    Spouse name: Not on file  . Number of children: Not on file  . Years of education: Not on file  . Highest education level: Not on file  Occupational History  . Not on file  Social Needs  . Financial resource strain: Not on file  . Food insecurity:    Worry: Not on file    Inability: Not on file  . Transportation needs:    Medical: Not on file    Non-medical: Not on file  Tobacco Use  . Smoking status: Never Smoker  . Smokeless tobacco: Never Used  Substance and Sexual Activity  . Alcohol use: No  . Drug use: No  . Sexual activity: Not on file  Lifestyle  . Physical activity:    Days per week: Not on file    Minutes per session: Not on file  . Stress: Not on file  Relationships  .  Social connections:    Talks on phone: Not on file    Gets together: Not on file    Attends religious service: Not on file    Active member of club or organization: Not on file    Attends meetings of clubs or organizations: Not on file    Relationship status: Not on file  Other Topics Concern  . Not on file  Social History Narrative  . Not on file     Family History:  The patient's family history includes Heart attack in his father; Heart disease in his father.    ROS:   Please see the history of present  illness.    ROS All other systems reviewed and are negative.   PHYSICAL EXAM:   VS:  BP 128/78   Pulse 88   Ht 5\' 5"  (1.651 m)   Wt 194 lb 12.8 oz (88.4 kg)   SpO2 95%   BMI 32.42 kg/m    GEN: Well nourished, well developed, in no acute distress HEENT: normal Neck: no JVD or masses Cardiac: RRR; soft flow murmur. No rubs, or gallops,no edema  Respiratory:  clear to auscultation bilaterally, normal work of breathing GI: soft, nontender, nondistended, + BS MS: no deformity or atrophy Skin: warm and dry, no rash Neuro:  Alert and Oriented x 3, Strength and sensation are intact Psych: euthymic mood, full affect   Wt Readings from Last 3 Encounters:  04/18/18 194 lb 12.8 oz (88.4 kg)  03/22/18 195 lb (88.5 kg)  03/15/18 197 lb 12.8 oz (89.7 kg)      Studies/Labs Reviewed:   EKG:  EKG is NOT ordered today.    Recent Labs: 03/09/2018: ALT 22; B Natriuretic Peptide 56.2 03/14/2018: Magnesium 1.7 03/15/2018: BUN 7; Creatinine, Ser 0.84; Hemoglobin 12.8; Platelets 112; Potassium 3.4; Sodium 137   Lipid Panel    Component Value Date/Time   CHOL 130 01/08/2018 1552   CHOL 159 09/21/2015 1457   CHOL 215 (H) 07/02/2013 0058   TRIG 127 01/08/2018 1552   TRIG 143 07/02/2013 0058   HDL 39 (L) 01/08/2018 1552   HDL 39 (L) 09/21/2015 1457   HDL 38 (L) 07/02/2013 0058   CHOLHDL 3.3 01/08/2018 1552   VLDL 25 01/08/2018 1552   VLDL 29 07/02/2013 0058   LDLCALC 66  01/08/2018 1552   LDLCALC 78 09/21/2015 1457   LDLCALC 148 (H) 07/02/2013 0058    Additional studies/ records that were reviewed today include:  TAVR OPERATIVE NOTE   Date of Procedure:03/13/2018  Preoperative Diagnosis:Severe Aortic Stenosis  Procedure:   Transcatheter Aortic Valve Replacement - PercutaneousRightTransfemoral Approach Edwards Sapien 3 THV (size 26mm, model # 9600TFX, serial # [id number])  Co-Surgeons:Christopher Clifton James, MD andClarence H. Cornelius Moras, MD   Pre-operative Echo Findings: ? Severe aortic stenosis ? Normalleft ventricular systolic function  Post-operative Echo Findings: ? Noparavalvular leak ? Normalleft ventricular systolic function _____________  Echo 03/14/18 Study Conclusions - Left ventricle: The cavity size was normal. Systolic function was normal. The estimated ejection fraction was in the range of 55% to 60%. Wall motion was normal; there were no regional wall motion abnormalities. Doppler parameters are consistent with abnormal left ventricular relaxation (grade 1 diastolic dysfunction). - Aortic valve: A 26mm Edwards Sapien 3 TAVR bioprosthesis was present and well-seated. Transvalvular velocity was within the normal range. There was no stenosis. There was no regurgitation. Peak velocity (S): 248 cm/s. Mean gradient (S): 12 mm Hg. Valve area (VTI): 1.84 cm^2. Valve area (Vmean): 1.51 cm^2. - Mitral valve: There was no regurgitation. - Right ventricle: The cavity size was normal. Wall thickness was normal. Systolic function was normal. - Tricuspid valve: There was no regurgitation.  _______________  Echo 04/18/18 Study Conclusions - Left ventricle: The cavity size was normal. Wall thickness was normal. Systolic function was normal. The estimated ejection fraction was in the range of 60% to 65%. Wall motion was normal; there were  no regional wall motion abnormalities. Doppler parameters are consistent with abnormal left ventricular relaxation (grade 1 diastolic dysfunction). - Aortic valve: A bioprosthesis was present. 26mm Edwards Sapien 3 THV. TAVR. There was no regurgitation. Peak velocity (S): 264 cm/s. Mean gradient (  S): 12 mm Hg. - Pulmonary arteries: Systolic pressure was mildly increased. PA peak pressure: 33 mm Hg (S).  ASSESSMENT & PLAN:   Severe AS s/p TAVR: echo today shows EF 60-65%, normally functioning TAVR with mean gradient and no PVL. He is doing excellent and feels much better since his TAVR. He has NYHA class I symptoms. SBE prophylaxis discussed; he has amoxicillin. Plavix can be discontinued after 6 months of therapy (08/2018)  HTN: BP well controlled today.   CAD: pre TAVR cath showed patent LIMA--> LAD, SVG--> RCA and known occlusion of the SVG--> OM branch. OM fills from R--> L collaterals. Continue medical therapy.   Medication Adjustments/Labs and Tests Ordered: Current medicines are reviewed at length with the patient today.  Concerns regarding medicines are outlined above.  Medication changes, Labs and Tests ordered today are listed in the Patient Instructions below. Patient Instructions   Medication Instructions:  Your physician recommends that you continue on your current medications as directed. Please refer to the Current Medication list given to you today. You can stop Plavix on April 15,2019. If you need a refill on your cardiac medications before your next appointment, please call your pharmacy.   Lab work: none If you have labs (blood work) drawn today and your tests are completely normal, you will receive your results only by: Marland Kitchen MyChart Message (if you have MyChart) OR . A paper copy in the mail If you have any lab test that is abnormal or we need to change your treatment, we will call you to review the  results.  Testing/Procedures: none  Follow-Up: Your physician recommends that you schedule a follow-up appointment in: 3 months with Dr. Kirke Corin.  You will see K. Janee Morn, PA again in 11 months.  An echocardiogram will be done that day also.  We will call you to schedule this appointment      Signed, Cline Crock, PA-C  04/18/2018 3:38 PM    [facility] [address] [place], [place], [place]  [address] Phone: [phone]; Fax: [phone]

## 2018-04-18 ENCOUNTER — Other Ambulatory Visit (HOSPITAL_COMMUNITY): Payer: BLUE CROSS/BLUE SHIELD

## 2018-04-18 ENCOUNTER — Other Ambulatory Visit: Payer: Self-pay

## 2018-04-18 ENCOUNTER — Ambulatory Visit: Payer: BLUE CROSS/BLUE SHIELD | Admitting: Physician Assistant

## 2018-04-18 ENCOUNTER — Ambulatory Visit (HOSPITAL_COMMUNITY): Payer: BLUE CROSS/BLUE SHIELD | Attending: Cardiology

## 2018-04-18 ENCOUNTER — Other Ambulatory Visit: Payer: Self-pay | Admitting: Physician Assistant

## 2018-04-18 ENCOUNTER — Encounter: Payer: Self-pay | Admitting: Physician Assistant

## 2018-04-18 VITALS — BP 128/78 | HR 88 | Ht 65.0 in | Wt 194.8 lb

## 2018-04-18 DIAGNOSIS — I10 Essential (primary) hypertension: Secondary | ICD-10-CM | POA: Diagnosis not present

## 2018-04-18 DIAGNOSIS — I2510 Atherosclerotic heart disease of native coronary artery without angina pectoris: Secondary | ICD-10-CM

## 2018-04-18 DIAGNOSIS — Z952 Presence of prosthetic heart valve: Secondary | ICD-10-CM

## 2018-04-18 LAB — ECHOCARDIOGRAM COMPLETE
HEIGHTINCHES: 65 in
WEIGHTICAEL: 3116.8 [oz_av]

## 2018-04-18 MED ORDER — PERFLUTREN LIPID MICROSPHERE
1.0000 mL | INTRAVENOUS | Status: AC | PRN
  Administered 2018-04-18: 2 mL via INTRAVENOUS

## 2018-04-18 NOTE — Patient Instructions (Addendum)
Medication Instructions:  Your physician recommends that you continue on your current medications as directed. Please refer to the Current Medication list given to you today. You can stop Plavix on April 15,2020 If you need a refill on your cardiac medications before your next appointment, please call your pharmacy.   Lab work: none If you have labs (blood work) drawn today and your tests are completely normal, you will receive your results only by: Marland Kitchen. MyChart Message (if you have MyChart) OR . A paper copy in the mail If you have any lab test that is abnormal or we need to change your treatment, we will call you to review the results.  Testing/Procedures: none  Follow-Up: Your physician recommends that you schedule a follow-up appointment in: 3 months with Dr. Kirke CorinArida.  You will see K. Janee Mornhompson, PA again in 11 months.  An echocardiogram will be done that day also.  We will call you to schedule this appointment

## 2018-04-19 ENCOUNTER — Other Ambulatory Visit (HOSPITAL_COMMUNITY): Payer: BLUE CROSS/BLUE SHIELD

## 2018-05-02 ENCOUNTER — Encounter: Payer: Self-pay | Admitting: Thoracic Surgery (Cardiothoracic Vascular Surgery)

## 2018-05-07 ENCOUNTER — Encounter: Payer: Self-pay | Admitting: *Deleted

## 2018-05-07 ENCOUNTER — Encounter: Payer: BLUE CROSS/BLUE SHIELD | Attending: Cardiovascular Disease | Admitting: *Deleted

## 2018-05-07 VITALS — Ht 64.5 in | Wt 196.4 lb

## 2018-05-07 DIAGNOSIS — I2510 Atherosclerotic heart disease of native coronary artery without angina pectoris: Secondary | ICD-10-CM | POA: Insufficient documentation

## 2018-05-07 DIAGNOSIS — Z79899 Other long term (current) drug therapy: Secondary | ICD-10-CM | POA: Insufficient documentation

## 2018-05-07 DIAGNOSIS — Z7982 Long term (current) use of aspirin: Secondary | ICD-10-CM | POA: Insufficient documentation

## 2018-05-07 DIAGNOSIS — Z952 Presence of prosthetic heart valve: Secondary | ICD-10-CM

## 2018-05-07 DIAGNOSIS — E785 Hyperlipidemia, unspecified: Secondary | ICD-10-CM | POA: Insufficient documentation

## 2018-05-07 DIAGNOSIS — Z951 Presence of aortocoronary bypass graft: Secondary | ICD-10-CM | POA: Insufficient documentation

## 2018-05-07 DIAGNOSIS — I10 Essential (primary) hypertension: Secondary | ICD-10-CM | POA: Insufficient documentation

## 2018-05-07 DIAGNOSIS — Z7902 Long term (current) use of antithrombotics/antiplatelets: Secondary | ICD-10-CM | POA: Insufficient documentation

## 2018-05-07 NOTE — Patient Instructions (Signed)
Patient Instructions  Patient Details  Name: James Yates MRN: [id number] Date of Birth: 04/15/1956 Referring Provider:  Iran Ouch, MD  Below are your personal goals for exercise, nutrition, and risk factors. Our goal is to help you stay on track towards obtaining and maintaining these goals. We will be discussing your progress on these goals with you throughout the program.  Initial Exercise Prescription: Initial Exercise Prescription - 05/07/18 1300      Date of Initial Exercise RX and Referring Provider   Date  05/07/18    Referring Provider  Lorine Bears MD      Treadmill   MPH  2.1    Grade  0.5    Minutes  15    METs  2.75      NuStep   Level  2    SPM  80    Minutes  15    METs  2.5      Recumbant Elliptical   Level  1    RPM  50    Minutes  15    METs  2.5      Prescription Details   Frequency (times per week)  3    Duration  Progress to 45 minutes of aerobic exercise without signs/symptoms of physical distress      Intensity   THRR 40-80% of Max Heartrate  110-143    Ratings of Perceived Exertion  11-13    Perceived Dyspnea  0-4      Progression   Progression  Continue to progress workloads to maintain intensity without signs/symptoms of physical distress.      Resistance Training   Training Prescription  Yes    Weight  3 lbs    Reps  10-15       Exercise Goals: Frequency: Be able to perform aerobic exercise two to three times per week in program working toward 2-5 days per week of home exercise.  Intensity: Work with a perceived exertion of 11 (fairly light) - 15 (hard) while following your exercise prescription.  We will make changes to your prescription with you as you progress through the program.   Duration: Be able to do 30 to 45 minutes of continuous aerobic exercise in addition to a 5 minute warm-up and a 5 minute cool-down routine.   Nutrition Goals: Your personal nutrition goals will be established when you do your nutrition  analysis with the dietician.  The following are general nutrition guidelines to follow: Cholesterol < 200mg /day Sodium < 1500mg /day Fiber: Men over 50 yrs - 30 grams per day  Personal Goals: Personal Goals and Risk Factors at Admission - 05/07/18 1143      Core Components/Risk Factors/Patient Goals on Admission    Weight Management  Yes;Weight Loss    Admit Weight  196 lb 6.4 oz (89.1 kg)    Goal Weight: Short Term  194 lb (88 kg)    Goal Weight: Long Term  180 lb (81.6 kg)    Expected Outcomes  Short Term: Continue to assess and modify interventions until short term weight is achieved;Long Term: Adherence to nutrition and physical activity/exercise program aimed toward attainment of established weight goal;Weight Loss: Understanding of general recommendations for a balanced deficit meal plan, which promotes 1-2 lb weight loss per week and includes a negative energy balance of [phone] kcal/d    Hypertension  Yes    Intervention  Provide education on lifestyle modifcations including regular physical activity/exercise, weight management, moderate sodium restriction and increased consumption of  fresh fruit, vegetables, and low fat dairy, alcohol moderation, and smoking cessation.;Monitor prescription use compliance.    Expected Outcomes  Short Term: Continued assessment and intervention until BP is < 140/88mm HG in hypertensive participants. < 130/47mm HG in hypertensive participants with diabetes, heart failure or chronic kidney disease.;Long Term: Maintenance of blood pressure at goal levels.    Lipids  Yes    Intervention  Provide education and support for participant on nutrition & aerobic/resistive exercise along with prescribed medications to achieve LDL 70mg , HDL >40mg .    Expected Outcomes  Short Term: Participant states understanding of desired cholesterol values and is compliant with medications prescribed. Participant is following exercise prescription and nutrition guidelines.;Long  Term: Cholesterol controlled with medications as prescribed, with individualized exercise RX and with personalized nutrition plan. Value goals: LDL < 70mg , HDL > 40 mg.       Tobacco Use Initial Evaluation: Social History   Tobacco Use  Smoking Status Never Smoker  Smokeless Tobacco Never Used    Exercise Goals and Review: Exercise Goals    Row Name 05/07/18 1302             Exercise Goals   Increase Physical Activity  Yes       Intervention  Provide advice, education, support and counseling about physical activity/exercise needs.;Develop an individualized exercise prescription for aerobic and resistive training based on initial evaluation findings, risk stratification, comorbidities and participant's personal goals.       Expected Outcomes  Short Term: Attend rehab on a regular basis to increase amount of physical activity.;Long Term: Add in home exercise to make exercise part of routine and to increase amount of physical activity.;Long Term: Exercising regularly at least 3-5 days a week.       Increase Strength and Stamina  Yes       Intervention  Provide advice, education, support and counseling about physical activity/exercise needs.;Develop an individualized exercise prescription for aerobic and resistive training based on initial evaluation findings, risk stratification, comorbidities and participant's personal goals.       Expected Outcomes  Short Term: Increase workloads from initial exercise prescription for resistance, speed, and METs.;Short Term: Perform resistance training exercises routinely during rehab and add in resistance training at home;Long Term: Improve cardiorespiratory fitness, muscular endurance and strength as measured by increased METs and functional capacity ( )       Able to understand and use rate of perceived exertion (RPE) scale  Yes       Intervention  Provide education and explanation on how to use RPE scale       Expected Outcomes  Short Term: Able to  use RPE daily in rehab to express subjective intensity level;Long Term:  Able to use RPE to guide intensity level when exercising independently       Knowledge and understanding of Target Heart Rate Range (THRR)  Yes       Intervention  Provide education and explanation of THRR including how the numbers were predicted and where they are located for reference       Expected Outcomes  Short Term: Able to state/look up THRR;Short Term: Able to use daily as guideline for intensity in rehab;Long Term: Able to use THRR to govern intensity when exercising independently       Able to check pulse independently  Yes       Intervention  Provide education and demonstration on how to check pulse in carotid and radial arteries.;Review the importance of being able to check  your own pulse for safety during independent exercise       Expected Outcomes  Short Term: Able to explain why pulse checking is important during independent exercise;Long Term: Able to check pulse independently and accurately       Understanding of Exercise Prescription  Yes       Intervention  Provide education, explanation, and written materials on patient's individual exercise prescription       Expected Outcomes  Short Term: Able to explain program exercise prescription;Long Term: Able to explain home exercise prescription to exercise independently          Copy of goals given to participant.

## 2018-05-07 NOTE — Progress Notes (Signed)
Cardiac Individual Treatment Plan  Patient Details  Name: James Yates MRN: [id number] Date of Birth: 09/30/1955 Referring Provider:     Cardiac Rehab from 05/07/2018 in [facility] Cardiac and Pulmonary Rehab  Referring Provider  [name] MD      Initial Encounter Date:    Cardiac Rehab from 05/07/2018 in [facility] Cardiac and Pulmonary Rehab  Date  05/07/18      Visit Diagnosis: S/P TAVR (transcatheter aortic valve replacement)  Patient's Home Medications on Admission:  Current Outpatient Medications:  .  amLODipine (NORVASC) 5 MG tablet, TAKE 1 TABLET BY MOUTH EVERY DAY, Disp: 90 tablet, Rfl: 0 .  amoxicillin (AMOXIL) 500 MG tablet, Take 4 tablets (2,000 mg) one hour prior to dental visits., Disp: 4 tablet, Rfl: 10 .  aspirin EC 81 MG tablet, Take 1 tablet (81 mg total) by mouth daily., Disp: 30 tablet, Rfl: 3 .  atorvastatin (LIPITOR) 80 MG tablet, TAKE 1 TABLET (80 MG TOTAL) BY MOUTH DAILY AT 6 PM. (Patient taking differently: Take 80 mg by mouth daily at 6 PM. TAKE 1 TABLET (80 MG TOTAL) BY MOUTH DAILY AT 6 PM.), Disp: 90 tablet, Rfl: 3 .  clopidogrel (PLAVIX) 75 MG tablet, Take 1 tablet (75 mg total) by mouth daily with breakfast., Disp: 90 tablet, Rfl: 1 .  metoprolol tartrate (LOPRESSOR) 50 MG tablet, TAKE 1 TABLET BY MOUTH TWICE A DAY, Disp: 180 tablet, Rfl: 1 .  potassium chloride (K-DUR) 10 MEQ tablet, Take 1 tablet (10 mEq total) by mouth daily., Disp: 90 tablet, Rfl: 2  Past Medical History: Past Medical History:  Diagnosis Date  . Coronary artery disease    s/p CABG  . Glucose intolerance (impaired glucose tolerance)   . Hyperlipidemia   . Hypertension   . Obesity   . S/P TAVR (transcatheter aortic valve replacement)    26 mm Edwards Sapien 3 transcatheter heart valve placed via percutaneous right transfemoral approach   . Severe aortic stenosis     Tobacco Use: Social History   Tobacco Use  Smoking Status Never Smoker  Smokeless Tobacco Never Used     Labs: Recent Review Flowsheet Data    Labs for ITP Cardiac and Pulmonary Rehab Latest Ref Rng & Units 03/09/2018 03/13/2018 03/13/2018 03/13/2018 03/13/2018   Cholestrol 0 - 200 mg/dL - - - - -   LDLCALC 0 - 99 mg/dL - - - - -   HDL >40 mg/dL - - - - -   Trlycerides <150 mg/dL - - - - -   Hemoglobin A1c 4.8 - 5.6 % 6.3(H) - - - -   PHART 7.350 - 7.450 7.431 - - - 7.385   PCO2ART 32.0 - 48.0 mmHg 42.0 - - - 40.6   HCO3 20.0 - 28.0 mmol/L 27.4 - - - 24.4   TCO2 22 - 32 mmol/L - _0 ACIDBASEDEF 0.0 - 2.0 mmol/L - - - - 1.0   O2SAT % 92.4 - - - 100.0       Exercise Target Goals: Exercise Program Goal: Individual exercise prescription set using results from initial 6 min walk test and THRR while considering  patient's activity barriers and safety.   Exercise Prescription Goal: Initial exercise prescription builds to 30-45 minutes a day of aerobic activity, 2-3 days per week.  Home exercise guidelines will be given to patient during program as part of exercise prescription that the participant will acknowledge.  Activity Barriers & Risk Stratification: Activity Barriers & Cardiac Risk  Stratification - 05/07/18 1133      Activity Barriers & Cardiac Risk Stratification   Activity Barriers  Joint Problems;Deconditioning;Muscular Weakness;Shortness of Breath   Left knee hurts. advised to see primary MD to check his knee.  States no swelling, has been hurting for a few days. No history of injury.   Cardiac Risk Stratification  High       6 Minute Walk: 6 Minute Walk    Row Name 05/07/18 1259         6 Minute Walk   Phase  Initial     Distance  1155 feet     Walk Time  6 minutes     # of Rest Breaks  0     MPH  2.19     METS  2.81     RPE  9     Perceived Dyspnea   1     VO2 Peak  9.85     Symptoms  Yes (comment)     Comments  SOB, knee pain 4/10     Resting HR  77 bpm     Resting BP  136/64     Resting Oxygen Saturation   98 %     Exercise Oxygen  Saturation  during 6 min walk  100 %     Max Ex. HR  98 bpm     Max Ex. BP  138/76     2 Minute Post BP  128/62        Oxygen Initial Assessment:   Oxygen Re-Evaluation:   Oxygen Discharge (Final Oxygen Re-Evaluation):   Initial Exercise Prescription: Initial Exercise Prescription - 05/07/18 1300      Date of Initial Exercise RX and Referring Provider   Date  05/07/18    Referring Provider  [name] MD      Treadmill   MPH  2.1    Grade  0.5    Minutes  15    METs  2.75      NuStep   Level  2    SPM  80    Minutes  15    METs  2.5      Recumbant Elliptical   Level  1    RPM  50    Minutes  15    METs  2.5      Prescription Details   Frequency (times per week)  3    Duration  Progress to 45 minutes of aerobic exercise without signs/symptoms of physical distress      Intensity   THRR 40-80% of Max Heartrate  110-143    Ratings of Perceived Exertion  11-13    Perceived Dyspnea  0-4      Progression   Progression  Continue to progress workloads to maintain intensity without signs/symptoms of physical distress.      Resistance Training   Training Prescription  Yes    Weight  3 lbs    Reps  10-15       Perform Capillary Blood Glucose checks as needed.  Exercise Prescription Changes: Exercise Prescription Changes    Row Name 05/07/18 1300             Response to Exercise   Blood Pressure (Admit)  136/64       Blood Pressure (Exercise)  138/76       Blood Pressure (Exit)  128/62       Heart Rate (Admit)  77 bpm       Heart  Rate (Exercise)  98 bpm       Heart Rate (Exit)  83 bpm       Oxygen Saturation (Admit)  98 %       Oxygen Saturation (Exercise)  100 %       Rating of Perceived Exertion (Exercise)  9       Perceived Dyspnea (Exercise)  1       Symptoms  SOB, knee pain 4/10       Comments  walk test results          Exercise Comments:   Exercise Goals and Review: Exercise Goals    Row Name 05/07/18 1302              Exercise Goals   Increase Physical Activity  Yes       Intervention  Provide advice, education, support and counseling about physical activity/exercise needs.;Develop an individualized exercise prescription for aerobic and resistive training based on initial evaluation findings, risk stratification, comorbidities and participant's personal goals.       Expected Outcomes  Short Term: Attend rehab on a regular basis to increase amount of physical activity.;Long Term: Add in home exercise to make exercise part of routine and to increase amount of physical activity.;Long Term: Exercising regularly at least 3-5 days a week.       Increase Strength and Stamina  Yes       Intervention  Provide advice, education, support and counseling about physical activity/exercise needs.;Develop an individualized exercise prescription for aerobic and resistive training based on initial evaluation findings, risk stratification, comorbidities and participant's personal goals.       Expected Outcomes  Short Term: Increase workloads from initial exercise prescription for resistance, speed, and METs.;Short Term: Perform resistance training exercises routinely during rehab and add in resistance training at home;Long Term: Improve cardiorespiratory fitness, muscular endurance and strength as measured by increased METs and functional capacity (6MWT)       Able to understand and use rate of perceived exertion (RPE) scale  Yes       Intervention  Provide education and explanation on how to use RPE scale       Expected Outcomes  Short Term: Able to use RPE daily in rehab to express subjective intensity level;Long Term:  Able to use RPE to guide intensity level when exercising independently       Knowledge and understanding of Target Heart Rate Range (THRR)  Yes       Intervention  Provide education and explanation of THRR including how the numbers were predicted and where they are located for reference       Expected Outcomes  Short  Term: Able to state/look up THRR;Short Term: Able to use daily as guideline for intensity in rehab;Long Term: Able to use THRR to govern intensity when exercising independently       Able to check pulse independently  Yes       Intervention  Provide education and demonstration on how to check pulse in carotid and radial arteries.;Review the importance of being able to check your own pulse for safety during independent exercise       Expected Outcomes  Short Term: Able to explain why pulse checking is important during independent exercise;Long Term: Able to check pulse independently and accurately       Understanding of Exercise Prescription  Yes       Intervention  Provide education, explanation, and written materials on patient's individual exercise prescription  Expected Outcomes  Short Term: Able to explain program exercise prescription;Long Term: Able to explain home exercise prescription to exercise independently          Exercise Goals Re-Evaluation :   Discharge Exercise Prescription (Final Exercise Prescription Changes): Exercise Prescription Changes - 05/07/18 1300      Response to Exercise   Blood Pressure (Admit)  136/64    Blood Pressure (Exercise)  138/76    Blood Pressure (Exit)  128/62    Heart Rate (Admit)  77 bpm    Heart Rate (Exercise)  98 bpm    Heart Rate (Exit)  83 bpm    Oxygen Saturation (Admit)  98 %    Oxygen Saturation (Exercise)  100 %    Rating of Perceived Exertion (Exercise)  9    Perceived Dyspnea (Exercise)  1    Symptoms  SOB, knee pain 4/10    Comments  walk test results       Nutrition:  Target Goals: Understanding of nutrition guidelines, daily intake of sodium '1500mg'$ , cholesterol '200mg'$ , calories 30% from fat and 7% or less from saturated fats, daily to have 5 or more servings of fruits and vegetables.  Biometrics: Pre Biometrics - 05/07/18 1303      Pre Biometrics   Height  5' 4.5" (1.638 m)    Weight  196 lb 6.4 oz (89.1 kg)     Waist Circumference  41 inches    Hip Circumference  40.75 inches    Waist to Hip Ratio  1.01 %    BMI (Calculated)  33.2    Single Leg Stand  14.34 seconds        Nutrition Therapy Plan and Nutrition Goals: Nutrition Therapy & Goals - 05/07/18 1141      Intervention Plan   Intervention  Prescribe, educate and counsel regarding individualized specific dietary modifications aiming towards targeted core components such as weight, hypertension, lipid management, diabetes, heart failure and other comorbidities.    Expected Outcomes  Short Term Goal: Understand basic principles of dietary content, such as calories, fat, sodium, cholesterol and nutrients.;Short Term Goal: A plan has been developed with personal nutrition goals set during dietitian appointment.;Long Term Goal: Adherence to prescribed nutrition plan.       Nutrition Assessments: Nutrition Assessments - 05/07/18 1142      MEDFICTS Scores   Pre Score  36       Nutrition Goals Re-Evaluation:   Nutrition Goals Discharge (Final Nutrition Goals Re-Evaluation):   Psychosocial: Target Goals: Acknowledge presence or absence of significant depression and/or stress, maximize coping skills, provide positive support system. Participant is able to verbalize types and ability to use techniques and skills needed for reducing stress and depression.   Initial Review & Psychosocial Screening: Initial Psych Review & Screening - 05/07/18 1134      Initial Review   Current issues with  None Identified      Family Dynamics   Good Support System?  Yes   Wife and children     Barriers   Psychosocial barriers to participate in program  There are no identifiable barriers or psychosocial needs.;The patient should benefit from training in stress management and relaxation.      Screening Interventions   Interventions  Encouraged to exercise;Program counselor consult;To provide support and resources with identified psychosocial  needs;Provide feedback about the scores to participant    Expected Outcomes  Short Term goal: Utilizing psychosocial counselor, staff and physician to assist with identification of specific Stressors or  current issues interfering with healing process. Setting desired goal for each stressor or current issue identified.;Long Term Goal: Stressors or current issues are controlled or eliminated.;Short Term goal: Identification and review with participant of any Quality of Life or Depression concerns found by scoring the questionnaire.;Long Term goal: The participant improves quality of Life and PHQ9 Scores as seen by post scores and/or verbalization of changes       Quality of Life Scores:  Quality of Life - 05/07/18 1134      Quality of Life   Select  Quality of Life      Quality of Life Scores   Health/Function Pre  25.5 %    Socioeconomic Pre  23.25 %    Psych/Spiritual Pre  24.58 %    Family Pre  28.8 %    GLOBAL Pre  25.29 %      Scores of 19 and below usually indicate a poorer quality of life in these areas.  A difference of  2-3 points is a clinically meaningful difference.  A difference of 2-3 points in the total score of the Quality of Life Index has been associated with significant improvement in overall quality of life, self-image, physical symptoms, and general health in studies assessing change in quality of life.  PHQ-9: Recent Review Flowsheet Data    Depression screen [facility] 2/9 05/07/2018   Decreased Interest 3   Down, Depressed, Hopeless 1   PHQ - 2 Score 4   Altered sleeping 1   Tired, decreased energy 1   Change in appetite 2   Feeling bad or failure about yourself  0   Trouble concentrating 0   Moving slowly or fidgety/restless 2   Suicidal thoughts 0   PHQ-9 Score 10   Difficult doing work/chores Somewhat difficult     Interpretation of Total Score  Total Score Depression Severity:  1-4 = Minimal depression, 5-9 = Mild depression, 10-14 = Moderate depression,  15-19 = Moderately severe depression, 20-27 = Severe depression   Psychosocial Evaluation and Intervention:   Psychosocial Re-Evaluation:   Psychosocial Discharge (Final Psychosocial Re-Evaluation):   Vocational Rehabilitation: Provide vocational rehab assistance to qualifying candidates.   Vocational Rehab Evaluation & Intervention: Vocational Rehab - 05/07/18 1143      Initial Vocational Rehab Evaluation & Intervention   Assessment shows need for Vocational Rehabilitation  No       Education: Education Goals: Education classes will be provided on a variety of topics geared toward better understanding of heart health and risk factor modification. Participant will state understanding/return demonstration of topics presented as noted by education test scores.  Learning Barriers/Preferences: Learning Barriers/Preferences - 05/07/18 1142      Learning Barriers/Preferences   Learning Barriers  Sight;Language   interpreter to be at class   Learning Preferences  Skilled Demonstration       Education Topics:  AED/CPR: - Group verbal and written instruction with the use of models to demonstrate the basic use of the AED with the basic ABC's of resuscitation.   General Nutrition Guidelines/Fats and Fiber: -Group instruction provided by verbal, written material, models and posters to present the general guidelines for heart healthy nutrition. Gives an explanation and review of dietary fats and fiber.   Controlling Sodium/Reading Food Labels: -Group verbal and written material supporting the discussion of sodium use in heart healthy nutrition. Review and explanation with models, verbal and written materials for utilization of the food label.   Exercise Physiology & General Exercise Guidelines: - Group  verbal and written instruction with models to review the exercise physiology of the cardiovascular system and associated critical values. Provides general exercise guidelines with  specific guidelines to those with heart or lung disease.    Aerobic Exercise & Resistance Training: - Gives group verbal and written instruction on the various components of exercise. Focuses on aerobic and resistive training programs and the benefits of this training and how to safely progress through these programs..   Flexibility, Balance, Mind/Body Relaxation: Provides group verbal/written instruction on the benefits of flexibility and balance training, including mind/body exercise modes such as yoga, pilates and tai chi.  Demonstration and skill practice provided.   Stress and Anxiety: - Provides group verbal and written instruction about the health risks of elevated stress and causes of high stress.  Discuss the correlation between heart/lung disease and anxiety and treatment options. Review healthy ways to manage with stress and anxiety.   Depression: - Provides group verbal and written instruction on the correlation between heart/lung disease and depressed mood, treatment options, and the stigmas associated with seeking treatment.   Anatomy & Physiology of the Heart: - Group verbal and written instruction and models provide basic cardiac anatomy and physiology, with the coronary electrical and arterial systems. Review of Valvular disease and Heart Failure   Cardiac Procedures: - Group verbal and written instruction to review commonly prescribed medications for heart disease. Reviews the medication, class of the drug, and side effects. Includes the steps to properly store meds and maintain the prescription regimen. (beta blockers and nitrates)   Cardiac Medications I: - Group verbal and written instruction to review commonly prescribed medications for heart disease. Reviews the medication, class of the drug, and side effects. Includes the steps to properly store meds and maintain the prescription regimen.   Cardiac Medications II: -Group verbal and written instruction to review  commonly prescribed medications for heart disease. Reviews the medication, class of the drug, and side effects. (all other drug classes)    Go Sex-Intimacy & Heart Disease, Get SMART - Goal Setting: - Group verbal and written instruction through game format to discuss heart disease and the return to sexual intimacy. Provides group verbal and written material to discuss and apply goal setting through the application of the S.M.A.R.T. Method.   Other Matters of the Heart: - Provides group verbal, written materials and models to describe Stable Angina and Peripheral Artery. Includes description of the disease process and treatment options available to the cardiac patient.   Exercise & Equipment Safety: - Individual verbal instruction and demonstration of equipment use and safety with use of the equipment.   Cardiac Rehab from 05/07/2018 in [facility] Cardiac and Pulmonary Rehab  Date  05/07/18  Educator  SB  Instruction Review Code  1- Verbalizes Understanding      Infection Prevention: - Provides verbal and written material to individual with discussion of infection control including proper hand washing and proper equipment cleaning during exercise session.   Cardiac Rehab from 05/07/2018 in [facility] Cardiac and Pulmonary Rehab  Date  05/07/18  Educator  SB  Instruction Review Code  1- Verbalizes Understanding      Falls Prevention: - Provides verbal and written material to individual with discussion of falls prevention and safety.   Cardiac Rehab from 05/07/2018 in [facility] Cardiac and Pulmonary Rehab  Date  05/07/18  Educator  SB  Instruction Review Code  1- Verbalizes Understanding      Diabetes: - Individual verbal and written instruction to review signs/symptoms of  diabetes, desired ranges of glucose level fasting, after meals and with exercise. Acknowledge that pre and post exercise glucose checks will be done for 3 sessions at entry of program.   Know Your Numbers and Risk  Factors: -Group verbal and written instruction about important numbers in your health.  Discussion of what are risk factors and how they play a role in the disease process.  Review of Cholesterol, Blood Pressure, Diabetes, and BMI and the role they play in your overall health.   Sleep Hygiene: -Provides group verbal and written instruction about how sleep can affect your health.  Define sleep hygiene, discuss sleep cycles and impact of sleep habits. Review good sleep hygiene tips.    Other: -Provides group and verbal instruction on various topics (see comments)   Knowledge Questionnaire Score: Knowledge Questionnaire Score - 05/07/18 1142      Knowledge Questionnaire Score   Pre Score  24/26   Reviewed correct responses today with Yanky and his family member. Verbalized understanding and no questions were asked      Core Components/Risk Factors/Patient Goals at Admission: Personal Goals and Risk Factors at Admission - 05/07/18 1143      Core Components/Risk Factors/Patient Goals on Admission    Weight Management  Yes;Weight Loss    Admit Weight  196 lb 6.4 oz (89.1 kg)    Goal Weight: Short Term  194 lb (88 kg)    Goal Weight: Long Term  180 lb (81.6 kg)    Expected Outcomes  Short Term: Continue to assess and modify interventions until short term weight is achieved;Long Term: Adherence to nutrition and physical activity/exercise program aimed toward attainment of established weight goal;Weight Loss: Understanding of general recommendations for a balanced deficit meal plan, which promotes 1-2 lb weight loss per week and includes a negative energy balance of [phone] kcal/d    Hypertension  Yes    Intervention  Provide education on lifestyle modifcations including regular physical activity/exercise, weight management, moderate sodium restriction and increased consumption of fresh fruit, vegetables, and low fat dairy, alcohol moderation, and smoking cessation.;Monitor prescription use  compliance.    Expected Outcomes  Short Term: Continued assessment and intervention until BP is < 140/50m HG in hypertensive participants. < 130/831mHG in hypertensive participants with diabetes, heart failure or chronic kidney disease.;Long Term: Maintenance of blood pressure at goal levels.    Lipids  Yes    Intervention  Provide education and support for participant on nutrition & aerobic/resistive exercise along with prescribed medications to achieve LDL '70mg'$ , HDL >'40mg'$ .    Expected Outcomes  Short Term: Participant states understanding of desired cholesterol values and is compliant with medications prescribed. Participant is following exercise prescription and nutrition guidelines.;Long Term: Cholesterol controlled with medications as prescribed, with individualized exercise RX and with personalized nutrition plan. Value goals: LDL < '70mg'$ , HDL > 40 mg.       Core Components/Risk Factors/Patient Goals Review:    Core Components/Risk Factors/Patient Goals at Discharge (Final Review):    ITP Comments: ITP Comments    Row Name 05/07/18 1305           ITP Comments  Medical evaluation completed with interpreter present.  [facility]  Initial ITP created and sent for review and signature to Dr. MaEmily FilbertMedical Director.           Comments: Initial ITP

## 2018-05-10 DIAGNOSIS — Z952 Presence of prosthetic heart valve: Secondary | ICD-10-CM

## 2018-05-10 NOTE — Progress Notes (Signed)
Daily Session Note  Patient Details  Name: James Yates MRN: [id number] Date of Birth: [birth year] Referring Provider:     Cardiac Rehab from 05/07/2018 in [facility] Cardiac and Pulmonary Rehab  Referring Provider  [name] MD      Encounter Date: 05/10/2018  Check In: Session Check In - 05/10/18 1618      Check-In   Supervising physician immediately available to respond to emergencies  See telemetry face sheet for immediately available ER MD    Location  [facility]-Cardiac & Pulmonary Rehab    Staff Present  Renita Papa, RN BSN;Tawan Degroote [address] [place], [place], ACSM CEP, Exercise Physiologist    Medication changes reported      No    Fall or balance concerns reported     No    Warm-up and Cool-down  Performed as group-led instruction    Resistance Training Performed  Yes    VAD Patient?  No    PAD/SET Patient?  No      Pain Assessment   Currently in Pain?  No/denies          Social History   Tobacco Use  Smoking Status Never Smoker  Smokeless Tobacco Never Used    Goals Met:  Exercise tolerated well Personal goals reviewed No report of cardiac concerns or symptoms Strength training completed today  Goals Unmet:  Not Applicable  Comments: First full day of exercise!  Patient was oriented to gym and equipment including functions, settings, policies, and procedures.  Patient's individual exercise prescription and treatment plan were reviewed.  All starting workloads were established based on the results of the 6 minute walk test done at initial orientation visit.  The plan for exercise progression was also introduced and progression will be customized based on patient's performance and goals.    Dr. Emily Filbert is Medical Director for [place] and [facility].

## 2018-05-14 DIAGNOSIS — Z952 Presence of prosthetic heart valve: Secondary | ICD-10-CM

## 2018-05-14 NOTE — Progress Notes (Signed)
Daily Session Note  Patient Details  Name: James Yates MRN: [id number] Date of Birth: Jun 16, 1955 Referring Provider:     Cardiac Rehab from 05/07/2018 in [facility] Cardiac and Pulmonary Rehab  Referring Provider  [name] MD      Encounter Date: 05/14/2018  Check In:      Social History   Tobacco Use  Smoking Status Never Smoker  Smokeless Tobacco Never Used    Goals Met:  Independence with exercise equipment Exercise tolerated well No report of cardiac concerns or symptoms Strength training completed today  Goals Unmet:  Not Applicable  Comments: Pt able to follow exercise prescription today without complaint.  Will continue to monitor for progression.    Dr. Emily Filbert is Medical Director for [place] and [facility].

## 2018-05-16 ENCOUNTER — Encounter: Payer: BLUE CROSS/BLUE SHIELD | Admitting: *Deleted

## 2018-05-16 DIAGNOSIS — Z952 Presence of prosthetic heart valve: Secondary | ICD-10-CM | POA: Diagnosis not present

## 2018-05-16 NOTE — Progress Notes (Signed)
Daily Session Note  Patient Details  Name: James Yates MRN: [id number] Date of Birth: 08/23/1955 Referring Provider:     Cardiac Rehab from 05/07/2018 in [facility] Cardiac and Pulmonary Rehab  Referring Provider  [name] MD      Encounter Date: 05/16/2018  Check In: Session Check In - 05/16/18 1620      Check-In   Supervising physician immediately available to respond to emergencies  See telemetry face sheet for immediately available ER MD    Location  [facility]-Cardiac & Pulmonary Rehab    Staff Present  Renita Papa, RN BSN;Carroll Enterkin, RN, BSN;Jeanna Durrell BS, Exercise Physiologist    Medication changes reported      No    Fall or balance concerns reported     No    Warm-up and Cool-down  Performed as group-led Higher education careers adviser Performed  Yes    VAD Patient?  No    PAD/SET Patient?  No      Pain Assessment   Currently in Pain?  No/denies          Social History   Tobacco Use  Smoking Status Never Smoker  Smokeless Tobacco Never Used    Goals Met:  Independence with exercise equipment Exercise tolerated well No report of cardiac concerns or symptoms Strength training completed today  Goals Unmet:  Not Applicable  Comments: Pt able to follow exercise prescription today without complaint.  Will continue to monitor for progression.    Dr. Emily Filbert is Medical Director for [place] and [facility].

## 2018-05-17 DIAGNOSIS — Z952 Presence of prosthetic heart valve: Secondary | ICD-10-CM | POA: Diagnosis not present

## 2018-05-17 NOTE — Progress Notes (Signed)
Daily Session Note  Patient Details  Name: James Yates MRN: [id number] Date of Birth: [birth year] Referring Provider:     Cardiac Rehab from 05/07/2018 in [facility] Cardiac and Pulmonary Rehab  Referring Provider  [name] MD      Encounter Date: 05/17/2018  Check In: Session Check In - 05/17/18 1612      Check-In   Supervising physician immediately available to respond to emergencies  See telemetry face sheet for immediately available ER MD    Location  [facility]-Cardiac & Pulmonary Rehab    Staff Present  Earlean Shawl, BS, ACSM CEP, Exercise Physiologist;Evynn Boutelle Alcus Dad, RN BSN    Medication changes reported      No    Fall or balance concerns reported     No    Warm-up and Cool-down  Performed as group-led Higher education careers adviser Performed  Yes    VAD Patient?  No    PAD/SET Patient?  No      Pain Assessment   Currently in Pain?  No/denies          Social History   Tobacco Use  Smoking Status Never Smoker  Smokeless Tobacco Never Used    Goals Met:  Independence with exercise equipment Exercise tolerated well No report of cardiac concerns or symptoms Strength training completed today  Goals Unmet:  Not Applicable  Comments: Pt able to follow exercise prescription today without complaint.  Will continue to monitor for progression.    Dr. Emily Filbert is Medical Director for [place] and [facility].

## 2018-05-21 DIAGNOSIS — Z952 Presence of prosthetic heart valve: Secondary | ICD-10-CM | POA: Diagnosis not present

## 2018-05-21 NOTE — Progress Notes (Signed)
Daily Session Note  Patient Details  Name: James Yates MRN: [id number] Date of Birth: August 02, 1955 Referring Provider:     Cardiac Rehab from 05/07/2018 in [facility] Cardiac and Pulmonary Rehab  Referring Provider  [name] MD      Encounter Date: 05/21/2018  Check In:      Social History   Tobacco Use  Smoking Status Never Smoker  Smokeless Tobacco Never Used    Goals Met:  Independence with exercise equipment Exercise tolerated well No report of cardiac concerns or symptoms Strength training completed today  Goals Unmet:  Not Applicable  Comments: Pt able to follow exercise prescription today without complaint.  Will continue to monitor for progression.    Dr. Emily Filbert is Medical Director for [place] and [facility].

## 2018-05-24 DIAGNOSIS — Z952 Presence of prosthetic heart valve: Secondary | ICD-10-CM

## 2018-05-24 NOTE — Progress Notes (Signed)
Daily Session Note  Patient Details  Name: James Yates MRN: [id number] Date of Birth: Feb 26, 1956 Referring Provider:     Cardiac Rehab from 05/07/2018 in [facility] Cardiac and Pulmonary Rehab  Referring Provider  [name] MD      Encounter Date: 05/24/2018  Check In:      Social History   Tobacco Use  Smoking Status Never Smoker  Smokeless Tobacco Never Used    Goals Met:  Independence with exercise equipment Exercise tolerated well No report of cardiac concerns or symptoms Strength training completed today  Goals Unmet:  Not Applicable  Comments: Pt able to follow exercise prescription today without complaint.  Will continue to monitor for progression.    Dr. Emily Filbert is Medical Director for [place] and [facility].

## 2018-05-28 DIAGNOSIS — Z952 Presence of prosthetic heart valve: Secondary | ICD-10-CM

## 2018-05-28 NOTE — Progress Notes (Signed)
Daily Session Note  Patient Details  Name: James Yates MRN: [id number] Date of Birth: 1955/12/03 Referring Provider:     Cardiac Rehab from 05/07/2018 in [facility] Cardiac and Pulmonary Rehab  Referring Provider  [name] MD      Encounter Date: 05/28/2018  Check In: Session Check In - 05/28/18 1720      Check-In   Supervising physician immediately available to respond to emergencies  See telemetry face sheet for immediately available ER MD    Location  [facility]-Cardiac & Pulmonary Rehab    Staff Present  Gerlene Burdock, RN, BSN;Jeanna Durrell BS, Exercise Physiologist;Amanda Oletta Darter, BA, ACSM CEP, Exercise Physiologist    Medication changes reported      No    Fall or balance concerns reported     No    Tobacco Cessation  No Change    Warm-up and Cool-down  Performed as group-led instruction    Resistance Training Performed  Yes    VAD Patient?  No    PAD/SET Patient?  No      Pain Assessment   Currently in Pain?  No/denies    Multiple Pain Sites  No          Social History   Tobacco Use  Smoking Status Never Smoker  Smokeless Tobacco Never Used    Goals Met:  Independence with exercise equipment Exercise tolerated well Personal goals reviewed No report of cardiac concerns or symptoms Strength training completed today  Goals Unmet:  Not Applicable  Comments: Pt able to follow exercise prescription today without complaint.  Will continue to monitor for progression. Reviewed home exercise with pt today.  Pt plans to walk at home and mall for exercise.  Reviewed THR, pulse, RPE, sign and symptoms, NTG use, and when to call 911 or MD.  Also discussed weather considerations and indoor options.  Pt voiced understanding.   Dr. Emily Filbert is Medical Director for [place] and [facility].

## 2018-05-29 ENCOUNTER — Encounter: Payer: Self-pay | Admitting: *Deleted

## 2018-05-29 DIAGNOSIS — Z952 Presence of prosthetic heart valve: Secondary | ICD-10-CM

## 2018-05-29 NOTE — Progress Notes (Signed)
Cardiac Individual Treatment Plan  Patient Details  Name: James Yates MRN: [id number] Date of Birth: 09/30/1955 Referring Provider:     Cardiac Rehab from 05/07/2018 in [facility] Cardiac and Pulmonary Rehab  Referring Provider  [name] MD      Initial Encounter Date:    Cardiac Rehab from 05/07/2018 in [facility] Cardiac and Pulmonary Rehab  Date  05/07/18      Visit Diagnosis: S/P TAVR (transcatheter aortic valve replacement)  Patient's Home Medications on Admission:  Current Outpatient Medications:  .  amLODipine (NORVASC) 5 MG tablet, TAKE 1 TABLET BY MOUTH EVERY DAY, Disp: 90 tablet, Rfl: 0 .  amoxicillin (AMOXIL) 500 MG tablet, Take 4 tablets (2,000 mg) one hour prior to dental visits., Disp: 4 tablet, Rfl: 10 .  aspirin EC 81 MG tablet, Take 1 tablet (81 mg total) by mouth daily., Disp: 30 tablet, Rfl: 3 .  atorvastatin (LIPITOR) 80 MG tablet, TAKE 1 TABLET (80 MG TOTAL) BY MOUTH DAILY AT 6 PM. (Patient taking differently: Take 80 mg by mouth daily at 6 PM. TAKE 1 TABLET (80 MG TOTAL) BY MOUTH DAILY AT 6 PM.), Disp: 90 tablet, Rfl: 3 .  clopidogrel (PLAVIX) 75 MG tablet, Take 1 tablet (75 mg total) by mouth daily with breakfast., Disp: 90 tablet, Rfl: 1 .  metoprolol tartrate (LOPRESSOR) 50 MG tablet, TAKE 1 TABLET BY MOUTH TWICE A DAY, Disp: 180 tablet, Rfl: 1 .  potassium chloride (K-DUR) 10 MEQ tablet, Take 1 tablet (10 mEq total) by mouth daily., Disp: 90 tablet, Rfl: 2  Past Medical History: Past Medical History:  Diagnosis Date  . Coronary artery disease    s/p CABG  . Glucose intolerance (impaired glucose tolerance)   . Hyperlipidemia   . Hypertension   . Obesity   . S/P TAVR (transcatheter aortic valve replacement)    26 mm Edwards Sapien 3 transcatheter heart valve placed via percutaneous right transfemoral approach   . Severe aortic stenosis     Tobacco Use: Social History   Tobacco Use  Smoking Status Never Smoker  Smokeless Tobacco Never Used     Labs: Recent Review Flowsheet Data    Labs for ITP Cardiac and Pulmonary Rehab Latest Ref Rng & Units 03/09/2018 03/13/2018 03/13/2018 03/13/2018 03/13/2018   Cholestrol 0 - 200 mg/dL - - - - -   LDLCALC 0 - 99 mg/dL - - - - -   HDL >40 mg/dL - - - - -   Trlycerides <150 mg/dL - - - - -   Hemoglobin A1c 4.8 - 5.6 % 6.3(H) - - - -   PHART 7.350 - 7.450 7.431 - - - 7.385   PCO2ART 32.0 - 48.0 mmHg 42.0 - - - 40.6   HCO3 20.0 - 28.0 mmol/L 27.4 - - - 24.4   TCO2 22 - 32 mmol/L - _0 ACIDBASEDEF 0.0 - 2.0 mmol/L - - - - 1.0   O2SAT % 92.4 - - - 100.0       Exercise Target Goals: Exercise Program Goal: Individual exercise prescription set using results from initial 6 min walk test and THRR while considering  patient's activity barriers and safety.   Exercise Prescription Goal: Initial exercise prescription builds to 30-45 minutes a day of aerobic activity, 2-3 days per week.  Home exercise guidelines will be given to patient during program as part of exercise prescription that the participant will acknowledge.  Activity Barriers & Risk Stratification: Activity Barriers & Cardiac Risk  Stratification - 05/07/18 1133      Activity Barriers & Cardiac Risk Stratification   Activity Barriers  Joint Problems;Deconditioning;Muscular Weakness;Shortness of Breath   Left knee hurts. advised to see primary MD to check his knee.  States no swelling, has been hurting for a few days. No history of injury.   Cardiac Risk Stratification  High       6 Minute Walk: 6 Minute Walk    Row Name 05/07/18 1259         6 Minute Walk   Phase  Initial     Distance  1155 feet     Walk Time  6 minutes     # of Rest Breaks  0     MPH  2.19     METS  2.81     RPE  9     Perceived Dyspnea   1     VO2 Peak  9.85     Symptoms  Yes (comment)     Comments  SOB, knee pain 4/10     Resting HR  77 bpm     Resting BP  136/64     Resting Oxygen Saturation   98 %     Exercise Oxygen  Saturation  during 6 min walk  100 %     Max Ex. HR  98 bpm     Max Ex. BP  138/76     2 Minute Post BP  128/62        Oxygen Initial Assessment:   Oxygen Re-Evaluation:   Oxygen Discharge (Final Oxygen Re-Evaluation):   Initial Exercise Prescription: Initial Exercise Prescription - 05/07/18 1300      Date of Initial Exercise RX and Referring Provider   Date  05/07/18    Referring Provider  [name] MD      Treadmill   MPH  2.1    Grade  0.5    Minutes  15    METs  2.75      NuStep   Level  2    SPM  80    Minutes  15    METs  2.5      Recumbant Elliptical   Level  1    RPM  50    Minutes  15    METs  2.5      Prescription Details   Frequency (times per week)  3    Duration  Progress to 45 minutes of aerobic exercise without signs/symptoms of physical distress      Intensity   THRR 40-80% of Max Heartrate  110-143    Ratings of Perceived Exertion  11-13    Perceived Dyspnea  0-4      Progression   Progression  Continue to progress workloads to maintain intensity without signs/symptoms of physical distress.      Resistance Training   Training Prescription  Yes    Weight  3 lbs    Reps  10-15       Perform Capillary Blood Glucose checks as needed.  Exercise Prescription Changes: Exercise Prescription Changes    Row Name 05/07/18 1300 05/22/18 1100 05/28/18 1700         Response to Exercise   Blood Pressure (Admit)  136/64  110/70  -     Blood Pressure (Exercise)  138/76  138/70  -     Blood Pressure (Exit)  128/62  132/80  -     Heart Rate (Admit)  77 bpm  91 bpm  -     Heart Rate (Exercise)  98 bpm  113 bpm  -     Heart Rate (Exit)  83 bpm  90 bpm  -     Oxygen Saturation (Admit)  98 %  -  -     Oxygen Saturation (Exercise)  100 %  -  -     Rating of Perceived Exertion (Exercise)  9  14  -     Perceived Dyspnea (Exercise)  1  -  -     Symptoms  SOB, knee pain 4/10  none  -     Comments  walk test results  -  -     Duration  -   Continue with 45 min of aerobic exercise without signs/symptoms of physical distress.  -     Intensity  -  THRR unchanged  -       Progression   Progression  -  Continue to progress workloads to maintain intensity without signs/symptoms of physical distress.  -     Average METs  -  2.5  -       Resistance Training   Training Prescription  -  Yes  -     Weight  -  3 lb  -     Reps  -  10-15  -       Treadmill   MPH  -  2  -     Grade  -  0.5  -     Minutes  -  15  -     METs  -  2.67  -       NuStep   Level  -  2  -     SPM  -  80  -     Minutes  -  15  -     METs  -  3  -       Recumbant Elliptical   Level  -  1.5  -     RPM  -  50  -     Minutes  -  15  -     METs  -  1.8  -       Home Exercise Plan   Plans to continue exercise at  -  -  Home (comment) walking at home and mall     Frequency  -  -  Add 2 additional days to program exercise sessions.     Initial Home Exercises Provided  -  -  05/28/18        Exercise Comments: Exercise Comments    Row Name 05/10/18 1619           Exercise Comments  First full day of exercise!  Patient was oriented to gym and equipment including functions, settings, policies, and procedures.  Patient's individual exercise prescription and treatment plan were reviewed.  All starting workloads were established based on the results of the 6 minute walk test done at initial orientation visit.  The plan for exercise progression was also introduced and progression will be customized based on patient's performance and goals.          Exercise Goals and Review: Exercise Goals    Row Name 05/07/18 1302             Exercise Goals   Increase Physical Activity  Yes       Intervention  Provide advice, education, support and counseling about physical  activity/exercise needs.;Develop an individualized exercise prescription for aerobic and resistive training based on initial evaluation findings, risk stratification, comorbidities and  participant's personal goals.       Expected Outcomes  Short Term: Attend rehab on a regular basis to increase amount of physical activity.;Long Term: Add in home exercise to make exercise part of routine and to increase amount of physical activity.;Long Term: Exercising regularly at least 3-5 days a week.       Increase Strength and Stamina  Yes       Intervention  Provide advice, education, support and counseling about physical activity/exercise needs.;Develop an individualized exercise prescription for aerobic and resistive training based on initial evaluation findings, risk stratification, comorbidities and participant's personal goals.       Expected Outcomes  Short Term: Increase workloads from initial exercise prescription for resistance, speed, and METs.;Short Term: Perform resistance training exercises routinely during rehab and add in resistance training at home;Long Term: Improve cardiorespiratory fitness, muscular endurance and strength as measured by increased METs and functional capacity (6MWT)       Able to understand and use rate of perceived exertion (RPE) scale  Yes       Intervention  Provide education and explanation on how to use RPE scale       Expected Outcomes  Short Term: Able to use RPE daily in rehab to express subjective intensity level;Long Term:  Able to use RPE to guide intensity level when exercising independently       Knowledge and understanding of Target Heart Rate Range (THRR)  Yes       Intervention  Provide education and explanation of THRR including how the numbers were predicted and where they are located for reference       Expected Outcomes  Short Term: Able to state/look up THRR;Short Term: Able to use daily as guideline for intensity in rehab;Long Term: Able to use THRR to govern intensity when exercising independently       Able to check pulse independently  Yes       Intervention  Provide education and demonstration on how to check pulse in carotid and radial  arteries.;Review the importance of being able to check your own pulse for safety during independent exercise       Expected Outcomes  Short Term: Able to explain why pulse checking is important during independent exercise;Long Term: Able to check pulse independently and accurately       Understanding of Exercise Prescription  Yes       Intervention  Provide education, explanation, and written materials on patient's individual exercise prescription       Expected Outcomes  Short Term: Able to explain program exercise prescription;Long Term: Able to explain home exercise prescription to exercise independently          Exercise Goals Re-Evaluation : Exercise Goals Re-Evaluation    Row Name 05/10/18 1619 05/22/18 1103 05/28/18 1724         Exercise Goal Re-Evaluation   Exercise Goals Review  Able to understand and use rate of perceived exertion (RPE) scale;Knowledge and understanding of Target Heart Rate Range (THRR);Understanding of Exercise Prescription  Increase Physical Activity;Increase Strength and Stamina;Able to understand and use rate of perceived exertion (RPE) scale  Increase Physical Activity;Able to understand and use rate of perceived exertion (RPE) scale;Knowledge and understanding of Target Heart Rate Range (THRR);Understanding of Exercise Prescription;Increase Strength and Stamina;Able to check pulse independently     Comments  Reviewed RPE scale, THR and program  prescription with pt today.  Pt voiced understanding and was given a copy of goals to take home.   Parley has tolerated exercsie well so far.  He can complete 45 min continuous without symptoms.  Staff will monitor progress.  Reviewed home exercise with pt today.  Pt plans to walk at home and mall for exercise.  Reviewed THR, pulse, RPE, sign and symptoms, NTG use, and when to call 911 or MD.  Also discussed weather considerations and indoor options.  Pt voiced understanding.     Expected Outcomes  Short: Use RPE daily to  regulate intensity. Long: Follow program prescription in THR.  Short - attend class consistently Long - increase overall MET level  Short- add 2 days of walking at home. Long- Exercise 5 days per week even after completion of program.        Discharge Exercise Prescription (Final Exercise Prescription Changes): Exercise Prescription Changes - 05/28/18 1700      Home Exercise Plan   Plans to continue exercise at  Home (comment)   walking at home and mall   Frequency  Add 2 additional days to program exercise sessions.    Initial Home Exercises Provided  05/28/18       Nutrition:  Target Goals: Understanding of nutrition guidelines, daily intake of sodium '1500mg'$ , cholesterol '200mg'$ , calories 30% from fat and 7% or less from saturated fats, daily to have 5 or more servings of fruits and vegetables.  Biometrics: Pre Biometrics - 05/07/18 1303      Pre Biometrics   Height  5' 4.5" (1.638 m)    Weight  196 lb 6.4 oz (89.1 kg)    Waist Circumference  41 inches    Hip Circumference  40.75 inches    Waist to Hip Ratio  1.01 %    BMI (Calculated)  33.2    Single Leg Stand  14.34 seconds        Nutrition Therapy Plan and Nutrition Goals: Nutrition Therapy & Goals - 05/14/18 1654      Nutrition Therapy   Diet  TLC    Drug/Food Interactions  Statins/Certain Fruits    Protein (specify units)  8-9oz    Fiber  35 grams    Whole Grain Foods  3 servings   does not typically choose whole grains   Saturated Fats  15 max. grams    Fruits and Vegetables  4 servings/day   8 ideal; eats fruits but does not usually eat vegetables   Sodium  1500 grams      Personal Nutrition Goals   Nutrition Goal  At breakfast, add a protein source to your sweet bread such as an egg or [place] bacon, rather than eating the sweet bread on its own    Personal Goal #2  At meal times, try to include a vegetable at least one more day per week, with a long-term goal of having a larger portion of vegetables on your  plate than starch (rice)    Personal Goal #3  Great job for starting to choose leaner meats (less pork/red meat)!    Personal Goal #4  Drink one less glass of juice per day, and replace it with water    Comments  HgbA1c (02/2018) 6.3 H. He reports to be unaware of this number. Typically eats 1 meal/day + a sweet bread and coffee with milk in the morning. For dinner he eats at home half of the time and out at restaurants half of the time. He  feels that he "eats too much rice" and eats very few vegetables. Since his surgery he has been eating less red meat and pork, more chicken, which he bakes/grills and sometimes fries. Beverages: coffee, juice, water. He thinks he has been drinking more juice as of late. Snacks: fruit, [place] sandwich.      Intervention Plan   Intervention  Prescribe, educate and counsel regarding individualized specific dietary modifications aiming towards targeted core components such as weight, hypertension, lipid management, diabetes, heart failure and other comorbidities.    Expected Outcomes  Short Term Goal: Understand basic principles of dietary content, such as calories, fat, sodium, cholesterol and nutrients.;Long Term Goal: Adherence to prescribed nutrition plan.;Short Term Goal: A plan has been developed with personal nutrition goals set during dietitian appointment.       Nutrition Assessments: Nutrition Assessments - 05/07/18 1142      MEDFICTS Scores   Pre Score  36       Nutrition Goals Re-Evaluation: Nutrition Goals Re-Evaluation    Row Name 05/14/18 1706             Goals   Nutrition Goal  At breakfast, add a protein source to your sweet bread such as an egg or [place] bacon, rather than eating the sweet bread on its own       Comment  He has been eating sweet breads with coffee and juice for breakfast lately; last A1c 6.3       Expected Outcome  He will add a protein source to his breakfast, or choose a whole grain bread option rather than a 'sweet  bread.'         Personal Goal #2 Re-Evaluation   Personal Goal #2  At meal times, try to include a vegetable at least one more day per week, with a long term goal of having a larger portion of vegetables on your plate than starch (rice) Plate method reviewed         Personal Goal #3 Re-Evaluation   Personal Goal #3  Great job for starting to choose leaner meats (less pork/ red meats!)         Personal Goal #4 Re-Evaluation   Personal Goal #4  Drink one less glass of juice per day, and replace it with water          Nutrition Goals Discharge (Final Nutrition Goals Re-Evaluation): Nutrition Goals Re-Evaluation - 05/14/18 1706      Goals   Nutrition Goal  At breakfast, add a protein source to your sweet bread such as an egg or [place] bacon, rather than eating the sweet bread on its own    Comment  He has been eating sweet breads with coffee and juice for breakfast lately; last A1c 6.3    Expected Outcome  He will add a protein source to his breakfast, or choose a whole grain bread option rather than a 'sweet bread.'      Personal Goal #2 Re-Evaluation   Personal Goal #2  At meal times, try to include a vegetable at least one more day per week, with a long term goal of having a larger portion of vegetables on your plate than starch (rice)   Plate method reviewed     Personal Goal #3 Re-Evaluation   Personal Goal #3  Great job for starting to choose leaner meats (less pork/ red meats!)      Personal Goal #4 Re-Evaluation   Personal Goal #4  Drink one less glass of juice per  day, and replace it with water       Psychosocial: Target Goals: Acknowledge presence or absence of significant depression and/or stress, maximize coping skills, provide positive support system. Participant is able to verbalize types and ability to use techniques and skills needed for reducing stress and depression.   Initial Review & Psychosocial Screening: Initial Psych Review & Screening - 05/07/18 1134       Initial Review   Current issues with  None Identified      Family Dynamics   Good Support System?  Yes   Wife and children     Barriers   Psychosocial barriers to participate in program  There are no identifiable barriers or psychosocial needs.;The patient should benefit from training in stress management and relaxation.      Screening Interventions   Interventions  Encouraged to exercise;Program counselor consult;To provide support and resources with identified psychosocial needs;Provide feedback about the scores to participant    Expected Outcomes  Short Term goal: Utilizing psychosocial counselor, staff and physician to assist with identification of specific Stressors or current issues interfering with healing process. Setting desired goal for each stressor or current issue identified.;Long Term Goal: Stressors or current issues are controlled or eliminated.;Short Term goal: Identification and review with participant of any Quality of Life or Depression concerns found by scoring the questionnaire.;Long Term goal: The participant improves quality of Life and PHQ9 Scores as seen by post scores and/or verbalization of changes       Quality of Life Scores:  Quality of Life - 05/07/18 1134      Quality of Life   Select  Quality of Life      Quality of Life Scores   Health/Function Pre  25.5 %    Socioeconomic Pre  23.25 %    Psych/Spiritual Pre  24.58 %    Family Pre  28.8 %    GLOBAL Pre  25.29 %      Scores of 19 and below usually indicate a poorer quality of life in these areas.  A difference of  2-3 points is a clinically meaningful difference.  A difference of 2-3 points in the total score of the Quality of Life Index has been associated with significant improvement in overall quality of life, self-image, physical symptoms, and general health in studies assessing change in quality of life.  PHQ-9: Recent Review Flowsheet Data    Depression screen [facility] 2/9 05/07/2018   Decreased  Interest 3   Down, Depressed, Hopeless 1   PHQ - 2 Score 4   Altered sleeping 1   Tired, decreased energy 1   Change in appetite 2   Feeling bad or failure about yourself  0   Trouble concentrating 0   Moving slowly or fidgety/restless 2   Suicidal thoughts 0   PHQ-9 Score 10   Difficult doing work/chores Somewhat difficult     Interpretation of Total Score  Total Score Depression Severity:  1-4 = Minimal depression, 5-9 = Mild depression, 10-14 = Moderate depression, 15-19 = Moderately severe depression, 20-27 = Severe depression   Psychosocial Evaluation and Intervention: Psychosocial Evaluation - 05/14/18 1725      Psychosocial Evaluation & Interventions   Interventions  Stress management education;Relaxation education;Encouraged to exercise with the program and follow exercise prescription    Comments  Counselor met with Mr. Maland Dhondt) today for initial psychosocial evaluation.  He was accompanied by an interpreter since Rossie is hispanic.  He is a 62 year old who  had open heart surgery and a heart cathe on 03/14/18.  He has a strong support system with a spouse of 30 years and (3) adult children locally.  He reports sleeping only about 5 hours/night and has a good appetite.  He denies a history of depression or anxiety or any current symptoms; but admits that he "comfort eats" at times when he is a little anxious.  Savian states his mood is generally positive most of the time.  He has some stress currently with his 75 year old father who is sick and 59 year old mother being so far away - in [place].  He is also concerned about his own health.  Juddson has goals to have a stronger heart and possibly lose some weight while in this program.  He will be followed by staff.     Expected Outcomes  Short:   Deloy will meet with the dietician to address his weight loss goal.  He will also exercise for stress reduction and for his physical health.  Long:  Jdyn will develop a routine of  positive self-care practicies for his health and his mental health.     Continue Psychosocial Services   Follow up required by staff       Psychosocial Re-Evaluation:   Psychosocial Discharge (Final Psychosocial Re-Evaluation):   Vocational Rehabilitation: Provide vocational rehab assistance to qualifying candidates.   Vocational Rehab Evaluation & Intervention: Vocational Rehab - 05/07/18 1143      Initial Vocational Rehab Evaluation & Intervention   Assessment shows need for Vocational Rehabilitation  No       Education: Education Goals: Education classes will be provided on a variety of topics geared toward better understanding of heart health and risk factor modification. Participant will state understanding/return demonstration of topics presented as noted by education test scores.  Learning Barriers/Preferences: Learning Barriers/Preferences - 05/07/18 1142      Learning Barriers/Preferences   Learning Barriers  Sight;Language   interpreter to be at class   Learning Preferences  Skilled Demonstration       Education Topics:  AED/CPR: - Group verbal and written instruction with the use of models to demonstrate the basic use of the AED with the basic ABC's of resuscitation.   Cardiac Rehab from 05/28/2018 in [facility] Cardiac and Pulmonary Rehab  Date  05/28/18  Educator  CE  Instruction Review Code  1- Verbalizes Understanding      General Nutrition Guidelines/Fats and Fiber: -Group instruction provided by verbal, written material, models and posters to present the general guidelines for heart healthy nutrition. Gives an explanation and review of dietary fats and fiber.   Controlling Sodium/Reading Food Labels: -Group verbal and written material supporting the discussion of sodium use in heart healthy nutrition. Review and explanation with models, verbal and written materials for utilization of the food label.   Exercise Physiology & General Exercise  Guidelines: - Group verbal and written instruction with models to review the exercise physiology of the cardiovascular system and associated critical values. Provides general exercise guidelines with specific guidelines to those with heart or lung disease.    Aerobic Exercise & Resistance Training: - Gives group verbal and written instruction on the various components of exercise. Focuses on aerobic and resistive training programs and the benefits of this training and how to safely progress through these programs..   Flexibility, Balance, Mind/Body Relaxation: Provides group verbal/written instruction on the benefits of flexibility and balance training, including mind/body exercise modes such as yoga, pilates and tai  chi.  Demonstration and skill practice provided.   Stress and Anxiety: - Provides group verbal and written instruction about the health risks of elevated stress and causes of high stress.  Discuss the correlation between heart/lung disease and anxiety and treatment options. Review healthy ways to manage with stress and anxiety.   Depression: - Provides group verbal and written instruction on the correlation between heart/lung disease and depressed mood, treatment options, and the stigmas associated with seeking treatment.   Anatomy & Physiology of the Heart: - Group verbal and written instruction and models provide basic cardiac anatomy and physiology, with the coronary electrical and arterial systems. Review of Valvular disease and Heart Failure   Cardiac Procedures: - Group verbal and written instruction to review commonly prescribed medications for heart disease. Reviews the medication, class of the drug, and side effects. Includes the steps to properly store meds and maintain the prescription regimen. (beta blockers and nitrates)   Cardiac Medications I: - Group verbal and written instruction to review commonly prescribed medications for heart disease. Reviews the  medication, class of the drug, and side effects. Includes the steps to properly store meds and maintain the prescription regimen.   Cardiac Medications II: -Group verbal and written instruction to review commonly prescribed medications for heart disease. Reviews the medication, class of the drug, and side effects. (all other drug classes)    Go Sex-Intimacy & Heart Disease, Get SMART - Goal Setting: - Group verbal and written instruction through game format to discuss heart disease and the return to sexual intimacy. Provides group verbal and written material to discuss and apply goal setting through the application of the S.M.A.R.T. Method.   Other Matters of the Heart: - Provides group verbal, written materials and models to describe Stable Angina and Peripheral Artery. Includes description of the disease process and treatment options available to the cardiac patient.   Exercise & Equipment Safety: - Individual verbal instruction and demonstration of equipment use and safety with use of the equipment.   Cardiac Rehab from 05/28/2018 in [facility] Cardiac and Pulmonary Rehab  Date  05/07/18  Educator  SB  Instruction Review Code  1- Verbalizes Understanding      Infection Prevention: - Provides verbal and written material to individual with discussion of infection control including proper hand washing and proper equipment cleaning during exercise session.   Cardiac Rehab from 05/28/2018 in [facility] Cardiac and Pulmonary Rehab  Date  05/07/18  Educator  SB  Instruction Review Code  1- Verbalizes Understanding      Falls Prevention: - Provides verbal and written material to individual with discussion of falls prevention and safety.   Cardiac Rehab from 05/28/2018 in [facility] Cardiac and Pulmonary Rehab  Date  05/07/18  Educator  SB  Instruction Review Code  1- Verbalizes Understanding      Diabetes: - Individual verbal and written instruction to review signs/symptoms of diabetes, desired  ranges of glucose level fasting, after meals and with exercise. Acknowledge that pre and post exercise glucose checks will be done for 3 sessions at entry of program.   Know Your Numbers and Risk Factors: -Group verbal and written instruction about important numbers in your health.  Discussion of what are risk factors and how they play a role in the disease process.  Review of Cholesterol, Blood Pressure, Diabetes, and BMI and the role they play in your overall health.   Sleep Hygiene: -Provides group verbal and written instruction about how sleep can affect your health.  Define  sleep hygiene, discuss sleep cycles and impact of sleep habits. Review good sleep hygiene tips.    Other: -Provides group and verbal instruction on various topics (see comments)   Knowledge Questionnaire Score: Knowledge Questionnaire Score - 05/07/18 1142      Knowledge Questionnaire Score   Pre Score  24/26   Reviewed correct responses today with Tavoris and his family member. Verbalized understanding and no questions were asked      Core Components/Risk Factors/Patient Goals at Admission: Personal Goals and Risk Factors at Admission - 05/07/18 1143      Core Components/Risk Factors/Patient Goals on Admission    Weight Management  Yes;Weight Loss    Admit Weight  196 lb 6.4 oz (89.1 kg)    Goal Weight: Short Term  194 lb (88 kg)    Goal Weight: Long Term  180 lb (81.6 kg)    Expected Outcomes  Short Term: Continue to assess and modify interventions until short term weight is achieved;Long Term: Adherence to nutrition and physical activity/exercise program aimed toward attainment of established weight goal;Weight Loss: Understanding of general recommendations for a balanced deficit meal plan, which promotes 1-2 lb weight loss per week and includes a negative energy balance of [phone] kcal/d    Hypertension  Yes    Intervention  Provide education on lifestyle modifcations including regular physical  activity/exercise, weight management, moderate sodium restriction and increased consumption of fresh fruit, vegetables, and low fat dairy, alcohol moderation, and smoking cessation.;Monitor prescription use compliance.    Expected Outcomes  Short Term: Continued assessment and intervention until BP is < 140/54m HG in hypertensive participants. < 130/825mHG in hypertensive participants with diabetes, heart failure or chronic kidney disease.;Long Term: Maintenance of blood pressure at goal levels.    Lipids  Yes    Intervention  Provide education and support for participant on nutrition & aerobic/resistive exercise along with prescribed medications to achieve LDL '70mg'$ , HDL >'40mg'$ .    Expected Outcomes  Short Term: Participant states understanding of desired cholesterol values and is compliant with medications prescribed. Participant is following exercise prescription and nutrition guidelines.;Long Term: Cholesterol controlled with medications as prescribed, with individualized exercise RX and with personalized nutrition plan. Value goals: LDL < '70mg'$ , HDL > 40 mg.       Core Components/Risk Factors/Patient Goals Review:    Core Components/Risk Factors/Patient Goals at Discharge (Final Review):    ITP Comments: ITP Comments    Row Name 05/07/18 1305 05/29/18 0658         ITP Comments  Medical evaluation completed with interpreter present.  [facility]  Initial ITP created and sent for review and signature to Dr. MaEmily FilbertMedical Director.   30 Day Review. Continue with ITP unless directed changes per Medical Director review. New to program.         Comments:

## 2018-05-31 ENCOUNTER — Encounter: Payer: BLUE CROSS/BLUE SHIELD | Attending: Cardiovascular Disease

## 2018-05-31 DIAGNOSIS — Z7982 Long term (current) use of aspirin: Secondary | ICD-10-CM | POA: Insufficient documentation

## 2018-05-31 DIAGNOSIS — Z79899 Other long term (current) drug therapy: Secondary | ICD-10-CM | POA: Insufficient documentation

## 2018-05-31 DIAGNOSIS — I2510 Atherosclerotic heart disease of native coronary artery without angina pectoris: Secondary | ICD-10-CM | POA: Insufficient documentation

## 2018-05-31 DIAGNOSIS — I10 Essential (primary) hypertension: Secondary | ICD-10-CM | POA: Diagnosis not present

## 2018-05-31 DIAGNOSIS — Z7902 Long term (current) use of antithrombotics/antiplatelets: Secondary | ICD-10-CM | POA: Insufficient documentation

## 2018-05-31 DIAGNOSIS — Z951 Presence of aortocoronary bypass graft: Secondary | ICD-10-CM | POA: Insufficient documentation

## 2018-05-31 DIAGNOSIS — Z952 Presence of prosthetic heart valve: Secondary | ICD-10-CM | POA: Insufficient documentation

## 2018-05-31 DIAGNOSIS — E785 Hyperlipidemia, unspecified: Secondary | ICD-10-CM | POA: Diagnosis not present

## 2018-05-31 NOTE — Progress Notes (Signed)
Daily Session Note  Patient Details  Name: Shaymus Eveleth MRN: [id number] Date of Birth: 12-04-1955 Referring Provider:     Cardiac Rehab from 05/07/2018 in [facility] Cardiac and Pulmonary Rehab  Referring Provider  [name] MD      Encounter Date: 05/31/2018  Check In: Session Check In - 05/31/18 1617      Check-In   Supervising physician immediately available to respond to emergencies  See telemetry face sheet for immediately available ER MD    Location  [facility]-Cardiac & Pulmonary Rehab    Staff Present  Jasper Loser BS, Exercise Physiologist;Meredith Sherryll Burger, RN BSN;Tonna Palazzi RCP,RRT,BSRT    Medication changes reported      No    Fall or balance concerns reported     No    Warm-up and Cool-down  Performed as group-led instruction    Resistance Training Performed  Yes    VAD Patient?  No    PAD/SET Patient?  No      Pain Assessment   Currently in Pain?  No/denies          Social History   Tobacco Use  Smoking Status Never Smoker  Smokeless Tobacco Never Used    Goals Met:  Independence with exercise equipment Exercise tolerated well No report of cardiac concerns or symptoms Strength training completed today  Goals Unmet:  Not Applicable  Comments: Pt able to follow exercise prescription today without complaint.  Will continue to monitor for progression.    Dr. Emily Filbert is Medical Director for [place] and [facility].

## 2018-06-04 DIAGNOSIS — Z952 Presence of prosthetic heart valve: Secondary | ICD-10-CM

## 2018-06-04 NOTE — Progress Notes (Signed)
Daily Session Note  Patient Details  Name: Jeffery Bachmeier MRN: [id number] Date of Birth: 04/24/1956 Referring Provider:     Cardiac Rehab from 05/07/2018 in [facility] Cardiac and Pulmonary Rehab  Referring Provider  [name] MD      Encounter Date: 06/04/2018  Check In: Session Check In - 06/04/18 1718      Check-In   Supervising physician immediately available to respond to emergencies  See telemetry face sheet for immediately available ER MD    Location  [facility]-Cardiac & Pulmonary Rehab    Staff Present  Gerlene Burdock, RN, Moises Blood, BS, ACSM CEP, Exercise Physiologist;Amanda Oletta Darter, [place], ACSM CEP, Exercise Physiologist    Medication changes reported      No    Fall or balance concerns reported     No    Warm-up and Cool-down  Performed as group-led instruction    Resistance Training Performed  Yes    VAD Patient?  No    PAD/SET Patient?  No      Pain Assessment   Currently in Pain?  No/denies    Multiple Pain Sites  No          Social History   Tobacco Use  Smoking Status Never Smoker  Smokeless Tobacco Never Used    Goals Met:  Independence with exercise equipment Exercise tolerated well No report of cardiac concerns or symptoms Strength training completed today  Goals Unmet:  Not Applicable  Comments: Pt able to follow exercise prescription today without complaint.  Will continue to monitor for progression.    Dr. Emily Filbert is Medical Director for [place] and [facility].

## 2018-06-06 ENCOUNTER — Encounter: Payer: BLUE CROSS/BLUE SHIELD | Admitting: *Deleted

## 2018-06-06 DIAGNOSIS — Z952 Presence of prosthetic heart valve: Secondary | ICD-10-CM | POA: Diagnosis not present

## 2018-06-06 NOTE — Progress Notes (Signed)
Daily Session Note  Patient Details  Name: Malaki Koury MRN: [id number] Date of Birth: 10/19/1955 Referring Provider:     Cardiac Rehab from 05/07/2018 in [facility] Cardiac and Pulmonary Rehab  Referring Provider  [name] MD      Encounter Date: 06/06/2018  Check In: Session Check In - 06/06/18 1620      Check-In   Supervising physician immediately available to respond to emergencies  See telemetry face sheet for immediately available ER MD    Location  [facility]-Cardiac & Pulmonary Rehab    Staff Present  Renita Papa, RN BSN;Carroll Enterkin, RN, Vickki Hearing, BA, ACSM CEP, Exercise Physiologist    Medication changes reported      No    Fall or balance concerns reported     No    Tobacco Cessation  No Change    Warm-up and Cool-down  Performed as group-led instruction    Resistance Training Performed  Yes    VAD Patient?  No    PAD/SET Patient?  No      Pain Assessment   Currently in Pain?  No/denies          Social History   Tobacco Use  Smoking Status Never Smoker  Smokeless Tobacco Never Used    Goals Met:  Independence with exercise equipment Exercise tolerated well No report of cardiac concerns or symptoms Strength training completed today  Goals Unmet:  Not Applicable  Comments: Pt able to follow exercise prescription today without complaint.  Will continue to monitor for progression.    Dr. Emily Filbert is Medical Director for [place] and [facility].

## 2018-06-07 DIAGNOSIS — Z952 Presence of prosthetic heart valve: Secondary | ICD-10-CM | POA: Diagnosis not present

## 2018-06-07 NOTE — Progress Notes (Signed)
Daily Session Note  Patient Details  Name: James Yates MRN: [id number] Date of Birth: Mar 30, 1956 Referring Provider:     Cardiac Rehab from 05/07/2018 in [facility] Cardiac and Pulmonary Rehab  Referring Provider  [name] MD      Encounter Date: 06/07/2018  Check In: Session Check In - 06/07/18 1614      Check-In   Supervising physician immediately available to respond to emergencies  See telemetry face sheet for immediately available ER MD    Location  [facility]-Cardiac & Pulmonary Rehab    Staff Present  Renita Papa, RN Moises Blood, BS, ACSM CEP, Exercise Physiologist;Larrissa Stivers Tessie Fass RCP,RRT,BSRT    Medication changes reported      No    Fall or balance concerns reported     No    Warm-up and Cool-down  Performed as group-led instruction    Resistance Training Performed  Yes    VAD Patient?  No    PAD/SET Patient?  No      Pain Assessment   Currently in Pain?  No/denies          Social History   Tobacco Use  Smoking Status Never Smoker  Smokeless Tobacco Never Used    Goals Met:  Independence with exercise equipment Exercise tolerated well No report of cardiac concerns or symptoms Strength training completed today  Goals Unmet:  Not Applicable  Comments: Pt able to follow exercise prescription today without complaint.  Will continue to monitor for progression.    Dr. Emily Filbert is Medical Director for [place] and [facility].

## 2018-06-11 ENCOUNTER — Encounter: Payer: BLUE CROSS/BLUE SHIELD | Admitting: *Deleted

## 2018-06-11 DIAGNOSIS — Z952 Presence of prosthetic heart valve: Secondary | ICD-10-CM

## 2018-06-11 NOTE — Progress Notes (Signed)
Daily Session Note  Patient Details  Name: James Yates MRN: [id number] Date of Birth: 1956/02/16 Referring Provider:     Cardiac Rehab from 05/07/2018 in [facility] Cardiac and Pulmonary Rehab  Referring Provider  [name] MD      Encounter Date: 06/11/2018  Check In: Session Check In - 06/11/18 [place]      Check-In   Supervising physician immediately available to respond to emergencies  See telemetry face sheet for immediately available ER MD    Location  [facility]-Cardiac & Pulmonary Rehab    Staff Present  Earlean Shawl, BS, ACSM CEP, Exercise Physiologist;Carroll Enterkin, RN, Vickki Hearing, BA, ACSM CEP, Exercise Physiologist    Medication changes reported      No    Fall or balance concerns reported     No    Tobacco Cessation  No Change    Warm-up and Cool-down  Performed as group-led instruction    Resistance Training Performed  Yes    VAD Patient?  No    PAD/SET Patient?  No      Pain Assessment   Currently in Pain?  No/denies    Multiple Pain Sites  No          Social History   Tobacco Use  Smoking Status Never Smoker  Smokeless Tobacco Never Used    Goals Met:  Independence with exercise equipment Exercise tolerated well No report of cardiac concerns or symptoms Strength training completed today  Goals Unmet:  Not Applicable  Comments: Pt able to follow exercise prescription today without complaint.  Will continue to monitor for progression.    Dr. Emily Filbert is Medical Director for [place] and [facility].

## 2018-06-13 ENCOUNTER — Encounter: Payer: BLUE CROSS/BLUE SHIELD | Admitting: *Deleted

## 2018-06-13 DIAGNOSIS — Z952 Presence of prosthetic heart valve: Secondary | ICD-10-CM

## 2018-06-13 NOTE — Progress Notes (Signed)
Daily Session Note  Patient Details  Name: James Yates MRN: [id number] Date of Birth: Feb 02, 1956 Referring Provider:     Cardiac Rehab from 05/07/2018 in [facility] Cardiac and Pulmonary Rehab  Referring Provider  [name] MD      Encounter Date: 06/13/2018  Check In: Session Check In - 06/13/18 1653      Check-In   Supervising physician immediately available to respond to emergencies  See telemetry face sheet for immediately available ER MD    Location  [facility]-Cardiac & Pulmonary Rehab    Staff Present  Renita Papa, RN BSN;Carroll Enterkin, RN, Vickki Hearing, BA, ACSM CEP, Exercise Physiologist    Medication changes reported      No    Fall or balance concerns reported     No    Tobacco Cessation  No Change    Warm-up and Cool-down  Performed as group-led instruction    Resistance Training Performed  Yes    VAD Patient?  No    PAD/SET Patient?  No      Pain Assessment   Currently in Pain?  No/denies          Social History   Tobacco Use  Smoking Status Never Smoker  Smokeless Tobacco Never Used    Goals Met:  Independence with exercise equipment Exercise tolerated well No report of cardiac concerns or symptoms Strength training completed today  Goals Unmet:  Not Applicable  Comments: Pt able to follow exercise prescription today without complaint.  Will continue to monitor for progression.    Dr. Emily Filbert is Medical Director for [place] and [facility].

## 2018-06-14 ENCOUNTER — Other Ambulatory Visit: Payer: Self-pay | Admitting: Cardiovascular Disease

## 2018-06-14 DIAGNOSIS — Z952 Presence of prosthetic heart valve: Secondary | ICD-10-CM | POA: Diagnosis not present

## 2018-06-14 NOTE — Progress Notes (Signed)
Daily Session Note  Patient Details  Name: James Yates MRN: [id number] Date of Birth: 1955-12-06 Referring Provider:     Cardiac Rehab from 05/07/2018 in [facility] Cardiac and Pulmonary Rehab  Referring Provider  [name] MD      Encounter Date: 06/14/2018  Check In: Session Check In - 06/14/18 1636      Check-In   Supervising physician immediately available to respond to emergencies  See telemetry face sheet for immediately available ER MD    Location  [facility]-Cardiac & Pulmonary Rehab    Staff Present  Renita Papa, RN BSN;Joseph [address] [place], [place], ACSM CEP, Exercise Physiologist    Medication changes reported      No    Fall or balance concerns reported     No    Warm-up and Cool-down  Performed as group-led instruction    Resistance Training Performed  Yes    VAD Patient?  No    PAD/SET Patient?  No      Pain Assessment   Currently in Pain?  No/denies          Social History   Tobacco Use  Smoking Status Never Smoker  Smokeless Tobacco Never Used    Goals Met:  Independence with exercise equipment Exercise tolerated well No report of cardiac concerns or symptoms Strength training completed today  Goals Unmet:  Not Applicable  Comments: Pt able to follow exercise prescription today without complaint.  Will continue to monitor for progression.    Dr. Emily Filbert is Medical Director for [place] and [facility].

## 2018-06-18 DIAGNOSIS — Z952 Presence of prosthetic heart valve: Secondary | ICD-10-CM | POA: Diagnosis not present

## 2018-06-18 NOTE — Progress Notes (Signed)
Daily Session Note  Patient Details  Name: James Yates MRN: [id number] Date of Birth: 01-08-1956 Referring Provider:     Cardiac Rehab from 05/07/2018 in [facility] Cardiac and Pulmonary Rehab  Referring Provider  [name] MD      Encounter Date: 06/18/2018  Check In: Session Check In - 06/18/18 1732      Check-In   Supervising physician immediately available to respond to emergencies  See telemetry face sheet for immediately available ER MD    Location  [facility]-Cardiac & Pulmonary Rehab    Staff Present  Gerlene Burdock, RN, Moises Blood, BS, ACSM CEP, Exercise Physiologist;Amanda Oletta Darter, [place], ACSM CEP, Exercise Physiologist    Medication changes reported      No    Fall or balance concerns reported     No    Warm-up and Cool-down  Performed as group-led instruction    Resistance Training Performed  Yes    VAD Patient?  No    PAD/SET Patient?  No      Pain Assessment   Currently in Pain?  No/denies    Multiple Pain Sites  No          Social History   Tobacco Use  Smoking Status Never Smoker  Smokeless Tobacco Never Used    Goals Met:  Independence with exercise equipment Exercise tolerated well No report of cardiac concerns or symptoms Strength training completed today  Goals Unmet:  Not Applicable  Comments: Pt able to follow exercise prescription today without complaint.  Will continue to monitor for progression.    Dr. Emily Filbert is Medical Director for [place] and [facility].

## 2018-06-20 ENCOUNTER — Encounter: Payer: BLUE CROSS/BLUE SHIELD | Admitting: *Deleted

## 2018-06-20 DIAGNOSIS — Z952 Presence of prosthetic heart valve: Secondary | ICD-10-CM | POA: Diagnosis not present

## 2018-06-20 NOTE — Progress Notes (Signed)
Daily Session Note  Patient Details  Name: James Yates MRN: [id number] Date of Birth: 1955-09-06 Referring Provider:     Cardiac Rehab from 05/07/2018 in [facility] Cardiac and Pulmonary Rehab  Referring Provider  [name] MD      Encounter Date: 06/20/2018  Check In: Session Check In - 06/20/18 1706      Check-In   Supervising physician immediately available to respond to emergencies  See telemetry face sheet for immediately available ER MD    Location  [facility]-Cardiac & Pulmonary Rehab    Staff Present  Renita Papa, RN BSN;Carroll Enterkin, RN, Vickki Hearing, BA, ACSM CEP, Exercise Physiologist    Medication changes reported      No    Fall or balance concerns reported     No    Tobacco Cessation  No Change    Warm-up and Cool-down  Performed as group-led instruction    Resistance Training Performed  Yes    VAD Patient?  No    PAD/SET Patient?  No      Pain Assessment   Currently in Pain?  No/denies          Social History   Tobacco Use  Smoking Status Never Smoker  Smokeless Tobacco Never Used    Goals Met:  Independence with exercise equipment Exercise tolerated well No report of cardiac concerns or symptoms Strength training completed today  Goals Unmet:  Not Applicable  Comments: Pt able to follow exercise prescription today without complaint.  Will continue to monitor for progression.    Dr. Emily Filbert is Medical Director for [place] and [facility].

## 2018-06-21 DIAGNOSIS — Z952 Presence of prosthetic heart valve: Secondary | ICD-10-CM | POA: Diagnosis not present

## 2018-06-21 NOTE — Progress Notes (Signed)
Daily Session Note  Patient Details  Name: James Yates MRN: [id number] Date of Birth: 1956/02/21 Referring Provider:     Cardiac Rehab from 05/07/2018 in [facility] Cardiac and Pulmonary Rehab  Referring Provider  [name] MD      Encounter Date: 06/21/2018  Check In: Session Check In - 06/21/18 1609      Check-In   Supervising physician immediately available to respond to emergencies  See telemetry face sheet for immediately available ER MD    Location  [facility]-Cardiac & Pulmonary Rehab    Staff Present  Justin Mend RCP,RRT,BSRT;Meredith Sherryll Burger, RN Moises Blood, BS, ACSM CEP, Exercise Physiologist    Medication changes reported      No    Fall or balance concerns reported     No    Warm-up and Cool-down  Performed as group-led instruction    Resistance Training Performed  Yes    VAD Patient?  No    PAD/SET Patient?  No      Pain Assessment   Currently in Pain?  No/denies          Social History   Tobacco Use  Smoking Status Never Smoker  Smokeless Tobacco Never Used    Goals Met:  Independence with exercise equipment Exercise tolerated well No report of cardiac concerns or symptoms Strength training completed today  Goals Unmet:  Not Applicable  Comments: Pt able to follow exercise prescription today without complaint.  Will continue to monitor for progression.    Dr. Emily Filbert is Medical Director for [place] and [facility].

## 2018-06-25 ENCOUNTER — Encounter: Payer: BLUE CROSS/BLUE SHIELD | Admitting: *Deleted

## 2018-06-25 DIAGNOSIS — Z952 Presence of prosthetic heart valve: Secondary | ICD-10-CM | POA: Diagnosis not present

## 2018-06-25 NOTE — Progress Notes (Signed)
Daily Session Note  Patient Details  Name: James Yates MRN: [id number] Date of Birth: 1955-11-11 Referring Provider:     Cardiac Rehab from 05/07/2018 in [facility] Cardiac and Pulmonary Rehab  Referring Provider  [name] MD      Encounter Date: 06/25/2018  Check In: Session Check In - 06/25/18 1703      Check-In   Supervising physician immediately available to respond to emergencies  See telemetry face sheet for immediately available ER MD    Location  [facility]-Cardiac & Pulmonary Rehab    Staff Present  Earlean Shawl, BS, ACSM CEP, Exercise Physiologist;Amanda Oletta Darter, BA, ACSM CEP, Exercise Physiologist;Carroll Enterkin, RN, BSN    Medication changes reported      No    Fall or balance concerns reported     No    Tobacco Cessation  No Change    Warm-up and Cool-down  Performed as group-led instruction    Resistance Training Performed  Yes    VAD Patient?  No    PAD/SET Patient?  No      Pain Assessment   Currently in Pain?  No/denies    Multiple Pain Sites  No          Social History   Tobacco Use  Smoking Status Never Smoker  Smokeless Tobacco Never Used    Goals Met:  Independence with exercise equipment Exercise tolerated well No report of cardiac concerns or symptoms Strength training completed today  Goals Unmet:  Not Applicable  Comments: Pt able to follow exercise prescription today without complaint.  Will continue to monitor for progression.    Dr. Emily Filbert is Medical Director for [place] and [facility].

## 2018-06-27 ENCOUNTER — Encounter: Payer: Self-pay | Admitting: *Deleted

## 2018-06-27 DIAGNOSIS — Z952 Presence of prosthetic heart valve: Secondary | ICD-10-CM

## 2018-06-27 NOTE — Progress Notes (Signed)
Cardiac Individual Treatment Plan  Patient Details  Name: James Yates MRN: [id number] Date of Birth: 09/30/1955 Referring Provider:     Cardiac Rehab from 05/07/2018 in [facility] Cardiac and Pulmonary Rehab  Referring Provider  [name] MD      Initial Encounter Date:    Cardiac Rehab from 05/07/2018 in [facility] Cardiac and Pulmonary Rehab  Date  05/07/18      Visit Diagnosis: S/P TAVR (transcatheter aortic valve replacement)  Patient's Home Medications on Admission:  Current Outpatient Medications:  .  amLODipine (NORVASC) 5 MG tablet, TAKE 1 TABLET BY MOUTH EVERY DAY, Disp: 90 tablet, Rfl: 0 .  amoxicillin (AMOXIL) 500 MG tablet, Take 4 tablets (2,000 mg) one hour prior to dental visits., Disp: 4 tablet, Rfl: 10 .  aspirin EC 81 MG tablet, Take 1 tablet (81 mg total) by mouth daily., Disp: 30 tablet, Rfl: 3 .  atorvastatin (LIPITOR) 80 MG tablet, TAKE 1 TABLET (80 MG TOTAL) BY MOUTH DAILY AT 6 PM. (Patient taking differently: Take 80 mg by mouth daily at 6 PM. TAKE 1 TABLET (80 MG TOTAL) BY MOUTH DAILY AT 6 PM.), Disp: 90 tablet, Rfl: 3 .  clopidogrel (PLAVIX) 75 MG tablet, Take 1 tablet (75 mg total) by mouth daily with breakfast., Disp: 90 tablet, Rfl: 1 .  metoprolol tartrate (LOPRESSOR) 50 MG tablet, TAKE 1 TABLET BY MOUTH TWICE A DAY, Disp: 180 tablet, Rfl: 1 .  potassium chloride (K-DUR) 10 MEQ tablet, Take 1 tablet (10 mEq total) by mouth daily., Disp: 90 tablet, Rfl: 2  Past Medical History: Past Medical History:  Diagnosis Date  . Coronary artery disease    s/p CABG  . Glucose intolerance (impaired glucose tolerance)   . Hyperlipidemia   . Hypertension   . Obesity   . S/P TAVR (transcatheter aortic valve replacement)    26 mm Edwards Sapien 3 transcatheter heart valve placed via percutaneous right transfemoral approach   . Severe aortic stenosis     Tobacco Use: Social History   Tobacco Use  Smoking Status Never Smoker  Smokeless Tobacco Never Used     Labs: Recent Review Flowsheet Data    Labs for ITP Cardiac and Pulmonary Rehab Latest Ref Rng & Units 03/09/2018 03/13/2018 03/13/2018 03/13/2018 03/13/2018   Cholestrol 0 - 200 mg/dL - - - - -   LDLCALC 0 - 99 mg/dL - - - - -   HDL >40 mg/dL - - - - -   Trlycerides <150 mg/dL - - - - -   Hemoglobin A1c 4.8 - 5.6 % 6.3(H) - - - -   PHART 7.350 - 7.450 7.431 - - - 7.385   PCO2ART 32.0 - 48.0 mmHg 42.0 - - - 40.6   HCO3 20.0 - 28.0 mmol/L 27.4 - - - 24.4   TCO2 22 - 32 mmol/L - _0 ACIDBASEDEF 0.0 - 2.0 mmol/L - - - - 1.0   O2SAT % 92.4 - - - 100.0       Exercise Target Goals: Exercise Program Goal: Individual exercise prescription set using results from initial 6 min walk test and THRR while considering  patient's activity barriers and safety.   Exercise Prescription Goal: Initial exercise prescription builds to 30-45 minutes a day of aerobic activity, 2-3 days per week.  Home exercise guidelines will be given to patient during program as part of exercise prescription that the participant will acknowledge.  Activity Barriers & Risk Stratification: Activity Barriers & Cardiac Risk  Stratification - 05/07/18 1133      Activity Barriers & Cardiac Risk Stratification   Activity Barriers  Joint Problems;Deconditioning;Muscular Weakness;Shortness of Breath   Left knee hurts. advised to see primary MD to check his knee.  States no swelling, has been hurting for a few days. No history of injury.   Cardiac Risk Stratification  High       6 Minute Walk: 6 Minute Walk    Row Name 05/07/18 1259         6 Minute Walk   Phase  Initial     Distance  1155 feet     Walk Time  6 minutes     # of Rest Breaks  0     MPH  2.19     METS  2.81     RPE  9     Perceived Dyspnea   1     VO2 Peak  9.85     Symptoms  Yes (comment)     Comments  SOB, knee pain 4/10     Resting HR  77 bpm     Resting BP  136/64     Resting Oxygen Saturation   98 %     Exercise Oxygen  Saturation  during 6 min walk  100 %     Max Ex. HR  98 bpm     Max Ex. BP  138/76     2 Minute Post BP  128/62        Oxygen Initial Assessment:   Oxygen Re-Evaluation:   Oxygen Discharge (Final Oxygen Re-Evaluation):   Initial Exercise Prescription: Initial Exercise Prescription - 05/07/18 1300      Date of Initial Exercise RX and Referring Provider   Date  05/07/18    Referring Provider  [name] MD      Treadmill   MPH  2.1    Grade  0.5    Minutes  15    METs  2.75      NuStep   Level  2    SPM  80    Minutes  15    METs  2.5      Recumbant Elliptical   Level  1    RPM  50    Minutes  15    METs  2.5      Prescription Details   Frequency (times per week)  3    Duration  Progress to 45 minutes of aerobic exercise without signs/symptoms of physical distress      Intensity   THRR 40-80% of Max Heartrate  110-143    Ratings of Perceived Exertion  11-13    Perceived Dyspnea  0-4      Progression   Progression  Continue to progress workloads to maintain intensity without signs/symptoms of physical distress.      Resistance Training   Training Prescription  Yes    Weight  3 lbs    Reps  10-15       Perform Capillary Blood Glucose checks as needed.  Exercise Prescription Changes: Exercise Prescription Changes    Row Name 05/07/18 1300 05/22/18 1100 05/28/18 1700 06/07/18 0800 06/21/18 0900     Response to Exercise   Blood Pressure (Admit)  136/64  110/70  -  -  130/60   Blood Pressure (Exercise)  138/76  138/70  -  138/74  140/60   Blood Pressure (Exit)  128/62  132/80  -  128/80  124/68   Heart  Rate (Admit)  77 bpm  91 bpm  -  70 bpm  90 bpm   Heart Rate (Exercise)  98 bpm  113 bpm  -  112 bpm  121 bpm   Heart Rate (Exit)  83 bpm  90 bpm  -  96 bpm  96 bpm   Oxygen Saturation (Admit)  98 %  -  -  -  -   Oxygen Saturation (Exercise)  100 %  -  -  -  -   Rating of Perceived Exertion (Exercise)  9  14  -  12  13   Perceived Dyspnea  (Exercise)  1  -  -  -  -   Symptoms  SOB, knee pain 4/10  none  -  none  none   Comments  walk test results  -  -  -  -   Duration  -  Continue with 45 min of aerobic exercise without signs/symptoms of physical distress.  -  Continue with 45 min of aerobic exercise without signs/symptoms of physical distress.  Continue with 45 min of aerobic exercise without signs/symptoms of physical distress.   Intensity  -  THRR unchanged  -  THRR unchanged  THRR unchanged     Progression   Progression  -  Continue to progress workloads to maintain intensity without signs/symptoms of physical distress.  -  Continue to progress workloads to maintain intensity without signs/symptoms of physical distress.  Continue to progress workloads to maintain intensity without signs/symptoms of physical distress.   Average METs  -  2.5  -  2.8  3.3     Resistance Training   Training Prescription  -  Yes  -  Yes  Yes   Weight  -  3 lb  -  3 lb  3 lb   Reps  -  10-15  -  10-15  10-15     Interval Training   Interval Training  -  -  -  No  No     Treadmill   MPH  -  2  -  2.4  3   Grade  -  0.5  -  1  1   Minutes  -  15  -  15  15   METs  -  2.67  -  3.17  3.71     NuStep   Level  -  2  -  3  3   SPM  -  80  -  80  80   Minutes  -  15  -  15  15   METs  -  3  -  3.6  4.2     Recumbant Elliptical   Level  -  1.5  -  1.5  1.5   RPM  -  50  -  50  50   Minutes  -  15  -  15  15   METs  -  1.8  -  1.8  2.1     Home Exercise Plan   Plans to continue exercise at  -  -  Home (comment) walking at home and mall  Home (comment) walking at home and mall  -   Frequency  -  -  Add 2 additional days to program exercise sessions.  Add 2 additional days to program exercise sessions.  -   Initial Home Exercises Provided  -  -  05/28/18  05/28/18  -  Exercise Comments: Exercise Comments    Row Name 05/10/18 1619           Exercise Comments  First full day of exercise!  Patient was oriented to gym and equipment  including functions, settings, policies, and procedures.  Patient's individual exercise prescription and treatment plan were reviewed.  All starting workloads were established based on the results of the 6 minute walk test done at initial orientation visit.  The plan for exercise progression was also introduced and progression will be customized based on patient's performance and goals.          Exercise Goals and Review: Exercise Goals    Row Name 05/07/18 1302             Exercise Goals   Increase Physical Activity  Yes       Intervention  Provide advice, education, support and counseling about physical activity/exercise needs.;Develop an individualized exercise prescription for aerobic and resistive training based on initial evaluation findings, risk stratification, comorbidities and participant's personal goals.       Expected Outcomes  Short Term: Attend rehab on a regular basis to increase amount of physical activity.;Long Term: Add in home exercise to make exercise part of routine and to increase amount of physical activity.;Long Term: Exercising regularly at least 3-5 days a week.       Increase Strength and Stamina  Yes       Intervention  Provide advice, education, support and counseling about physical activity/exercise needs.;Develop an individualized exercise prescription for aerobic and resistive training based on initial evaluation findings, risk stratification, comorbidities and participant's personal goals.       Expected Outcomes  Short Term: Increase workloads from initial exercise prescription for resistance, speed, and METs.;Short Term: Perform resistance training exercises routinely during rehab and add in resistance training at home;Long Term: Improve cardiorespiratory fitness, muscular endurance and strength as measured by increased METs and functional capacity (6MWT)       Able to understand and use rate of perceived exertion (RPE) scale  Yes       Intervention  Provide  education and explanation on how to use RPE scale       Expected Outcomes  Short Term: Able to use RPE daily in rehab to express subjective intensity level;Long Term:  Able to use RPE to guide intensity level when exercising independently       Knowledge and understanding of Target Heart Rate Range (THRR)  Yes       Intervention  Provide education and explanation of THRR including how the numbers were predicted and where they are located for reference       Expected Outcomes  Short Term: Able to state/look up THRR;Short Term: Able to use daily as guideline for intensity in rehab;Long Term: Able to use THRR to govern intensity when exercising independently       Able to check pulse independently  Yes       Intervention  Provide education and demonstration on how to check pulse in carotid and radial arteries.;Review the importance of being able to check your own pulse for safety during independent exercise       Expected Outcomes  Short Term: Able to explain why pulse checking is important during independent exercise;Long Term: Able to check pulse independently and accurately       Understanding of Exercise Prescription  Yes       Intervention  Provide education, explanation, and written materials on patient's individual exercise prescription  Expected Outcomes  Short Term: Able to explain program exercise prescription;Long Term: Able to explain home exercise prescription to exercise independently          Exercise Goals Re-Evaluation : Exercise Goals Re-Evaluation    Row Name 05/10/18 1619 05/22/18 1103 05/28/18 1724 06/07/18 0820 06/21/18 0935     Exercise Goal Re-Evaluation   Exercise Goals Review  Able to understand and use rate of perceived exertion (RPE) scale;Knowledge and understanding of Target Heart Rate Range (THRR);Understanding of Exercise Prescription  Increase Physical Activity;Increase Strength and Stamina;Able to understand and use rate of perceived exertion (RPE) scale   Increase Physical Activity;Able to understand and use rate of perceived exertion (RPE) scale;Knowledge and understanding of Target Heart Rate Range (THRR);Understanding of Exercise Prescription;Increase Strength and Stamina;Able to check pulse independently  Increase Physical Activity;Increase Strength and Stamina;Able to understand and use rate of perceived exertion (RPE) scale;Knowledge and understanding of Target Heart Rate Range (THRR);Understanding of Exercise Prescription  Increase Physical Activity;Increase Strength and Stamina;Able to understand and use rate of perceived exertion (RPE) scale;Knowledge and understanding of Target Heart Rate Range (THRR);Understanding of Exercise Prescription   Comments  Reviewed RPE scale, THR and program prescription with pt today.  Pt voiced understanding and was given a copy of goals to take home.   Jonhatan has tolerated exercsie well so far.  He can complete 45 min continuous without symptoms.  Staff will monitor progress.  Reviewed home exercise with pt today.  Pt plans to walk at home and mall for exercise.  Reviewed THR, pulse, RPE, sign and symptoms, NTG use, and when to call 911 or MD.  Also discussed weather considerations and indoor options.  Pt voiced understanding.  Arrick has made small increase in MET level.  He attends consistently and works in correct THR and RPE range.    Hussein has improved his MET level.  He continues to attend consistently and work at prescribed HR and RPE levels.   Expected Outcomes  Short: Use RPE daily to regulate intensity. Long: Follow program prescription in THR.  Short - attend class consistently Long - increase overall MET level  Short- add 2 days of walking at home. Long- Exercise 5 days per week even after completion of program.  Short - increase weight strength training, resistance on machines Long - increase overall MET level  Short - complete HT program Long - continue exercise on his own      Discharge Exercise  Prescription (Final Exercise Prescription Changes): Exercise Prescription Changes - 06/21/18 0900      Response to Exercise   Blood Pressure (Admit)  130/60    Blood Pressure (Exercise)  140/60    Blood Pressure (Exit)  124/68    Heart Rate (Admit)  90 bpm    Heart Rate (Exercise)  121 bpm    Heart Rate (Exit)  96 bpm    Rating of Perceived Exertion (Exercise)  13    Symptoms  none    Duration  Continue with 45 min of aerobic exercise without signs/symptoms of physical distress.    Intensity  THRR unchanged      Progression   Progression  Continue to progress workloads to maintain intensity without signs/symptoms of physical distress.    Average METs  3.3      Resistance Training   Training Prescription  Yes    Weight  3 lb    Reps  10-15      Interval Training   Interval Training  No  Treadmill   MPH  3    Grade  1    Minutes  15    METs  3.71      NuStep   Level  3    SPM  80    Minutes  15    METs  4.2      Recumbant Elliptical   Level  1.5    RPM  50    Minutes  15    METs  2.1       Nutrition:  Target Goals: Understanding of nutrition guidelines, daily intake of sodium <1568m, cholesterol <2064m calories 30% from fat and 7% or less from saturated fats, daily to have 5 or more servings of fruits and vegetables.  Biometrics: Pre Biometrics - 05/07/18 1303      Pre Biometrics   Height  5' 4.5" (1.638 m)    Weight  196 lb 6.4 oz (89.1 kg)    Waist Circumference  41 inches    Hip Circumference  40.75 inches    Waist to Hip Ratio  1.01 %    BMI (Calculated)  33.2    Single Leg Stand  14.34 seconds        Nutrition Therapy Plan and Nutrition Goals: Nutrition Therapy & Goals - 05/14/18 1654      Nutrition Therapy   Diet  TLC    Drug/Food Interactions  Statins/Certain Fruits    Protein (specify units)  8-9oz    Fiber  35 grams    Whole Grain Foods  3 servings   does not typically choose whole grains   Saturated Fats  15 max. grams     Fruits and Vegetables  4 servings/day   8 ideal; eats fruits but does not usually eat vegetables   Sodium  1500 grams      Personal Nutrition Goals   Nutrition Goal  At breakfast, add a protein source to your sweet bread such as an egg or tu[place]acon, rather than eating the sweet bread on its own    Personal Goal #2  At meal times, try to include a vegetable at least one more day per week, with a long-term goal of having a larger portion of vegetables on your plate than starch (rice)    Personal Goal #3  Great job for starting to choose leaner meats (less pork/red meat)!    Personal Goal #4  Drink one less glass of juice per day, and replace it with water    Comments  HgbA1c (02/2018) 6.3 H. He reports to be unaware of this number. Typically eats 1 meal/day + a sweet bread and coffee with milk in the morning. For dinner he eats at home half of the time and out at restaurants half of the time. He feels that he "eats too much rice" and eats very few vegetables. Since his surgery he has been eating less red meat and pork, more chicken, which he bakes/grills and sometimes fries. Beverages: coffee, juice, water. He thinks he has been drinking more juice as of late. Snacks: fruit, tu[place]wich.      Intervention Plan   Intervention  Prescribe, educate and counsel regarding individualized specific dietary modifications aiming towards targeted core components such as weight, hypertension, lipid management, diabetes, heart failure and other comorbidities.    Expected Outcomes  Short Term Goal: Understand basic principles of dietary content, such as calories, fat, sodium, cholesterol and nutrients.;Long Term Goal: Adherence to prescribed nutrition plan.;Short Term Goal: A plan has been  developed with personal nutrition goals set during dietitian appointment.       Nutrition Assessments: Nutrition Assessments - 05/07/18 1142      MEDFICTS Scores   Pre Score  36       Nutrition Goals  Re-Evaluation: Nutrition Goals Re-Evaluation    Row Name 05/14/18 1706 06/20/18 1754           Goals   Nutrition Goal  At breakfast, add a protein source to your sweet bread such as an egg or [place] bacon, rather than eating the sweet bread on its own  -      Comment  He has been eating sweet breads with coffee and juice for breakfast lately; last A1c 6.3  Robertson is having less greasy food and ha slost 4 lb      Expected Outcome  He will add a protein source to his breakfast, or choose a whole grain bread option rather than a 'sweet bread.'  Short - continue to eat less greasy food Long - reach goal weight        Personal Goal #2 Re-Evaluation   Personal Goal #2  At meal times, try to include a vegetable at least one more day per week, with a long term goal of having a larger portion of vegetables on your plate than starch (rice) Plate method reviewed  -        Personal Goal #3 Re-Evaluation   Personal Goal #3  Great job for starting to choose leaner meats (less pork/ red meats!)  -        Personal Goal #4 Re-Evaluation   Personal Goal #4  Drink one less glass of juice per day, and replace it with water  -         Nutrition Goals Discharge (Final Nutrition Goals Re-Evaluation): Nutrition Goals Re-Evaluation - 06/20/18 1754      Goals   Comment  Kailan is having less greasy food and ha slost 4 lb    Expected Outcome  Short - continue to eat less greasy food Long - reach goal weight       Psychosocial: Target Goals: Acknowledge presence or absence of significant depression and/or stress, maximize coping skills, provide positive support system. Participant is able to verbalize types and ability to use techniques and skills needed for reducing stress and depression.   Initial Review & Psychosocial Screening: Initial Psych Review & Screening - 05/07/18 1134      Initial Review   Current issues with  None Identified      Family Dynamics   Good Support System?  Yes   Wife and  children     Barriers   Psychosocial barriers to participate in program  There are no identifiable barriers or psychosocial needs.;The patient should benefit from training in stress management and relaxation.      Screening Interventions   Interventions  Encouraged to exercise;Program counselor consult;To provide support and resources with identified psychosocial needs;Provide feedback about the scores to participant    Expected Outcomes  Short Term goal: Utilizing psychosocial counselor, staff and physician to assist with identification of specific Stressors or current issues interfering with healing process. Setting desired goal for each stressor or current issue identified.;Long Term Goal: Stressors or current issues are controlled or eliminated.;Short Term goal: Identification and review with participant of any Quality of Life or Depression concerns found by scoring the questionnaire.;Long Term goal: The participant improves quality of Life and PHQ9 Scores as seen by post scores and/or  verbalization of changes       Quality of Life Scores:  Quality of Life - 05/07/18 1134      Quality of Life   Select  Quality of Life      Quality of Life Scores   Health/Function Pre  25.5 %    Socioeconomic Pre  23.25 %    Psych/Spiritual Pre  24.58 %    Family Pre  28.8 %    GLOBAL Pre  25.29 %      Scores of 19 and below usually indicate a poorer quality of life in these areas.  A difference of  2-3 points is a clinically meaningful difference.  A difference of 2-3 points in the total score of the Quality of Life Index has been associated with significant improvement in overall quality of life, self-image, physical symptoms, and general health in studies assessing change in quality of life.  PHQ-9: Recent Review Flowsheet Data    Depression screen [facility] 2/9 05/07/2018   Decreased Interest 3   Down, Depressed, Hopeless 1   PHQ - 2 Score 4   Altered sleeping 1   Tired, decreased energy 1   Change  in appetite 2   Feeling bad or failure about yourself  0   Trouble concentrating 0   Moving slowly or fidgety/restless 2   Suicidal thoughts 0   PHQ-9 Score 10   Difficult doing work/chores Somewhat difficult     Interpretation of Total Score  Total Score Depression Severity:  1-4 = Minimal depression, 5-9 = Mild depression, 10-14 = Moderate depression, 15-19 = Moderately severe depression, 20-27 = Severe depression   Psychosocial Evaluation and Intervention: Psychosocial Evaluation - 05/14/18 1725      Psychosocial Evaluation & Interventions   Interventions  Stress management education;Relaxation education;Encouraged to exercise with the program and follow exercise prescription    Comments  Counselor met with Mr. Ledwith Choplin) today for initial psychosocial evaluation.  He was accompanied by an interpreter since Aston is hispanic.  He is a 63 year old who had open heart surgery and a heart cathe on 03/14/18.  He has a strong support system with a spouse of 30 years and (3) adult children locally.  He reports sleeping only about 5 hours/night and has a good appetite.  He denies a history of depression or anxiety or any current symptoms; but admits that he "comfort eats" at times when he is a little anxious.  Markel states his mood is generally positive most of the time.  He has some stress currently with his 40 year old father who is sick and 58 year old mother being so far away - in [place].  He is also concerned about his own health.  Makarios has goals to have a stronger heart and possibly lose some weight while in this program.  He will be followed by staff.     Expected Outcomes  Short:   Kamaury will meet with the dietician to address his weight loss goal.  He will also exercise for stress reduction and for his physical health.  Long:  Zoe will develop a routine of positive self-care practicies for his health and his mental health.     Continue Psychosocial Services   Follow up required  by staff       Psychosocial Re-Evaluation: Psychosocial Re-Evaluation    [place] Name 06/20/18 1752             Psychosocial Re-Evaluation   Current issues with  Current  Stress Concerns       Comments  Rister main stress is his dad, who is in poor health in [place].  he is going to see him in a couple weeks.       Expected Outcomes  Short - speak with counselor if furhter concerns Long - manage stress on his own       Interventions  Encouraged to attend Cardiac Rehabilitation for the exercise       Continue Psychosocial Services   Follow up required by staff          Psychosocial Discharge (Final Psychosocial Re-Evaluation): Psychosocial Re-Evaluation - 06/20/18 1752      Psychosocial Re-Evaluation   Current issues with  Current Stress Concerns    Comments  Nazareno main stress is his dad, who is in poor health in [place].  he is going to see him in a couple weeks.    Expected Outcomes  Short - speak with counselor if furhter concerns Long - manage stress on his own    Interventions  Encouraged to attend Cardiac Rehabilitation for the exercise    Continue Psychosocial Services   Follow up required by staff       Vocational Rehabilitation: Provide vocational rehab assistance to qualifying candidates.   Vocational Rehab Evaluation & Intervention: Vocational Rehab - 05/07/18 1143      Initial Vocational Rehab Evaluation & Intervention   Assessment shows need for Vocational Rehabilitation  No       Education: Education Goals: Education classes will be provided on a variety of topics geared toward better understanding of heart health and risk factor modification. Participant will state understanding/return demonstration of topics presented as noted by education test scores.  Learning Barriers/Preferences: Learning Barriers/Preferences - 05/07/18 1142      Learning Barriers/Preferences   Learning Barriers  Sight;Language   interpreter to be at class   Learning Preferences   Skilled Demonstration       Education Topics:  AED/CPR: - Group verbal and written instruction with the use of models to demonstrate the basic use of the AED with the basic ABC's of resuscitation.   Cardiac Rehab from 06/25/2018 in [facility] Cardiac and Pulmonary Rehab  Date  05/28/18  Educator  CE  Instruction Review Code  1- Verbalizes Understanding      General Nutrition Guidelines/Fats and Fiber: -Group instruction provided by verbal, written material, models and posters to present the general guidelines for heart healthy nutrition. Gives an explanation and review of dietary fats and fiber.   Cardiac Rehab from 06/25/2018 in [facility] Cardiac and Pulmonary Rehab  Date  06/11/18  Educator  LB  Instruction Review Code  1- Verbalizes Understanding      Controlling Sodium/Reading Food Labels: -Group verbal and written material supporting the discussion of sodium use in heart healthy nutrition. Review and explanation with models, verbal and written materials for utilization of the food label.   Cardiac Rehab from 06/25/2018 in [facility] Cardiac and Pulmonary Rehab  Date  06/13/18  Educator  LB  Instruction Review Code  1- Verbalizes Understanding      Exercise Physiology & General Exercise Guidelines: - Group verbal and written instruction with models to review the exercise physiology of the cardiovascular system and associated critical values. Provides general exercise guidelines with specific guidelines to those with heart or lung disease.    Cardiac Rehab from 06/25/2018 in [facility] Cardiac and Pulmonary Rehab  Date  06/18/18  Educator  [facility]  Instruction Review Code  1- [employer]  Understanding      Aerobic Exercise & Resistance Training: - Gives group verbal and written instruction on the various components of exercise. Focuses on aerobic and resistive training programs and the benefits of this training and how to safely progress through these programs..   Cardiac Rehab from 06/25/2018 in [facility]  Cardiac and Pulmonary Rehab  Date  06/20/18  Educator  AS  Instruction Review Code  1- Verbalizes Understanding      Flexibility, Balance, Mind/Body Relaxation: Provides group verbal/written instruction on the benefits of flexibility and balance training, including mind/body exercise modes such as yoga, pilates and tai chi.  Demonstration and skill practice provided.   Cardiac Rehab from 06/25/2018 in [facility] Cardiac and Pulmonary Rehab  Date  06/25/18  Educator  AS  Instruction Review Code  1- Verbalizes Understanding      Stress and Anxiety: - Provides group verbal and written instruction about the health risks of elevated stress and causes of high stress.  Discuss the correlation between heart/lung disease and anxiety and treatment options. Review healthy ways to manage with stress and anxiety.   Depression: - Provides group verbal and written instruction on the correlation between heart/lung disease and depressed mood, treatment options, and the stigmas associated with seeking treatment.   Cardiac Rehab from 06/25/2018 in [facility] Cardiac and Pulmonary Rehab  Date  06/06/18  Educator  [facility]  Instruction Review Code  1- Verbalizes Understanding      Anatomy & Physiology of the Heart: - Group verbal and written instruction and models provide basic cardiac anatomy and physiology, with the coronary electrical and arterial systems. Review of Valvular disease and Heart Failure   Cardiac Procedures: - Group verbal and written instruction to review commonly prescribed medications for heart disease. Reviews the medication, class of the drug, and side effects. Includes the steps to properly store meds and maintain the prescription regimen. (beta blockers and nitrates)   Cardiac Medications I: - Group verbal and written instruction to review commonly prescribed medications for heart disease. Reviews the medication, class of the drug, and side effects. Includes the steps to properly store meds and  maintain the prescription regimen.   Cardiac Medications II: -Group verbal and written instruction to review commonly prescribed medications for heart disease. Reviews the medication, class of the drug, and side effects. (all other drug classes)    Go Sex-Intimacy & Heart Disease, Get SMART - Goal Setting: - Group verbal and written instruction through game format to discuss heart disease and the return to sexual intimacy. Provides group verbal and written material to discuss and apply goal setting through the application of the S.M.A.R.T. Method.   Other Matters of the Heart: - Provides group verbal, written materials and models to describe Stable Angina and Peripheral Artery. Includes description of the disease process and treatment options available to the cardiac patient.   Exercise & Equipment Safety: - Individual verbal instruction and demonstration of equipment use and safety with use of the equipment.   Cardiac Rehab from 06/25/2018 in [facility] Cardiac and Pulmonary Rehab  Date  05/07/18  Educator  SB  Instruction Review Code  1- Verbalizes Understanding      Infection Prevention: - Provides verbal and written material to individual with discussion of infection control including proper hand washing and proper equipment cleaning during exercise session.   Cardiac Rehab from 06/25/2018 in [facility] Cardiac and Pulmonary Rehab  Date  05/07/18  Educator  SB  Instruction Review Code  1- Verbalizes Understanding  Falls Prevention: - Provides verbal and written material to individual with discussion of falls prevention and safety.   Cardiac Rehab from 06/25/2018 in [facility] Cardiac and Pulmonary Rehab  Date  05/07/18  Educator  SB  Instruction Review Code  1- Verbalizes Understanding      Diabetes: - Individual verbal and written instruction to review signs/symptoms of diabetes, desired ranges of glucose level fasting, after meals and with exercise. Acknowledge that pre and post  exercise glucose checks will be done for 3 sessions at entry of program.   Know Your Numbers and Risk Factors: -Group verbal and written instruction about important numbers in your health.  Discussion of what are risk factors and how they play a role in the disease process.  Review of Cholesterol, Blood Pressure, Diabetes, and BMI and the role they play in your overall health.   Sleep Hygiene: -Provides group verbal and written instruction about how sleep can affect your health.  Define sleep hygiene, discuss sleep cycles and impact of sleep habits. Review good sleep hygiene tips.    Other: -Provides group and verbal instruction on various topics (see comments)   Knowledge Questionnaire Score: Knowledge Questionnaire Score - 05/07/18 1142      Knowledge Questionnaire Score   Pre Score  24/26   Reviewed correct responses today with Welles and his family member. Verbalized understanding and no questions were asked      Core Components/Risk Factors/Patient Goals at Admission: Personal Goals and Risk Factors at Admission - 05/07/18 1143      Core Components/Risk Factors/Patient Goals on Admission    Weight Management  Yes;Weight Loss    Admit Weight  196 lb 6.4 oz (89.1 kg)    Goal Weight: Short Term  194 lb (88 kg)    Goal Weight: Long Term  180 lb (81.6 kg)    Expected Outcomes  Short Term: Continue to assess and modify interventions until short term weight is achieved;Long Term: Adherence to nutrition and physical activity/exercise program aimed toward attainment of established weight goal;Weight Loss: Understanding of general recommendations for a balanced deficit meal plan, which promotes 1-2 lb weight loss per week and includes a negative energy balance of [id number] kcal/d    Hypertension  Yes    Intervention  Provide education on lifestyle modifcations including regular physical activity/exercise, weight management, moderate sodium restriction and increased consumption of fresh  fruit, vegetables, and low fat dairy, alcohol moderation, and smoking cessation.;Monitor prescription use compliance.    Expected Outcomes  Short Term: Continued assessment and intervention until BP is < 140/70m HG in hypertensive participants. < 130/846mHG in hypertensive participants with diabetes, heart failure or chronic kidney disease.;Long Term: Maintenance of blood pressure at goal levels.    Lipids  Yes    Intervention  Provide education and support for participant on nutrition & aerobic/resistive exercise along with prescribed medications to achieve LDL '70mg'$ , HDL >'40mg'$ .    Expected Outcomes  Short Term: Participant states understanding of desired cholesterol values and is compliant with medications prescribed. Participant is following exercise prescription and nutrition guidelines.;Long Term: Cholesterol controlled with medications as prescribed, with individualized exercise RX and with personalized nutrition plan. Value goals: LDL < '70mg'$ , HDL > 40 mg.       Core Components/Risk Factors/Patient Goals Review:  Goals and Risk Factor Review    Row Name 06/20/18 1746             Core Components/Risk Factors/Patient Goals Review   Personal Goals Review  Weight Management/Obesity;Lipids;Hypertension  Review  Jeshurun is taking meds as directed.  He has a wrist cuff at home but feels like its not always accurate.  He sleeps 4-6 hours per night - only feels sleepy if he is not active.  He states he wakes up and doesnt feel he sleepy anymore so he gets up.  he feels refreshed not tired.         Expected Outcomes  Short - Continue to take meds as directed and attend [facility] - maintain healthy habits          Core Components/Risk Factors/Patient Goals at Discharge (Final Review):  Goals and Risk Factor Review - 06/20/18 1746      Core Components/Risk Factors/Patient Goals Review   Personal Goals Review  Weight Management/Obesity;Lipids;Hypertension    Review  Poseidon is taking meds as  directed.  He has a wrist cuff at home but feels like its not always accurate.  He sleeps 4-6 hours per night - only feels sleepy if he is not active.  He states he wakes up and doesnt feel he sleepy anymore so he gets up.  he feels refreshed not tired.      Expected Outcomes  Short - Continue to take meds as directed and attend [facility] - maintain healthy habits       ITP Comments: ITP Comments    Row Name 05/07/18 1305 05/29/18 0658 06/27/18 1414       ITP Comments  Medical evaluation completed with interpreter present.  Documentation for diagnosis can be found in [facility] encounter for 03/13/18.  Initial ITP created and sent for review and signature to Dr. Emily Filbert, Medical Director.   30 Day Review. Continue with ITP unless directed changes per Medical Director review. New to program.  30 day review completed. ITP sent to Dr. Emily Filbert, Medical Director of Cardiac Rehab. Continue with ITP unless changes are made by physician.        Comments: 30 day review

## 2018-06-27 NOTE — Progress Notes (Signed)
Daily Session Note  Patient Details  Name: James Yates MRN: [id number] Date of Birth: 1956-01-09 Referring Provider:     Cardiac Rehab from 05/07/2018 in [facility] Cardiac and Pulmonary Rehab  Referring Provider  [name] MD      Encounter Date: 06/27/2018  Check In: Session Check In - 06/27/18 1724      Check-In   Supervising physician immediately available to respond to emergencies  See telemetry face sheet for immediately available ER MD    Location  [facility]-Cardiac & Pulmonary Rehab    Staff Present  Renita Papa, RN BSN;Carroll Enterkin, RN, Vickki Hearing, BA, ACSM CEP, Exercise Physiologist    Medication changes reported      No    Fall or balance concerns reported     No    Warm-up and Cool-down  Performed as group-led instruction    Resistance Training Performed  Yes    VAD Patient?  No    PAD/SET Patient?  No      Pain Assessment   Currently in Pain?  No/denies    Multiple Pain Sites  No          Social History   Tobacco Use  Smoking Status Never Smoker  Smokeless Tobacco Never Used    Goals Met:  Independence with exercise equipment Exercise tolerated well No report of cardiac concerns or symptoms Strength training completed today  Goals Unmet:  Not Applicable  Comments: Pt able to follow exercise prescription today without complaint.  Will continue to monitor for progression.    Dr. Emily Filbert is Medical Director for [place] and [facility].

## 2018-06-28 VITALS — Ht 64.5 in | Wt 192.0 lb

## 2018-06-28 DIAGNOSIS — Z952 Presence of prosthetic heart valve: Secondary | ICD-10-CM

## 2018-06-28 NOTE — Progress Notes (Signed)
Cardiac Individual Treatment Plan  Patient Details  Name: James Yates MRN: [id number] Date of Birth: 09/30/1955 Referring Provider:     Cardiac Rehab from 05/07/2018 in [facility] Cardiac and Pulmonary Rehab  Referring Provider  [name] MD      Initial Encounter Date:    Cardiac Rehab from 05/07/2018 in [facility] Cardiac and Pulmonary Rehab  Date  05/07/18      Visit Diagnosis: S/P TAVR (transcatheter aortic valve replacement)  Patient's Home Medications on Admission:  Current Outpatient Medications:  .  amLODipine (NORVASC) 5 MG tablet, TAKE 1 TABLET BY MOUTH EVERY DAY, Disp: 90 tablet, Rfl: 0 .  amoxicillin (AMOXIL) 500 MG tablet, Take 4 tablets (2,000 mg) one hour prior to dental visits., Disp: 4 tablet, Rfl: 10 .  aspirin EC 81 MG tablet, Take 1 tablet (81 mg total) by mouth daily., Disp: 30 tablet, Rfl: 3 .  atorvastatin (LIPITOR) 80 MG tablet, TAKE 1 TABLET (80 MG TOTAL) BY MOUTH DAILY AT 6 PM. (Patient taking differently: Take 80 mg by mouth daily at 6 PM. TAKE 1 TABLET (80 MG TOTAL) BY MOUTH DAILY AT 6 PM.), Disp: 90 tablet, Rfl: 3 .  clopidogrel (PLAVIX) 75 MG tablet, Take 1 tablet (75 mg total) by mouth daily with breakfast., Disp: 90 tablet, Rfl: 1 .  metoprolol tartrate (LOPRESSOR) 50 MG tablet, TAKE 1 TABLET BY MOUTH TWICE A DAY, Disp: 180 tablet, Rfl: 1 .  potassium chloride (K-DUR) 10 MEQ tablet, Take 1 tablet (10 mEq total) by mouth daily., Disp: 90 tablet, Rfl: 2  Past Medical History: Past Medical History:  Diagnosis Date  . Coronary artery disease    s/p CABG  . Glucose intolerance (impaired glucose tolerance)   . Hyperlipidemia   . Hypertension   . Obesity   . S/P TAVR (transcatheter aortic valve replacement)    26 mm Edwards Sapien 3 transcatheter heart valve placed via percutaneous right transfemoral approach   . Severe aortic stenosis     Tobacco Use: Social History   Tobacco Use  Smoking Status Never Smoker  Smokeless Tobacco Never Used     Labs: Recent Review Flowsheet Data    Labs for ITP Cardiac and Pulmonary Rehab Latest Ref Rng & Units 03/09/2018 03/13/2018 03/13/2018 03/13/2018 03/13/2018   Cholestrol 0 - 200 mg/dL - - - - -   LDLCALC 0 - 99 mg/dL - - - - -   HDL >40 mg/dL - - - - -   Trlycerides <150 mg/dL - - - - -   Hemoglobin A1c 4.8 - 5.6 % 6.3(H) - - - -   PHART 7.350 - 7.450 7.431 - - - 7.385   PCO2ART 32.0 - 48.0 mmHg 42.0 - - - 40.6   HCO3 20.0 - 28.0 mmol/L 27.4 - - - 24.4   TCO2 22 - 32 mmol/L - _0 ACIDBASEDEF 0.0 - 2.0 mmol/L - - - - 1.0   O2SAT % 92.4 - - - 100.0       Exercise Target Goals: Exercise Program Goal: Individual exercise prescription set using results from initial 6 min walk test and THRR while considering  patient's activity barriers and safety.   Exercise Prescription Goal: Initial exercise prescription builds to 30-45 minutes a day of aerobic activity, 2-3 days per week.  Home exercise guidelines will be given to patient during program as part of exercise prescription that the participant will acknowledge.  Activity Barriers & Risk Stratification: Activity Barriers & Cardiac Risk  Stratification - 05/07/18 1133      Activity Barriers & Cardiac Risk Stratification   Activity Barriers  Joint Problems;Deconditioning;Muscular Weakness;Shortness of Breath   Left knee hurts. advised to see primary MD to check his knee.  States no swelling, has been hurting for a few days. No history of injury.   Cardiac Risk Stratification  High       6 Minute Walk: 6 Minute Walk    Row Name 05/07/18 1259 06/28/18 1626       6 Minute Walk   Phase  Initial  Discharge    Distance  1155 feet  1565 feet    Distance % Change  -  35.4 %    Distance Feet Change  -  410 ft    Walk Time  6 minutes  6 minutes    # of Rest Breaks  0  0    MPH  2.19  2.96    METS  2.81  3.9    RPE  9  12    Perceived Dyspnea   1  -    VO2 Peak  9.85  13.6    Symptoms  Yes (comment)  No    Comments   SOB, knee pain 4/10  -    Resting HR  77 bpm  95 bpm    Resting BP  136/64  144/80    Resting Oxygen Saturation   98 %  -    Exercise Oxygen Saturation  during 6 min walk  100 %  -    Max Ex. HR  98 bpm  124 bpm    Max Ex. BP  138/76  144/72    2 Minute Post BP  128/62  -       Oxygen Initial Assessment:   Oxygen Re-Evaluation:   Oxygen Discharge (Final Oxygen Re-Evaluation):   Initial Exercise Prescription: Initial Exercise Prescription - 05/07/18 1300      Date of Initial Exercise RX and Referring Provider   Date  05/07/18    Referring Provider  [name] MD      Treadmill   MPH  2.1    Grade  0.5    Minutes  15    METs  2.75      NuStep   Level  2    SPM  80    Minutes  15    METs  2.5      Recumbant Elliptical   Level  1    RPM  50    Minutes  15    METs  2.5      Prescription Details   Frequency (times per week)  3    Duration  Progress to 45 minutes of aerobic exercise without signs/symptoms of physical distress      Intensity   THRR 40-80% of Max Heartrate  110-143    Ratings of Perceived Exertion  11-13    Perceived Dyspnea  0-4      Progression   Progression  Continue to progress workloads to maintain intensity without signs/symptoms of physical distress.      Resistance Training   Training Prescription  Yes    Weight  3 lbs    Reps  10-15       Perform Capillary Blood Glucose checks as needed.  Exercise Prescription Changes: Exercise Prescription Changes    Row Name 05/07/18 1300 05/22/18 1100 05/28/18 1700 06/07/18 0800 06/21/18 0900     Response to Exercise   Blood Pressure (  Admit)  136/64  110/70  -  -  130/60   Blood Pressure (Exercise)  138/76  138/70  -  138/74  140/60   Blood Pressure (Exit)  128/62  132/80  -  128/80  124/68   Heart Rate (Admit)  77 bpm  91 bpm  -  70 bpm  90 bpm   Heart Rate (Exercise)  98 bpm  113 bpm  -  112 bpm  121 bpm   Heart Rate (Exit)  83 bpm  90 bpm  -  96 bpm  96 bpm   Oxygen Saturation  (Admit)  98 %  -  -  -  -   Oxygen Saturation (Exercise)  100 %  -  -  -  -   Rating of Perceived Exertion (Exercise)  9  14  -  12  13   Perceived Dyspnea (Exercise)  1  -  -  -  -   Symptoms  SOB, knee pain 4/10  none  -  none  none   Comments  walk test results  -  -  -  -   Duration  -  Continue with 45 min of aerobic exercise without signs/symptoms of physical distress.  -  Continue with 45 min of aerobic exercise without signs/symptoms of physical distress.  Continue with 45 min of aerobic exercise without signs/symptoms of physical distress.   Intensity  -  THRR unchanged  -  THRR unchanged  THRR unchanged     Progression   Progression  -  Continue to progress workloads to maintain intensity without signs/symptoms of physical distress.  -  Continue to progress workloads to maintain intensity without signs/symptoms of physical distress.  Continue to progress workloads to maintain intensity without signs/symptoms of physical distress.   Average METs  -  2.5  -  2.8  3.3     Resistance Training   Training Prescription  -  Yes  -  Yes  Yes   Weight  -  3 lb  -  3 lb  3 lb   Reps  -  10-15  -  10-15  10-15     Interval Training   Interval Training  -  -  -  No  No     Treadmill   MPH  -  2  -  2.4  3   Grade  -  0.5  -  1  1   Minutes  -  15  -  15  15   METs  -  2.67  -  3.17  3.71     NuStep   Level  -  2  -  3  3   SPM  -  80  -  80  80   Minutes  -  15  -  15  15   METs  -  3  -  3.6  4.2     Recumbant Elliptical   Level  -  1.5  -  1.5  1.5   RPM  -  50  -  50  50   Minutes  -  15  -  15  15   METs  -  1.8  -  1.8  2.1     Home Exercise Plan   Plans to continue exercise at  -  -  Home (comment) walking at home and mall  Home (comment) walking at home and mall  -  Frequency  -  -  Add 2 additional days to program exercise sessions.  Add 2 additional days to program exercise sessions.  -   Initial Home Exercises Provided  -  -  05/28/18  05/28/18  -      Exercise  Comments: Exercise Comments    Row Name 05/10/18 1619           Exercise Comments  First full day of exercise!  Patient was oriented to gym and equipment including functions, settings, policies, and procedures.  Patient's individual exercise prescription and treatment plan were reviewed.  All starting workloads were established based on the results of the 6 minute walk test done at initial orientation visit.  The plan for exercise progression was also introduced and progression will be customized based on patient's performance and goals.          Exercise Goals and Review: Exercise Goals    Row Name 05/07/18 1302             Exercise Goals   Increase Physical Activity  Yes       Intervention  Provide advice, education, support and counseling about physical activity/exercise needs.;Develop an individualized exercise prescription for aerobic and resistive training based on initial evaluation findings, risk stratification, comorbidities and participant's personal goals.       Expected Outcomes  Short Term: Attend rehab on a regular basis to increase amount of physical activity.;Long Term: Add in home exercise to make exercise part of routine and to increase amount of physical activity.;Long Term: Exercising regularly at least 3-5 days a week.       Increase Strength and Stamina  Yes       Intervention  Provide advice, education, support and counseling about physical activity/exercise needs.;Develop an individualized exercise prescription for aerobic and resistive training based on initial evaluation findings, risk stratification, comorbidities and participant's personal goals.       Expected Outcomes  Short Term: Increase workloads from initial exercise prescription for resistance, speed, and METs.;Short Term: Perform resistance training exercises routinely during rehab and add in resistance training at home;Long Term: Improve cardiorespiratory fitness, muscular endurance and strength as  measured by increased METs and functional capacity (6MWT)       Able to understand and use rate of perceived exertion (RPE) scale  Yes       Intervention  Provide education and explanation on how to use RPE scale       Expected Outcomes  Short Term: Able to use RPE daily in rehab to express subjective intensity level;Long Term:  Able to use RPE to guide intensity level when exercising independently       Knowledge and understanding of Target Heart Rate Range (THRR)  Yes       Intervention  Provide education and explanation of THRR including how the numbers were predicted and where they are located for reference       Expected Outcomes  Short Term: Able to state/look up THRR;Short Term: Able to use daily as guideline for intensity in rehab;Long Term: Able to use THRR to govern intensity when exercising independently       Able to check pulse independently  Yes       Intervention  Provide education and demonstration on how to check pulse in carotid and radial arteries.;Review the importance of being able to check your own pulse for safety during independent exercise       Expected Outcomes  Short Term: Able to explain why pulse  checking is important during independent exercise;Long Term: Able to check pulse independently and accurately       Understanding of Exercise Prescription  Yes       Intervention  Provide education, explanation, and written materials on patient's individual exercise prescription       Expected Outcomes  Short Term: Able to explain program exercise prescription;Long Term: Able to explain home exercise prescription to exercise independently          Exercise Goals Re-Evaluation : Exercise Goals Re-Evaluation    Row Name 05/10/18 1619 05/22/18 1103 05/28/18 1724 06/07/18 0820 06/21/18 0935     Exercise Goal Re-Evaluation   Exercise Goals Review  Able to understand and use rate of perceived exertion (RPE) scale;Knowledge and understanding of Target Heart Rate Range  (THRR);Understanding of Exercise Prescription  Increase Physical Activity;Increase Strength and Stamina;Able to understand and use rate of perceived exertion (RPE) scale  Increase Physical Activity;Able to understand and use rate of perceived exertion (RPE) scale;Knowledge and understanding of Target Heart Rate Range (THRR);Understanding of Exercise Prescription;Increase Strength and Stamina;Able to check pulse independently  Increase Physical Activity;Increase Strength and Stamina;Able to understand and use rate of perceived exertion (RPE) scale;Knowledge and understanding of Target Heart Rate Range (THRR);Understanding of Exercise Prescription  Increase Physical Activity;Increase Strength and Stamina;Able to understand and use rate of perceived exertion (RPE) scale;Knowledge and understanding of Target Heart Rate Range (THRR);Understanding of Exercise Prescription   Comments  Reviewed RPE scale, THR and program prescription with pt today.  Pt voiced understanding and was given a copy of goals to take home.   Akiel has tolerated exercsie well so far.  He can complete 45 min continuous without symptoms.  Staff will monitor progress.  Reviewed home exercise with pt today.  Pt plans to walk at home and mall for exercise.  Reviewed THR, pulse, RPE, sign and symptoms, NTG use, and when to call 911 or MD.  Also discussed weather considerations and indoor options.  Pt voiced understanding.  Mehdi has made small increase in MET level.  He attends consistently and works in correct THR and RPE range.    Yurem has improved his MET level.  He continues to attend consistently and work at prescribed HR and RPE levels.   Expected Outcomes  Short: Use RPE daily to regulate intensity. Long: Follow program prescription in THR.  Short - attend class consistently Long - increase overall MET level  Short- add 2 days of walking at home. Long- Exercise 5 days per week even after completion of program.  Short - increase weight  strength training, resistance on machines Long - increase overall MET level  Short - complete HT program Long - continue exercise on his own      Discharge Exercise Prescription (Final Exercise Prescription Changes): Exercise Prescription Changes - 06/21/18 0900      Response to Exercise   Blood Pressure (Admit)  130/60    Blood Pressure (Exercise)  140/60    Blood Pressure (Exit)  124/68    Heart Rate (Admit)  90 bpm    Heart Rate (Exercise)  121 bpm    Heart Rate (Exit)  96 bpm    Rating of Perceived Exertion (Exercise)  13    Symptoms  none    Duration  Continue with 45 min of aerobic exercise without signs/symptoms of physical distress.    Intensity  THRR unchanged      Progression   Progression  Continue to progress workloads to maintain intensity without  signs/symptoms of physical distress.    Average METs  3.3      Resistance Training   Training Prescription  Yes    Weight  3 lb    Reps  10-15      Interval Training   Interval Training  No      Treadmill   MPH  3    Grade  1    Minutes  15    METs  3.71      NuStep   Level  3    SPM  80    Minutes  15    METs  4.2      Recumbant Elliptical   Level  1.5    RPM  50    Minutes  15    METs  2.1       Nutrition:  Target Goals: Understanding of nutrition guidelines, daily intake of sodium '1500mg'$ , cholesterol '200mg'$ , calories 30% from fat and 7% or less from saturated fats, daily to have 5 or more servings of fruits and vegetables.  Biometrics: Pre Biometrics - 05/07/18 1303      Pre Biometrics   Height  5' 4.5" (1.638 m)    Weight  196 lb 6.4 oz (89.1 kg)    Waist Circumference  41 inches    Hip Circumference  40.75 inches    Waist to Hip Ratio  1.01 %    BMI (Calculated)  33.2    Single Leg Stand  14.34 seconds      Post Biometrics - 06/28/18 1625       Post  Biometrics   Height  5' 4.5" (1.638 m)    Weight  192 lb (87.1 kg)    Waist Circumference  40.25 inches    Hip Circumference  40.75  inches    Waist to Hip Ratio  0.99 %    BMI (Calculated)  32.46    Single Leg Stand  28.82 seconds       Nutrition Therapy Plan and Nutrition Goals: Nutrition Therapy & Goals - 05/14/18 1654      Nutrition Therapy   Diet  TLC    Drug/Food Interactions  Statins/Certain Fruits    Protein (specify units)  8-9oz    Fiber  35 grams    Whole Grain Foods  3 servings   does not typically choose whole grains   Saturated Fats  15 max. grams    Fruits and Vegetables  4 servings/day   8 ideal; eats fruits but does not usually eat vegetables   Sodium  1500 grams      Personal Nutrition Goals   Nutrition Goal  At breakfast, add a protein source to your sweet bread such as an egg or [place] bacon, rather than eating the sweet bread on its own    Personal Goal #2  At meal times, try to include a vegetable at least one more day per week, with a long-term goal of having a larger portion of vegetables on your plate than starch (rice)    Personal Goal #3  Great job for starting to choose leaner meats (less pork/red meat)!    Personal Goal #4  Drink one less glass of juice per day, and replace it with water    Comments  HgbA1c (02/2018) 6.3 H. He reports to be unaware of this number. Typically eats 1 meal/day + a sweet bread and coffee with milk in the morning. For dinner he eats at home half of the time and  out at restaurants half of the time. He feels that he "eats too much rice" and eats very few vegetables. Since his surgery he has been eating less red meat and pork, more chicken, which he bakes/grills and sometimes fries. Beverages: coffee, juice, water. He thinks he has been drinking more juice as of late. Snacks: fruit, [place] sandwich.      Intervention Plan   Intervention  Prescribe, educate and counsel regarding individualized specific dietary modifications aiming towards targeted core components such as weight, hypertension, lipid management, diabetes, heart failure and other comorbidities.     Expected Outcomes  Short Term Goal: Understand basic principles of dietary content, such as calories, fat, sodium, cholesterol and nutrients.;Long Term Goal: Adherence to prescribed nutrition plan.;Short Term Goal: A plan has been developed with personal nutrition goals set during dietitian appointment.       Nutrition Assessments: Nutrition Assessments - 05/07/18 1142      MEDFICTS Scores   Pre Score  36       Nutrition Goals Re-Evaluation: Nutrition Goals Re-Evaluation    Row Name 05/14/18 1706 06/20/18 1754           Goals   Nutrition Goal  At breakfast, add a protein source to your sweet bread such as an egg or [place] bacon, rather than eating the sweet bread on its own  -      Comment  He has been eating sweet breads with coffee and juice for breakfast lately; last A1c 6.3  Jeptha is having less greasy food and ha slost 4 lb      Expected Outcome  He will add a protein source to his breakfast, or choose a whole grain bread option rather than a 'sweet bread.'  Short - continue to eat less greasy food Long - reach goal weight        Personal Goal #2 Re-Evaluation   Personal Goal #2  At meal times, try to include a vegetable at least one more day per week, with a long term goal of having a larger portion of vegetables on your plate than starch (rice) Plate method reviewed  -        Personal Goal #3 Re-Evaluation   Personal Goal #3  Great job for starting to choose leaner meats (less pork/ red meats!)  -        Personal Goal #4 Re-Evaluation   Personal Goal #4  Drink one less glass of juice per day, and replace it with water  -         Nutrition Goals Discharge (Final Nutrition Goals Re-Evaluation): Nutrition Goals Re-Evaluation - 06/20/18 1754      Goals   Comment  Zidane is having less greasy food and ha slost 4 lb    Expected Outcome  Short - continue to eat less greasy food Long - reach goal weight       Psychosocial: Target Goals: Acknowledge presence or absence of  significant depression and/or stress, maximize coping skills, provide positive support system. Participant is able to verbalize types and ability to use techniques and skills needed for reducing stress and depression.   Initial Review & Psychosocial Screening: Initial Psych Review & Screening - 05/07/18 1134      Initial Review   Current issues with  None Identified      Family Dynamics   Good Support System?  Yes   Wife and children     Barriers   Psychosocial barriers to participate in program  There are  no identifiable barriers or psychosocial needs.;The patient should benefit from training in stress management and relaxation.      Screening Interventions   Interventions  Encouraged to exercise;Program counselor consult;To provide support and resources with identified psychosocial needs;Provide feedback about the scores to participant    Expected Outcomes  Short Term goal: Utilizing psychosocial counselor, staff and physician to assist with identification of specific Stressors or current issues interfering with healing process. Setting desired goal for each stressor or current issue identified.;Long Term Goal: Stressors or current issues are controlled or eliminated.;Short Term goal: Identification and review with participant of any Quality of Life or Depression concerns found by scoring the questionnaire.;Long Term goal: The participant improves quality of Life and PHQ9 Scores as seen by post scores and/or verbalization of changes       Quality of Life Scores:  Quality of Life - 06/28/18 1802      Quality of Life   Select  Quality of Life      Quality of Life Scores   Health/Function Post  23.29 %    Socioeconomic Post  23.29 %    Psych/Spiritual Post  25.75 %    Family Post  28.5 %    GLOBAL Post  24.3 %      Scores of 19 and below usually indicate a poorer quality of life in these areas.  A difference of  2-3 points is a clinically meaningful difference.  A difference of 2-3  points in the total score of the Quality of Life Index has been associated with significant improvement in overall quality of life, self-image, physical symptoms, and general health in studies assessing change in quality of life.  PHQ-9: Recent Review Flowsheet Data    Depression screen [facility] 2/9 06/28/2018 05/07/2018   Decreased Interest 0 3   Down, Depressed, Hopeless 0 1   PHQ - 2 Score 0 4   Altered sleeping 0 1   Tired, decreased energy 0 1   Change in appetite 1 2   Feeling bad or failure about yourself  0 0   Trouble concentrating 1 0   Moving slowly or fidgety/restless 2 2   Suicidal thoughts 0 0   PHQ-9 Score 4 10   Difficult doing work/chores Not difficult at all Somewhat difficult     Interpretation of Total Score  Total Score Depression Severity:  1-4 = Minimal depression, 5-9 = Mild depression, 10-14 = Moderate depression, 15-19 = Moderately severe depression, 20-27 = Severe depression   Psychosocial Evaluation and Intervention: Psychosocial Evaluation - 05/14/18 1725      Psychosocial Evaluation & Interventions   Interventions  Stress management education;Relaxation education;Encouraged to exercise with the program and follow exercise prescription    Comments  Counselor met with Mr. Morones [place]) today for initial psychosocial evaluation.  He was accompanied by an interpreter since Josian is hispanic.  He is a 63 year old who had open heart surgery and a heart cathe on 03/14/18.  He has a strong support system with a spouse of 30 years and (3) adult children locally.  He reports sleeping only about 5 hours/night and has a good appetite.  He denies a history of depression or anxiety or any current symptoms; but admits that he "comfort eats" at times when he is a little anxious.  Dawood states his mood is generally positive most of the time.  He has some stress currently with his 65 year old father who is sick and 57 year old mother  being so far away - in [place].  He is also  concerned about his own health.  Le has goals to have a stronger heart and possibly lose some weight while in this program.  He will be followed by staff.     Expected Outcomes  Short:   Aeson will meet with the dietician to address his weight loss goal.  He will also exercise for stress reduction and for his physical health.  Long:  Dehaven will develop a routine of positive self-care practicies for his health and his mental health.     Continue Psychosocial Services   Follow up required by staff       Psychosocial Re-Evaluation: Psychosocial Re-Evaluation    [place] Name 06/20/18 1752             Psychosocial Re-Evaluation   Current issues with  Current Stress Concerns       Comments  Raineri main stress is his dad, who is in poor health in [place].  he is going to see him in a couple weeks.       Expected Outcomes  Short - speak with counselor if furhter concerns Long - manage stress on his own       Interventions  Encouraged to attend Cardiac Rehabilitation for the exercise       Continue Psychosocial Services   Follow up required by staff          Psychosocial Discharge (Final Psychosocial Re-Evaluation): Psychosocial Re-Evaluation - 06/20/18 1752      Psychosocial Re-Evaluation   Current issues with  Current Stress Concerns    Comments  Vivero main stress is his dad, who is in poor health in [place].  he is going to see him in a couple weeks.    Expected Outcomes  Short - speak with counselor if furhter concerns Long - manage stress on his own    Interventions  Encouraged to attend Cardiac Rehabilitation for the exercise    Continue Psychosocial Services   Follow up required by staff       Vocational Rehabilitation: Provide vocational rehab assistance to qualifying candidates.   Vocational Rehab Evaluation & Intervention: Vocational Rehab - 05/07/18 1143      Initial Vocational Rehab Evaluation & Intervention   Assessment shows need for Vocational Rehabilitation  No        Education: Education Goals: Education classes will be provided on a variety of topics geared toward better understanding of heart health and risk factor modification. Participant will state understanding/return demonstration of topics presented as noted by education test scores.  Learning Barriers/Preferences: Learning Barriers/Preferences - 05/07/18 1142      Learning Barriers/Preferences   Learning Barriers  Sight;Language   interpreter to be at class   Learning Preferences  Skilled Demonstration       Education Topics:  AED/CPR: - Group verbal and written instruction with the use of models to demonstrate the basic use of the AED with the basic ABC's of resuscitation.   Cardiac Rehab from 06/27/2018 in [facility] Cardiac and Pulmonary Rehab  Date  05/28/18  Educator  CE  Instruction Review Code  1- Verbalizes Understanding      General Nutrition Guidelines/Fats and Fiber: -Group instruction provided by verbal, written material, models and posters to present the general guidelines for heart healthy nutrition. Gives an explanation and review of dietary fats and fiber.   Cardiac Rehab from 06/27/2018 in [facility] Cardiac and Pulmonary Rehab  Date  06/11/18  Educator  LB  Instruction Review Code  1- Verbalizes Understanding      Controlling Sodium/Reading Food Labels: -Group verbal and written material supporting the discussion of sodium use in heart healthy nutrition. Review and explanation with models, verbal and written materials for utilization of the food label.   Cardiac Rehab from 06/27/2018 in [facility] Cardiac and Pulmonary Rehab  Date  06/13/18  Educator  LB  Instruction Review Code  1- Verbalizes Understanding      Exercise Physiology & General Exercise Guidelines: - Group verbal and written instruction with models to review the exercise physiology of the cardiovascular system and associated critical values. Provides general exercise guidelines with specific guidelines to those  with heart or lung disease.    Cardiac Rehab from 06/27/2018 in [facility] Cardiac and Pulmonary Rehab  Date  06/18/18  Educator  [facility]  Instruction Review Code  1- Verbalizes Understanding      Aerobic Exercise & Resistance Training: - Gives group verbal and written instruction on the various components of exercise. Focuses on aerobic and resistive training programs and the benefits of this training and how to safely progress through these programs..   Cardiac Rehab from 06/27/2018 in [facility] Cardiac and Pulmonary Rehab  Date  06/20/18  Educator  AS  Instruction Review Code  1- Verbalizes Understanding      Flexibility, Balance, Mind/Body Relaxation: Provides group verbal/written instruction on the benefits of flexibility and balance training, including mind/body exercise modes such as yoga, pilates and tai chi.  Demonstration and skill practice provided.   Cardiac Rehab from 06/27/2018 in [facility] Cardiac and Pulmonary Rehab  Date  06/25/18  Educator  AS  Instruction Review Code  1- Verbalizes Understanding      Stress and Anxiety: - Provides group verbal and written instruction about the health risks of elevated stress and causes of high stress.  Discuss the correlation between heart/lung disease and anxiety and treatment options. Review healthy ways to manage with stress and anxiety.   Depression: - Provides group verbal and written instruction on the correlation between heart/lung disease and depressed mood, treatment options, and the stigmas associated with seeking treatment.   Cardiac Rehab from 06/27/2018 in [facility] Cardiac and Pulmonary Rehab  Date  06/06/18  Educator  [facility]  Instruction Review Code  1- Verbalizes Understanding      Anatomy & Physiology of the Heart: - Group verbal and written instruction and models provide basic cardiac anatomy and physiology, with the coronary electrical and arterial systems. Review of Valvular disease and Heart Failure   Cardiac Procedures: - Group  verbal and written instruction to review commonly prescribed medications for heart disease. Reviews the medication, class of the drug, and side effects. Includes the steps to properly store meds and maintain the prescription regimen. (beta blockers and nitrates)   Cardiac Medications I: - Group verbal and written instruction to review commonly prescribed medications for heart disease. Reviews the medication, class of the drug, and side effects. Includes the steps to properly store meds and maintain the prescription regimen.   Cardiac Medications II: -Group verbal and written instruction to review commonly prescribed medications for heart disease. Reviews the medication, class of the drug, and side effects. (all other drug classes)   Cardiac Rehab from 06/27/2018 in [facility] Cardiac and Pulmonary Rehab  Date  06/27/18  Educator  [facility]  Instruction Review Code  1- Verbalizes Understanding       Go Sex-Intimacy & Heart Disease, Get SMART - Goal Setting: - Group verbal and written instruction through  game format to discuss heart disease and the return to sexual intimacy. Provides group verbal and written material to discuss and apply goal setting through the application of the S.M.A.R.T. Method.   Other Matters of the Heart: - Provides group verbal, written materials and models to describe Stable Angina and Peripheral Artery. Includes description of the disease process and treatment options available to the cardiac patient.   Exercise & Equipment Safety: - Individual verbal instruction and demonstration of equipment use and safety with use of the equipment.   Cardiac Rehab from 06/27/2018 in [facility] Cardiac and Pulmonary Rehab  Date  05/07/18  Educator  SB  Instruction Review Code  1- Verbalizes Understanding      Infection Prevention: - Provides verbal and written material to individual with discussion of infection control including proper hand washing and proper equipment cleaning during  exercise session.   Cardiac Rehab from 06/27/2018 in [facility] Cardiac and Pulmonary Rehab  Date  05/07/18  Educator  SB  Instruction Review Code  1- Verbalizes Understanding      Falls Prevention: - Provides verbal and written material to individual with discussion of falls prevention and safety.   Cardiac Rehab from 06/27/2018 in [facility] Cardiac and Pulmonary Rehab  Date  05/07/18  Educator  SB  Instruction Review Code  1- Verbalizes Understanding      Diabetes: - Individual verbal and written instruction to review signs/symptoms of diabetes, desired ranges of glucose level fasting, after meals and with exercise. Acknowledge that pre and post exercise glucose checks will be done for 3 sessions at entry of program.   Know Your Numbers and Risk Factors: -Group verbal and written instruction about important numbers in your health.  Discussion of what are risk factors and how they play a role in the disease process.  Review of Cholesterol, Blood Pressure, Diabetes, and BMI and the role they play in your overall health.   Cardiac Rehab from 06/27/2018 in [facility] Cardiac and Pulmonary Rehab  Date  06/27/18  Educator  [facility]  Instruction Review Code  1- Verbalizes Understanding      Sleep Hygiene: -Provides group verbal and written instruction about how sleep can affect your health.  Define sleep hygiene, discuss sleep cycles and impact of sleep habits. Review good sleep hygiene tips.    Other: -Provides group and verbal instruction on various topics (see comments)   Knowledge Questionnaire Score: Knowledge Questionnaire Score - 05/07/18 1142      Knowledge Questionnaire Score   Pre Score  24/26   Reviewed correct responses today with Karlin and his family member. Verbalized understanding and no questions were asked      Core Components/Risk Factors/Patient Goals at Admission: Personal Goals and Risk Factors at Admission - 05/07/18 1143      Core Components/Risk Factors/Patient Goals on  Admission    Weight Management  Yes;Weight Loss    Admit Weight  196 lb 6.4 oz (89.1 kg)    Goal Weight: Short Term  194 lb (88 kg)    Goal Weight: Long Term  180 lb (81.6 kg)    Expected Outcomes  Short Term: Continue to assess and modify interventions until short term weight is achieved;Long Term: Adherence to nutrition and physical activity/exercise program aimed toward attainment of established weight goal;Weight Loss: Understanding of general recommendations for a balanced deficit meal plan, which promotes 1-2 lb weight loss per week and includes a negative energy balance of [phone] kcal/d    Hypertension  Yes    Intervention  Provide education on lifestyle modifcations including regular physical activity/exercise, weight management, moderate sodium restriction and increased consumption of fresh fruit, vegetables, and low fat dairy, alcohol moderation, and smoking cessation.;Monitor prescription use compliance.    Expected Outcomes  Short Term: Continued assessment and intervention until BP is < 140/42m HG in hypertensive participants. < 130/878mHG in hypertensive participants with diabetes, heart failure or chronic kidney disease.;Long Term: Maintenance of blood pressure at goal levels.    Lipids  Yes    Intervention  Provide education and support for participant on nutrition & aerobic/resistive exercise along with prescribed medications to achieve LDL '70mg'$ , HDL >'40mg'$ .    Expected Outcomes  Short Term: Participant states understanding of desired cholesterol values and is compliant with medications prescribed. Participant is following exercise prescription and nutrition guidelines.;Long Term: Cholesterol controlled with medications as prescribed, with individualized exercise RX and with personalized nutrition plan. Value goals: LDL < '70mg'$ , HDL > 40 mg.       Core Components/Risk Factors/Patient Goals Review:  Goals and Risk Factor Review    Row Name 06/20/18 1746             Core  Components/Risk Factors/Patient Goals Review   Personal Goals Review  Weight Management/Obesity;Lipids;Hypertension       Review  WaReinos taking meds as directed.  He has a wrist cuff at home but feels like its not always accurate.  He sleeps 4-6 hours per night - only feels sleepy if he is not active.  He states he wakes up and doesnt feel he sleepy anymore so he gets up.  he feels refreshed not tired.         Expected Outcomes  Short - Continue to take meds as directed and attend [facility] - maintain healthy habits          Core Components/Risk Factors/Patient Goals at Discharge (Final Review):  Goals and Risk Factor Review - 06/20/18 1746      Core Components/Risk Factors/Patient Goals Review   Personal Goals Review  Weight Management/Obesity;Lipids;Hypertension    Review  Wa[place] taking meds as directed.  He has a wrist cuff at home but feels like its not always accurate.  He sleeps 4-6 hours per night - only feels sleepy if he is not active.  He states he wakes up and doesnt feel he sleepy anymore so he gets up.  he feels refreshed not tired.      Expected Outcomes  Short - Continue to take meds as directed and attend [facility] - maintain healthy habits       ITP Comments: ITP Comments    Row Name 05/07/18 1305 05/29/18 0658 06/27/18 1414       ITP Comments  Medical evaluation completed with interpreter present.  [facility]  Initial ITP created and sent for review and signature to Dr. MaEmily FilbertMedical Director.   30 Day Review. Continue with ITP unless directed changes per Medical Director review. New to program.  30 day review completed. ITP sent to Dr. MaEmily FilbertMedical Director of Cardiac Rehab. Continue with ITP unless changes are made by physician.        Comments: Discharge ITP

## 2018-06-28 NOTE — Progress Notes (Signed)
Discharge Progress Report  Patient Details  Name: James Yates MRN: [id number] Date of Birth: May 25, 1956 Referring Provider:     Cardiac Rehab from 05/07/2018 in [facility]  Referring Provider  [name] MD       Number of Visits: 36  Reason for Discharge:  Patient reached a stable level of exercise. Patient independent in their exercise.  Smoking History:  Social History   Tobacco Use  Smoking Status Never Smoker  Smokeless Tobacco Never Used    Diagnosis:  S/P TAVR (transcatheter aortic valve replacement)  ADL [facility]:   Initial Exercise Prescription: Initial Exercise Prescription - 05/07/18 1300      Date of Initial Exercise RX and Referring Provider   Date  05/07/18    Referring Provider  [name] MD      Treadmill   MPH  2.1    Grade  0.5    Minutes  15    METs  2.75      NuStep   Level  2    SPM  80    Minutes  15    METs  2.5      Recumbant Elliptical   Level  1    RPM  50    Minutes  15    METs  2.5      Prescription Details   Frequency (times per week)  3    Duration  Progress to 45 minutes of aerobic exercise without signs/symptoms of physical distress      Intensity   THRR 40-80% of Max Heartrate  110-143    Ratings of Perceived Exertion  11-13    Perceived Dyspnea  0-4      Progression   Progression  Continue to progress workloads to maintain intensity without signs/symptoms of physical distress.      Resistance Training   Training Prescription  Yes    Weight  3 lbs    Reps  10-15       Discharge Exercise Prescription (Final Exercise Prescription Changes): Exercise Prescription Changes - 06/21/18 0900      Response to Exercise   Blood Pressure (Admit)  130/60    Blood Pressure (Exercise)  140/60    Blood Pressure (Exit)  124/68    Heart Rate (Admit)  90 bpm    Heart Rate (Exercise)  121 bpm    Heart Rate (Exit)  96 bpm    Rating of Perceived Exertion (Exercise)  13    Symptoms  none     Duration  Continue with 45 min of aerobic exercise without signs/symptoms of physical distress.    Intensity  THRR unchanged      Progression   Progression  Continue to progress workloads to maintain intensity without signs/symptoms of physical distress.    Average METs  3.3      Resistance Training   Training Prescription  Yes    Weight  3 lb    Reps  10-15      Interval Training   Interval Training  No      Treadmill   MPH  3    Grade  1    Minutes  15    METs  3.71      NuStep   Level  3    SPM  80    Minutes  15    METs  4.2      Recumbant Elliptical   Level  1.5    RPM  50  Minutes  15    METs  2.1       Functional Capacity: 6 Minute Walk    Row Name 05/07/18 1259 06/28/18 1626       6 Minute Walk   Phase  Initial  Discharge    Distance  1155 feet  1565 feet    Distance % Change  -  35.4 %    Distance Feet Change  -  410 ft    Walk Time  6 minutes  6 minutes    # of Rest Breaks  0  0    MPH  2.19  2.96    METS  2.81  3.9    RPE  9  12    Perceived Dyspnea   1  -    VO2 Peak  9.85  13.6    Symptoms  Yes (comment)  No    Comments  SOB, knee pain 4/10  -    Resting HR  77 bpm  95 bpm    Resting BP  136/64  144/80    Resting Oxygen Saturation   98 %  -    Exercise Oxygen Saturation  during 6 min walk  100 %  -    Max Ex. HR  98 bpm  124 bpm    Max Ex. BP  138/76  144/72    2 Minute Post BP  128/62  -       Psychological, QOL, Others - Outcomes: PHQ 2/9: Depression screen [facility] 2/9 06/28/2018 05/07/2018  Decreased Interest 0 3  Down, Depressed, Hopeless 0 1  PHQ - 2 Score 0 4  Altered sleeping 0 1  Tired, decreased energy 0 1  Change in appetite 1 2  Feeling bad or failure about yourself  0 0  Trouble concentrating 1 0  Moving slowly or fidgety/restless 2 2  Suicidal thoughts 0 0  PHQ-9 Score 4 10  Difficult doing work/chores Not difficult at all Somewhat difficult    Quality of Life: Quality of Life - 06/28/18 1802      Quality of  Life   Select  Quality of Life      Quality of Life Scores   Health/Function Post  23.29 %    Socioeconomic Post  23.29 %    Psych/Spiritual Post  25.75 %    Family Post  28.5 %    GLOBAL Post  24.3 %       Personal Goals: Goals established at orientation with interventions provided to work toward goal. Personal Goals and Risk Factors at Admission - 05/07/18 1143      Core Components/Risk Factors/Patient Goals on Admission    Weight Management  Yes;Weight Loss    Admit Weight  196 lb 6.4 oz (89.1 kg)    Goal Weight: Short Term  194 lb (88 kg)    Goal Weight: Long Term  180 lb (81.6 kg)    Expected Outcomes  Short Term: Continue to assess and modify interventions until short term weight is achieved;Long Term: Adherence to nutrition and physical activity/exercise program aimed toward attainment of established weight goal;Weight Loss: Understanding of general recommendations for a balanced deficit meal plan, which promotes 1-2 lb weight loss per week and includes a negative energy balance of [phone] kcal/d    Hypertension  Yes    Intervention  Provide education on lifestyle modifcations including regular physical activity/exercise, weight management, moderate sodium restriction and increased consumption of fresh fruit, vegetables, and low fat dairy, alcohol moderation,  and smoking cessation.;Monitor prescription use compliance.    Expected Outcomes  Short Term: Continued assessment and intervention until BP is < 140/31m HG in hypertensive participants. < 130/812mHG in hypertensive participants with diabetes, heart failure or chronic kidney disease.;Long Term: Maintenance of blood pressure at goal levels.    Lipids  Yes    Intervention  Provide education and support for participant on nutrition & aerobic/resistive exercise along with prescribed medications to achieve LDL <7010mHDL >44m49m  Expected Outcomes  Short Term: Participant states understanding of desired cholesterol values and is  compliant with medications prescribed. Participant is following exercise prescription and nutrition guidelines.;Long Term: Cholesterol controlled with medications as prescribed, with individualized exercise RX and with personalized nutrition plan. Value goals: LDL < 70mg72mL > 40 mg.        Personal Goals Discharge: Goals and Risk Factor Review    Row Name 06/20/18 1746             Core Components/Risk Factors/Patient Goals Review   Personal Goals Review  Weight Management/Obesity;Lipids;Hypertension       Review  [place] meds as directed.  He has a wrist cuff at home but feels like its not always accurate.  He sleeps 4-6 hours per night - only feels sleepy if he is not active.  He states he wakes up and doesnt feel he sleepy anymore so he gets up.  he feels refreshed not tired.         Expected Outcomes  Short - Continue to take meds as directed and attend HT Long - maintain healthy habits          Exercise Goals and Review: Exercise Goals    Row Name 05/07/18 1302             Exercise Goals   Increase Physical Activity  Yes       Intervention  Provide advice, education, support and counseling about physical activity/exercise needs.;Develop an individualized exercise prescription for aerobic and resistive training based on initial evaluation findings, risk stratification, comorbidities and participant's personal goals.       Expected Outcomes  Short Term: Attend rehab on a regular basis to increase amount of physical activity.;Long Term: Add in home exercise to make exercise part of routine and to increase amount of physical activity.;Long Term: Exercising regularly at least 3-5 days a week.       Increase Strength and Stamina  Yes       Intervention  Provide advice, education, support and counseling about physical activity/exercise needs.;Develop an individualized exercise prescription for aerobic and resistive training based on initial evaluation findings, risk  stratification, comorbidities and participant's personal goals.       Expected Outcomes  Short Term: Increase workloads from initial exercise prescription for resistance, speed, and METs.;Short Term: Perform resistance training exercises routinely during rehab and add in resistance training at home;Long Term: Improve cardiorespiratory fitness, muscular endurance and strength as measured by increased METs and functional capacity (6MWT)       Able to understand and use rate of perceived exertion (RPE) scale  Yes       Intervention  Provide education and explanation on how to use RPE scale       Expected Outcomes  Short Term: Able to use RPE daily in rehab to express subjective intensity level;Long Term:  Able to use RPE to guide intensity level when exercising independently       Knowledge and understanding of Target Heart  Rate Range (THRR)  Yes       Intervention  Provide education and explanation of THRR including how the numbers were predicted and where they are located for reference       Expected Outcomes  Short Term: Able to state/look up THRR;Short Term: Able to use daily as guideline for intensity in rehab;Long Term: Able to use THRR to govern intensity when exercising independently       Able to check pulse independently  Yes       Intervention  Provide education and demonstration on how to check pulse in carotid and radial arteries.;Review the importance of being able to check your own pulse for safety during independent exercise       Expected Outcomes  Short Term: Able to explain why pulse checking is important during independent exercise;Long Term: Able to check pulse independently and accurately       Understanding of Exercise Prescription  Yes       Intervention  Provide education, explanation, and written materials on patient's individual exercise prescription       Expected Outcomes  Short Term: Able to explain program exercise prescription;Long Term: Able to explain home exercise  prescription to exercise independently          Exercise Goals Re-Evaluation: Exercise Goals Re-Evaluation    Row Name 05/10/18 1619 05/22/18 1103 05/28/18 1724 06/07/18 0820 06/21/18 0935     Exercise Goal Re-Evaluation   Exercise Goals Review  Able to understand and use rate of perceived exertion (RPE) scale;Knowledge and understanding of Target Heart Rate Range (THRR);Understanding of Exercise Prescription  Increase Physical Activity;Increase Strength and Stamina;Able to understand and use rate of perceived exertion (RPE) scale  Increase Physical Activity;Able to understand and use rate of perceived exertion (RPE) scale;Knowledge and understanding of Target Heart Rate Range (THRR);Understanding of Exercise Prescription;Increase Strength and Stamina;Able to check pulse independently  Increase Physical Activity;Increase Strength and Stamina;Able to understand and use rate of perceived exertion (RPE) scale;Knowledge and understanding of Target Heart Rate Range (THRR);Understanding of Exercise Prescription  Increase Physical Activity;Increase Strength and Stamina;Able to understand and use rate of perceived exertion (RPE) scale;Knowledge and understanding of Target Heart Rate Range (THRR);Understanding of Exercise Prescription   Comments  Reviewed RPE scale, THR and program prescription with pt today.  Pt voiced understanding and was given a copy of goals to take home.   Lisandro has tolerated exercsie well so far.  He can complete 45 min continuous without symptoms.  Staff will monitor progress.  Reviewed home exercise with pt today.  Pt plans to walk at home and mall for exercise.  Reviewed THR, pulse, RPE, sign and symptoms, NTG use, and when to call 911 or MD.  Also discussed weather considerations and indoor options.  Pt voiced understanding.  Andru has made small increase in MET level.  He attends consistently and works in correct THR and RPE range.    Ganon has improved his MET level.  He continues  to attend consistently and work at prescribed HR and RPE levels.   Expected Outcomes  Short: Use RPE daily to regulate intensity. Long: Follow program prescription in THR.  Short - attend class consistently Long - increase overall MET level  Short- add 2 days of walking at home. Long- Exercise 5 days per week even after completion of program.  Short - increase weight strength training, resistance on machines Long - increase overall MET level  Short - complete HT program Long - continue exercise on his  own      Nutrition & Weight - Outcomes: Pre Biometrics - 05/07/18 1303      Pre Biometrics   Height  5' 4.5" (1.638 m)    Weight  196 lb 6.4 oz (89.1 kg)    Waist Circumference  41 inches    Hip Circumference  40.75 inches    Waist to Hip Ratio  1.01 %    BMI (Calculated)  33.2    Single Leg Stand  14.34 seconds      Post Biometrics - 06/28/18 1625       Post  Biometrics   Height  5' 4.5" (1.638 m)    Weight  192 lb (87.1 kg)    Waist Circumference  40.25 inches    Hip Circumference  40.75 inches    Waist to Hip Ratio  0.99 %    BMI (Calculated)  32.46    Single Leg Stand  28.82 seconds       Nutrition: Nutrition Therapy & Goals - 05/14/18 1654      Nutrition Therapy   Diet  TLC    Drug/Food Interactions  Statins/Certain Fruits    Protein (specify units)  8-9oz    Fiber  35 grams    Whole Grain Foods  3 servings   does not typically choose whole grains   Saturated Fats  15 max. grams    Fruits and Vegetables  4 servings/day   8 ideal; eats fruits but does not usually eat vegetables   Sodium  1500 grams      Personal Nutrition Goals   Nutrition Goal  At breakfast, add a protein source to your sweet bread such as an egg or [place] bacon, rather than eating the sweet bread on its own    Personal Goal #2  At meal times, try to include a vegetable at least one more day per week, with a long-term goal of having a larger portion of vegetables on your plate than starch (rice)     Personal Goal #3  Great job for starting to choose leaner meats (less pork/red meat)!    Personal Goal #4  Drink one less glass of juice per day, and replace it with water    Comments  HgbA1c (02/2018) 6.3 H. He reports to be unaware of this number. Typically eats 1 meal/day + a sweet bread and coffee with milk in the morning. For dinner he eats at home half of the time and out at restaurants half of the time. He feels that he "eats too much rice" and eats very few vegetables. Since his surgery he has been eating less red meat and pork, more chicken, which he bakes/grills and sometimes fries. Beverages: coffee, juice, water. He thinks he has been drinking more juice as of late. Snacks: fruit, [place] sandwich.      Intervention Plan   Intervention  Prescribe, educate and counsel regarding individualized specific dietary modifications aiming towards targeted core components such as weight, hypertension, lipid management, diabetes, heart failure and other comorbidities.    Expected Outcomes  Short Term Goal: Understand basic principles of dietary content, such as calories, fat, sodium, cholesterol and nutrients.;Long Term Goal: Adherence to prescribed nutrition plan.;Short Term Goal: A plan has been developed with personal nutrition goals set during dietitian appointment.       Nutrition Discharge: Nutrition Assessments - 05/07/18 1142      MEDFICTS Scores   Pre Score  36       Education Questionnaire Score: Knowledge  Questionnaire Score - 05/07/18 1142      Knowledge Questionnaire Score   Pre Score  24/26   Reviewed correct responses today with Racer and his family member. Verbalized understanding and no questions were asked      Goals reviewed with patient; copy given to patient.

## 2018-06-28 NOTE — Patient Instructions (Signed)
Discharge Patient Instructions  Patient Details  Name: James Yates MRN: [id number] Date of Birth: [birth year] Referring Provider:  Wellington Hampshire, MD   Number of Visits: 36  Reason for Discharge:  Patient reached a stable level of exercise. Patient independent in their exercise. Patient has met program and personal goals.  Smoking History:  Social History   Tobacco Use  Smoking Status Never Smoker  Smokeless Tobacco Never Used    Diagnosis:  S/P TAVR (transcatheter aortic valve replacement)  Initial Exercise Prescription: Initial Exercise Prescription - 05/07/18 1300      Date of Initial Exercise RX and Referring Provider   Date  05/07/18    Referring Provider  James Sacramento MD      Treadmill   MPH  2.1    Grade  0.5    Minutes  15    METs  2.75      NuStep   Level  2    SPM  80    Minutes  15    METs  2.5      Recumbant Elliptical   Level  1    RPM  50    Minutes  15    METs  2.5      Prescription Details   Frequency (times per week)  3    Duration  Progress to 45 minutes of aerobic exercise without signs/symptoms of physical distress      Intensity   THRR 40-80% of Max Heartrate  110-143    Ratings of Perceived Exertion  11-13    Perceived Dyspnea  0-4      Progression   Progression  Continue to progress workloads to maintain intensity without signs/symptoms of physical distress.      Resistance Training   Training Prescription  Yes    Weight  3 lbs    Reps  10-15       Discharge Exercise Prescription (Final Exercise Prescription Changes): Exercise Prescription Changes - 06/21/18 0900      Response to Exercise   Blood Pressure (Admit)  130/60    Blood Pressure (Exercise)  140/60    Blood Pressure (Exit)  124/68    Heart Rate (Admit)  90 bpm    Heart Rate (Exercise)  121 bpm    Heart Rate (Exit)  96 bpm    Rating of Perceived Exertion (Exercise)  13    Symptoms  none    Duration  Continue with 45 min of aerobic exercise without  signs/symptoms of physical distress.    Intensity  THRR unchanged      Progression   Progression  Continue to progress workloads to maintain intensity without signs/symptoms of physical distress.    Average METs  3.3      Resistance Training   Training Prescription  Yes    Weight  3 lb    Reps  10-15      Interval Training   Interval Training  No      Treadmill   MPH  3    Grade  1    Minutes  15    METs  3.71      NuStep   Level  3    SPM  80    Minutes  15    METs  4.2      Recumbant Elliptical   Level  1.5    RPM  50    Minutes  15    METs  2.1  Functional Capacity: 6 Minute Walk    Row Name 05/07/18 1259 06/28/18 1626       6 Minute Walk   Phase  Initial  Discharge    Distance  1155 feet  1565 feet    Distance % Change  -  35.4 %    Distance Feet Change  -  410 ft    Walk Time  6 minutes  6 minutes    # of Rest Breaks  0  0    MPH  2.19  2.96    METS  2.81  3.9    RPE  9  12    Perceived Dyspnea   1  -    VO2 Peak  9.85  13.6    Symptoms  Yes (comment)  No    Comments  SOB, knee pain 4/10  -    Resting HR  77 bpm  95 bpm    Resting BP  136/64  144/80    Resting Oxygen Saturation   98 %  -    Exercise Oxygen Saturation  during 6 min walk  100 %  -    Max Ex. HR  98 bpm  124 bpm    Max Ex. BP  138/76  144/72    2 Minute Post BP  128/62  -       Quality of Life: Quality of Life - 05/07/18 1134      Quality of Life   Select  Quality of Life      Quality of Life Scores   Health/Function Pre  25.5 %    Socioeconomic Pre  23.25 %    Psych/Spiritual Pre  24.58 %    Family Pre  28.8 %    GLOBAL Pre  25.29 %       Personal Goals: Goals established at orientation with interventions provided to work toward goal. Personal Goals and Risk Factors at Admission - 05/07/18 1143      Core Components/Risk Factors/Patient Goals on Admission    Weight Management  Yes;Weight Loss    Admit Weight  196 lb 6.4 oz (89.1 kg)    Goal Weight: Short  Term  194 lb (88 kg)    Goal Weight: Long Term  180 lb (81.6 kg)    Expected Outcomes  Short Term: Continue to assess and modify interventions until short term weight is achieved;Long Term: Adherence to nutrition and physical activity/exercise program aimed toward attainment of established weight goal;Weight Loss: Understanding of general recommendations for a balanced deficit meal plan, which promotes 1-2 lb weight loss per week and includes a negative energy balance of [phone] kcal/d    Hypertension  Yes    Intervention  Provide education on lifestyle modifcations including regular physical activity/exercise, weight management, moderate sodium restriction and increased consumption of fresh fruit, vegetables, and low fat dairy, alcohol moderation, and smoking cessation.;Monitor prescription use compliance.    Expected Outcomes  Short Term: Continued assessment and intervention until BP is < 140/79m HG in hypertensive participants. < 130/819mHG in hypertensive participants with diabetes, heart failure or chronic kidney disease.;Long Term: Maintenance of blood pressure at goal levels.    Lipids  Yes    Intervention  Provide education and support for participant on nutrition & aerobic/resistive exercise along with prescribed medications to achieve LDL <7067mHDL >46m68m  Expected Outcomes  Short Term: Participant states understanding of desired cholesterol values and is compliant with medications prescribed. Participant is following exercise prescription and  nutrition guidelines.;Long Term: Cholesterol controlled with medications as prescribed, with individualized exercise RX and with personalized nutrition plan. Value goals: LDL < 9m, HDL > 40 mg.        Personal Goals Discharge: Goals and Risk Factor Review - 06/20/18 1746      Core Components/Risk Factors/Patient Goals Review   Personal Goals Review  Weight Management/Obesity;Lipids;Hypertension    Review  James Yates taking meds as directed.   He has a wrist cuff at home but feels like its not always accurate.  He sleeps 4-6 hours per night - only feels sleepy if he is not active.  He states he wakes up and doesnt feel he sleepy anymore so he gets up.  he feels refreshed not tired.      Expected Outcomes  Short - Continue to take meds as directed and attend HT Long - maintain healthy habits       Exercise Goals and Review: Exercise Goals    Row Name 05/07/18 1302             Exercise Goals   Increase Physical Activity  Yes       Intervention  Provide advice, education, support and counseling about physical activity/exercise needs.;Develop an individualized exercise prescription for aerobic and resistive training based on initial evaluation findings, risk stratification, comorbidities and participant's personal goals.       Expected Outcomes  Short Term: Attend rehab on a regular basis to increase amount of physical activity.;Long Term: Add in home exercise to make exercise part of routine and to increase amount of physical activity.;Long Term: Exercising regularly at least 3-5 days a week.       Increase Strength and Stamina  Yes       Intervention  Provide advice, education, support and counseling about physical activity/exercise needs.;Develop an individualized exercise prescription for aerobic and resistive training based on initial evaluation findings, risk stratification, comorbidities and participant's personal goals.       Expected Outcomes  Short Term: Increase workloads from initial exercise prescription for resistance, speed, and METs.;Short Term: Perform resistance training exercises routinely during rehab and add in resistance training at home;Long Term: Improve cardiorespiratory fitness, muscular endurance and strength as measured by increased METs and functional capacity (6MWT)       Able to understand and use rate of perceived exertion (RPE) scale  Yes       Intervention  Provide education and explanation on how to use  RPE scale       Expected Outcomes  Short Term: Able to use RPE daily in rehab to express subjective intensity level;Long Term:  Able to use RPE to guide intensity level when exercising independently       Knowledge and understanding of Target Heart Rate Range (THRR)  Yes       Intervention  Provide education and explanation of THRR including how the numbers were predicted and where they are located for reference       Expected Outcomes  Short Term: Able to state/look up THRR;Short Term: Able to use daily as guideline for intensity in rehab;Long Term: Able to use THRR to govern intensity when exercising independently       Able to check pulse independently  Yes       Intervention  Provide education and demonstration on how to check pulse in carotid and radial arteries.;Review the importance of being able to check your own pulse for safety during independent exercise       Expected Outcomes  Short Term: Able to explain why pulse checking is important during independent exercise;Long Term: Able to check pulse independently and accurately       Understanding of Exercise Prescription  Yes       Intervention  Provide education, explanation, and written materials on patient's individual exercise prescription       Expected Outcomes  Short Term: Able to explain program exercise prescription;Long Term: Able to explain home exercise prescription to exercise independently          Exercise Goals Re-Evaluation: Exercise Goals Re-Evaluation    Row Name 05/10/18 1619 05/22/18 1103 05/28/18 1724 06/07/18 0820 06/21/18 0935     Exercise Goal Re-Evaluation   Exercise Goals Review  Able to understand and use rate of perceived exertion (RPE) scale;Knowledge and understanding of Target Heart Rate Range (THRR);Understanding of Exercise Prescription  Increase Physical Activity;Increase Strength and Stamina;Able to understand and use rate of perceived exertion (RPE) scale  Increase Physical Activity;Able to understand  and use rate of perceived exertion (RPE) scale;Knowledge and understanding of Target Heart Rate Range (THRR);Understanding of Exercise Prescription;Increase Strength and Stamina;Able to check pulse independently  Increase Physical Activity;Increase Strength and Stamina;Able to understand and use rate of perceived exertion (RPE) scale;Knowledge and understanding of Target Heart Rate Range (THRR);Understanding of Exercise Prescription  Increase Physical Activity;Increase Strength and Stamina;Able to understand and use rate of perceived exertion (RPE) scale;Knowledge and understanding of Target Heart Rate Range (THRR);Understanding of Exercise Prescription   Comments  Reviewed RPE scale, THR and program prescription with pt today.  Pt voiced understanding and was given a copy of goals to take home.   Sasuke has tolerated exercsie well so far.  He can complete 45 min continuous without symptoms.  Staff will monitor progress.  Reviewed home exercise with pt today.  Pt plans to walk at home and mall for exercise.  Reviewed THR, pulse, RPE, sign and symptoms, NTG use, and when to call 911 or MD.  Also discussed weather considerations and indoor options.  Pt voiced understanding.  Donnelle has made small increase in MET level.  He attends consistently and works in correct THR and RPE range.    Pryce has improved his MET level.  He continues to attend consistently and work at prescribed HR and RPE levels.   Expected Outcomes  Short: Use RPE daily to regulate intensity. Long: Follow program prescription in THR.  Short - attend class consistently Long - increase overall MET level  Short- add 2 days of walking at home. Long- Exercise 5 days per week even after completion of program.  Short - increase weight strength training, resistance on machines Long - increase overall MET level  Short - complete HT program Long - continue exercise on his own      Nutrition & Weight - Outcomes: Pre Biometrics - 05/07/18 1303      Pre  Biometrics   Height  5' 4.5" (1.638 m)    Weight  196 lb 6.4 oz (89.1 kg)    Waist Circumference  41 inches    Hip Circumference  40.75 inches    Waist to Hip Ratio  1.01 %    BMI (Calculated)  33.2    Single Leg Stand  14.34 seconds      Post Biometrics - 06/28/18 1625       Post  Biometrics   Height  5' 4.5" (1.638 m)    Weight  192 lb (87.1 kg)    Waist Circumference  40.25 inches  Hip Circumference  40.75 inches    Waist to Hip Ratio  0.99 %    BMI (Calculated)  32.46    Single Leg Stand  28.82 seconds       Nutrition: Nutrition Therapy & Goals - 05/14/18 1654      Nutrition Therapy   Diet  TLC    Drug/Food Interactions  Statins/Certain Fruits    Protein (specify units)  8-9oz    Fiber  35 grams    Whole Grain Foods  3 servings   does not typically choose whole grains   Saturated Fats  15 max. grams    Fruits and Vegetables  4 servings/day   8 ideal; eats fruits but does not usually eat vegetables   Sodium  1500 grams      Personal Nutrition Goals   Nutrition Goal  At breakfast, add a protein source to your sweet bread such as an egg or [place] bacon, rather than eating the sweet bread on its own    Personal Goal #2  At meal times, try to include a vegetable at least one more day per week, with a long-term goal of having a larger portion of vegetables on your plate than starch (rice)    Personal Goal #3  Great job for starting to choose leaner meats (less pork/red meat)!    Personal Goal #4  Drink one less glass of juice per day, and replace it with water    Comments  HgbA1c (02/2018) 6.3 H. He reports to be unaware of this number. Typically eats 1 meal/day + a sweet bread and coffee with milk in the morning. For dinner he eats at home half of the time and out at restaurants half of the time. He feels that he "eats too much rice" and eats very few vegetables. Since his surgery he has been eating less red meat and pork, more chicken, which he bakes/grills and sometimes  fries. Beverages: coffee, juice, water. He thinks he has been drinking more juice as of late. Snacks: fruit, [place] sandwich.      Intervention Plan   Intervention  Prescribe, educate and counsel regarding individualized specific dietary modifications aiming towards targeted core components such as weight, hypertension, lipid management, diabetes, heart failure and other comorbidities.    Expected Outcomes  Short Term Goal: Understand basic principles of dietary content, such as calories, fat, sodium, cholesterol and nutrients.;Long Term Goal: Adherence to prescribed nutrition plan.;Short Term Goal: A plan has been developed with personal nutrition goals set during dietitian appointment.       Nutrition Discharge: Nutrition Assessments - 05/07/18 1142      MEDFICTS Scores   Pre Score  36       Education Questionnaire Score: Knowledge Questionnaire Score - 05/07/18 1142      Knowledge Questionnaire Score   Pre Score  24/26   Reviewed correct responses today with Jimi and his family member. Verbalized understanding and no questions were asked      Goals reviewed with patient; copy given to patient.

## 2018-06-28 NOTE — Progress Notes (Signed)
Daily Session Note  Patient Details  Name: James Yates MRN: [id number] Date of Birth: 1956/04/10 Referring Provider:     Cardiac Rehab from 05/07/2018 in [facility]  Referring Provider  [name] MD      Encounter Date: 06/28/2018  Check In:      Social History   Tobacco Use  Smoking Status Never Smoker  Smokeless Tobacco Never Used    Goals Met:  Independence with exercise equipment Exercise tolerated well No report of cardiac concerns or symptoms Strength training completed today  Goals Unmet:  Not Applicable  Comments:  6 Minute Walk    Row Name 05/07/18 1259         6 Minute Walk   Phase  Initial     Distance  1155 feet     Walk Time  6 minutes     # of Rest Breaks  0     MPH  2.19     METS  2.81     RPE  9     Perceived Dyspnea   1     VO2 Peak  9.85     Symptoms  Yes (comment)     Comments  SOB, knee pain 4/10     Resting HR  77 bpm     Resting BP  136/64     Resting Oxygen Saturation   98 %     Exercise Oxygen Saturation  during 6 min walk  100 %     Max Ex. HR  98 bpm     Max Ex. BP  138/76     2 Minute Post BP  128/62        [name] graduated today from  rehab with 32 sessions completed.  Details of the patient's exercise prescription and what He needs to do in order to continue the prescription and progress were discussed with patient.  Patient was given a copy of prescription and goals.  Patient verbalized understanding.  James Yates plans to continue to exercise by joining [employer].    Dr. Emily Filbert is Medical Director for [facility].

## 2018-07-10 ENCOUNTER — Other Ambulatory Visit: Payer: Self-pay | Admitting: Cardiovascular Disease

## 2018-07-10 NOTE — Telephone Encounter (Signed)
Please review for refill. Thanks!  

## 2018-07-10 NOTE — Telephone Encounter (Signed)
ERROR    This encounter was created in error - please disregard.

## 2018-07-24 ENCOUNTER — Encounter: Payer: Self-pay | Admitting: Cardiovascular Disease

## 2018-07-24 ENCOUNTER — Ambulatory Visit: Payer: BLUE CROSS/BLUE SHIELD | Admitting: Cardiovascular Disease

## 2018-07-24 VITALS — BP 140/78 | HR 93 | Ht 65.0 in | Wt 192.0 lb

## 2018-07-24 DIAGNOSIS — I10 Essential (primary) hypertension: Secondary | ICD-10-CM

## 2018-07-24 DIAGNOSIS — Z952 Presence of prosthetic heart valve: Secondary | ICD-10-CM | POA: Diagnosis not present

## 2018-07-24 DIAGNOSIS — E785 Hyperlipidemia, unspecified: Secondary | ICD-10-CM

## 2018-07-24 DIAGNOSIS — I25810 Atherosclerosis of coronary artery bypass graft(s) without angina pectoris: Secondary | ICD-10-CM

## 2018-07-24 MED ORDER — METOPROLOL TARTRATE 50 MG PO TABS: 50.0000 mg | ORAL_TABLET | Freq: Two times a day (BID) | ORAL | 1 refills | Status: DC

## 2018-07-24 MED ORDER — ATORVASTATIN CALCIUM 80 MG PO TABS: ORAL_TABLET | ORAL | 1 refills | Status: DC

## 2018-07-24 NOTE — Patient Instructions (Signed)
Medication Instructions:  No changes  If you need a refill on your cardiac medications before your next appointment, please call your pharmacy.   Lab work: None ordered  Testing/Procedures: None ordered  Follow-Up: At [facility], you and your health needs are our priority.  As part of our continuing mission to provide you with exceptional heart care, we have created designated Provider Care Teams.  These Care Teams include your primary Cardiologist (physician) and Advanced Practice Providers (APPs -  Physician Assistants and Nurse Practitioners) who all work together to provide you with the care you need, when you need it. You will need a follow up appointment in 4 months. You may see Dr. Arida or one of the following Advanced Practice Providers on your designated Care Team:   Christopher Berge, NP Ryan Dunn, PA-C . Jacquelyn Visser, PA-C     

## 2018-07-24 NOTE — Progress Notes (Signed)
Cardiology Office Note   Date:  07/24/2018   ID:  James Yates, DOB Jun 02, 1955, MRN [id number]  PCP: None Cardiologist:   Lorine Bears, Yates   Chief Complaint  Patient presents with  . Other    post tavr follow up. Patient denies chest pain and SOB. meds reviewed verbally with aptient.       History of Present Illness: James Yates is a 63 y.o. male who presents for a followup visit regarding coronary artery disease status post CABG in February of 2015 after NSTEMI and severe aortic stenosis due to bicuspid aortic valve status post TAVR in October 2019. Other medical problems include hypertension, hyperlipidemia and RBBB . The patient had progression of aortic stenosis to the severe range last year.  Cardiac catheterization in July 2019 showed significant underlying three-vessel coronary artery disease with patent LIMA to LAD, patent SVG to RCA and known chronically occluded SVG to OM 3 with occluded native left circumflex.  The patient underwent TAVR in October 2019 with complete resolution of anginal symptoms.  He is doing very well at the present time.  He ran out of metoprolol a week ago. Most recent echocardiogram in November showed normal LV systolic function with normal TAVR prosthesis with a mean gradient of 12 mmHg.  Past Medical History:  Diagnosis Date  . Coronary artery disease    s/p CABG  . Glucose intolerance (impaired glucose tolerance)   . Hyperlipidemia   . Hypertension   . Obesity   . S/P TAVR (transcatheter aortic valve replacement)    26 mm Edwards Sapien 3 transcatheter heart valve placed via percutaneous right transfemoral approach   . Severe aortic stenosis     Past Surgical History:  Procedure Laterality Date  . CARDIAC CATHETERIZATION  06/2013   [facility]  . CARDIAC CATHETERIZATION N/A 10/29/2015   Procedure: Left Heart Cath and Cors/Grafts Angiography;  Surgeon: James Yates;  Location: [facility] INVASIVE CV LAB;  Service: Cardiovascular;   Laterality: N/A;  . CORONARY ARTERY BYPASS GRAFT N/A 07/05/2013   Procedure: CORONARY ARTERY BYPASS GRAFTING (CABG);  Surgeon: James Yates;  Location: [facility] OR;  Service: Open Heart Surgery;  Laterality: N/A;  . INTRAOPERATIVE TRANSESOPHAGEAL ECHOCARDIOGRAM N/A 07/05/2013   Procedure: INTRAOPERATIVE TRANSESOPHAGEAL ECHOCARDIOGRAM;  Surgeon: James Yates;  Location: [facility] OR;  Service: Open Heart Surgery;  Laterality: N/A;  . INTRAOPERATIVE TRANSTHORACIC ECHOCARDIOGRAM N/A 03/13/2018   Procedure: INTRAOPERATIVE TRANSTHORACIC ECHOCARDIOGRAM;  Surgeon: James Yates;  Location: [facility] OR;  Service: Open Heart Surgery;  Laterality: N/A;  . NO PAST SURGERIES    . RIGHT/LEFT HEART CATH AND CORONARY ANGIOGRAPHY Bilateral 11/27/2017   Procedure: RIGHT/LEFT HEART CATH AND CORONARY ANGIOGRAPHY;  Surgeon: James Yates;  Location: [facility] INVASIVE CV LAB;  Service: Cardiovascular;  Laterality: Bilateral;  . TRANSCATHETER AORTIC VALVE REPLACEMENT, TRANSFEMORAL N/A 03/13/2018   Procedure: TRANSCATHETER AORTIC VALVE REPLACEMENT, TRANSFEMORAL. Edwards Sapien 3 Transcatheter Heart Valve size 27mm.;  Surgeon: James Yates;  Location: [facility] OR;  Service: Open Heart Surgery;  Laterality: N/A;     Current Outpatient Medications  Medication Sig Dispense Refill  . aspirin EC 81 MG tablet Take 1 tablet (81 mg total) by mouth daily. 30 tablet 3  . atorvastatin (LIPITOR) 80 MG tablet TAKE 1 TABLET (80 MG TOTAL) BY MOUTH DAILY AT 6 PM. 90 tablet 3  . clopidogrel (PLAVIX) 75 MG tablet Take 1 tablet (75 mg total) by mouth daily with breakfast. 90 tablet 1  .  metoprolol tartrate (LOPRESSOR) 50 MG tablet TAKE 1 TABLET BY MOUTH TWICE A DAY 180 tablet 1  . potassium chloride (K-DUR) 10 MEQ tablet Take 1 tablet (10 mEq total) by mouth daily. 90 tablet 2   No current facility-administered medications for this visit.     Allergies:   Patient has no known allergies.    Social History:  The  patient  reports that he has never smoked. He has never used smokeless tobacco. He reports that he does not drink alcohol or use drugs.   Family History:  The patient's family history includes Heart attack in his father; Heart disease in his father.    ROS:  Please see the history of present illness.   Otherwise, review of systems are positive for none.   All other systems are reviewed and negative.    PHYSICAL EXAM: VS:  BP 140/78 (BP Location: Left Arm, Patient Position: Sitting, Cuff Size: Normal)   Pulse 93   Ht 5\' 5"  (1.651 m)   Wt 192 lb (87.1 kg)   BMI 31.95 kg/m  , BMI Body mass index is 31.95 kg/m. GEN: Well nourished, well developed, in no acute distress  HEENT: normal  Neck: no JVD, carotid bruits, or masses Cardiac: RRR; no  rubs, or gallops,no edema . There is a 1/6 systolic aortic murmur which is a early peaking. Respiratory:  clear to auscultation bilaterally, normal work of breathing GI: soft, nontender, nondistended, + BS MS: no deformity or atrophy  Skin: warm and dry, no rash Neuro:  Strength and sensation are intact Psych: euthymic mood, full affect   EKG:  EKG is  ordered today. EKG done showed normal sinus rhythm with right bundle branch block .  Recent Labs: 03/09/2018: ALT 22; B Natriuretic Peptide 56.2 03/14/2018: Magnesium 1.7 03/15/2018: BUN 7; Creatinine, Ser 0.84; Hemoglobin 12.8; Platelets 112; Potassium 3.4; Sodium 137    Lipid Panel    Component Value Date/Time   CHOL 130 01/08/2018 1552   CHOL 159 09/21/2015 1457   CHOL 215 (H) 07/02/2013 0058   TRIG 127 01/08/2018 1552   TRIG 143 07/02/2013 0058   HDL 39 (L) 01/08/2018 1552   HDL 39 (L) 09/21/2015 1457   HDL 38 (L) 07/02/2013 0058   CHOLHDL 3.3 01/08/2018 1552   VLDL 25 01/08/2018 1552   VLDL 29 07/02/2013 0058   LDLCALC 66 01/08/2018 1552   LDLCALC 78 09/21/2015 1457   LDLCALC 148 (H) 07/02/2013 0058      Wt Readings from Last 3 Encounters:  07/24/18 192 lb (87.1 kg)    06/28/18 192 lb (87.1 kg)  05/07/18 196 lb 6.4 oz (89.1 kg)        ASSESSMENT AND PLAN:  1.  Status post TAVR for severe aortic stenosis: He is doing extremely well and attending cardiac rehab.  Continue Plavix at least until April.  Continue antibiotic prophylaxis before dental procedures.   2. Coronary artery disease involving native coronary arteries without angina: His anginal symptoms resolved completely after TAVR.  He has known occluded SVG to OM.  Continue medical therapy for now.  3. Essential hypertension: Blood pressure is mildly elevated but he has not been taking metoprolol for 1 week.  I refilled this medication.  4. Hyperlipidemia: Continue high dose atorvastatin.     Disposition:   FU with me in 4 months  Signed,  Lorine Bears, Yates  07/24/2018 4:57 PM    [facility]

## 2018-07-24 NOTE — Progress Notes (Deleted)
Cardiology Office Note   Date:  07/24/2018   ID:  James Yates, DOB [birth year], MRN [id number]  PCP: None Cardiologist:   Lorine Bears, MD   No chief complaint on file.     History of Present Illness: James Yates is a 63 y.o. male who presents for a followup visit regarding coronary artery disease status post CABG in February of 2015 after NSTEMI.  Other medical problems include hypertension, hyperlipidemia and bicuspid aortic valve .  Most recent cardiac catheterization in June 2017 showed severe underlying three-vessel coronary artery disease with patent LIMA to LAD and SVG to RCA. The SVG to the left circumflex was occluded. Native left circumflex was occluded proximally with faint collaterals. Aortic stenosis was moderate with peak to peak gradient of 22 mmHg .  I reviewed his cardiac catheterization with our CTO team and the opinion was that the left circumflex was not optimal for PCI.   He had recent worsening of exertional chest tightness.  Symptoms usually responds to 5 minutes of rest and does not require nitroglycerin.  He had an echocardiogram done in May which was reviewed with him today.  It showed an EF of 60 to 65% with progression of aortic stenosis with a mean gradient of 38 mmHg and peak gradient of 71 mmHg.  Aortic valve area was 0.6 cm.  Systolic pulmonary pressure was 46 mmHg.  He has been taking his medications regularly.  Past Medical History:  Diagnosis Date  . Coronary artery disease    s/p CABG  . Glucose intolerance (impaired glucose tolerance)   . Hyperlipidemia   . Hypertension   . Obesity   . S/P TAVR (transcatheter aortic valve replacement)    26 mm Edwards Sapien 3 transcatheter heart valve placed via percutaneous right transfemoral approach   . Severe aortic stenosis     Past Surgical History:  Procedure Laterality Date  . CARDIAC CATHETERIZATION  06/2013   [facility]  . CARDIAC CATHETERIZATION N/A 10/29/2015   Procedure: Left Heart  Cath and Cors/Grafts Angiography;  Surgeon: Iran Ouch, MD;  Location: [facility] INVASIVE [facility];  Service: Cardiovascular;  Laterality: N/A;  . CORONARY ARTERY BYPASS GRAFT N/A 07/05/2013   Procedure: CORONARY ARTERY BYPASS GRAFTING (CABG);  Surgeon: [name], MD;  Location: [facility] OR;  Service: Open Heart Surgery;  Laterality: N/A;  . INTRAOPERATIVE TRANSESOPHAGEAL ECHOCARDIOGRAM N/A 07/05/2013   Procedure: INTRAOPERATIVE TRANSESOPHAGEAL ECHOCARDIOGRAM;  Surgeon: [name], MD;  Location: [facility] OR;  Service: Open Heart Surgery;  Laterality: N/A;  . INTRAOPERATIVE TRANSTHORACIC ECHOCARDIOGRAM N/A 03/13/2018   Procedure: INTRAOPERATIVE TRANSTHORACIC ECHOCARDIOGRAM;  Surgeon: Kathleene Hazel, MD;  Location: [facility] OR;  Service: Open Heart Surgery;  Laterality: N/A;  . NO PAST SURGERIES    . RIGHT/LEFT HEART CATH AND CORONARY ANGIOGRAPHY Bilateral 11/27/2017   Procedure: RIGHT/LEFT HEART CATH AND CORONARY ANGIOGRAPHY;  Surgeon: Iran Ouch, MD;  Location: [facility] INVASIVE CV LAB;  Service: Cardiovascular;  Laterality: Bilateral;  . TRANSCATHETER AORTIC VALVE REPLACEMENT, TRANSFEMORAL N/A 03/13/2018   Procedure: TRANSCATHETER AORTIC VALVE REPLACEMENT, TRANSFEMORAL. Edwards Sapien 3 Transcatheter Heart Valve size 33mm.;  Surgeon: Kathleene Hazel, MD;  Location: [facility] OR;  Service: Open Heart Surgery;  Laterality: N/A;     Current Outpatient Medications  Medication Sig Dispense Refill  . amLODipine (NORVASC) 5 MG tablet TAKE 1 TABLET BY MOUTH EVERY DAY 90 tablet 0  . amoxicillin (AMOXIL) 500 MG tablet Take 4 tablets (2,000 mg) one hour prior to dental visits. 4 tablet  10  . aspirin EC 81 MG tablet Take 1 tablet (81 mg total) by mouth daily. 30 tablet 3  . atorvastatin (LIPITOR) 80 MG tablet TAKE 1 TABLET (80 MG TOTAL) BY MOUTH DAILY AT 6 PM. 90 tablet 3  . clopidogrel (PLAVIX) 75 MG tablet Take 1 tablet (75 mg total) by mouth daily with breakfast. 90 tablet 1  . metoprolol tartrate  (LOPRESSOR) 50 MG tablet TAKE 1 TABLET BY MOUTH TWICE A DAY 180 tablet 1  . potassium chloride (K-DUR) 10 MEQ tablet Take 1 tablet (10 mEq total) by mouth daily. 90 tablet 2   No current facility-administered medications for this visit.     Allergies:   Patient has no known allergies.    Social History:  The patient  reports that he has never smoked. He has never used smokeless tobacco. He reports that he does not drink alcohol or use drugs.   Family History:  The patient's family history includes Heart attack in his father; Heart disease in his father.    ROS:  Please see the history of present illness.   Otherwise, review of systems are positive for none.   All other systems are reviewed and negative.    PHYSICAL EXAM: VS:  There were no vitals taken for this visit. , BMI There is no height or weight on file to calculate BMI. GEN: Well nourished, well developed, in no acute distress  HEENT: normal  Neck: no JVD, carotid bruits, or masses Cardiac: RRR; no  rubs, or gallops,no edema . There is a 3/6 crescendo decrescendo aortic murmur which is mid peaking. Respiratory:  clear to auscultation bilaterally, normal work of breathing GI: soft, nontender, nondistended, + BS MS: no deformity or atrophy  Skin: warm and dry, no rash Neuro:  Strength and sensation are intact Psych: euthymic mood, full affect   EKG:  EKG is  ordered today. EKG done showed normal sinus rhythm with right bundle branch block and old inferior infarct with inferior T wave changes.   Recent Labs: 03/09/2018: ALT 22; B Natriuretic Peptide 56.2 03/14/2018: Magnesium 1.7 03/15/2018: BUN 7; Creatinine, Ser 0.84; Hemoglobin 12.8; Platelets 112; Potassium 3.4; Sodium 137    Lipid Panel    Component Value Date/Time   CHOL 130 01/08/2018 1552   CHOL 159 09/21/2015 1457   CHOL 215 (H) 07/02/2013 0058   TRIG 127 01/08/2018 1552   TRIG 143 07/02/2013 0058   HDL 39 (L) 01/08/2018 1552   HDL 39 (L) 09/21/2015  1457   HDL 38 (L) 07/02/2013 0058   CHOLHDL 3.3 01/08/2018 1552   VLDL 25 01/08/2018 1552   VLDL 29 07/02/2013 0058   LDLCALC 66 01/08/2018 1552   LDLCALC 78 09/21/2015 1457   LDLCALC 148 (H) 07/02/2013 0058      Wt Readings from Last 3 Encounters:  06/28/18 192 lb (87.1 kg)  05/07/18 196 lb 6.4 oz (89.1 kg)  04/18/18 194 lb 12.8 oz (88.4 kg)        ASSESSMENT AND PLAN:  1.  Symptomatic severe aortic stenosis with bicuspid aortic valve: The patient's symptoms have progressed as well as his severity of aortic stenosis on serial echocardiograms.  Due to that, I recommend evaluation for aortic valve replacement.  This is probably best done surgically given the patient's age, bicuspid morphology and the presence of coronary artery disease that requires likely at least one vessel CABG. I discussed this with him today with an interpreter present.  I recommend proceeding with a right  and left cardiac catheterization.  I discussed the procedure in details as well as risks and benefits.  We will plan on femoral access in order to monitor aortic valve gradient.  2. Coronary artery disease involving native coronary arteries with worsening angina:     Worsening symptoms are likely due to progression of aortic stenosis.  He also has known occluded left circumflex with occluded SVG to OM.  Recheck coronary anatomy with cath and if unchanged, the patient will likely require one-vessel redo CABG with aortic valve replacement.  3. Essential hypertension: Blood pressure is well controlled on current medications.  4. Hyperlipidemia: Continue high dose atorvastatin.     Disposition:   FU with me in 1 months  Signed,  Lorine Bears, MD  07/24/2018 11:20 AM    [facility]

## 2018-08-14 ENCOUNTER — Telehealth: Payer: Self-pay | Admitting: Cardiovascular Disease

## 2018-08-14 NOTE — Telephone Encounter (Signed)
Called local post office to verify what can be done with shipping medication.  He stated that they could not ship his medication through the post office but the pharmacy can.  He said to call the pharmacy to see what they say and how it can be done.   [facility]ed [facility] on [address].  Pharmacist stated that she was unsure about how to help with this situation.  Told me to contact local [employer] drop off and see what they say.   Called local [employer] drop off in [facility] with Toney Reil and [place] who was a lot of help with this.  She stated that shipping medication is very hard. There is paper work that [place] would have to fill out in order for patient to get his medication.  She stated that it would be much easier for the patient to see a doctor while he is in [place].   Spoke with daughter.  Relayed this information to his daughter.  She was very pleased with the effort put in to help get her father his medication.  She stated that she will reach out to him and see if he can go to a local Urgent care or doctor to get his medications refilled.  Patients daughter called all his medications out to make sure she had them all and had the correct doses.  She stated that he will be there for at least for another 15 days.

## 2018-08-14 NOTE — Telephone Encounter (Signed)
°*  STAT* If patient is at the pharmacy, call can be transferred to refill team.   1. Which medications need to be refilled? (please list name of each medication and dose if known)      ASA 81 mg po q d     Atorvastatin 80 mg po q d     Plavix 75 mg po q d     Metoprolol 50 mg po BID    Potassium 10 mEq po q d   2. Which pharmacy/location (including street and city if local pharmacy) is medication to be sent to? [facility] [address] [place]  3. Do they need a 30 day or 90 day supply? 90   FYI PATIENT IN [place] and the borders closed .  He is unable to travel home and is almost out of medication.  Please advise daughter [name] on [place] [phone] if there is a way to get him his medication .

## 2018-08-16 ENCOUNTER — Telehealth: Payer: Self-pay | Admitting: Cardiovascular Disease

## 2018-08-16 NOTE — Telephone Encounter (Signed)
Pt c/o medication issue:  1. Name of Medication: Potassium   2. How are you currently taking this medication (dosage and times per day)? 10 mEq po q d   3. Are you having a reaction (difficulty breathing--STAT)? no  4. What is your medication issue? Patient is in [place] and this is the only medication he was not able to have filled while quarantined there.  The daughter wants to know if ok to go without or are there alternatives.  Please call.

## 2018-08-16 NOTE — Telephone Encounter (Signed)
Attempted to reach daughter. Message was "call cannot be completed at this time, please try call again later."  Routing to Dr Kirke Corin for further suggestion.

## 2018-08-17 NOTE — Telephone Encounter (Signed)
He should be fine with Potassium tablet. He was only on a small dose and does not take a diuretic.  I agree with eating a banana daily or some  Potato as these are good sources of potassium.

## 2018-08-17 NOTE — Telephone Encounter (Signed)
Attempted to reach the patient's daughter. Call was unable to be completed at this time.

## 2018-08-21 NOTE — Telephone Encounter (Signed)
Attempted to reach the patient's daughter. There was no answer or voicemail available.

## 2018-08-24 NOTE — Telephone Encounter (Signed)
Unable to reach the daughter. No voicemail was available. Encounter will be closed as this is the third attempt.

## 2018-09-05 ENCOUNTER — Other Ambulatory Visit: Payer: Self-pay | Admitting: Physician Assistant

## 2018-09-09 IMAGING — CR DG LUMBAR SPINE 2-3V
1 series · 3 of 3 positions shown · non-contrast
Comparison: CT 12/14/2017

CLINICAL DATA: Low back pain

EXAM:
LUMBAR SPINE - 2-3 VIEW

[Series 1: dg lumbar spine 2-3 views · 0.14mm/px · 3 of 3 slices shown]
[im 1/3]
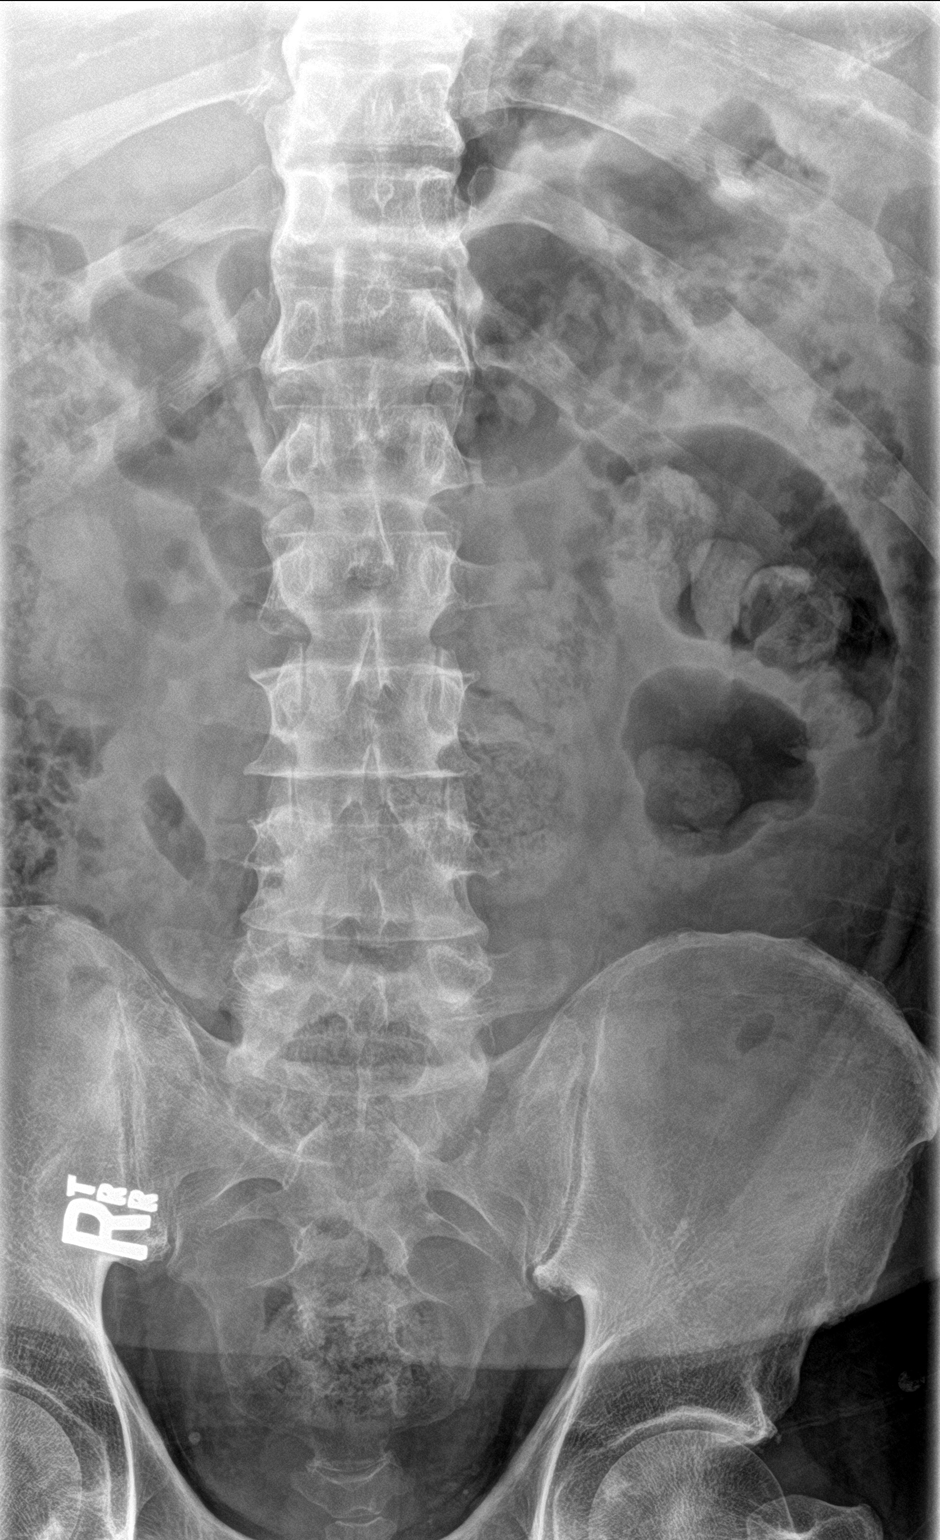
[im 2/3]
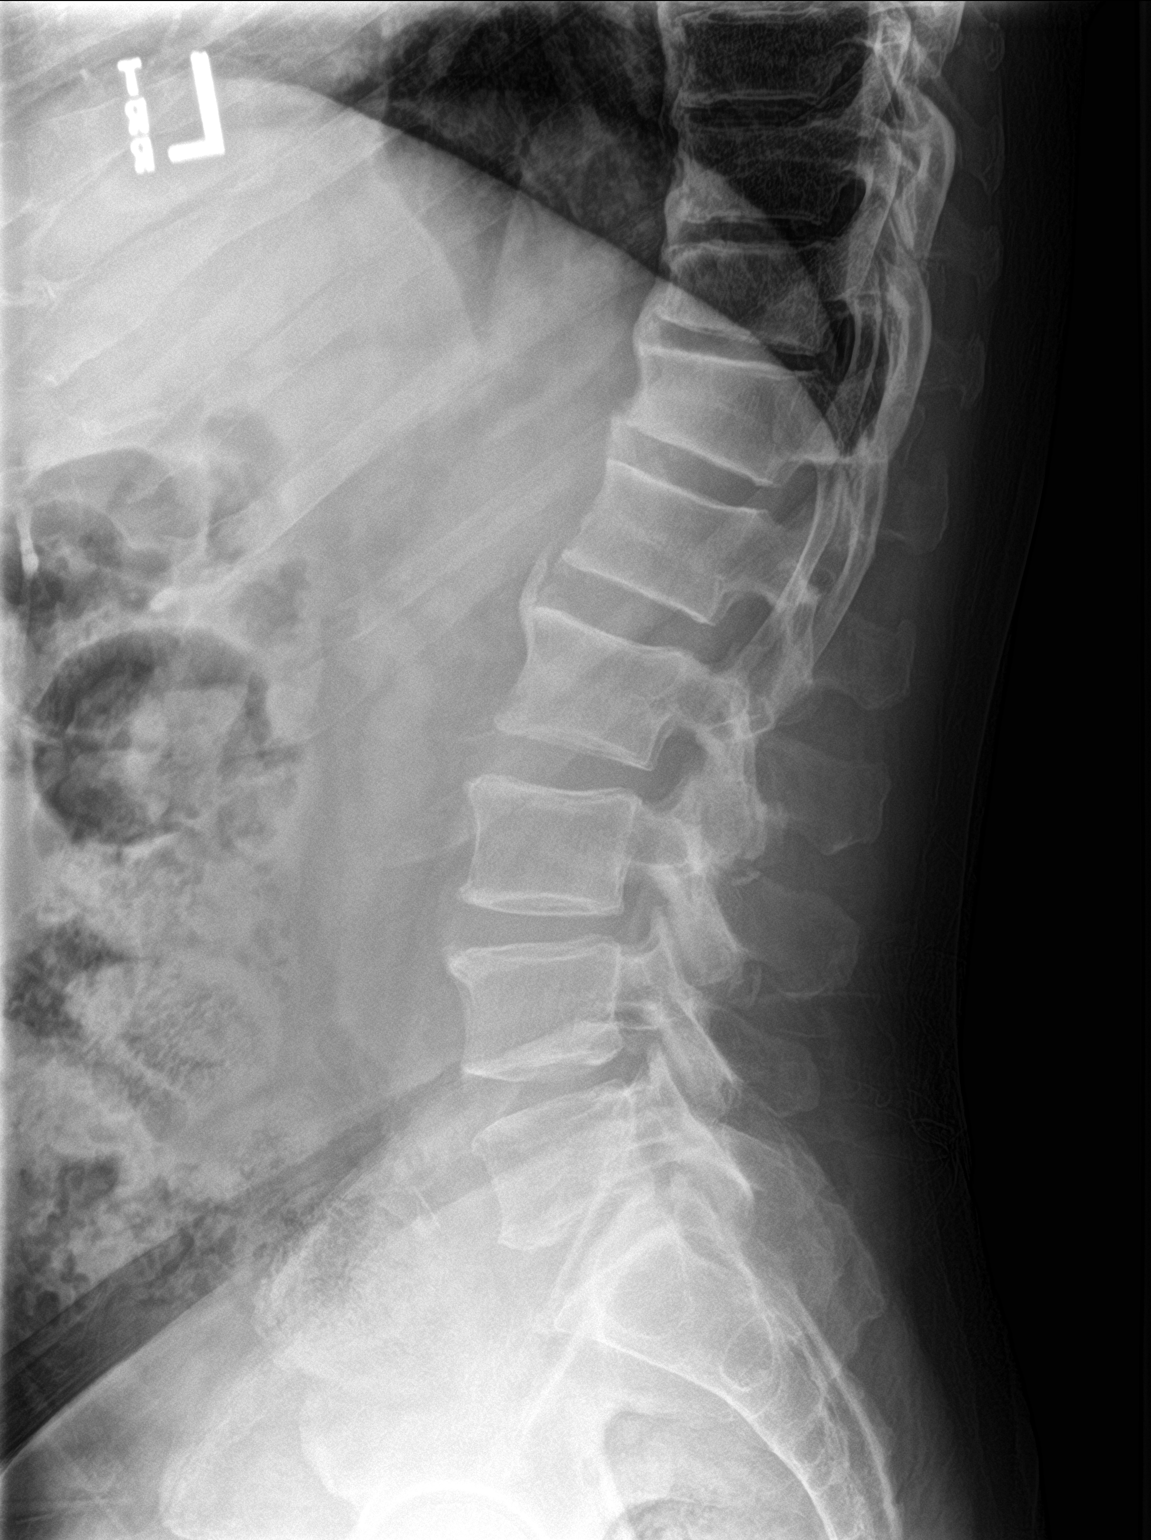
[im 3/3]
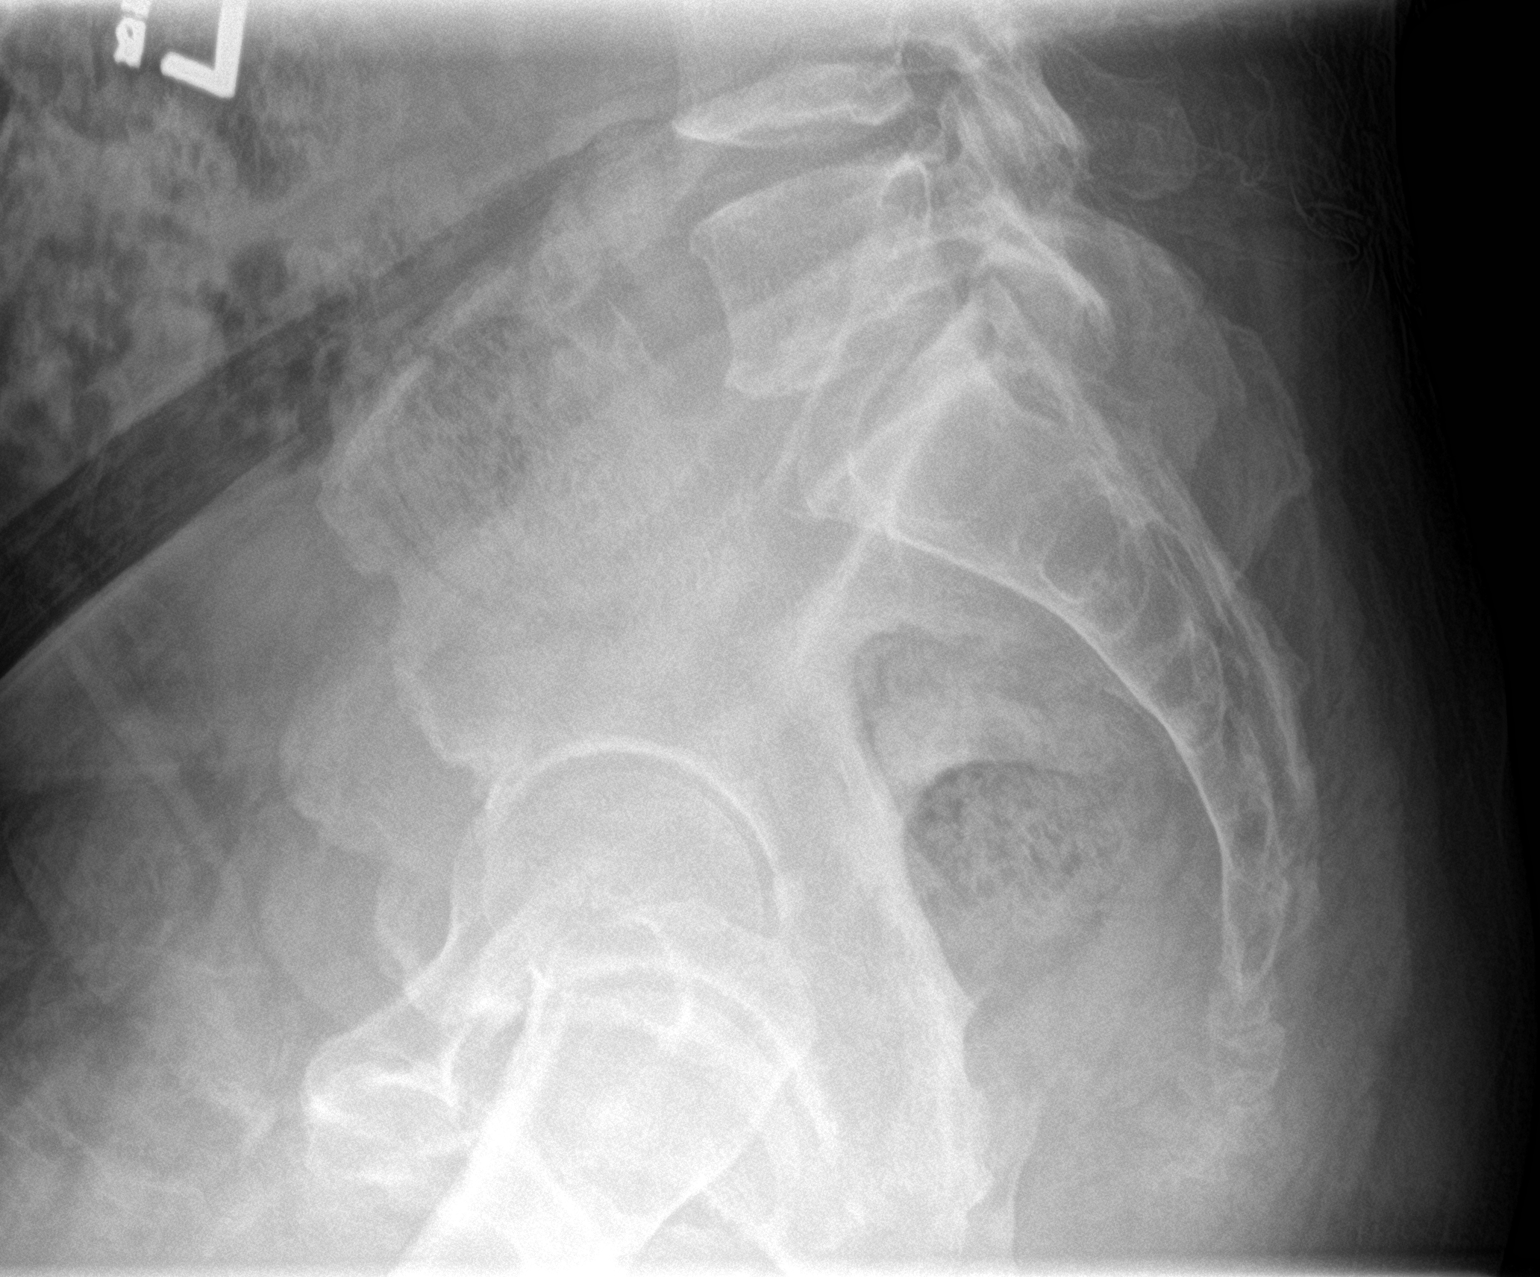

[3 of 3 positions shown; findings below may reference images not displayed]

FINDINGS: Degenerative spurring anteriorly in the lower thoracic and upper
lumbar spine. Mild wedged appearance of the T12 vertebral body is
stable since prior CT. No acute fracture or malalignment. SI joints
are symmetric and unremarkable.
IMPRESSION: No acute bony abnormality.

## 2018-10-08 ENCOUNTER — Telehealth: Payer: Self-pay | Admitting: Cardiovascular Disease

## 2018-10-08 DIAGNOSIS — Z952 Presence of prosthetic heart valve: Secondary | ICD-10-CM

## 2018-10-08 MED ORDER — AMOXICILLIN 500 MG PO CAPS: 2000.0000 mg | ORAL_CAPSULE | Freq: Once | ORAL | 2 refills | Status: AC

## 2018-10-08 NOTE — Telephone Encounter (Signed)
   Primary Cardiologist: Lorine Bears, MD  Chart reviewed as part of pre-operative protocol coverage. Simple dental extractions are considered low risk procedures per guidelines and generally do not require any specific cardiac clearance. It is also generally accepted that for simple extractions and dental cleanings, there is no need to interrupt blood thinner therapy.   SBE prophylaxis is required for the patient.  I have sent a script for 2g amoxicillin to be taken 30-60 min prior to procedure.  I will route this recommendation to the requesting party via Epic fax function and remove from pre-op pool.  Please call with questions.  Roe Rutherford Duke, PA 10/08/2018, 4:03 PM

## 2018-10-08 NOTE — Telephone Encounter (Signed)
° °  [facility] Pre-operative Risk Assessment    Request for surgical clearance:  1. What type of surgery is being performed? Extraction of molar   2. When is this surgery scheduled? TBD   3. What type of clearance is required (medical clearance vs. Pharmacy clearance to hold med vs. Both)? both  4. Are there any medications that need to be held prior to surgery and how long? Lipitor, Plavix, Lopressor, K-Dur - if need to stop blood thinner, how long prior.  Advise on what antibiotic will be ok - Amoxicillin (500 MG), Clindamycin (150 MG), or Erythromycin (500 MG) or other.    5. Practice name and name of physician performing surgery? [facility]    6. What is your office phone number [phone]    7.   What is your office fax number [phone]  8.   Anesthesia type (None, local, MAC, general) ? Advise if local anesthetic with small amount of epinephrine and permissible   Ace Gins 10/08/2018, 3:32 PM  _________________________________________________________________   (provider comments below)

## 2018-10-24 IMAGING — CR DG CHEST 2V
2 series · 2 of 2 positions shown · non-contrast
Comparison: CT of the chest on 12/14/2017

CLINICAL DATA: Preop exam; TAVR scheduled for 03/13/18 Hx of CAD,
HTN, aortic stenosis.

EXAM:
CHEST - 2 VIEW

[w chest pa]
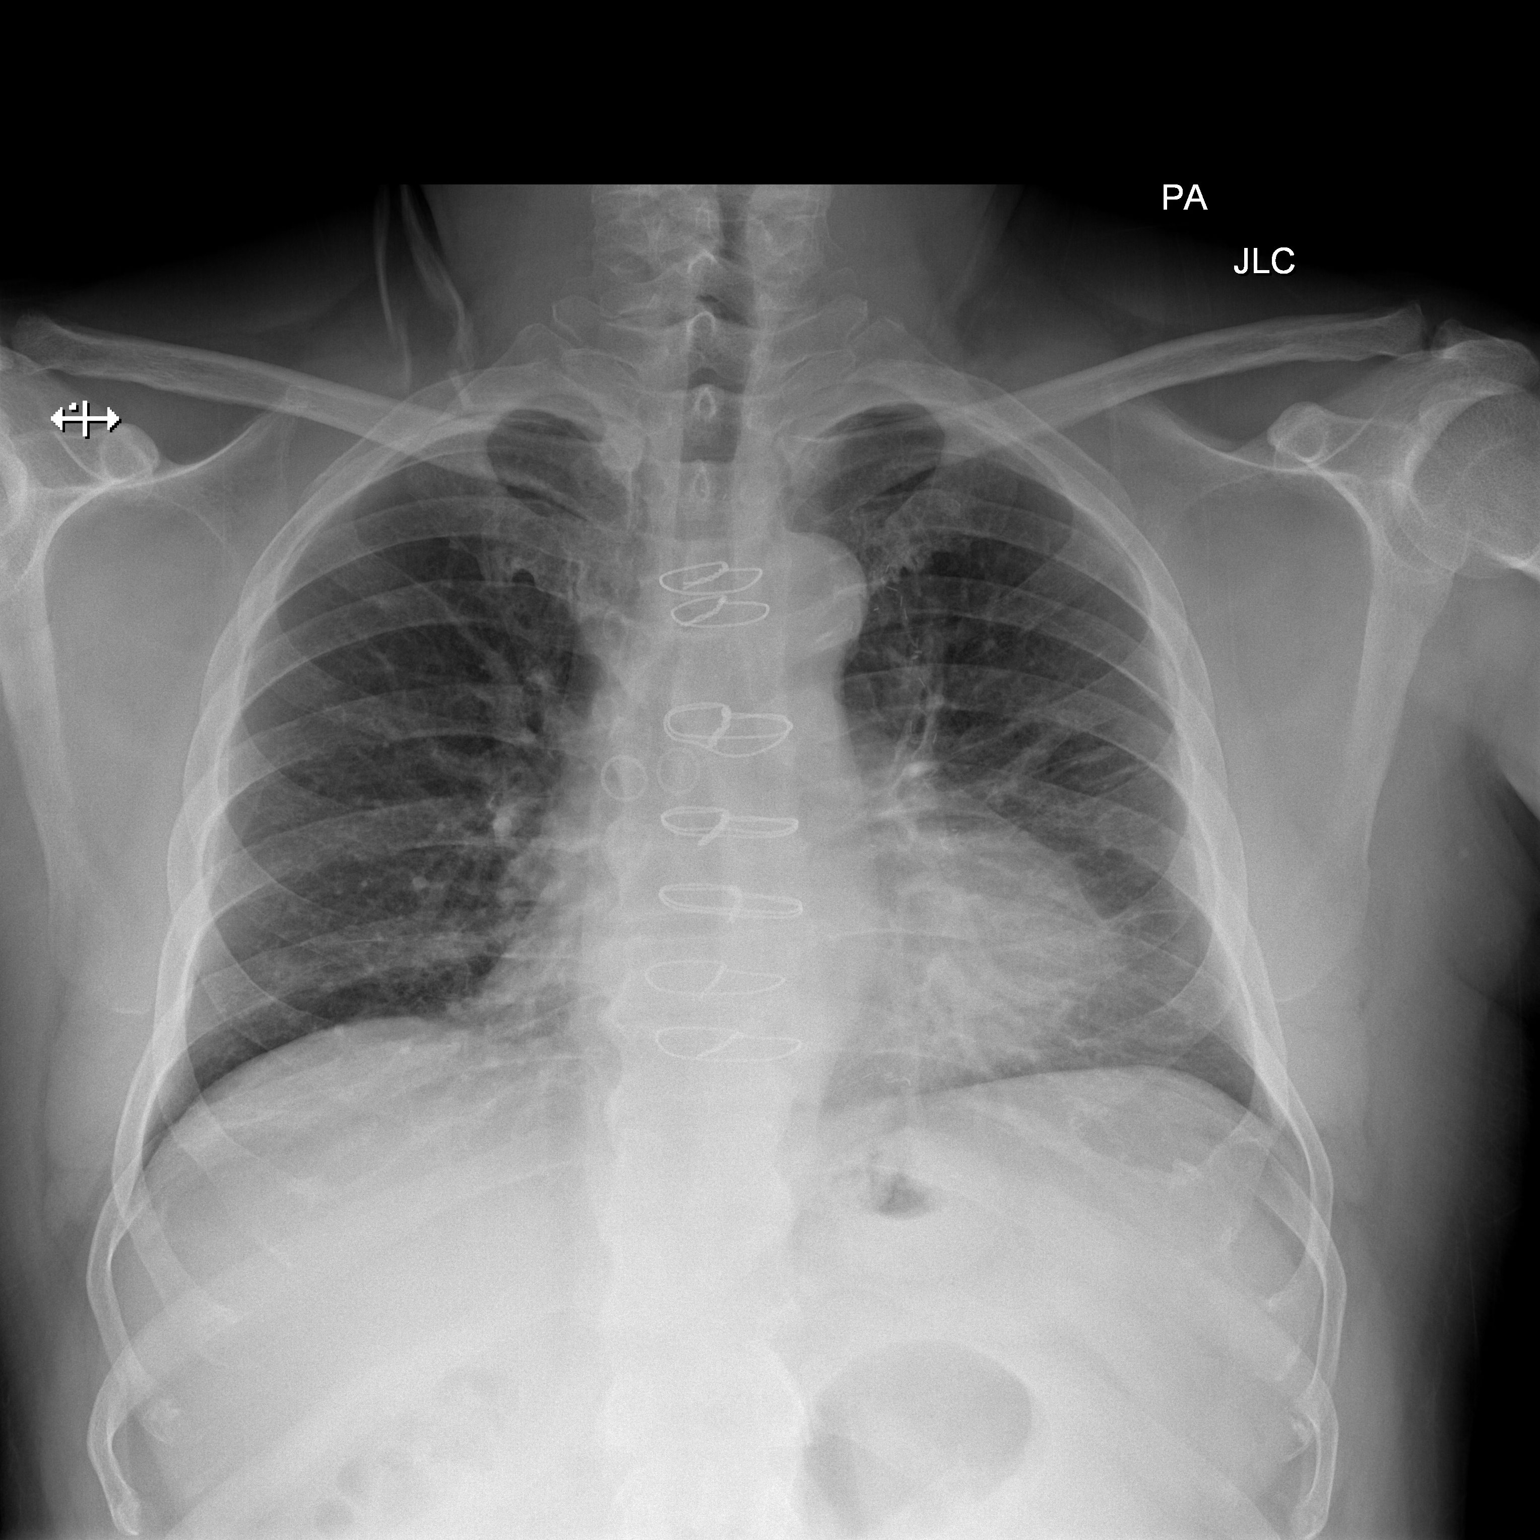

[w chest lat]
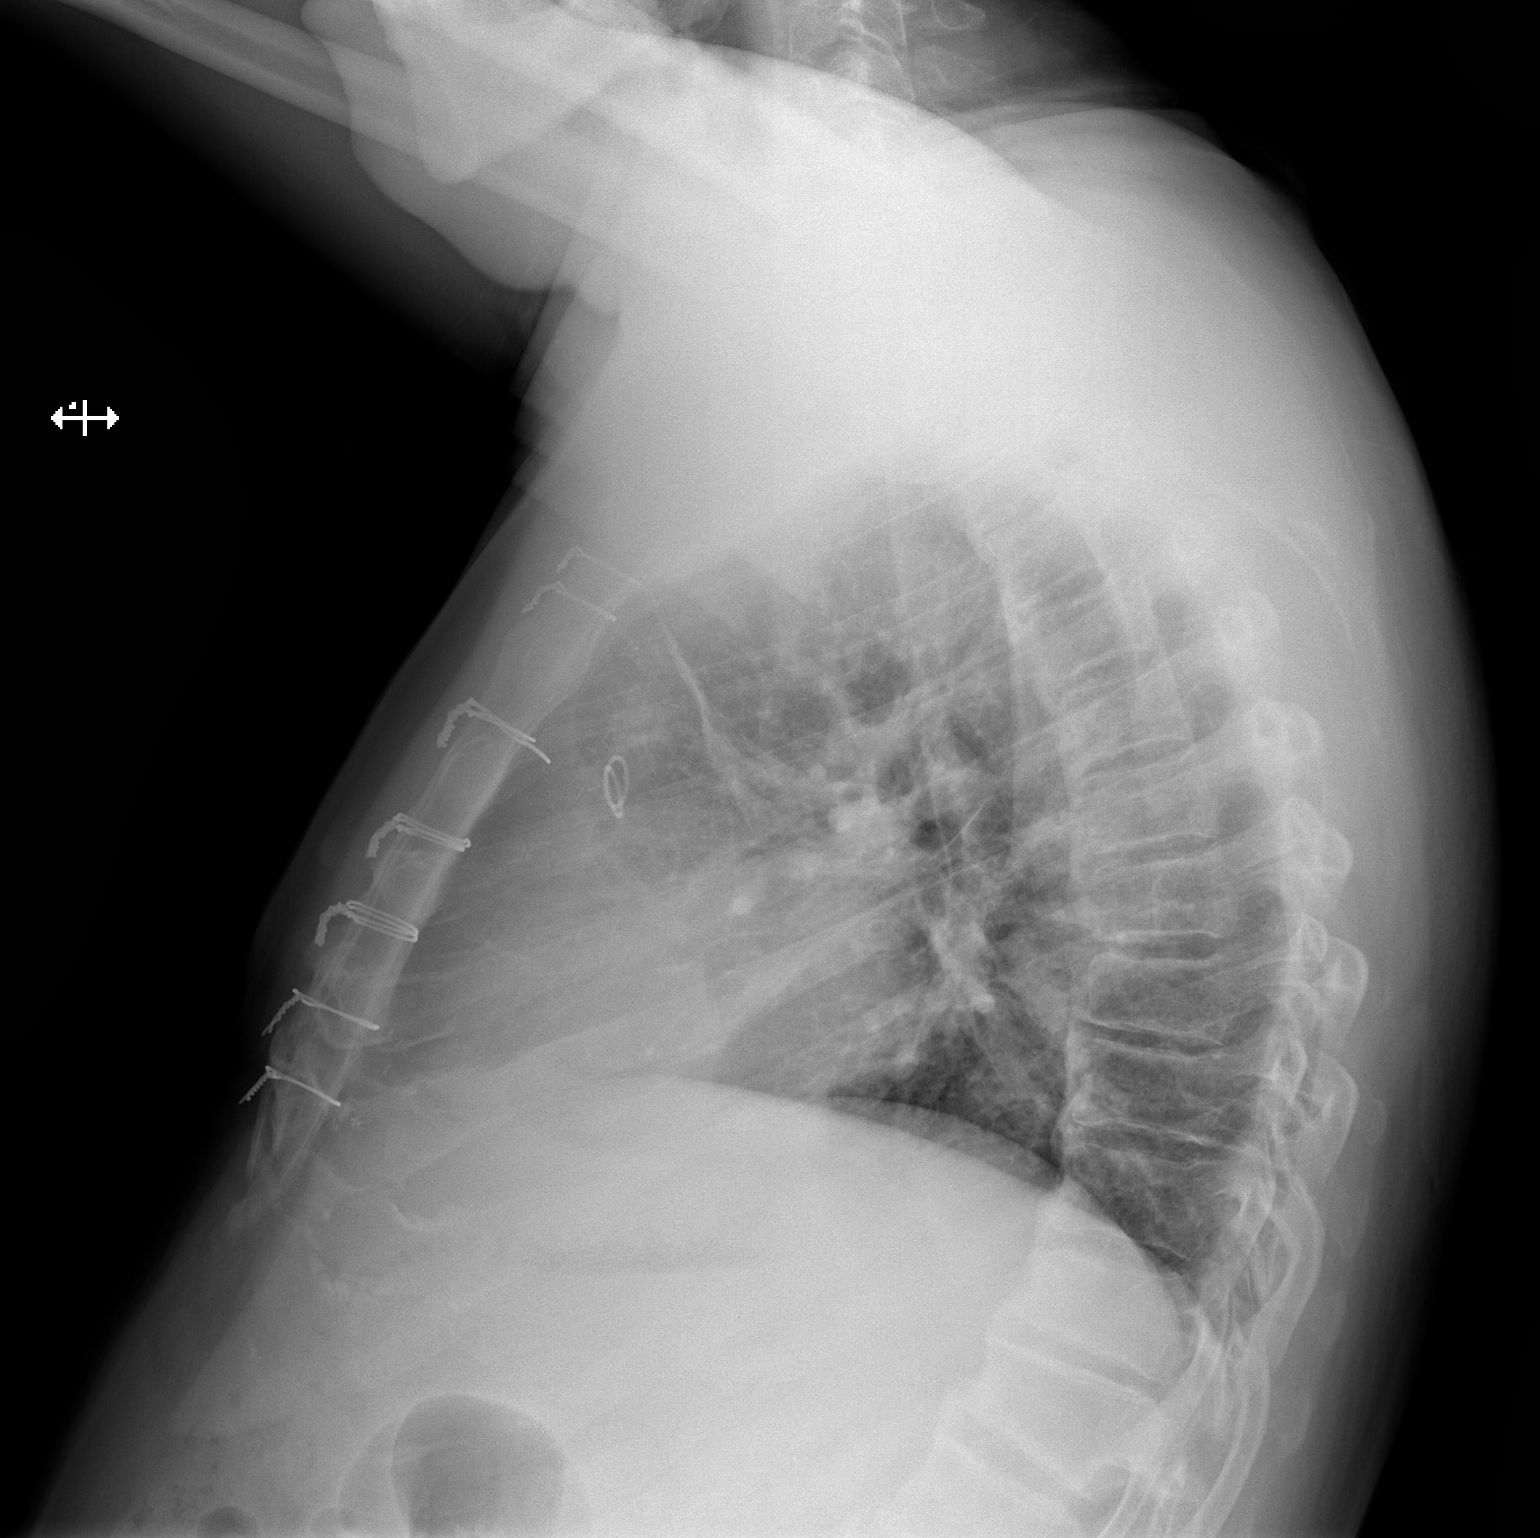

[2 of 2 positions shown; findings below may reference images not displayed]

FINDINGS: Status post median sternotomy and CABG. The heart is normal in size.
Aorta is tortuous and focally calcified. There is subsegmental
atelectasis versus scarring in the lingula.
IMPRESSION: No evidence for acute  abnormality.

## 2018-10-28 IMAGING — DX DG CHEST 1V PORT
1 series · 1 of 1 positions shown · non-contrast
Comparison: Radiographs March 09, 2018.

CLINICAL DATA: Status post transcatheter aortic valve replacement.

EXAM:
PORTABLE CHEST 1 VIEW

[chest ap]
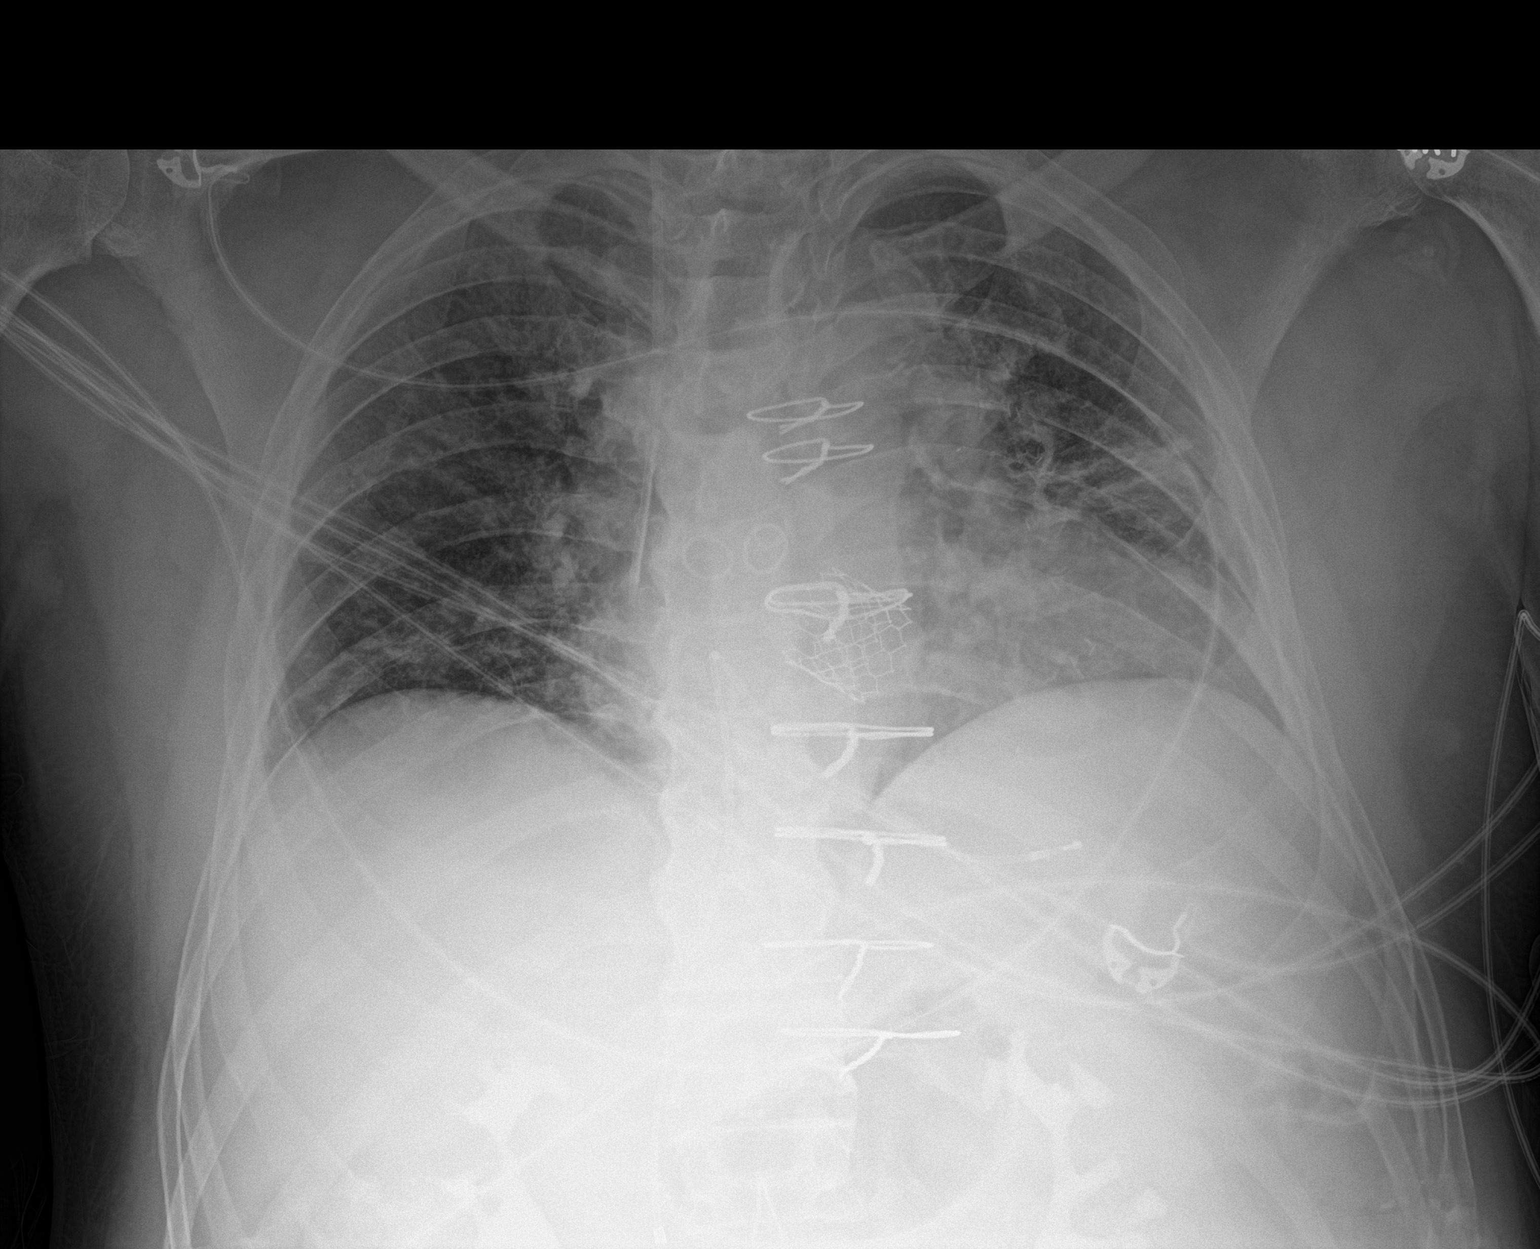

[1 of 1 positions shown; findings below may reference images not displayed]

FINDINGS: Stable cardiomediastinal silhouette. Status post coronary artery
bypass graft. Interval placement of right internal jugular catheter
with distal tip in expected position of cavoatrial junction. No
pneumothorax or significant pleural effusion is noted. Hypoinflation
of the lungs is noted with mild bibasilar subsegmental atelectasis.
Bony thorax is unremarkable.
IMPRESSION: Hypoinflation of the lungs with mild bibasilar subsegmental
atelectasis.

## 2018-11-15 ENCOUNTER — Telehealth: Payer: Self-pay

## 2018-11-15 NOTE — Telephone Encounter (Signed)
Called patient using [facility] [phone]. Patient is due for 4 month follow up appointment.  Amlodipine is not listed on his list at the last office visit on 07/24/18. Patient says he was taking it but ran out. Advised it would be good for patient to have follow up and bring all his medications and an updated list with him to the appointment. That way we can clarify the amlodipine.  Patient verbalized understanding of the following.   Our records indicate that you are scheduled for an in office appointment with (provider) on (date) at (time). It is very important that you arrive 45 minutes early for this appointment to ensure you are seen at your appointment time.   . You will need to enter through the [place] entrance.  . You will need to wear a mask, if you do not have one we will provide you with one when you arrive.  . The [facility] does offer valet parking that you can take advantage of if you wish to do so, if not it is a little bit of a walk from the parking lot to the front door. . Once you come into the [facility] they will ask you COVID SYMPTOM QUESTIONS, then walk you down to our office.  . [facility] HAS A NO VISITORS POLICY (unless we have already spoken to you and approved ONE visitor for you.)   Once you arrive in our office we will take your temperature (and approved visitor) if applicable. Please come with your medication or an updated list of medications that you take.   If you have any questions or concerns please call the office at [phone]  Thanks,  HeartCare Team            COVID-19 Pre-Screening Questions:  . In the past 7 to 10 days have you had a cough,  shortness of breath, headache, congestion, fever (100 or greater) body aches, chills, sore throat, or sudden loss of taste or sense of smell?  NO . Have you been around anyone with known Covid 19?  NO . Have you been around anyone who is awaiting Covid 19 test results in the past 7 to  10 days?  NO . Have you been around anyone who has been exposed to Covid 19, or has mentioned symptoms of Covid 19 within the past 7 to 10 days?  NO  If you have any concerns/questions about symptoms patients report during screening (either on the phone or at [facility]). Contact the provider seeing the patient or DOD for further guidance.  If neither are available contact a member of the leadership team.

## 2018-11-15 NOTE — Telephone Encounter (Signed)
Patient request a refill on Amlodipine 5 mg. The medication is not currently on his medication list. Please advise if the patient can start back the Amlodipine 5 mg one tablet daily.

## 2018-11-15 NOTE — Progress Notes (Deleted)
Cardiology Office Note Date:  11/15/2018  Patient ID:  James Yates, DOB March 20, 1956, MRN [id number] PCP:  Leone Haven, MD  Cardiologist:  Dr. Fletcher Anon, MD  ***refresh   Chief Complaint: Follow up  History of Present Illness: James Yates is a 63 y.o. male with history of CAD status post three-vessel CABG with LIMA to LAD, SVG to OM3, and SVG to RCA in 06/2013 in the setting of a non-STEMI, severe aortic stenosis due to bicuspid aortic valve status post TAVR in 02/2018, RBBB, hypertension, hyperlipidemia, and obesity who presents for ***.  Patient was seen in mid to late 2019 with worsening exertional chest tightness.  Echo in 09/2017 showed an EF of 60 to 65%, normal wall motion, progression of his aortic valve stenosis to severe with a peak velocity of 422 cm/s, mean gradient 38 mmHg, peak gradient 71 mmHg, left atrium was normal in size, normal RVSF, PASP 46-minute mercury.  He underwent right and left cardiac cath on 11/27/2017 that showed significant underlying 3-vessel CAD with a patent LIMA to LAD (could not be selectively engaged), patent SVG to RCA and known occluded SVG to OM3 along with occluded LCx. There was moderate to severe aortic stenosis with a mean gradient of 26 mmHg and a valve area of 1.2. However, his aortic stenosis was felt to be likely severe based on echo findings. RHC showed mildly elevated filling pressures with mild pulmonary hypertension.  Following this, he underwent TAVR in 02/2018 with complete resolution of anginal symptoms.  He was last seen in the office in 06/2018 and was doing well.  Echo from 03/2018 showed normal LV systolic function with normal TAVR prosthesis with a mean gradient of 12 mmHg.  During the COVID-19 pandemic he traveled to [place] and had some difficulty in obtaining some medications. He called on 11/15/2018 asking for a request of amlodipine, which was not on his most recent medication list.   ***  Labs: 02/2018 - potassium 3.4, serum  creatinine 0.84, AST/ALT normal, albumin 4.3, Hgb 12.8, PLT 112, magnesium 1.7, A1c 6.3 12/2017 - LDL 66   Past Medical History:  Diagnosis Date  . Coronary artery disease    s/p CABG  . Glucose intolerance (impaired glucose tolerance)   . Hyperlipidemia   . Hypertension   . Obesity   . S/P TAVR (transcatheter aortic valve replacement)    26 mm Edwards Sapien 3 transcatheter heart valve placed via percutaneous right transfemoral approach   . Severe aortic stenosis     Past Surgical History:  Procedure Laterality Date  . CARDIAC CAT[facility]IZATION  06/[facility]  . CARDIAC CATHETERIZATION N/A 10/29/2015   Procedure: Left Heart Cath and Cors/Grafts Angiography;  Surgeon: Wellington Hampshire, MD;  Location: [facility];  Service: Cardiovascular;  Laterality: N/A;  . CORONARY ARTERY BYPASS GRAFT N/A 07/05/2013   Procedure: CORONARY ARTERY BYPASS GRAFTING (CABG);  Surgeon: Ivin Poot, MD;  Location: [place];  Service: Open Heart Surgery;  Laterality: N/A;  . INTRAOPERATIVE TRANSESOPHAGEAL ECHOCARDIOGRAM N/A 07/05/2013   Procedure: INTRAOPERATIVE TRANSESOPHAGEAL ECHOCARDIOGRAM;  Surgeon: Ivin Poot, MD;  Location: [place];  Service: Open Heart Surgery;  Laterality: N/A;  . INTRAOPERATIVE TRANSTHORACIC ECHOCARDIOGRAM N/A 03/13/2018   Procedure: INTRAOPERATIVE TRANSTHORACIC ECHOCARDIOGRAM;  Surgeon: Burnell Blanks, MD;  Location: [place];  Service: Open Heart Surgery;  Laterality: N/A;  . NO PAST SURGERIES    . RIGHT/LEFT HEART CATH AND CORONARY ANGIOGRAPHY Bilateral 11/27/2017   Procedure: RIGHT/LEFT HEART CATH AND  CORONARY ANGIOGRAPHY;  Surgeon: Iran OuchArida, Muhammad A, MD;  Location: [facility];  Service: Cardiovascular;  Laterality: Bilateral;  . TRANSCATHETER AORTIC VALVE REPLACEMENT, TRANSFEMORAL N/A 03/13/2018   Procedure: TRANSCATHETER AORTIC VALVE REPLACEMENT, TRANSFEMORAL. Edwards Sapien 3 Transcatheter Heart Valve size 26mm.;  Surgeon: Kathleene HazelMcAlhany, Christopher D, MD;   Location: [facility];  Service: Open Heart Surgery;  Laterality: N/A;    No outpatient medications have been marked as taking for the 11/16/18 encounter (Appointment) with Sondra Bargesunn, Makani Seckman M, PA-C.    Allergies:   Patient has no known allergies.   Social History:  The patient  reports that he has never smoked. He has never used smokeless tobacco. He reports that he does not drink alcohol or use drugs.   Family History:  The patient's family history includes Heart attack in his father; Heart disease in his father.  ROS:   ROS   PHYSICAL EXAM: *** VS:  There were no vitals taken for this visit. BMI: There is no height or weight on file to calculate BMI.  Physical Exam   EKG:  Was ordered and interpreted by me today. Shows ***  Recent Labs: 03/09/2018: ALT 22; B Natriuretic Peptide 56.2 03/14/2018: Magnesium 1.7 03/15/2018: BUN 7; Creatinine, Ser 0.84; Hemoglobin 12.8; Platelets 112; Potassium 3.4; Sodium 137  01/08/2018: Cholesterol 130; HDL 39; LDL Cholesterol 66; Total CHOL/HDL Ratio 3.3; Triglycerides 127; VLDL 25   CrCl cannot be calculated (Patient's most recent lab result is older than the maximum 21 days allowed.).   Wt Readings from Last 3 Encounters:  07/24/18 192 lb (87.1 kg)  06/28/18 192 lb (87.1 kg)  05/07/18 196 lb 6.4 oz (89.1 kg)     Other studies reviewed: Additional studies/records reviewed today include: summarized above  ASSESSMENT AND PLAN:  1. ***  Disposition: F/u with Dr. Kirke CorinArida or an APP in ***.  Current medicines are reviewed at length with the patient today.  The patient did not have any concerns regarding medicines.  Signed, Eula Listenyan Katelinn Justice, PA-C 11/15/2018 4:39 PM     [facility] [address] [place] [address] [phone]

## 2018-11-16 ENCOUNTER — Telehealth: Payer: Self-pay | Admitting: Cardiovascular Disease

## 2018-11-16 ENCOUNTER — Ambulatory Visit: Payer: BLUE CROSS/BLUE SHIELD | Admitting: Physician Assistant

## 2018-11-16 NOTE — Telephone Encounter (Signed)
Patient declined to keep appt because insurance bcbs home is oon and may be self pay  Please call to discuss med issues ( per conversation with Clementeen Hoof )   Patient will have pcp refer to new cards so deleting recall

## 2018-11-19 ENCOUNTER — Telehealth: Payer: Self-pay | Admitting: *Deleted

## 2018-11-19 NOTE — Telephone Encounter (Signed)
Received [facility]ill request via fax [facility]

## 2018-11-20 NOTE — Telephone Encounter (Signed)
Can you please review refill for amlodipine.  Looks like it was discontinued 07/24/2018 and it looks like it was never added back.

## 2018-11-20 NOTE — Telephone Encounter (Signed)
Please see 11/16/18 telephone encounter for documentation.

## 2018-11-20 NOTE — Telephone Encounter (Signed)
Spoke with the pt. Amlodipine is not currently listed on his medlist and he has not taken the medication for several months. Pt previously declined to schedule his 4 month recall appt with Dr. Fletcher Anon, he is currently insured with [employer] and we are now currently out of network. Pt plans to f/u with his pcp and have his pcp recommend another cardiologist. Adv the pt to defer rqst for refill and resuming Amlodipine to his pcp since he plans to f/u with him soon. Pt verbalized understanding and voiced appreciation for the call.

## 2019-02-01 ENCOUNTER — Other Ambulatory Visit: Payer: Self-pay | Admitting: Physician Assistant

## 2019-02-01 DIAGNOSIS — Z952 Presence of prosthetic heart valve: Secondary | ICD-10-CM

## 2019-02-10 ENCOUNTER — Other Ambulatory Visit: Payer: Self-pay | Admitting: Cardiovascular Disease

## 2019-03-07 ENCOUNTER — Ambulatory Visit (INDEPENDENT_AMBULATORY_CARE_PROVIDER_SITE_OTHER): Payer: BLUE CROSS/BLUE SHIELD | Admitting: Cardiovascular Disease

## 2019-03-07 ENCOUNTER — Ambulatory Visit (INDEPENDENT_AMBULATORY_CARE_PROVIDER_SITE_OTHER): Payer: BLUE CROSS/BLUE SHIELD

## 2019-03-07 ENCOUNTER — Other Ambulatory Visit: Payer: Self-pay

## 2019-03-07 ENCOUNTER — Encounter: Payer: Self-pay | Admitting: Cardiovascular Disease

## 2019-03-07 VITALS — BP 144/88 | HR 77 | Temp 97.1°F | Ht 65.0 in | Wt 185.5 lb

## 2019-03-07 DIAGNOSIS — E785 Hyperlipidemia, unspecified: Secondary | ICD-10-CM

## 2019-03-07 DIAGNOSIS — I25719 Atherosclerosis of autologous vein coronary artery bypass graft(s) with unspecified angina pectoris: Secondary | ICD-10-CM

## 2019-03-07 DIAGNOSIS — Z951 Presence of aortocoronary bypass graft: Secondary | ICD-10-CM | POA: Diagnosis not present

## 2019-03-07 DIAGNOSIS — Z952 Presence of prosthetic heart valve: Secondary | ICD-10-CM

## 2019-03-07 MED ORDER — PERFLUTREN LIPID MICROSPHERE
1.0000 mL | INTRAVENOUS | Status: AC | PRN
  Administered 2019-03-07: 2 mL via INTRAVENOUS

## 2019-03-07 MED ORDER — CLOPIDOGREL BISULFATE 75 MG PO TABS: 75.0000 mg | ORAL_TABLET | Freq: Every day | ORAL | 2 refills | Status: DC

## 2019-03-07 MED ORDER — AMLODIPINE BESYLATE 5 MG PO TABS: 5.0000 mg | ORAL_TABLET | Freq: Every day | ORAL | 3 refills | Status: DC

## 2019-03-07 NOTE — Progress Notes (Addendum)
Cardiology Office Note   Date:  03/07/2019   ID:  James Yates, DOB [birth year], MRN [id number]  PCP: None Cardiologist:   Lorine Bears, MD   Chief Complaint  Patient presents with  . other    4 month follow up & just had an Echo today. Meds reviewed by the pt. verbally. "doing well."       History of Present Illness: James Yates is a 63 y.o. male who presents for a followup visit regarding coronary artery disease status post CABG in February of 2015 after NSTEMI and severe aortic stenosis due to bicuspid aortic valve status post TAVR in October 2019. Other medical problems include hypertension, hyperlipidemia and RBBB . Cardiac catheterization in July 2019 showed significant underlying three-vessel coronary artery disease with patent LIMA to LAD, patent SVG to RCA and known chronically occluded SVG to OM 3 with occluded native left circumflex. The patient underwent TAVR in October 2019 with coplete resolution of anginal symptoms.    He is doing very well.  He reports rare episodes of substernal chest tightness with overexertion.  No significant dyspnea or palpitations.  He takes his medications regularly.  Past Medical History:  Diagnosis Date  . Coronary artery disease    s/p CABG  . Glucose intolerance (impaired glucose tolerance)   . Hyperlipidemia   . Hypertension   . Obesity   . S/P TAVR (transcatheter aortic valve replacement)    26 mm Edwards Sapien 3 transcatheter heart valve placed via percutaneous right transfemoral approach   . Severe aortic stenosis     Past Surgical History:  Procedure Laterality Date  . CARDIAC CATHETERIZATION  06/2013   [facility]  . CARDIAC CATHETERIZATION N/A 10/29/2015   Procedure: Left Heart Cath and Cors/Grafts Angiography;  Surgeon: Iran Ouch, MD;  Location: [facility] INVASIVE CV LAB;  Service: Cardiovascular;  Laterality: N/A;  . CORONARY ARTERY BYPASS GRAFT N/A 07/05/2013   Procedure: CORONARY ARTERY BYPASS GRAFTING (CABG);   Surgeon: [name], MD;  Location: [facility] OR;  Service: Open Heart Surgery;  Laterality: N/A;  . INTRAOPERATIVE TRANSESOPHAGEAL ECHOCARDIOGRAM N/A 07/05/2013   Procedure: INTRAOPERATIVE TRANSESOPHAGEAL ECHOCARDIOGRAM;  Surgeon: [name], MD;  Location: [facility] OR;  Service: Open Heart Surgery;  Laterality: N/A;  . INTRAOPERATIVE TRANSTHORACIC ECHOCARDIOGRAM N/A 03/13/2018   Procedure: INTRAOPERATIVE TRANSTHORACIC ECHOCARDIOGRAM;  Surgeon: Kathleene Hazel, MD;  Location: [facility] OR;  Service: Open Heart Surgery;  Laterality: N/A;  . NO PAST SURGERIES    . RIGHT/LEFT HEART CATH AND CORONARY ANGIOGRAPHY Bilateral 11/27/2017   Procedure: RIGHT/LEFT HEART CATH AND CORONARY ANGIOGRAPHY;  Surgeon: Iran Ouch, MD;  Location: [facility] INVASIVE CV LAB;  Service: Cardiovascular;  Laterality: Bilateral;  . TRANSCATHETER AORTIC VALVE REPLACEMENT, TRANSFEMORAL N/A 03/13/2018   Procedure: TRANSCATHETER AORTIC VALVE REPLACEMENT, TRANSFEMORAL. Edwards Sapien 3 Transcatheter Heart Valve size 47mm.;  Surgeon: Kathleene Hazel, MD;  Location: [facility] OR;  Service: Open Heart Surgery;  Laterality: N/A;     Current Outpatient Medications  Medication Sig Dispense Refill  . amoxicillin (AMOXIL) 500 MG capsule TAKE 4 CAPSULES BY MOUTH ONCE FOR 1 DOSE. TO BE TAKEN 30 60 MIN PRIOR TO DENTAL PROCEDURE.    Marland Kitchen aspirin EC 81 MG tablet Take 1 tablet (81 mg total) by mouth daily. 30 tablet 3  . atorvastatin (LIPITOR) 80 MG tablet TAKE 1 TABLET (80 MG TOTAL) BY MOUTH DAILY AT 6 PM. 90 tablet 3  . clopidogrel (PLAVIX) 75 MG tablet TAKE 1 TABLET (75 MG TOTAL) BY MOUTH  DAILY WITH BREAKFAST. 90 tablet 3  . metoprolol tartrate (LOPRESSOR) 50 MG tablet Take 1 tablet (50 mg total) by mouth 2 (two) times daily. 180 tablet 1   Current Facility-Administered Medications  Medication Dose Route Frequency Provider Last Rate Last Dose  . perflutren lipid microspheres (DEFINITY) IV suspension  1-10 mL Intravenous PRN Eileen Stanford, PA-C   2 mL at 03/07/19 2542    Allergies:   Patient has no known allergies.    Social History:  The patient  reports that he has never smoked. He has never used smokeless tobacco. He reports that he does not drink alcohol or use drugs.   Family History:  The patient's family history includes Heart attack in his father; Heart disease in his father.    ROS:  Please see the history of present illness.   Otherwise, review of systems are positive for none.   All other systems are reviewed and negative.    PHYSICAL EXAM: VS:  BP (!) 144/88 (BP Location: Left Arm, Patient Position: Sitting, Cuff Size: Normal)   Pulse 77   Temp (!) 97.1 F (36.2 C)   Ht 5\' 5"  (1.651 m)   Wt 185 lb 8 oz (84.1 kg)   BMI 30.87 kg/m  , BMI Body mass index is 30.87 kg/m. GEN: Well nourished, well developed, in no acute distress  HEENT: normal  Neck: no JVD, carotid bruits, or masses Cardiac: RRR; no  rubs, or gallops,no edema . There is a 1/6 systolic aortic murmur which is a early peaking. Respiratory:  clear to auscultation bilaterally, normal work of breathing GI: soft, nontender, nondistended, + BS MS: no deformity or atrophy  Skin: warm and dry, no rash Neuro:  Strength and sensation are intact Psych: euthymic mood, full affect   EKG:  EKG is  ordered today. EKG done showed normal sinus rhythm with right bundle branch block .  Possible old inferior infarct  Recent Labs: 03/09/2018: ALT 22; B Natriuretic Peptide 56.2 03/14/2018: Magnesium 1.7 03/15/2018: BUN 7; Creatinine, Ser 0.84; Hemoglobin 12.8; Platelets 112; Potassium 3.4; Sodium 137    Lipid Panel    Component Value Date/Time   CHOL 130 01/08/2018 1552   CHOL 159 09/21/2015 1457   CHOL 215 (H) 07/02/2013 0058   TRIG 127 01/08/2018 1552   TRIG 143 07/02/2013 0058   HDL 39 (L) 01/08/2018 1552   HDL 39 (L) 09/21/2015 1457   HDL 38 (L) 07/02/2013 0058   CHOLHDL 3.3 01/08/2018 1552   VLDL 25 01/08/2018 1552   VLDL 29 07/02/2013  0058   LDLCALC 66 01/08/2018 1552   LDLCALC 78 09/21/2015 1457   LDLCALC 148 (H) 07/02/2013 0058      Wt Readings from Last 3 Encounters:  03/07/19 185 lb 8 oz (84.1 kg)  07/24/18 192 lb (87.1 kg)  06/28/18 192 lb (87.1 kg)        ASSESSMENT AND PLAN:  1.  Status post TAVR for severe aortic stenosis: He is doing extremely well and attending cardiac rehab.  Plavix is optional at this time but given his coronary artery disease, I am going to extend his dual antiplatelet therapy as tolerated.  Continue with antibiotic prophylaxis before dental procedures. I reviewed his echocardiogram which was done today and showed normal LV systolic function and normal functioning TAVR prosthesis with a mean gradient of 10 mmHg and valve area of 1.9 cm. He has NYHA class I symptoms.   2. Coronary artery disease involving native coronary arteries  with mild angina: He reports occasional chest tightness with overexertion and thus I elected to add amlodipine especially that his blood pressure is not controlled. He has known occluded SVG to OM.    3. Essential hypertension: Blood pressure continues to be mildly elevated.  I added amlodipine 5 mg daily.  Check routine labs including basic metabolic profile.  4. Hyperlipidemia: Continue high dose atorvastatin.  I requested lipid and liver profile.   Disposition:   FU with me in 6 months  Signed,  Lorine BearsMuhammad , MD  03/07/2019 10:17 AM    [facility]   Note addended with NYHA class and [facility] on 03/08/19.  Cline CrockKathryn Thompson PA-C  [facility]

## 2019-03-07 NOTE — Patient Instructions (Addendum)
Medication Instructions:  Your physician has recommended you make the following change in your medication:   START Amlodipine 5mg  daily. An Rx has been sent to your pharmacy  Plavix has been refilled today. If you need a refill on your cardiac medications before your next appointment, please call your pharmacy.   Lab work: Art gallery manager, Psychologist, occupational, Lipid, Lft today If you have labs (blood work) drawn today and your tests are completely normal, you will receive your results only by: Marland Kitchen MyChart Message (if you have MyChart) OR . A paper copy in the mail If you have any lab test that is abnormal or we need to change your treatment, we will call you to review the results.  Testing/Procedures: None ordered  Follow-Up: At [facility], you and your health needs are our priority.  As part of our continuing mission to provide you with exceptional heart care, we have created designated Provider Care Teams.  These Care Teams include your primary Cardiologist (physician) and Advanced Practice Providers (APPs -  Physician Assistants and Nurse Practitioners) who all work together to provide you with the care you need, when you need it. You will need a follow up appointment in 6 months.  Please call our office 2 months in advance to schedule this appointment.  You may see [name], MD or one of the following Advanced Practice Providers on your designated Care Team:   Murray Hodgkins, NP Christell Faith, PA-C . Marrianne Mood, PA-C  Any Other Special Instructions Will Be Listed Below (If Applicable). Please contact Dr. Ellen Henri office to schedule an appointment. Please call [phone].

## 2019-03-08 LAB — CBC WITH DIFFERENTIAL/PLATELET
Basophils Absolute: 0 10*3/uL (ref 0.0–0.2)
Basos: 0 %
EOS (ABSOLUTE): 0.1 10*3/uL (ref 0.0–0.4)
Eos: 1 %
Hematocrit: 50.5 % (ref 37.5–51.0)
Hemoglobin: 16.8 g/dL (ref 13.0–17.7)
Immature Grans (Abs): 0 10*3/uL (ref 0.0–0.1)
Immature Granulocytes: 0 %
Lymphocytes Absolute: 0.9 10*3/uL (ref 0.7–3.1)
Lymphs: 11 %
MCH: 29.3 pg (ref 26.6–33.0)
MCHC: 33.3 g/dL (ref 31.5–35.7)
MCV: 88 fL (ref 79–97)
Monocytes Absolute: 0.4 10*3/uL (ref 0.1–0.9)
Monocytes: 5 %
Neutrophils Absolute: 6.4 10*3/uL (ref 1.4–7.0)
Neutrophils: 83 %
Platelets: 167 10*3/uL (ref 150–450)
RBC: 5.73 x10E6/uL (ref 4.14–5.80)
RDW: 13.1 % (ref 11.6–15.4)
WBC: 7.7 10*3/uL (ref 3.4–10.8)

## 2019-03-08 LAB — BASIC METABOLIC PANEL
BUN/Creatinine Ratio: 14 (ref 10–24)
BUN: 12 mg/dL (ref 8–27)
CO2: 24 mmol/L (ref 20–29)
Calcium: 8.8 mg/dL (ref 8.6–10.2)
Chloride: 103 mmol/L (ref 96–106)
Creatinine, Ser: 0.86 mg/dL (ref 0.76–1.27)
GFR calc Af Amer: 107 mL/min/{1.73_m2} (ref >59)
GFR calc non Af Amer: 93 mL/min/{1.73_m2} (ref >59)
Glucose: 90 mg/dL (ref 65–99)
Potassium: 3.9 mmol/L (ref 3.5–5.2)
Sodium: 141 mmol/L (ref 134–144)

## 2019-03-08 LAB — HEPATIC FUNCTION PANEL
ALT: 30 IU/L (ref 0–44)
AST: 22 IU/L (ref 0–40)
Albumin: 4.5 g/dL (ref 3.8–4.8)
Alkaline Phosphatase: 112 IU/L (ref 39–117)
Bilirubin Total: 1 mg/dL (ref 0.0–1.2)
Bilirubin, Direct: 0.19 mg/dL (ref 0.00–0.40)
Total Protein: 7.2 g/dL (ref 6.0–8.5)

## 2019-03-08 LAB — LIPID PANEL
Chol/HDL Ratio: 3.5 ratio (ref 0.0–5.0)
Cholesterol, Total: 158 mg/dL (ref 100–199)
HDL: 45 mg/dL (ref >39)
LDL Chol Calc (NIH): 89 mg/dL (ref 0–99)
Triglycerides: 139 mg/dL (ref 0–149)
VLDL Cholesterol Cal: 24 mg/dL (ref 5–40)

## 2019-05-17 ENCOUNTER — Other Ambulatory Visit: Payer: Self-pay | Admitting: Cardiovascular Disease

## 2019-05-28 ENCOUNTER — Other Ambulatory Visit: Payer: Self-pay

## 2019-05-28 ENCOUNTER — Ambulatory Visit (INDEPENDENT_AMBULATORY_CARE_PROVIDER_SITE_OTHER): Payer: BLUE CROSS/BLUE SHIELD | Admitting: Family Medicine

## 2019-05-28 ENCOUNTER — Encounter: Payer: Self-pay | Admitting: Family Medicine

## 2019-05-28 VITALS — Ht 65.0 in | Wt 185.0 lb

## 2019-05-28 DIAGNOSIS — F321 Major depressive disorder, single episode, moderate: Secondary | ICD-10-CM

## 2019-05-28 DIAGNOSIS — M25511 Pain in right shoulder: Secondary | ICD-10-CM

## 2019-05-28 DIAGNOSIS — M25561 Pain in right knee: Secondary | ICD-10-CM | POA: Diagnosis not present

## 2019-05-28 DIAGNOSIS — M79641 Pain in right hand: Secondary | ICD-10-CM

## 2019-05-28 DIAGNOSIS — M25562 Pain in left knee: Secondary | ICD-10-CM

## 2019-05-28 DIAGNOSIS — G8929 Other chronic pain: Secondary | ICD-10-CM

## 2019-05-28 MED ORDER — DULOXETINE HCL 30 MG PO CPEP: ORAL_CAPSULE | ORAL | 1 refills | Status: DC

## 2019-05-28 NOTE — Progress Notes (Signed)
Virtual Visit via telephone Note  This visit type was conducted due to national recommendations for restrictions regarding the COVID-19 pandemic (e.g. social distancing).  This format is felt to be most appropriate for this patient at this time.  All issues noted in this document were discussed and addressed.  No physical exam was performed (except for noted visual exam findings with Video Visits).   I connected with James Yates today at 10:30 AM EST by telephone and verified that I am speaking with the correct person using two identifiers. Location patient: home Location provider: work  Persons participating in the virtual visit: patient, provider, Derrell Milanes (daughter)  I discussed the limitations, risks, security and privacy concerns of performing an evaluation and management service by telephone and the availability of in person appointments. I also discussed with the patient that there may be a patient responsible charge related to this service. The patient expressed understanding and agreed to proceed.  Interactive audio and video telecommunications were attempted between this provider and patient, however failed, due to patient having technical difficulties OR patient did not have access to video capability.  We continued and completed visit with audio only.  Patient was seen with the assistance of a spanish interpretor over the phone. ID [id number].   Reason for visit: acute visit  HPI: Knee pain: Patient has bilateral knee pain that has been going on for at least 6 months.  Some days he cannot walk very well due to the discomfort.  He will limp and his knees feel weak.  Notes he cannot properly bend over.  Notes the discomfort is posteriorly behind his knees.  There was some swelling previously that resolved.  He does take Tylenol for this.  He denies any injuries.  Shoulder pain: This has been going on for 7 to 8 months.  Typically bothers him mostly when he sleeps.  He does  have some discomfort when reaching overhead.  Stretching usually improves this.  Notes his right shoulder hurts the most.  He denies any injuries.  Right hand pain: Patient notes this has been going on for 7 to 8 months as well.  He notes there is some swelling in the hand.  There is some discomfort.  He wonders about an x-ray of this.  He denies any injuries.  Depression: Patient notes this has been going on since Aug 27, 2022.  His father passed away and has been dealing with the joint pain as well as feeling down related to the pandemic.  He previously had some feelings that he should in things though he was able to determine that there was a solution and has not had those thoughts recently.  He has no current suicidal ideation.  He is willing to start treatment for depression.   ROS: See pertinent positives and negatives per HPI.  Past Medical History:  Diagnosis Date  . Coronary artery disease    s/p CABG  . Glucose intolerance (impaired glucose tolerance)   . Hyperlipidemia   . Hypertension   . Obesity   . S/P TAVR (transcatheter aortic valve replacement)    26 mm Edwards Sapien 3 transcatheter heart valve placed via percutaneous right transfemoral approach   . Severe aortic stenosis     Past Surgical History:  Procedure Laterality Date  . CARDIAC CATHETERIZATION  06/2013   [facility]  . CARDIAC CATHETERIZATION N/A 10/29/2015   Procedure: Left Heart Cath and Cors/Grafts Angiography;  Surgeon: Wellington Hampshire, MD;  Location: [facility] CV LAB;  Service: Cardiovascular;  Laterality: N/A;  . CORONARY ARTERY BYPASS GRAFT N/A 07/05/2013   Procedure: CORONARY ARTERY BYPASS GRAFTING (CABG);  Surgeon: [name], MD;  Location: [facility] OR;  Service: Open Heart Surgery;  Laterality: N/A;  . INTRAOPERATIVE TRANSESOPHAGEAL ECHOCARDIOGRAM N/A 07/05/2013   Procedure: INTRAOPERATIVE TRANSESOPHAGEAL ECHOCARDIOGRAM;  Surgeon: [name], MD;  Location: [facility] OR;  Service: Open Heart Surgery;  Laterality: N/A;   . INTRAOPERATIVE TRANSTHORACIC ECHOCARDIOGRAM N/A 03/13/2018   Procedure: INTRAOPERATIVE TRANSTHORACIC ECHOCARDIOGRAM;  Surgeon: Kathleene Hazel, MD;  Location: [facility] OR;  Service: Open Heart Surgery;  Laterality: N/A;  . NO PAST SURGERIES    . RIGHT/LEFT HEART CATH AND CORONARY ANGIOGRAPHY Bilateral 11/27/2017   Procedure: RIGHT/LEFT HEART CATH AND CORONARY ANGIOGRAPHY;  Surgeon: Iran Ouch, MD;  Location: [facility] INVASIVE CV LAB;  Service: Cardiovascular;  Laterality: Bilateral;  . TRANSCATHETER AORTIC VALVE REPLACEMENT, TRANSFEMORAL N/A 03/13/2018   Procedure: TRANSCATHETER AORTIC VALVE REPLACEMENT, TRANSFEMORAL. Edwards Sapien 3 Transcatheter Heart Valve size 68mm.;  Surgeon: Kathleene Hazel, MD;  Location: [facility] OR;  Service: Open Heart Surgery;  Laterality: N/A;    Family History  Problem Relation Age of Onset  . Heart disease Father   . Heart attack Father     SOCIAL HX: Non-smoker   Current Outpatient Medications:  .  amLODipine (NORVASC) 5 MG tablet, Take 1 tablet (5 mg total) by mouth daily., Disp: 90 tablet, Rfl: 3 .  amoxicillin (AMOXIL) 500 MG capsule, TAKE 4 CAPSULES BY MOUTH ONCE FOR 1 DOSE. TO BE TAKEN 30 60 MIN PRIOR TO DENTAL PROCEDURE., Disp: , Rfl:  .  aspirin EC 81 MG tablet, Take 1 tablet (81 mg total) by mouth daily., Disp: 30 tablet, Rfl: 3 .  atorvastatin (LIPITOR) 80 MG tablet, TAKE 1 TABLET (80 MG TOTAL) BY MOUTH DAILY AT 6 PM., Disp: 90 tablet, Rfl: 3 .  clopidogrel (PLAVIX) 75 MG tablet, Take 1 tablet (75 mg total) by mouth daily with breakfast., Disp: 90 tablet, Rfl: 2 .  metoprolol tartrate (LOPRESSOR) 50 MG tablet, TAKE 1 TABLET BY MOUTH TWICE A DAY, Disp: 180 tablet, Rfl: 0 .  DULoxetine (CYMBALTA) 30 MG capsule, Take 1 capsule (30 mg total) by mouth daily for 7 days, THEN 2 capsules (60 mg total) daily., Disp: 180 capsule, Rfl: 1  EXAM: This is a telehealth telephone visit and thus no physical exam was completed.  ASSESSMENT AND  PLAN:  Discussed the following assessment and plan:  Chronic pain of both knees Suspect osteoarthritis.  We will complete x-rays.  He can continue as needed Tylenol.  Cymbalta may help with his pain as well.  Chronic right shoulder pain Obtaining an x-ray to evaluate for underlying cause.  Right hand pain Obtaining an x-ray to evaluate for a potential cause.  Could be osteoarthritis.  Depression, major, single episode, moderate (HCC) Will start on Cymbalta.  I discussed if he developed any additional suicidal thoughts he should be evaluated in the emergency department.  We will follow-up in about 6 weeks for this.    I discussed the assessment and treatment plan with the patient. The patient was provided an opportunity to ask questions and all were answered. The patient agreed with the plan and demonstrated an understanding of the instructions.   The patient was advised to call back or seek an in-person evaluation if the symptoms worsen or if the condition fails to improve as anticipated.  I provided 33 minutes of non-face-to-face time during this encounter.   Minerva Areola  Caryl Bis, MD

## 2019-05-29 ENCOUNTER — Telehealth: Payer: Self-pay | Admitting: Family Medicine

## 2019-05-29 DIAGNOSIS — M79641 Pain in right hand: Secondary | ICD-10-CM | POA: Insufficient documentation

## 2019-05-29 DIAGNOSIS — M25562 Pain in left knee: Secondary | ICD-10-CM | POA: Insufficient documentation

## 2019-05-29 DIAGNOSIS — G8929 Other chronic pain: Secondary | ICD-10-CM | POA: Insufficient documentation

## 2019-05-29 DIAGNOSIS — F321 Major depressive disorder, single episode, moderate: Secondary | ICD-10-CM | POA: Insufficient documentation

## 2019-05-29 DIAGNOSIS — F419 Anxiety disorder, unspecified: Secondary | ICD-10-CM | POA: Insufficient documentation

## 2019-05-29 DIAGNOSIS — F32A Depression, unspecified: Secondary | ICD-10-CM | POA: Insufficient documentation

## 2019-05-29 NOTE — Assessment & Plan Note (Signed)
Will start on Cymbalta.  I discussed if he developed any additional suicidal thoughts he should be evaluated in the emergency department.  We will follow-up in about 6 weeks for this.

## 2019-05-29 NOTE — Assessment & Plan Note (Signed)
Obtaining an x-ray to evaluate for underlying cause.

## 2019-05-29 NOTE — Assessment & Plan Note (Signed)
Obtaining an x-ray to evaluate for a potential cause.  Could be osteoarthritis.

## 2019-05-29 NOTE — Assessment & Plan Note (Signed)
Suspect osteoarthritis.  We will complete x-rays.  He can continue as needed Tylenol.  Cymbalta may help with his pain as well.

## 2019-05-29 NOTE — Telephone Encounter (Signed)
I called pt and left a vm to call ofc to schedule Return in about 6 weeks (around 07/09/2019) for depression.

## 2019-06-17 ENCOUNTER — Ambulatory Visit
Admission: RE | Admit: 2019-06-17 | Discharge: 2019-06-17 | Disposition: A | Discharge status: Home / Self Care | Payer: BLUE CROSS/BLUE SHIELD | Source: Ambulatory Visit | Attending: Family Medicine | Admitting: Family Medicine

## 2019-06-17 DIAGNOSIS — M25562 Pain in left knee: Secondary | ICD-10-CM | POA: Insufficient documentation

## 2019-06-17 DIAGNOSIS — M79641 Pain in right hand: Secondary | ICD-10-CM | POA: Diagnosis present

## 2019-06-17 DIAGNOSIS — G8929 Other chronic pain: Secondary | ICD-10-CM | POA: Diagnosis present

## 2019-06-17 DIAGNOSIS — M25561 Pain in right knee: Secondary | ICD-10-CM | POA: Insufficient documentation

## 2019-06-17 DIAGNOSIS — M25511 Pain in right shoulder: Secondary | ICD-10-CM | POA: Diagnosis present

## 2019-06-20 ENCOUNTER — Other Ambulatory Visit: Payer: Self-pay | Admitting: Family Medicine

## 2019-06-20 DIAGNOSIS — M2550 Pain in unspecified joint: Secondary | ICD-10-CM

## 2019-07-01 ENCOUNTER — Telehealth: Payer: Self-pay | Admitting: Family Medicine

## 2019-07-01 NOTE — Telephone Encounter (Signed)
I called pt and left vm to call ofc to resch. 

## 2019-07-05 ENCOUNTER — Ambulatory Visit: Payer: BLUE CROSS/BLUE SHIELD | Admitting: Family Medicine

## 2019-07-05 ENCOUNTER — Other Ambulatory Visit: Payer: Self-pay

## 2019-07-05 ENCOUNTER — Encounter: Payer: Self-pay | Admitting: Family Medicine

## 2019-07-05 NOTE — Progress Notes (Signed)
Patient preferred to have visit in person.  He was rescheduled for next week.

## 2019-07-10 ENCOUNTER — Other Ambulatory Visit: Payer: Self-pay

## 2019-07-10 ENCOUNTER — Ambulatory Visit (INDEPENDENT_AMBULATORY_CARE_PROVIDER_SITE_OTHER): Payer: BLUE CROSS/BLUE SHIELD | Admitting: Family Medicine

## 2019-07-10 ENCOUNTER — Encounter: Payer: Self-pay | Admitting: Family Medicine

## 2019-07-10 ENCOUNTER — Telehealth: Payer: Self-pay | Admitting: Family Medicine

## 2019-07-10 VITALS — BP 140/80 | HR 96 | Temp 97.0°F | Ht 65.0 in | Wt 180.2 lb

## 2019-07-10 DIAGNOSIS — M2550 Pain in unspecified joint: Secondary | ICD-10-CM

## 2019-07-10 DIAGNOSIS — F321 Major depressive disorder, single episode, moderate: Secondary | ICD-10-CM

## 2019-07-10 DIAGNOSIS — Z1211 Encounter for screening for malignant neoplasm of colon: Secondary | ICD-10-CM | POA: Insufficient documentation

## 2019-07-10 DIAGNOSIS — T148XXA Other injury of unspecified body region, initial encounter: Secondary | ICD-10-CM | POA: Diagnosis not present

## 2019-07-10 LAB — CBC
HCT: 47.8 % (ref 39.0–52.0)
Hemoglobin: 16 g/dL (ref 13.0–17.0)
MCHC: 33.5 g/dL (ref 30.0–36.0)
MCV: 89.6 fl (ref 78.0–100.0)
Platelets: 171 10*3/uL (ref 150.0–400.0)
RBC: 5.33 Mil/uL (ref 4.22–5.81)
RDW: 13.6 % (ref 11.5–15.5)
WBC: 7.4 10*3/uL (ref 4.0–10.5)

## 2019-07-10 LAB — PROTIME-INR
INR: 1 ratio (ref 0.8–1.0)
Prothrombin Time: 11.5 s (ref 9.6–13.1)

## 2019-07-10 LAB — SEDIMENTATION RATE: Sed Rate: 14 mm/hr (ref 0–20)

## 2019-07-10 NOTE — Assessment & Plan Note (Signed)
Refer for colonoscopy 

## 2019-07-10 NOTE — Assessment & Plan Note (Signed)
Check CBC and INR.  Bruising likely related to trauma with being on aspirin and Plavix.

## 2019-07-10 NOTE — Patient Instructions (Signed)
Nice to see you. We will get lab work and contact you with the results. Please take your Cymbalta 60 mg once daily.

## 2019-07-10 NOTE — Assessment & Plan Note (Signed)
Much improved.  Discussed he could take the Cymbalta 60 mg once daily.

## 2019-07-10 NOTE — Telephone Encounter (Signed)
lvm for pt to set up 72m follow up

## 2019-07-10 NOTE — Progress Notes (Signed)
Tommi Rumps, MD Phone: [phone]  James Yates is a 64 y.o. male who presents today for follow-up.  Patient was seen with the assistance of a Spanish interpretor.  Arthralgia: Patient continues to have joint aches.  He notes his knees, right shoulder, and right hand hurt the most particularly.  He notes the pain in his right hand is not going away.  He notes occasional numbness on the dorsal aspect of his right hand.  And in his fingers at times.  That only occurs if he is using his fingers.  No numbness elsewhere.  No weakness.  No medications for this.  He notes the pain is stable.  Is 5 out of 10.  Depression: Patient notes this is quite a bit better with the Cymbalta.  He is taking 30 mg twice daily.  He is more calm with this.  No SI.  Bruising: Patient has bruising on his bilateral arms and on is feet at times.  He has no bleeding issues.    Social History   Tobacco Use  Smoking Status Never Smoker  Smokeless Tobacco Never Used     ROS see history of present illness  Objective  Physical Exam Vitals:   07/10/19 1042  BP: 140/80  Pulse: 96  Temp: (!) 97 F (36.1 C)  SpO2: 97%    BP Readings from Last 3 Encounters:  07/10/19 140/80  03/07/19 (!) 144/88  07/24/18 140/78   Wt Readings from Last 3 Encounters:  07/10/19 180 lb 3.2 oz (81.7 kg)  07/05/19 185 lb (83.9 kg)  05/28/19 185 lb (83.9 kg)    Physical Exam Constitutional:      General: He is not in acute distress.    Appearance: He is not diaphoretic.  Cardiovascular:     Rate and Rhythm: Normal rate and regular rhythm.     Heart sounds: Normal heart sounds.  Pulmonary:     Effort: Pulmonary effort is normal.     Breath sounds: Normal breath sounds.  Musculoskeletal:     Right lower leg: No edema.     Left lower leg: No edema.     Comments: Full range of motion bilateral shoulders, no tenderness either shoulder, there is discomfort on active internal range of motion of the right shoulder  and passive external rotation of the right shoulder, his left knee is tender adjacent to both sides of the patella, otherwise no left knee or right knee tenderness, no knee swelling, warmth, or erythema, negative McMurray's bilateral knees, bilateral hands with no swelling, right hand with some tenderness within the palm of the hand, negative Tinel's bilateral wrists  Skin:    General: Skin is warm and dry.  Neurological:     Mental Status: He is alert.     Comments: 5/5 strength in bilateral biceps, triceps, grip, quads, hamstrings, plantar and dorsiflexion, decreased sensation to light touch in the dorsum of his right hand, otherwise intact in bilateral UE and LE, normal gait      Assessment/Plan: Please see individual problem list.  Arthralgia Lab work as outlined below.  Consider referral to rheumatology once labs return.  Avoid NSAIDs given cardiac disease.  Bruising Check CBC and INR.  Bruising likely related to trauma with being on aspirin and Plavix.  Colon cancer screening Refer for colonoscopy.  Depression, major, single episode, moderate (HCC) Much improved.  Discussed he could take the Cymbalta 60 mg once daily.   Health Maintenance: Patient would like to get the COVID-19 vaccine.  I  discussed that he is not in group 1 or 2 so he will have to wait until his group is called.  Discussed he could potentially be a group 3 or 4 though it would depend on how they would categorize his job as working at [employer].  Orders Placed This Encounter  Procedures  . Sedimentation rate  . Antinuclear Antib (ANA)  . Rheumatoid Factor  . Cyclic citrul peptide antibody, IgG (QUEST)  . CBC  . Protime-INR  . Ambulatory referral to Gastroenterology    Referral Priority:   Routine    Referral Type:   Consultation    Referral Reason:   Specialty Services Required    Number of Visits Requested:   1    No orders of the defined types were placed in this encounter.   This visit occurred  during the SARS-CoV-2 public health emergency.  Safety protocols were in place, including screening questions prior to the visit, additional usage of staff PPE, and extensive cleaning of exam room while observing appropriate contact time as indicated for disinfecting solutions.    Marikay Alar, MD [facility]

## 2019-07-10 NOTE — Assessment & Plan Note (Signed)
Lab work as outlined below.  Consider referral to rheumatology once labs return.  Avoid NSAIDs given cardiac disease.

## 2019-07-12 LAB — RHEUMATOID FACTOR: Rheumatoid fact SerPl-aCnc: <14 IU/mL (ref <14)

## 2019-07-12 LAB — CYCLIC CITRUL PEPTIDE ANTIBODY, IGG: Cyclic Citrullin Peptide Ab: <16 UNITS

## 2019-07-12 LAB — ANA: Anti Nuclear Antibody (ANA): NEGATIVE

## 2019-07-14 ENCOUNTER — Other Ambulatory Visit: Payer: Self-pay | Admitting: Family Medicine

## 2019-08-16 ENCOUNTER — Other Ambulatory Visit: Payer: Self-pay | Admitting: Cardiovascular Disease

## 2019-09-12 ENCOUNTER — Other Ambulatory Visit: Payer: Self-pay

## 2019-09-12 ENCOUNTER — Encounter: Payer: Self-pay | Admitting: Cardiovascular Disease

## 2019-09-12 ENCOUNTER — Ambulatory Visit (INDEPENDENT_AMBULATORY_CARE_PROVIDER_SITE_OTHER): Payer: BLUE CROSS/BLUE SHIELD | Admitting: Cardiovascular Disease

## 2019-09-12 VITALS — BP 124/78 | HR 79 | Ht 65.0 in | Wt 186.0 lb

## 2019-09-12 DIAGNOSIS — I25708 Atherosclerosis of coronary artery bypass graft(s), unspecified, with other forms of angina pectoris: Secondary | ICD-10-CM | POA: Diagnosis not present

## 2019-09-12 DIAGNOSIS — Z952 Presence of prosthetic heart valve: Secondary | ICD-10-CM

## 2019-09-12 DIAGNOSIS — E785 Hyperlipidemia, unspecified: Secondary | ICD-10-CM | POA: Diagnosis not present

## 2019-09-12 DIAGNOSIS — I10 Essential (primary) hypertension: Secondary | ICD-10-CM

## 2019-09-12 NOTE — Progress Notes (Signed)
Cardiology Office Note   Date:  09/12/2019   ID:  James Yates, DOB 1956/05/09, MRN [id number]  PCP: None Cardiologist:   [name], MD   Chief Complaint  Patient presents with  . other    6 mth f/u      History of Present Illness: James Yates is a 64 y.o. male who presents for a followup visit regarding coronary artery disease status post CABG in February of 2015 after NSTEMI and severe aortic stenosis due to bicuspid aortic valve status post TAVR in October 2019. Other medical problems include hypertension, hyperlipidemia and RBBB . Cardiac catheterization in July 2019 showed significant underlying three-vessel coronary artery disease with patent LIMA to LAD, patent SVG to RCA and known chronically occluded SVG to OM 3 with occluded native left circumflex. The patient underwent TAVR in October 2019 . He has been doing reasonably well and describes continued episodes of substernal chest tightness with overexertion which has been present for a while and consistent with his ischemia in the left circumflex distribution. He complains of increased heartburn.  Past Medical History:  Diagnosis Date  . Coronary artery disease    s/p CABG  . Glucose intolerance (impaired glucose tolerance)   . Hyperlipidemia   . Hypertension   . Obesity   . S/P TAVR (transcatheter aortic valve replacement)    26 mm Edwards Sapien 3 transcatheter heart valve placed via percutaneous right transfemoral approach   . Severe aortic stenosis     Past Surgical History:  Procedure Laterality Date  . CARDIAC CATHETERIZATION  06/2013   [facility]  . CARDIAC CATHETERIZATION N/A 10/29/2015   Procedure: Left Heart Cath and Cors/Grafts Angiography;  Surgeon: Wellington Hampshire, MD;  Location: [facility];  Service: Cardiovascular;  Laterality: N/A;  . CORONARY ARTERY BYPASS GRAFT N/A 07/05/2013   Procedure: CORONARY ARTERY BYPASS GRAFTING (CABG);  Surgeon: Ivin Poot, MD;  Location: [facility];   Service: Open Heart Surgery;  Laterality: N/A;  . INTRAOPERATIVE TRANSESOPHAGEAL ECHOCARDIOGRAM N/A 07/05/2013   Procedure: INTRAOPERATIVE TRANSESOPHAGEAL ECHOCARDIOGRAM;  Surgeon: Ivin Poot, MD;  Location: [place];  Service: Open Heart Surgery;  Laterality: N/A;  . INTRAOPERATIVE TRANSTHORACIC ECHOCARDIOGRAM N/A 03/13/2018   Procedure: INTRAOPERATIVE TRANSTHORACIC ECHOCARDIOGRAM;  Surgeon: Burnell Blanks, MD;  Location: [facility];  Service: Open Heart Surgery;  Laterality: N/A;  . NO PAST SURGERIES    . RIGHT/LEFT HEART CATH AND CORONARY ANGIOGRAPHY Bilateral 11/27/2017   Procedure: RIGHT/LEFT HEART CATH AND CORONARY ANGIOGRAPHY;  Surgeon: Wellington Hampshire, MD;  Location: [facility];  Service: Cardiovascular;  Laterality: Bilateral;  . TRANSCATHETER AORTIC VALVE REPLACEMENT, TRANSFEMORAL N/A 03/13/2018   Procedure: TRANSCATHETER AORTIC VALVE REPLACEMENT, TRANSFEMORAL. Edwards Sapien 3 Transcatheter Heart Valve size 40mm.;  Surgeon: Burnell Blanks, MD;  Location: [facility];  Service: Open Heart Surgery;  Laterality: N/A;     Current Outpatient Medications  Medication Sig Dispense Refill  . amLODipine (NORVASC) 5 MG tablet Take 1 tablet (5 mg total) by mouth daily. 90 tablet 3  . amoxicillin (AMOXIL) 500 MG capsule TAKE 4 CAPSULES BY MOUTH ONCE FOR 1 DOSE. TO BE TAKEN 30 60 MIN PRIOR TO DENTAL PROCEDURE.    Marland Kitchen aspirin EC 81 MG tablet Take 1 tablet (81 mg total) by mouth daily. 30 tablet 3  . atorvastatin (LIPITOR) 80 MG tablet TAKE 1 TABLET (80 MG TOTAL) BY MOUTH DAILY AT 6 PM. 90 tablet 3  . DULoxetine (CYMBALTA) 30 MG capsule Take 1 capsule (  30 mg total) by mouth daily for 7 days, THEN 2 capsules (60 mg total) daily. 180 capsule 1  . metoprolol tartrate (LOPRESSOR) 50 MG tablet Take 1 tablet (50 mg total) by mouth 2 (two) times daily. Please schedule appointment for further refills. Thank you! 180 tablet 0   No current facility-administered medications for this visit.     Allergies:   Patient has no known allergies.    Social History:  The patient  reports that he has never smoked. He has never used smokeless tobacco. He reports that he does not drink alcohol or use drugs.   Family History:  The patient's family history includes Heart attack in his father; Heart disease in his father.    ROS:  Please see the history of present illness.   Otherwise, review of systems are positive for none.   All other systems are reviewed and negative.    PHYSICAL EXAM: VS:  BP 124/78   Pulse 79   Ht 5\' 5"  (1.651 m)   Wt 186 lb (84.4 kg)   SpO2 98%   BMI 30.95 kg/m  , BMI Body mass index is 30.95 kg/m. GEN: Well nourished, well developed, in no acute distress  HEENT: normal  Neck: no JVD, carotid bruits, or masses Cardiac: RRR; no  rubs, or gallops,no edema . There is a 2/6 systolic aortic murmur which is a early peaking. Respiratory:  clear to auscultation bilaterally, normal work of breathing GI: soft, nontender, nondistended, + BS MS: no deformity or atrophy  Skin: warm and dry, no rash Neuro:  Strength and sensation are intact Psych: euthymic mood, full affect   EKG:  EKG is  ordered today. EKG done showed normal sinus rhythm with right bundle branch block .  Possible old inferior infarct  Recent Labs: 03/07/2019: ALT 30; BUN 12; Creatinine, Ser 0.86; Potassium 3.9; Sodium 141 07/10/2019: Hemoglobin 16.0; Platelets 171.0    Lipid Panel    Component Value Date/Time   CHOL 158 03/07/2019 1033   CHOL 215 (H) 07/02/2013 0058   TRIG 139 03/07/2019 1033   TRIG 143 07/02/2013 0058   HDL 45 03/07/2019 1033   HDL 38 (L) 07/02/2013 0058   CHOLHDL 3.5 03/07/2019 1033   CHOLHDL 3.3 01/08/2018 1552   VLDL 25 01/08/2018 1552   VLDL 29 07/02/2013 0058   LDLCALC 89 03/07/2019 1033   LDLCALC 148 (H) 07/02/2013 0058      Wt Readings from Last 3 Encounters:  09/12/19 186 lb (84.4 kg)  07/10/19 180 lb 3.2 oz (81.7 kg)  07/05/19 185 lb (83.9 kg)         ASSESSMENT AND PLAN:  1.  Status post TAVR for severe aortic stenosis: He is doing well overall with stable symptoms.  He has residual angina related to occluded SVG to left circumflex and occluded native left circumflex.  [place] heart association class II. Continue with antibiotic prophylaxis before dental procedures. Most recent echocardiogram in October 2020 showed normal LV systolic function and normal functioning TAVR prosthesis with a mean gradient of 10 mmHg and valve area of 1.9 cm.  He is having increased heartburn likely due to dual antiplatelet therapy.  I elected to discontinue clopidogrel today.  Continue aspirin indefinitely.  2. Coronary artery disease involving native coronary arteries with mild angina: Continue antianginal therapy with amlodipine and metoprolol.  3. Essential hypertension: Blood pressure is now well controlled since amlodipine was added last year  4. Hyperlipidemia: Continue high dose atorvastatin.  Most recent  lipid profile showed an LDL of 89.  Zetia can be considered.    Disposition:   FU with me in 6 months  Signed,  Lorine Bears, MD  09/12/2019 2:12 PM    [facility]

## 2019-09-12 NOTE — Patient Instructions (Signed)
Medication Instructions:  Your physician has recommended you make the following change in your medication:   STOP Plavix  Continue all other medications as prescribed.  *If you need a refill on your cardiac medications before your next appointment, please call your pharmacy*   Lab Work: None ordered If you have labs (blood work) drawn today and your tests are completely normal, you will receive your results only by: Marland Kitchen MyChart Message (if you have MyChart) OR . A paper copy in the mail If you have any lab test that is abnormal or we need to change your treatment, we will call you to review the results.   Testing/Procedures: None ordered   Follow-Up: At [facility], you and your health needs are our priority.  As part of our continuing mission to provide you with exceptional heart care, we have created designated Provider Care Teams.  These Care Teams include your primary Cardiologist (physician) and Advanced Practice Providers (APPs -  Physician Assistants and Nurse Practitioners) who all work together to provide you with the care you need, when you need it.  We recommend signing up for the patient portal called "MyChart".  Sign up information is provided on this After Visit Summary.  MyChart is used to connect with patients for Virtual Visits (Telemedicine).  Patients are able to view lab/test results, encounter notes, upcoming appointments, etc.  Non-urgent messages can be sent to your provider as well.   To learn more about what you can do with MyChart, go to [email or web address].    Your next appointment:   6 month(s)  The format for your next appointment:   In Person  Provider:    You may see Lorine Bears, MD or one of the following Advanced Practice Providers on your designated Care Team:    Nicolasa Ducking, NP  Eula Listen, PA-C  Marisue Ivan, PA-C    Other Instructions N/A

## 2019-11-08 ENCOUNTER — Other Ambulatory Visit: Payer: Self-pay | Admitting: Cardiovascular Disease

## 2019-11-22 ENCOUNTER — Other Ambulatory Visit: Payer: Self-pay | Admitting: Family Medicine

## 2020-02-01 IMAGING — CR DG HAND COMPLETE 3+V*R*
1 series · 3 of 3 positions shown · non-contrast
Comparison: None.

CLINICAL DATA: Right hand pain and swelling for several months
without known injury.

EXAM:
RIGHT HAND - COMPLETE 3+ VIEW

[Series 1: dg hand complete right · 0.14mm/px · 3 of 3 slices shown]
[im 1/3]
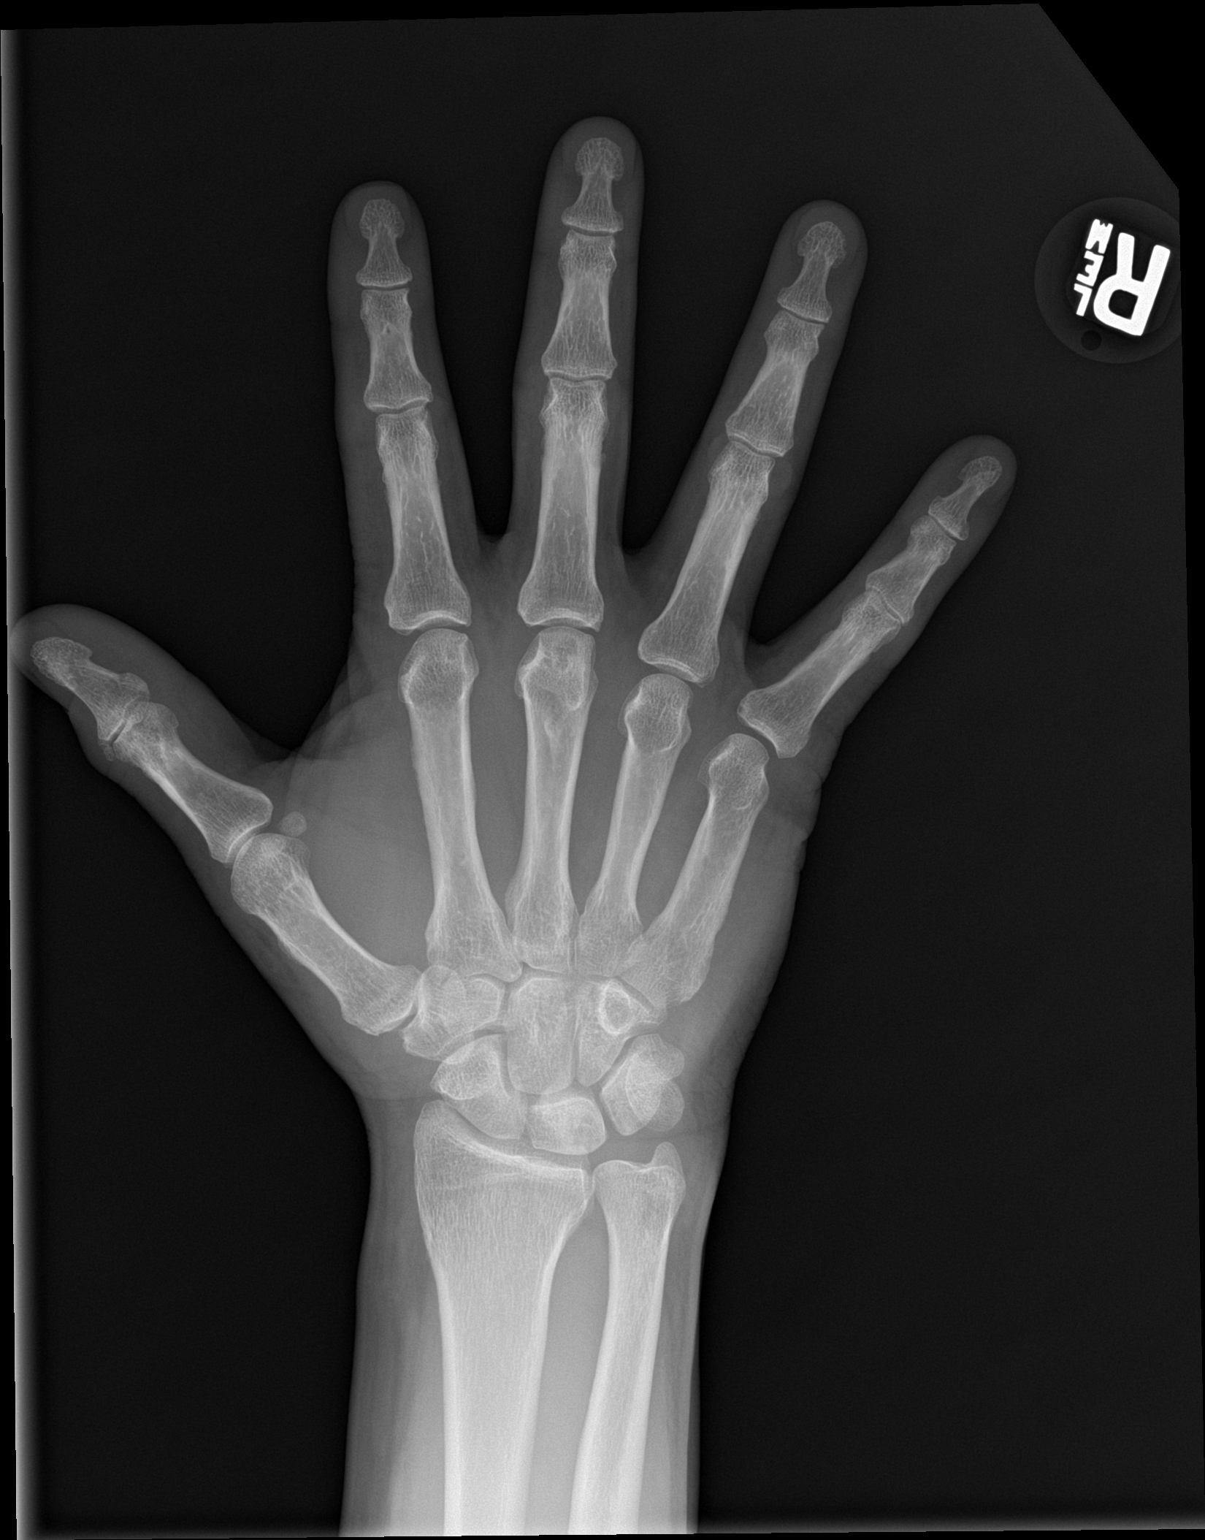
[im 2/3]
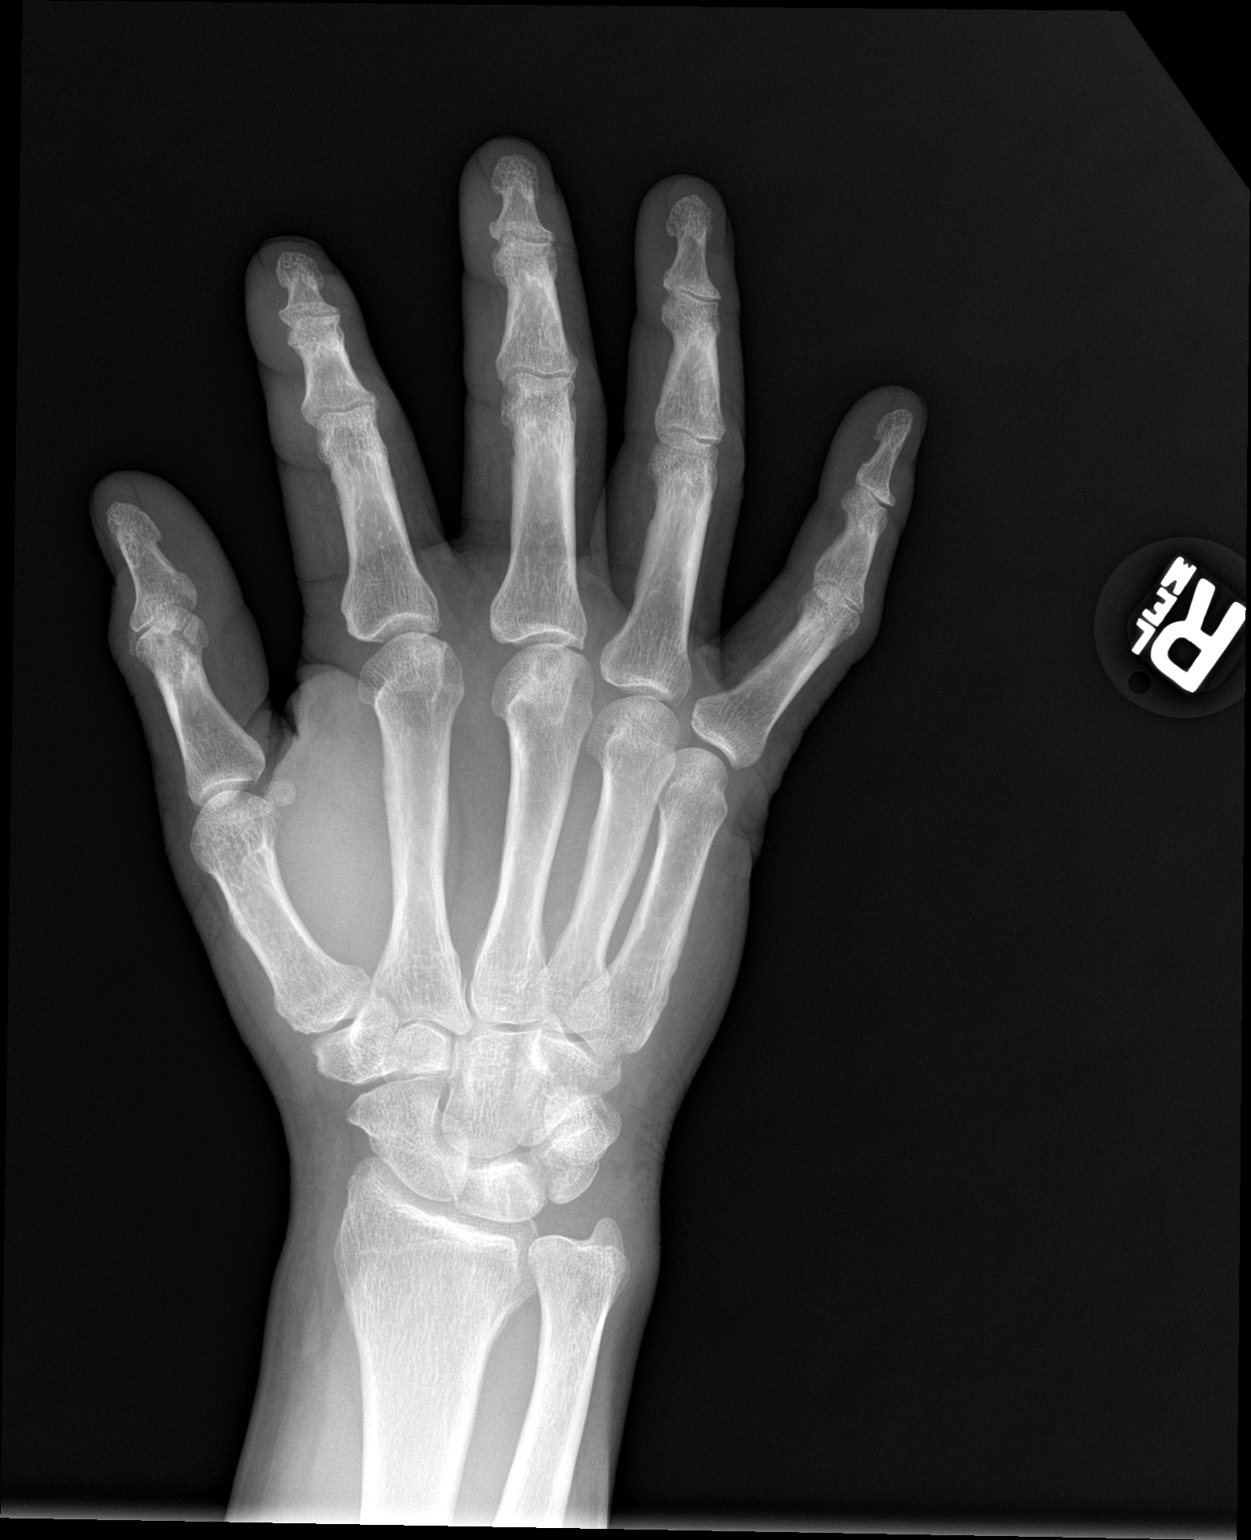
[im 3/3]
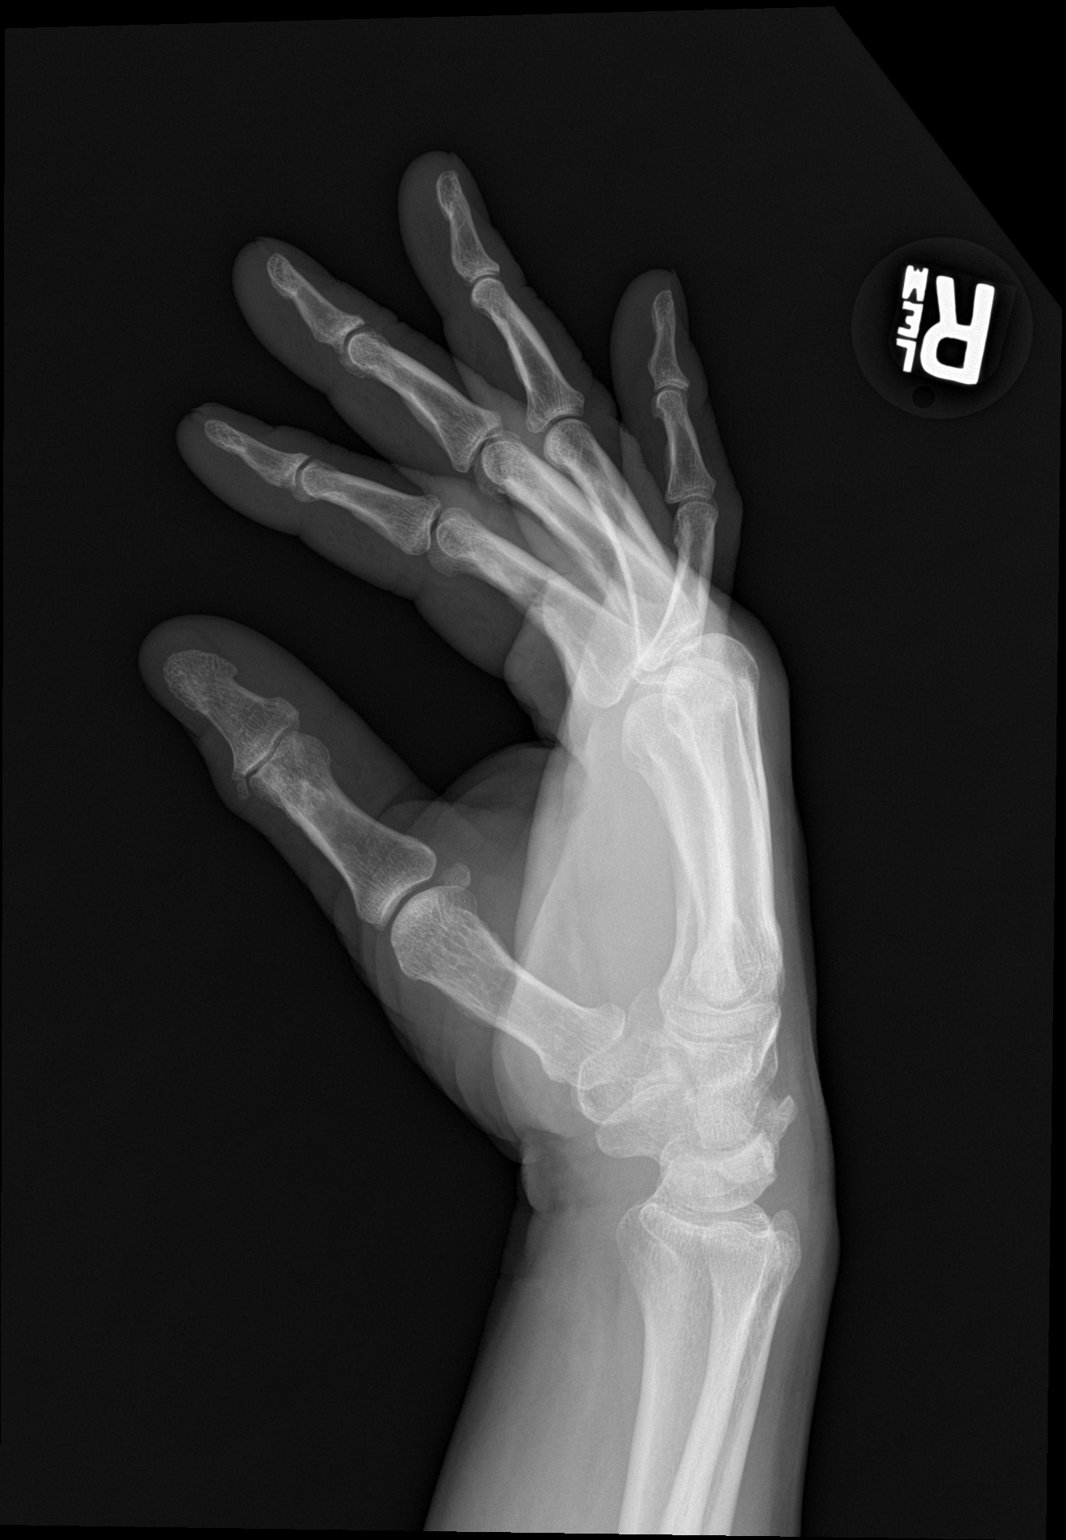

[3 of 3 positions shown; findings below may reference images not displayed]

FINDINGS: There is no evidence of fracture or dislocation. There is no
evidence of arthropathy or other focal bone abnormality. Soft
tissues are unremarkable.
IMPRESSION: Negative.

## 2020-03-11 ENCOUNTER — Other Ambulatory Visit: Payer: Self-pay | Admitting: Cardiovascular Disease

## 2020-03-13 ENCOUNTER — Other Ambulatory Visit: Payer: Self-pay | Admitting: Cardiovascular Disease

## 2020-03-17 ENCOUNTER — Other Ambulatory Visit: Payer: Self-pay | Admitting: Cardiovascular Disease

## 2020-03-17 ENCOUNTER — Ambulatory Visit: Payer: BLUE CROSS/BLUE SHIELD | Admitting: Cardiovascular Disease

## 2020-03-23 NOTE — Progress Notes (Signed)
Cardiology Office Note    Date:  03/31/2020   ID:  James Yates, DOB September 13, 1955, MRN [id number]  PCP:  Glori Luis, MD  Cardiologist:  Lorine Bears, MD  Electrophysiologist:  None   Chief Complaint: Follow up  History of Present Illness:   James Yates is a 64 y.o. male with history of CAD s/p CABG in 06/2013 following a NSTEMI, bicuspid aortic valve with severe stenosis s/p TAVR in 02/2018, HTN, and HLD who presents for follow up of his CAD and TAVR.  LHC in 10/2015 showed severe underlying 3-vessel CAD with a patent LIMA-LAD, and SVG-RCA. The SVG-LCx was occluded. The native LCx was occluded proximally with faint collaterals. There was moderate aortic stenosis with a peak to peak gradient of 22 mmHg. This LHC was reviewed with our CTO team with the opinion being the LCx was not optimal for PCI. Echo in 09/2017 showed an EF of 60-65%, normal wall motion, progression of his aortic valve stenosis to severe with a peak velocity of 422 cm/s, mean gradient of 38 mmHg, and apeak gradient of 71 mmHg, left atrium was normal in size, RVSF normal, PASP 46 mmHg. He was seen on 11/23/2017 with worsening exertional chest tightness. He underwent R/LHC on 11/27/2017 that showed significant underlying 3-vessel CAD with a patent LIMA to LAD (could not be selectively engaged), patent SVG to RCA and known occluded SVG to OM3 along with an occluded LCx. There was moderate to severe aortic stenosis with a mean gradient of 26 mmHg and a valve area of 1.2. However, his aortic stenosis was felt to be likely severe based on echo findings and his valve area noted on 11/27/2017 was felt to be overestimated due to high cardiac output. Valve area of echo noted to be 0.6. The aortic valve morphology was also consistent with severe aortic stenosis. RHC showed mildly elevated filling pressures with mild pulmonary hypertension. He subsequently underwent TAVR in 02/2018. Most recent echo from 02/2019 showed an EF of  60-65%, normal RVSF and ventricular cavity size, normal PASP, and a normal functioning bioprosthetic aortic valve with a mean gradient of 10 mmHg. He was most recently seen in the office in 08/2019 and was doing reasonably well. He did note some continued episodes of substernal chest tightness with overexertion felt to be related to his known occluded SVG to LCx and occluded native LCx. In the context of increased heartburn, Plavix was discontinued.   He comes in today doing reasonably well from a cardiac perspective.  Over the past year he has noted to separate episodes of dizziness associated with palpitations with the first episode occurring in 07/2019 and the most recent episode occurring in 02/2020.  Indicates episodes last for several minutes though it takes him approximately 60 minutes to fully recover and feel back to baseline.  Episodes are not associated with dyspnea or chest pain.  They do not feel like his prior angina.  He also notes some bilateral shoulder discomfort that occurs when sitting at rest.  He wonders if this is related to the Covid vaccine.  Symptoms do not feel like his prior angina.  He is currently without shoulder pain.  He denies any falls, hematochezia, or melena since he was last seen.  Blood pressure remains well controlled.  He is tolerating all medications without issues.  He has not had any dental work since he was last seen.  He understands to take amoxicillin 60 minutes prior to dental work given his aortic valve replacement.  Labs independently reviewed: 07/2019 - HGB 16.0, PLT 171 02/2019 - TC 158, TG 139, HDL 45, LDL 89, BUN 12, SCr 0.86, potassium 3.9 02/2018 - A1c 6.3  Past Medical History:  Diagnosis Date  . Coronary artery disease    s/p CABG  . Glucose intolerance (impaired glucose tolerance)   . Hyperlipidemia   . Hypertension   . Obesity   . S/P TAVR (transcatheter aortic valve replacement)    26 mm Edwards Sapien 3 transcatheter heart valve placed  via percutaneous right transfemoral approach   . Severe aortic stenosis     Past Surgical History:  Procedure Laterality Date  . CARDIAC CATHETERIZATION  06/2013   [facility]  . CARDIAC CATHETERIZATION N/A 10/29/2015   Procedure: Left Heart Cath and Cors/Grafts Angiography;  Surgeon: Iran Ouch, MD;  Location: [facility];  Service: Cardiovascular;  Laterality: N/A;  . CORONARY ARTERY BYPASS GRAFT N/A 07/05/2013   Procedure: CORONARY ARTERY BYPASS GRAFTING (CABG);  Surgeon: [name], MD;  Location: [facility];  Service: Open Heart Surgery;  Laterality: N/A;  . INTRAOPERATIVE TRANSESOPHAGEAL ECHOCARDIOGRAM N/A 07/05/2013   Procedure: INTRAOPERATIVE TRANSESOPHAGEAL ECHOCARDIOGRAM;  Surgeon: [name], MD;  Location: [facility];  Service: Open Heart Surgery;  Laterality: N/A;  . INTRAOPERATIVE TRANSTHORACIC ECHOCARDIOGRAM N/A 03/13/2018   Procedure: INTRAOPERATIVE TRANSTHORACIC ECHOCARDIOGRAM;  Surgeon: Kathleene Hazel, MD;  Location: [facility] OR;  Service: Open Heart Surgery;  Laterality: N/A;  . NO PAST SURGERIES    . RIGHT/LEFT HEART CATH AND CORONARY ANGIOGRAPHY Bilateral 11/27/2017   Procedure: RIGHT/LEFT HEART CATH AND CORONARY ANGIOGRAPHY;  Surgeon: Iran Ouch, MD;  Location: [facility];  Service: Cardiovascular;  Laterality: Bilateral;  . TRANSCATHETER AORTIC VALVE REPLACEMENT, TRANSFEMORAL N/A 03/13/2018   Procedure: TRANSCATHETER AORTIC VALVE REPLACEMENT, TRANSFEMORAL. Edwards Sapien 3 Transcatheter Heart Valve size 28mm.;  Surgeon: Kathleene Hazel, MD;  Location: [facility];  Service: Open Heart Surgery;  Laterality: N/A;    Current Medications: Current Meds  Medication Sig  . amLODipine (NORVASC) 5 MG tablet TAKE 1 TABLET BY MOUTH EVERY DAY  . amoxicillin (AMOXIL) 500 MG capsule TAKE 4 CAPSULES BY MOUTH ONCE FOR 1 DOSE. TO BE TAKEN 30 60 MIN PRIOR TO DENTAL PROCEDURE.  Marland Kitchen aspirin EC 81 MG tablet Take 1 tablet (81 mg total) by mouth daily.  Marland Kitchen atorvastatin  (LIPITOR) 80 MG tablet TAKE 1 TABLET (80 MG TOTAL) BY MOUTH DAILY AT 6 PM.  . metoprolol tartrate (LOPRESSOR) 50 MG tablet TAKE 1 TABLET (50 MG TOTAL) BY MOUTH 2 (TWO) TIMES DAILY. PLEASE SCHEDULE APPOINTMENT FOR FURTHER REFILLS. THANK YOU!    Allergies:   Patient has no known allergies.   Social History   Socioeconomic History  . Marital status: Married    Spouse name: Not on file  . Number of children: Not on file  . Years of education: Not on file  . Highest education level: Not on file  Occupational History  . Not on file  Tobacco Use  . Smoking status: Never Smoker  . Smokeless tobacco: Never Used  Vaping Use  . Vaping Use: Never used  Substance and Sexual Activity  . Alcohol use: No  . Drug use: No  . Sexual activity: Not on file  Other Topics Concern  . Not on file  Social History Narrative  . Not on file   Social Determinants of Health   Financial Resource Strain:   . Difficulty of Paying Living Expenses: Not on file  Food  Insecurity:   . Worried About Programme researcher, broadcasting/film/video in the Last Year: Not on file  . Ran Out of Food in the Last Year: Not on file  Transportation Needs:   . Lack of Transportation (Medical): Not on file  . Lack of Transportation (Non-Medical): Not on file  Physical Activity:   . Days of Exercise per Week: Not on file  . Minutes of Exercise per Session: Not on file  Stress:   . Feeling of Stress : Not on file  Social Connections:   . Frequency of Communication with Friends and Family: Not on file  . Frequency of Social Gatherings with Friends and Family: Not on file  . Attends Religious Services: Not on file  . Active Member of Clubs or Organizations: Not on file  . Attends Banker Meetings: Not on file  . Marital Status: Not on file     Family History:  The patient's family history includes Heart attack in his father; Heart disease in his father.  ROS:   Review of Systems  Constitutional: Positive for malaise/fatigue.  Negative for chills, diaphoresis, fever and weight loss.  HENT: Negative for congestion.   Eyes: Negative for discharge and redness.  Respiratory: Negative for cough, hemoptysis, sputum production, shortness of breath and wheezing.   Cardiovascular: Positive for palpitations. Negative for chest pain, orthopnea, claudication, leg swelling and PND.  Gastrointestinal: Negative for abdominal pain, blood in stool, heartburn, melena, nausea and vomiting.  Genitourinary: Negative for hematuria.  Musculoskeletal: Positive for myalgias. Negative for falls.  Skin: Negative for rash.  Neurological: Positive for dizziness. Negative for tingling, tremors, sensory change, speech change, focal weakness, loss of consciousness and weakness.  Endo/Heme/Allergies: Does not bruise/bleed easily.  Psychiatric/Behavioral: Negative for substance abuse. The patient is not nervous/anxious.   All other systems reviewed and are negative.    EKGs/Labs/Other Studies Reviewed:    Studies reviewed were summarized above. The additional studies were reviewed today:  2D echo 02/2019: 1. Left ventricular ejection fraction, by visual estimation, is 60 to  65%. The left ventricle has normal function. Normal left ventricular size.  There is no left ventricular hypertrophy.  2. Global right ventricle has normal systolic function.The right  ventricular size is normal. No increase in right ventricular wall  thickness.  3. Left atrial size was normal.  4. Normal pulmonary artery systolic pressure.  5. A bioprosthetic aortic valve was present. 61mm Edwards Sapien TAVR,  mean gradient 10 mm Hg, peak velocity 214 cm/sec  EKG:  EKG is ordered today.  The EKG ordered today demonstrates NSR, 80 bpm, RBBB, possible prior inferior infarct, no significant change compared to prior tracing  Recent Labs: 07/10/2019: Hemoglobin 16.0; Platelets 171.0  Recent Lipid Panel    Component Value Date/Time   CHOL 158 03/07/2019 1033    CHOL 215 (H) 07/02/2013 0058   TRIG 139 03/07/2019 1033   TRIG 143 07/02/2013 0058   HDL 45 03/07/2019 1033   HDL 38 (L) 07/02/2013 0058   CHOLHDL 3.5 03/07/2019 1033   CHOLHDL 3.3 01/08/2018 1552   VLDL 25 01/08/2018 1552   VLDL 29 07/02/2013 0058   LDLCALC 89 03/07/2019 1033   LDLCALC 148 (H) 07/02/2013 0058    PHYSICAL EXAM:    VS:  BP 120/80   Pulse 80   Ht 5\' 5"  (1.651 m)   Wt 193 lb (87.5 kg)   BMI 32.12 kg/m   BMI: Body mass index is 32.12 kg/m.  Physical Exam Constitutional:  Appearance: He is well-developed.  HENT:     Head: Normocephalic and atraumatic.  Eyes:     General:        Right eye: No discharge.        Left eye: No discharge.  Neck:     Vascular: No JVD.  Cardiovascular:     Rate and Rhythm: Normal rate and regular rhythm.     Pulses: No midsystolic click and no opening snap.     Heart sounds: S1 normal and S2 normal. Heart sounds not distant. Murmur heard.  Systolic murmur is present with a grade of 2/6 at the upper right sternal border.  No friction rub.  Pulmonary:     Effort: Pulmonary effort is normal. No respiratory distress.     Breath sounds: Normal breath sounds. No decreased breath sounds, wheezing or rales.  Chest:     Chest wall: No tenderness.  Abdominal:     General: There is no distension.     Palpations: Abdomen is soft.     Tenderness: There is no abdominal tenderness.  Musculoskeletal:     Cervical back: Normal range of motion.  Skin:    General: Skin is warm and dry.     Nails: There is no clubbing.  Neurological:     Mental Status: He is alert and oriented to person, place, and time.  Psychiatric:        Speech: Speech normal.        Behavior: Behavior normal.        Thought Content: Thought content normal.        Judgment: Judgment normal.     Wt Readings from Last 3 Encounters:  03/31/20 193 lb (87.5 kg)  09/12/19 186 lb (84.4 kg)  07/10/19 180 lb 3.2 oz (81.7 kg)     ASSESSMENT & PLAN:   1. Severe  aortic stenosis s/p TAVR: Stable on most recent echo.  Continue aspirin.  Dental prophylaxis with amoxicillin prior to dental procedures.  This was discussed in detail at today's visit.  Update echo.  2. CAD involving the native coronary arteries without angina: No symptoms suggestive of angina.  Continue antianginal therapy with amlodipine and metoprolol.  He remains on aspirin indefinitely.  No indication for further ischemic testing at this time.  3. Dizziness/palpitations: Infrequently occurring twice this year, initially in 07/2019 and more recently in 02/2020.  Discussed with the patient limitations of outpatient cardiac monitoring given the infrequent nature of his symptoms.  Agreed to proceed with a 2-week Zio patch.  If this is unrevealing patient can consider obtaining a Kardia device to monitor his symptoms.  Update echo as outlined above.  4. HTN: Blood pressure is well controlled in the office.  Continue amlodipine and metoprolol.  5. HLD: LDL 89 from 02/2019.  Update lipid panel and LFT.  If LDL remains above goal of less than 70 recommend adding Zetia.  Otherwise, he will continue atorvastatin 80 mg.  6. Language barrier: [place] medical interpreter utilized for today's visit.  7. Bilateral shoulder discomfort: Appears to be MSK in etiology.  Check CK.  Follow-up with PCP.  Disposition: F/u with Dr. Kirke CorinArida or an APP in 2 months.   Medication Adjustments/Labs and Tests Ordered: Current medicines are reviewed at length with the patient today.  Concerns regarding medicines are outlined above. Medication changes, Labs and Tests ordered today are summarized above and listed in the Patient Instructions accessible in Encounters.   Signed, Eula Listenyan Parthena Fergeson, PA-C 03/31/2020 10:30 AM  [place], [place] [address] [phone]

## 2020-03-31 ENCOUNTER — Other Ambulatory Visit: Payer: Self-pay

## 2020-03-31 ENCOUNTER — Ambulatory Visit: Payer: BLUE CROSS/BLUE SHIELD | Admitting: Physician Assistant

## 2020-03-31 ENCOUNTER — Ambulatory Visit (INDEPENDENT_AMBULATORY_CARE_PROVIDER_SITE_OTHER): Payer: BLUE CROSS/BLUE SHIELD

## 2020-03-31 ENCOUNTER — Encounter: Payer: Self-pay | Admitting: Physician Assistant

## 2020-03-31 VITALS — BP 120/80 | HR 80 | Ht 65.0 in | Wt 193.0 lb

## 2020-03-31 DIAGNOSIS — Z603 Acculturation difficulty: Secondary | ICD-10-CM

## 2020-03-31 DIAGNOSIS — I350 Nonrheumatic aortic (valve) stenosis: Secondary | ICD-10-CM | POA: Diagnosis not present

## 2020-03-31 DIAGNOSIS — Z952 Presence of prosthetic heart valve: Secondary | ICD-10-CM | POA: Diagnosis not present

## 2020-03-31 DIAGNOSIS — M791 Myalgia, unspecified site: Secondary | ICD-10-CM

## 2020-03-31 DIAGNOSIS — I10 Essential (primary) hypertension: Secondary | ICD-10-CM

## 2020-03-31 DIAGNOSIS — I25810 Atherosclerosis of coronary artery bypass graft(s) without angina pectoris: Secondary | ICD-10-CM

## 2020-03-31 DIAGNOSIS — R002 Palpitations: Secondary | ICD-10-CM

## 2020-03-31 DIAGNOSIS — R42 Dizziness and giddiness: Secondary | ICD-10-CM

## 2020-03-31 DIAGNOSIS — Z951 Presence of aortocoronary bypass graft: Secondary | ICD-10-CM

## 2020-03-31 DIAGNOSIS — Z789 Other specified health status: Secondary | ICD-10-CM

## 2020-03-31 DIAGNOSIS — E785 Hyperlipidemia, unspecified: Secondary | ICD-10-CM

## 2020-03-31 NOTE — Patient Instructions (Signed)
Medication Instructions:   1. Your physician recommends that you continue on your current medications as directed. Please refer to the Current Medication list given to you today.  *If you need a refill on your cardiac medications before your next appointment, please call your pharmacy*   Lab Work:  1. Your physician recommends that you have lab work today CMP / Lipid / CBC /CK  If you have labs (blood work) drawn today and your tests are completely normal, you will receive your results only by: Marland Kitchen MyChart Message (if you have MyChart) OR . A paper copy in the mail If you have any lab test that is abnormal or we need to change your treatment, we will call you to review the results.   Testing/Procedures:  1. Echocardiogram: Please return to [facility] on ______________ at _______________ AM/PM for an Echocardiogram.  Your physician has requested that you have an echocardiogram. Echocardiography is a painless test that uses sound waves to create images of your heart. It provides your doctor with information about the size and shape of your heart and how well your heart's chambers and valves are working. This procedure takes approximately one hour. There are no restrictions for this procedure. Please note; depending on visual quality an IV may need to be placed.   2. ZIO: A zio monitor was placed today. It will remain on for 14 days. You will then return monitor and event diary in provided box. It takes 1-2 weeks for report to be downloaded and returned to [place]. We will call you with the results. If monitor falls of or has orange flashing light, please call Zio for further instructions.       Follow-Up: At [facility], you and your health needs are our priority.  As part of our continuing mission to provide you with exceptional heart care, we have created designated Provider Care Teams.  These Care Teams include your primary Cardiologist (physician) and Advanced Practice  Providers (APPs -  Physician Assistants and Nurse Practitioners) who all work together to provide you with the care you need, when you need it.  We recommend signing up for the patient portal called "MyChart".  Sign up information is provided on this After Visit Summary.  MyChart is used to connect with patients for Virtual Visits (Telemedicine).  Patients are able to view lab/test results, encounter notes, upcoming appointments, etc.  Non-urgent messages can be sent to your provider as well.   To learn more about what you can do with MyChart, go to [email or web address].    Your next appointment:   2 month(s)  The format for your next appointment:   In Person  Provider:   You may see Lorine Bears, MD or one of the following Advanced Practice Providers on your designated Care Team:     Eula Listen, [place]

## 2020-04-01 LAB — CBC
Hematocrit: 46.3 % (ref 37.5–51.0)
Hemoglobin: 15.8 g/dL (ref 13.0–17.7)
MCH: 29.3 pg (ref 26.6–33.0)
MCHC: 34.1 g/dL (ref 31.5–35.7)
MCV: 86 fL (ref 79–97)
Platelets: 219 10*3/uL (ref 150–450)
RBC: 5.4 x10E6/uL (ref 4.14–5.80)
RDW: 13.2 % (ref 11.6–15.4)
WBC: 6.5 10*3/uL (ref 3.4–10.8)

## 2020-04-01 LAB — LIPID PANEL
Chol/HDL Ratio: 4 ratio (ref 0.0–5.0)
Cholesterol, Total: 193 mg/dL (ref 100–199)
HDL: 48 mg/dL (ref >39)
LDL Chol Calc (NIH): 117 mg/dL — ABNORMAL HIGH (ref 0–99)
Triglycerides: 159 mg/dL — ABNORMAL HIGH (ref 0–149)
VLDL Cholesterol Cal: 28 mg/dL (ref 5–40)

## 2020-04-01 LAB — COMPREHENSIVE METABOLIC PANEL
ALT: 20 IU/L (ref 0–44)
AST: 19 IU/L (ref 0–40)
Albumin/Globulin Ratio: 1.7 (ref 1.2–2.2)
Albumin: 4.6 g/dL (ref 3.8–4.8)
Alkaline Phosphatase: 134 IU/L — ABNORMAL HIGH (ref 44–121)
BUN/Creatinine Ratio: 12 (ref 10–24)
BUN: 11 mg/dL (ref 8–27)
Bilirubin Total: 0.8 mg/dL (ref 0.0–1.2)
CO2: 26 mmol/L (ref 20–29)
Calcium: 8.9 mg/dL (ref 8.6–10.2)
Chloride: 101 mmol/L (ref 96–106)
Creatinine, Ser: 0.9 mg/dL (ref 0.76–1.27)
GFR calc Af Amer: 105 mL/min/{1.73_m2} (ref >59)
GFR calc non Af Amer: 91 mL/min/{1.73_m2} (ref >59)
Globulin, Total: 2.7 g/dL (ref 1.5–4.5)
Glucose: 122 mg/dL — ABNORMAL HIGH (ref 65–99)
Potassium: 3.3 mmol/L — ABNORMAL LOW (ref 3.5–5.2)
Sodium: 141 mmol/L (ref 134–144)
Total Protein: 7.3 g/dL (ref 6.0–8.5)

## 2020-04-01 LAB — CK: Total CK: 165 U/L (ref 41–331)

## 2020-04-02 ENCOUNTER — Telehealth: Payer: Self-pay

## 2020-04-02 DIAGNOSIS — E785 Hyperlipidemia, unspecified: Secondary | ICD-10-CM

## 2020-04-02 MED ORDER — EZETIMIBE 10 MG PO TABS: 10.0000 mg | ORAL_TABLET | Freq: Every day | ORAL | 5 refills | Status: DC

## 2020-04-02 NOTE — Telephone Encounter (Signed)
Spoke with patient and made him aware of lab results. Advised him to eat foods rich in potassium and that his new Rx was sent to his pharmacy. Also advised him to have fasting labs redrawn at the [facility] in 8 weeks. Asked patient if he needed an interpreter however he stated he understood the instructions.   Please have him increase potassium rich foods. Please see prior result note as well prior to calling patient.    Sondra Barges, PA-C  04/01/2020 7:33 AM EDT     Random glucose is high normal.  Renal and liver function are normal.  Potassium is slightly low.  LDL is above goal.  Blood count is normal.  CK normal.   Please add Zetia 10 mg daily. Continue Lipitor. Recheck fasting lipid panel and liver function in 8 weeks.

## 2020-04-16 ENCOUNTER — Other Ambulatory Visit: Payer: Self-pay

## 2020-04-16 ENCOUNTER — Ambulatory Visit (INDEPENDENT_AMBULATORY_CARE_PROVIDER_SITE_OTHER): Payer: BLUE CROSS/BLUE SHIELD

## 2020-04-16 DIAGNOSIS — Z952 Presence of prosthetic heart valve: Secondary | ICD-10-CM

## 2020-04-16 LAB — ECHOCARDIOGRAM COMPLETE
AR max vel: 1.03 cm2
AV Area VTI: 0.84 cm2
AV Area mean vel: 1 cm2
AV Mean grad: 9 mmHg
AV Peak grad: 16.6 mmHg
Ao pk vel: 2.04 m/s
Area-P 1/2: 3.01 cm2
S' Lateral: 2.9 cm

## 2020-04-22 ENCOUNTER — Telehealth: Payer: Self-pay | Admitting: Physician Assistant

## 2020-04-22 NOTE — Telephone Encounter (Signed)
Loa Socks, LPN  [id number] 10:13 AM EST     Mikle Bosworth, Spanish Interpreter that works within [employer], called and left the pt a message to call the office back and request to speak with a triage nurse, to receive echo results per [employer].

## 2020-04-22 NOTE — Telephone Encounter (Signed)
Attempted to reach the patient by phone using [facility] Interpreter ID # [phone]- Tobi Bastos. No answer- I had Tobi Bastos to leave a message that the patient has normal echo results (ok per DPR)  We asked that he all back with any further questions/ concerns.

## 2020-04-22 NOTE — Telephone Encounter (Signed)
Sondra Barges, PA-C  04/17/2020 7:13 AM EST     Echo showed low normal pump function with normal wall motion and normal relaxation of the heart. Aortic valve prosthesis is functioning normally.

## 2020-05-14 ENCOUNTER — Telehealth: Payer: Self-pay | Admitting: *Deleted

## 2020-05-14 NOTE — Telephone Encounter (Signed)
Attempted to call pt to review cardiac monitor results below. No answer. LMOM TCB.

## 2020-05-14 NOTE — Telephone Encounter (Signed)
-----   Message from Sondra Barges, PA-C sent at 05/14/2020  7:07 AM EST ----- Cardiac monitor showed a predominant rhythm of sinus with an average heart rate of 89 bpm.  There were 10 episodes of SVT (a fast heart rhythm coming from the top portion of the heart) with the longest episode lasting 16 beats with an average heart rate of 152 bpm.  There were rare extra beats coming from the top and bottom portions of the heart.  Some of the patient triggered events correlated with PVCs and sinus tachycardia.  Overall reassuring study.  Recommend he continue Lopressor.  If symptoms become more frequent let us know.

## 2020-05-15 NOTE — Telephone Encounter (Signed)
Using spanish interpreter [place] # [phone] left voicemail message to call back for review of results and recommendations.

## 2020-05-15 NOTE — Telephone Encounter (Signed)
Spoke with pt and he asks to call back with interpreter. Called pt back with Spanish interpreter [phone]. Pt did not answer. Interpreter left vm asking pt to return call.

## 2020-05-15 NOTE — Telephone Encounter (Signed)
Patient calling to discuss recent testing results  ° °Please call  ° °

## 2020-05-19 NOTE — Telephone Encounter (Addendum)
We have tried to contact him 3 times using interpreter services. Will send message to provider to see if we should send letter or if we can just review this information at his upcoming appointment in January.

## 2020-05-19 NOTE — Telephone Encounter (Signed)
Using spanish interpreter [place] [phone]   Left voicemail message to please call back for monitor results.

## 2020-05-20 NOTE — Telephone Encounter (Signed)
I recommend sending letter, which will also require translation.  This can also serve as a reminder of his Jan appt.

## 2020-05-20 NOTE — Telephone Encounter (Signed)
Interpreter services will be in next week to translate letter and will then send to patient at that time.

## 2020-05-20 NOTE — Telephone Encounter (Signed)
Contacted translation services to inquire on assistance with letter for patient. They provided email and will get this sent to them.

## 2020-05-27 ENCOUNTER — Encounter: Payer: Self-pay | Admitting: *Deleted

## 2020-05-27 NOTE — Telephone Encounter (Signed)
Translation of letter received from [place] from our system. Placed both Spanish and [place] letter in outgoing mail for patient.

## 2020-06-08 NOTE — Progress Notes (Signed)
Cardiology Office Note    Date:  06/12/2020   ID:  James Yates, DOB [birth year], MRN [id number]  PCP:  Glori Luis, MD  Cardiologist:  Lorine Bears, MD  Electrophysiologist:  None   Chief Complaint: Follow up  History of Present Illness:   James Yates is a 65 y.o. male with history of CAD s/p CABG in 06/2013 following a NSTEMI, bicuspid aortic valve with severe stenosis s/p TAVR in 02/2018, paroxysmal SVT, HTN, and HLD who presents forfollow up of  recent Zio patch.  LHC in 10/2015 showed severe underlying 3-vessel CAD with a patent LIMA-LAD, and SVG-RCA. The SVG-LCx was occluded. The native LCx was occluded proximally with faint collaterals. There was moderate aortic stenosis with a peak to peak gradient of 22 mmHg. This LHC was reviewed with our CTO team with the opinion being the LCx was not optimal for PCI. Echo in 09/2017 showed an EF of 60-65%, normal wall motion, progression of his aortic valve stenosis to severe with a peak velocity of 422 cm/s, mean gradient of 38 mmHg, and apeak gradient of 71 mmHg, left atrium was normal in size, RVSF normal, PASP 46 mmHg. He was seen on 11/23/2017 with worsening exertional chest tightness. He underwent R/LHC on 11/27/2017 that showed significant underlying 3-vessel CAD with a patent LIMA to LAD (could not be selectively engaged), patent SVG to RCA and known occluded SVG to OM3 along with an occluded LCx. There was moderate to severe aortic stenosis with a mean gradient of 26 mmHg and a valve area of 1.2. However, his aortic stenosis was felt to be likely severe based on echo findings and his valve area noted on 11/27/2017 was felt tobe overestimated due to high cardiac output. Valve area of echo noted to be 0.6. The aortic valve morphology was also consistent with severe aortic stenosis. RHC showed mildly elevated filling pressures with mild pulmonary hypertension. He subsequently underwent TAVR in 02/2018. Echo from 02/2019 showed an EF of  60-65%, normal RVSF and ventricular cavity size, normal PASP, and a normal functioning bioprosthetic aortic valve with a mean gradient of 10 mmHg. He was seen in the office in 08/2019 and was doing reasonably well. He did note some continued episodes of substernal chest tightness with overexertion felt to be related to his known occluded SVG to LCx and occluded native LCx. In the context of increased heartburn, Plavix was discontinued.   He was last seen in the office on 03/31/2020 and was doing reasonably well.  He did note intermittent episodes of dizziness associated with palpitations.  In this setting, he underwent Zio patch monitoring which showed a predominant rhythm of sinus with an average heart rate of 89 bpm, IVCD was noted, 10 episodes of SVT with the longest episode lasting 16 beats with an average heart rate of 152 bpm, rare PACs and PVCs.  Some patient triggered events correlated with PVCs and sinus tachycardia.  Echo in 03/2020 showed an EF of 50 to 55%, no regional wall motion abnormalities, normal LV diastolic function parameters, normal RV systolic function and ventricular cavity size, and normal structure and function of aortic valve prosthesis.  He comes in doing well from a cardiac perspective. Since he was last seen he notes no further tachypalpitations or dizziness. No chest pain, dyspnea, presyncope, or syncope. He does continue to note arthralgias which are typically more noticeable in the afternoon hours after he has been active throughout the day. He does not associate these directly with taking any  medications. No falls since he was last seen. He has noted some elevated BP readings at home.   Labs independently reviewed: 03/2020 - Hgb 15.8, PLT 219, BUN 11, serum creatinine 0.9, potassium 3.3, albumin 4.6, AST/ALT normal, TC 193, TG 159, HDL 48, LDL 117 02/2018 - Z6X 6.3 0/9604 - TSH 5.255  Past Medical History:  Diagnosis Date   Coronary artery disease    s/p CABG   Glucose  intolerance (impaired glucose tolerance)    Hyperlipidemia    Hypertension    Obesity    S/P TAVR (transcatheter aortic valve replacement)    26 mm Edwards Sapien 3 transcatheter heart valve placed via percutaneous right transfemoral approach    Severe aortic stenosis     Past Surgical History:  Procedure Laterality Date   CARDIAC CATHETERIZATION  06/2013   [facility]   CARDIAC CATHETERIZATION N/A 10/29/2015   Procedure: Left Heart Cath and Cors/Grafts Angiography;  Surgeon: Iran Ouch, MD;  Location: [facility] INVASIVE CV LAB;  Service: Cardiovascular;  Laterality: N/A;   CORONARY ARTERY BYPASS GRAFT N/A 07/05/2013   Procedure: CORONARY ARTERY BYPASS GRAFTING (CABG);  Surgeon: [name], MD;  Location: [facility] OR;  Service: Open Heart Surgery;  Laterality: N/A;   INTRAOPERATIVE TRANSESOPHAGEAL ECHOCARDIOGRAM N/A 07/05/2013   Procedure: INTRAOPERATIVE TRANSESOPHAGEAL ECHOCARDIOGRAM;  Surgeon: [name], MD;  Location: [facility] OR;  Service: Open Heart Surgery;  Laterality: N/A;   INTRAOPERATIVE TRANSTHORACIC ECHOCARDIOGRAM N/A 03/13/2018   Procedure: INTRAOPERATIVE TRANSTHORACIC ECHOCARDIOGRAM;  Surgeon: Kathleene Hazel, MD;  Location: [facility] OR;  Service: Open Heart Surgery;  Laterality: N/A;   NO PAST SURGERIES     RIGHT/LEFT HEART CATH AND CORONARY ANGIOGRAPHY Bilateral 11/27/2017   Procedure: RIGHT/LEFT HEART CATH AND CORONARY ANGIOGRAPHY;  Surgeon: Iran Ouch, MD;  Location: [facility] INVASIVE CV LAB;  Service: Cardiovascular;  Laterality: Bilateral;   TRANSCATHETER AORTIC VALVE REPLACEMENT, TRANSFEMORAL N/A 03/13/2018   Procedure: TRANSCATHETER AORTIC VALVE REPLACEMENT, TRANSFEMORAL. Edwards Sapien 3 Transcatheter Heart Valve size 82mm.;  Surgeon: Kathleene Hazel, MD;  Location: [facility] OR;  Service: Open Heart Surgery;  Laterality: N/A;    Current Medications: Current Meds  Medication Sig   amLODipine (NORVASC) 5 MG tablet TAKE 1 TABLET BY MOUTH EVERY DAY    amoxicillin (AMOXIL) 500 MG capsule TAKE 4 CAPSULES BY MOUTH ONCE FOR 1 DOSE. TO BE TAKEN 30 60 MIN PRIOR TO DENTAL PROCEDURE.   aspirin EC 81 MG tablet Take 1 tablet (81 mg total) by mouth daily.   metoprolol tartrate (LOPRESSOR) 100 MG tablet Take 1 tablet (100 mg total) by mouth 2 (two) times daily.   [DISCONTINUED] atorvastatin (LIPITOR) 80 MG tablet TAKE 1 TABLET (80 MG TOTAL) BY MOUTH DAILY AT 6 PM.   [DISCONTINUED] ezetimibe (ZETIA) 10 MG tablet Take 1 tablet (10 mg total) by mouth daily.   [DISCONTINUED] metoprolol tartrate (LOPRESSOR) 50 MG tablet TAKE 1 TABLET (50 MG TOTAL) BY MOUTH 2 (TWO) TIMES DAILY. PLEASE SCHEDULE APPOINTMENT FOR FURTHER REFILLS. THANK YOU!    Allergies:   Patient has no known allergies.   Social History   Socioeconomic History   Marital status: Married    Spouse name: Not on file   Number of children: Not on file   Years of education: Not on file   Highest education level: Not on file  Occupational History   Not on file  Tobacco Use   Smoking status: Never Smoker   Smokeless tobacco: Never Used  Vaping Use   Vaping Use: Never  used  Substance and Sexual Activity   Alcohol use: No   Drug use: No   Sexual activity: Not on file  Other Topics Concern   Not on file  Social History Narrative   Not on file   Social Determinants of Health   Financial Resource Strain: Not on file  Food Insecurity: Not on file  Transportation Needs: Not on file  Physical Activity: Not on file  Stress: Not on file  Social Connections: Not on file     Family History:  The patient's family history includes Heart attack in his father; Heart disease in his father.  ROS:   Review of Systems  Constitutional: Negative for chills, diaphoresis, fever, malaise/fatigue and weight loss.  HENT: Negative for congestion.   Eyes: Negative for discharge and redness.  Respiratory: Negative for cough, sputum production, shortness of breath and wheezing.    Cardiovascular: Negative for chest pain, palpitations, orthopnea, claudication, leg swelling and PND.  Gastrointestinal: Negative for abdominal pain, blood in stool, heartburn, melena, nausea and vomiting.  Musculoskeletal: Positive for joint pain. Negative for falls and myalgias.  Skin: Negative for rash.  Neurological: Negative for dizziness, tingling, tremors, sensory change, speech change, focal weakness, loss of consciousness and weakness.  Endo/Heme/Allergies: Does not bruise/bleed easily.  Psychiatric/Behavioral: Negative for substance abuse. The patient is not nervous/anxious.   All other systems reviewed and are negative.    EKGs/Labs/Other Studies Reviewed:    Studies reviewed were summarized above. The additional studies were reviewed today:  Zio patch 03/2020: Normal sinus rhythm with an average heart rate 89 bpm.  IVCD. 10 episodes of supraventricular tachycardia.  The longest lasted 16 beats with an average heart rate of 152 bpm. Rare PACs and rare PVCs. Some triggered events correlated with PVCs and sinus tachycardia. __________  2D echo 03/2020: 1. Left ventricular ejection fraction, by estimation, is 50 to 55%. Left  ventricular ejection fraction by 3D volume is 53 %. The left ventricle has  low normal function. The left ventricle has no regional wall motion  abnormalities. Left ventricular  diastolic parameters were normal.  2. Right ventricular systolic function is normal. The right ventricular  size is normal.  3. The mitral valve is normal in structure. No evidence of mitral valve  regurgitation.  4. The aortic valve has been repaired/replaced. Aortic valve  regurgitation is not visualized. Echo findings are consistent with normal  structure and function of the aortic valve prosthesis.   EKG:  EKG is not ordered today.    Recent Labs: 03/31/2020: ALT 20; BUN 11; Creatinine, Ser 0.90; Hemoglobin 15.8; Platelets 219; Potassium 3.3; Sodium 141  Recent  Lipid Panel    Component Value Date/Time   CHOL 193 03/31/2020 1019   CHOL 215 (H) 07/02/2013 0058   TRIG 159 (H) 03/31/2020 1019   TRIG 143 07/02/2013 0058   HDL 48 03/31/2020 1019   HDL 38 (L) 07/02/2013 0058   CHOLHDL 4.0 03/31/2020 1019   CHOLHDL 3.3 01/08/2018 1552   VLDL 25 01/08/2018 1552   VLDL 29 07/02/2013 0058   LDLCALC 117 (H) 03/31/2020 1019   LDLCALC 148 (H) 07/02/2013 0058    PHYSICAL EXAM:    VS:  BP (!) 150/100 (BP Location: Left Arm, Patient Position: Sitting, Cuff Size: Normal)    Pulse 100    Ht 5\' 5"  (1.651 m)    Wt 197 lb (89.4 kg)    SpO2 98%    BMI 32.78 kg/m   BMI: Body mass index is  32.78 kg/m.  Physical Exam Vitals reviewed.  Constitutional:      Appearance: He is well-developed and well-nourished.  HENT:     Head: Normocephalic and atraumatic.  Eyes:     General:        Right eye: No discharge.        Left eye: No discharge.  Neck:     Vascular: No JVD.  Cardiovascular:     Rate and Rhythm: Normal rate and regular rhythm.     Pulses: No midsystolic click and no opening snap.          Posterior tibial pulses are 2+ on the right side and 2+ on the left side.     Heart sounds: S1 normal and S2 normal. Heart sounds not distant. Murmur heard.   Systolic murmur is present with a grade of 2/6 at the upper right sternal border. No friction rub.  Pulmonary:     Effort: Pulmonary effort is normal. No respiratory distress.     Breath sounds: Normal breath sounds. No decreased breath sounds, wheezing or rales.  Chest:     Chest wall: No tenderness.  Abdominal:     General: There is no distension.     Palpations: Abdomen is soft.     Tenderness: There is no abdominal tenderness.  Musculoskeletal:        General: No edema.     Cervical back: Normal range of motion.  Skin:    General: Skin is warm and dry.     Nails: There is no clubbing or cyanosis.  Neurological:     Mental Status: He is alert and oriented to person, place, and time.   Psychiatric:        Mood and Affect: Mood and affect normal.        Speech: Speech normal.        Behavior: Behavior normal.        Thought Content: Thought content normal.        Judgment: Judgment normal.     Wt Readings from Last 3 Encounters:  06/12/20 197 lb (89.4 kg)  03/31/20 193 lb (87.5 kg)  09/12/19 186 lb (84.4 kg)     ASSESSMENT & PLAN:   1. CAD involving the native coronary arteries without angina: He is doing well without any symptoms concerning for angina. Continue secondary prevention with aspirin, amlodipine, and metoprolol. Atorvastatin and ezetimibe temporarily on hold as outlined below. No indication for ischemic testing at this time.  2. Severe aortic stenosis status post TAVR: Stable on echo in 03/2020.  Continue aspirin. SBE prophylaxis discussed in detail, has amoxicillin.  3. Paroxysmal SVT: He has not noted tachypalpitations since he was last seen. Increase metoprolol to 100 mg twice daily as outlined below given elevated BP.  4. HTN: Blood pressure is mildly elevated in the office today. He does note some high readings at home as well. In this setting we will titrate Lopressor to 100 mg twice daily. He will otherwise continue amlodipine 5 mg daily. Low-sodium diet recommended.  5. HLD: LDL 117 from 03/2020, with goal being less than 70.  In this setting, Zetia was added and he was continued on atorvastatin 80 mg. Trial of holding atorvastatin and ezetimibe as outlined below. It was discussed with him in detail that he will need to be on lipid-lowering therapy long-term with the type dependent upon his trend off atorvastatin and ezetimibe. We will defer rechecking a lipid panel at today's visit given the temporary suspension  of atorvastatin and ezetimibe. This will be revisited in the future following reinitiation of lipid-lowering therapy.  6. Arthralgias: Trial of discontinuing atorvastatin and ezetimibe. We will see him back in 1 month, if arthralgias are  resolved with discontinuing the above medications we will plan to refer him to the lipid clinic for consideration of PCSK9 inhibitor. If arthralgias persist despite discontinuing lipid medications, we will plan to resume them back and recommend he follow-up with his PCP.  7. Language barrier: Hospital medical interpreter used for today's encounter.  Disposition: F/u with me in 1 month.   Medication Adjustments/Labs and Tests Ordered: Current medicines are reviewed at length with the patient today.  Concerns regarding medicines are outlined above. Medication changes, Labs and Tests ordered today are summarized above and listed in the Patient Instructions accessible in Encounters.   Signed, Eula Listen, PA-C 06/12/2020 11:45 AM     [facility] [address] [place], [place] [address] [phone]

## 2020-06-12 ENCOUNTER — Ambulatory Visit: Payer: BLUE CROSS/BLUE SHIELD | Admitting: Physician Assistant

## 2020-06-12 ENCOUNTER — Encounter: Payer: Self-pay | Admitting: Physician Assistant

## 2020-06-12 ENCOUNTER — Other Ambulatory Visit: Payer: Self-pay

## 2020-06-12 VITALS — BP 150/100 | HR 100 | Ht 65.0 in | Wt 197.0 lb

## 2020-06-12 DIAGNOSIS — Z952 Presence of prosthetic heart valve: Secondary | ICD-10-CM

## 2020-06-12 DIAGNOSIS — Z789 Other specified health status: Secondary | ICD-10-CM

## 2020-06-12 DIAGNOSIS — I25810 Atherosclerosis of coronary artery bypass graft(s) without angina pectoris: Secondary | ICD-10-CM

## 2020-06-12 DIAGNOSIS — M2550 Pain in unspecified joint: Secondary | ICD-10-CM

## 2020-06-12 DIAGNOSIS — Z951 Presence of aortocoronary bypass graft: Secondary | ICD-10-CM

## 2020-06-12 DIAGNOSIS — E785 Hyperlipidemia, unspecified: Secondary | ICD-10-CM

## 2020-06-12 DIAGNOSIS — I350 Nonrheumatic aortic (valve) stenosis: Secondary | ICD-10-CM

## 2020-06-12 DIAGNOSIS — I10 Essential (primary) hypertension: Secondary | ICD-10-CM

## 2020-06-12 DIAGNOSIS — I471 Supraventricular tachycardia: Secondary | ICD-10-CM

## 2020-06-12 MED ORDER — METOPROLOL TARTRATE 100 MG PO TABS: 100.0000 mg | ORAL_TABLET | Freq: Two times a day (BID) | ORAL | 3 refills | Status: DC

## 2020-06-12 NOTE — Patient Instructions (Addendum)
Medication Instructions:  Your physician has recommended you make the following change in your medication:  1. INCREASE Metoprolol to 100 mg twice a day 2. STOP Atorvastatin (Lipitor) 3. STOP Ezetimibe (Zetia)   *If you need a refill on your cardiac medications before your next appointment, please call your pharmacy*   Lab Work: None  If you have labs (blood work) drawn today and your tests are completely normal, you will receive your results only by: Marland Kitchen MyChart Message (if you have MyChart) OR . A paper copy in the mail If you have any lab test that is abnormal or we need to change your treatment, we will call you to review the results.   Testing/Procedures: None   Follow-Up: At [facility], you and your health needs are our priority.  As part of our continuing mission to provide you with exceptional heart care, we have created designated Provider Care Teams.  These Care Teams include your primary Cardiologist (physician) and Advanced Practice Providers (APPs -  Physician Assistants and Nurse Practitioners) who all work together to provide you with the care you need, when you need it.  We recommend signing up for the patient portal called "MyChart".  Sign up information is provided on this After Visit Summary.  MyChart is used to connect with patients for Virtual Visits (Telemedicine).  Patients are able to view lab/test results, encounter notes, upcoming appointments, etc.  Non-urgent messages can be sent to your provider as well.   To learn more about what you can do with MyChart, go to [email or web address].    Your next appointment:   1 month(s)  The format for your next appointment:   In Person  Provider:   You may see Lorine Bears, MD or one of the following Advanced Practice Providers on your designated Care Team:    Nicolasa Ducking, NP  Eula Listen, PA-C  Marisue Ivan, PA-C  Cadence [place], [place]  Gillian Shields, NP

## 2020-06-20 ENCOUNTER — Other Ambulatory Visit: Payer: Self-pay | Admitting: Cardiovascular Disease

## 2020-06-23 ENCOUNTER — Other Ambulatory Visit: Payer: Self-pay | Admitting: Family Medicine

## 2020-06-24 ENCOUNTER — Other Ambulatory Visit: Payer: Self-pay | Admitting: Cardiovascular Disease

## 2020-07-11 NOTE — Progress Notes (Signed)
Cardiology Office Note    Date:  07/15/2020   ID:  James Yates, DOB 03/21/1956, MRN [id number]  PCP:  Glori LuisSonnenberg, Eric G, MD  Cardiologist:  Lorine BearsMuhammad Arida, MD  Electrophysiologist:  None   Chief Complaint: Follow up  History of Present Illness:   James Yates is a 65 y.o. male with history of CAD s/p CABG in 06/2013 following a NSTEMI, bicuspid aortic valve with severe stenosiss/p TAVR in 02/2018, paroxysmal SVT, HTN, and HLD who presents forfollow up ofhypertension.  LHC in 10/2015 showed severe underlying 3-vessel CAD with a patent LIMA-LAD, and SVG-RCA. The SVG-LCx was occluded. The native LCx was occluded proximally with faint collaterals. There was moderate aortic stenosis with a peak to peak gradient of 22 mmHg. This LHC was reviewed with our CTO team with the opinion being the LCx was not optimal for PCI. Echo in 09/2017 showed an EF of 60-65%, normal wall motion, progression of his aortic valve stenosis to severe with a peak velocity of 422 cm/s, mean gradient of 38 mmHg, and apeak gradient of 71 mmHg, left atrium was normal in size, RVSF normal, PASP 46 mmHg. He was seen on 11/23/2017 with worsening exertional chest tightness. He underwent R/LHC on 11/27/2017 that showed significant underlying 3-vessel CAD with a patent LIMA to LAD (could not be selectively engaged), patent SVG to RCA and known occluded SVG to OM3 along with an occluded LCx. There was moderate to severe aortic stenosis with a mean gradient of 26 mmHg and a valve area of 1.2. However, his aortic stenosis was felt to be likely severe based on echo findings and his valve area noted on 11/27/2017 was felt tobe overestimated due to high cardiac output. Valve area of echo noted to be 0.6. The aortic valve morphology was also consistent with severe aortic stenosis. RHC showed mildly elevated filling pressures with mild pulmonary hypertension.He subsequently underwent TAVR in 02/2018.Echo from 02/2019 showed an EF of  60-65%, normal RVSF and ventricular cavity size, normal PASP, and a normal functioning bioprosthetic aortic valve with a mean gradient of 10 mmHg. He was seen in the office in 08/2019 and was doing reasonably well. He did note some continued episodes of substernal chest tightness with overexertion felt to be related to his known occluded SVG to LCx and occluded native LCx. In the context of increased heartburn, Plavix was discontinued.  He was seen in the office on 03/31/2020 and was doing reasonably well.  He did note intermittent episodes of dizziness associated with palpitations.  In this setting, he underwent Zio patch monitoring which showed a predominant rhythm of sinus with an average heart rate of 89 bpm, IVCD was noted, 10 episodes of SVT with the longest episode lasting 16 beats with an average heart rate of 152 bpm, rare PACs and PVCs.  Some patient triggered events correlated with PVCs and sinus tachycardia.  Echo in 03/2020 showed an EF of 50 to 55%, no regional wall motion abnormalities, normal LV diastolic function parameters, normal RV systolic function and ventricular cavity size, and normal structure and function of aortic valve prosthesis.  He was last seen in the office on 06/12/2020 and was doing well from a cardiac perspective.  He denied any further tachypalpitations or dizziness.  His BP was elevated at 150/100.  In this setting Lopressor was titrated to 100 mg twice daily and he was continued on amlodipine 5 mg daily.  He comes in today for follow-up of his hypertension.  He comes in doing well  from a cardiac perspective.  Following the discontinuation of atorvastatin and ezetimibe he has noted improvement in his arthralgias.  He does continue to note low back pain which is a longstanding issue.  He also notes bilateral upper extremity paresthesias involving the hands which is a longstanding issue.  He reports he previously took potassium supplement for this with resolution of symptoms.  He  asks that we check this today.  He denies any chest pain, dyspnea, palpitations, presyncope, or syncope.   Labs independently reviewed: 03/2020 - Hgb 15.8, PLT 219, BUN 11, serum creatinine 0.9, potassium 3.3, albumin 4.6, AST/ALT normal, TC 193, TG 159, HDL 48, LDL 117 02/2018 - C5Y 6.3 12/5025 - TSH 5.255  Past Medical History:  Diagnosis Date  . Coronary artery disease    s/p CABG  . Glucose intolerance (impaired glucose tolerance)   . Hyperlipidemia   . Hypertension   . Obesity   . S/P TAVR (transcatheter aortic valve replacement)    26 mm Edwards Sapien 3 transcatheter heart valve placed via percutaneous right transfemoral approach   . Severe aortic stenosis     Past Surgical History:  Procedure Laterality Date  . CARDIAC CATHETERIZATION  06/2013   [facility]  . CARDIAC CATHETERIZATION N/A 10/29/2015   Procedure: Left Heart Cath and Cors/Grafts Angiography;  Surgeon: Iran Ouch, MD;  Location: [facility] INVASIVE CV LAB;  Service: Cardiovascular;  Laterality: N/A;  . CORONARY ARTERY BYPASS GRAFT N/A 07/05/2013   Procedure: CORONARY ARTERY BYPASS GRAFTING (CABG);  Surgeon: [name], MD;  Location: [facility] OR;  Service: Open Heart Surgery;  Laterality: N/A;  . INTRAOPERATIVE TRANSESOPHAGEAL ECHOCARDIOGRAM N/A 07/05/2013   Procedure: INTRAOPERATIVE TRANSESOPHAGEAL ECHOCARDIOGRAM;  Surgeon: [name], MD;  Location: [facility] OR;  Service: Open Heart Surgery;  Laterality: N/A;  . INTRAOPERATIVE TRANSTHORACIC ECHOCARDIOGRAM N/A 03/13/2018   Procedure: INTRAOPERATIVE TRANSTHORACIC ECHOCARDIOGRAM;  Surgeon: Kathleene Hazel, MD;  Location: [facility] OR;  Service: Open Heart Surgery;  Laterality: N/A;  . NO PAST SURGERIES    . RIGHT/LEFT HEART CATH AND CORONARY ANGIOGRAPHY Bilateral 11/27/2017   Procedure: RIGHT/LEFT HEART CATH AND CORONARY ANGIOGRAPHY;  Surgeon: Iran Ouch, MD;  Location: [facility] INVASIVE CV LAB;  Service: Cardiovascular;  Laterality: Bilateral;  . TRANSCATHETER AORTIC VALVE  REPLACEMENT, TRANSFEMORAL N/A 03/13/2018   Procedure: TRANSCATHETER AORTIC VALVE REPLACEMENT, TRANSFEMORAL. Edwards Sapien 3 Transcatheter Heart Valve size 28mm.;  Surgeon: Kathleene Hazel, MD;  Location: [facility] OR;  Service: Open Heart Surgery;  Laterality: N/A;    Current Medications: Current Meds  Medication Sig  . amoxicillin (AMOXIL) 500 MG capsule TAKE 4 CAPSULES BY MOUTH ONCE FOR 1 DOSE. TO BE TAKEN 30 60 MIN PRIOR TO DENTAL PROCEDURE.  Marland Kitchen aspirin EC 81 MG tablet Take 1 tablet (81 mg total) by mouth daily.  . metoprolol tartrate (LOPRESSOR) 100 MG tablet Take 1 tablet (100 mg total) by mouth 2 (two) times daily.  . rosuvastatin (CRESTOR) 5 MG tablet Take 1 tablet (5 mg total) by mouth daily.  . [DISCONTINUED] amLODipine (NORVASC) 5 MG tablet TAKE 1 TABLET BY MOUTH EVERY DAY    Allergies:   Patient has no known allergies.   Social History   Socioeconomic History  . Marital status: Married    Spouse name: Not on file  . Number of children: Not on file  . Years of education: Not on file  . Highest education level: Not on file  Occupational History  . Not on file  Tobacco Use  . Smoking status:  Never Smoker  . Smokeless tobacco: Never Used  Vaping Use  . Vaping Use: Never used  Substance and Sexual Activity  . Alcohol use: No  . Drug use: No  . Sexual activity: Not on file  Other Topics Concern  . Not on file  Social History Narrative  . Not on file   Social Determinants of Health   Financial Resource Strain: Not on file  Food Insecurity: Not on file  Transportation Needs: Not on file  Physical Activity: Not on file  Stress: Not on file  Social Connections: Not on file     Family History:  The patient's family history includes Heart attack in his father; Heart disease in his father.  ROS:   Review of Systems  Constitutional: Negative for chills, diaphoresis, fever, malaise/fatigue and weight loss.  HENT: Negative for congestion.   Eyes: Negative for  discharge and redness.  Respiratory: Negative for cough, sputum production, shortness of breath and wheezing.   Cardiovascular: Negative for chest pain, palpitations, orthopnea, claudication, leg swelling and PND.  Gastrointestinal: Negative for abdominal pain, heartburn, nausea and vomiting.  Musculoskeletal: Positive for back pain and joint pain. Negative for falls and myalgias.  Skin: Negative for rash.  Neurological: Positive for sensory change. Negative for dizziness, tingling, tremors, speech change, focal weakness, loss of consciousness and weakness.  Endo/Heme/Allergies: Does not bruise/bleed easily.  Psychiatric/Behavioral: Negative for substance abuse. The patient is not nervous/anxious.   All other systems reviewed and are negative.    EKGs/Labs/Other Studies Reviewed:    Studies reviewed were summarized above. The additional studies were reviewed today:  Zio patch 03/2020: Normal sinus rhythm with an average heart rate 89 bpm. IVCD. 10 episodes of supraventricular tachycardia. The longest lasted 16 beats with an average heart rate of 152 bpm. Rare PACs and rare PVCs. Some triggered events correlated with PVCs and sinus tachycardia. __________  2D echo 03/2020: 1. Left ventricular ejection fraction, by estimation, is 50 to 55%. Left  ventricular ejection fraction by 3D volume is 53 %. The left ventricle has  low normal function. The left ventricle has no regional wall motion  abnormalities. Left ventricular  diastolic parameters were normal.  2. Right ventricular systolic function is normal. The right ventricular  size is normal.  3. The mitral valve is normal in structure. No evidence of mitral valve  regurgitation.  4. The aortic valve has been repaired/replaced. Aortic valve  regurgitation is not visualized. Echo findings are consistent with normal  structure and function of the aortic valve prosthesis.   EKG:  EKG is not ordered today.    Recent  Labs: 03/31/2020: ALT 20; BUN 11; Creatinine, Ser 0.90; Hemoglobin 15.8; Platelets 219; Potassium 3.3; Sodium 141  Recent Lipid Panel    Component Value Date/Time   CHOL 193 03/31/2020 1019   CHOL 215 (H) 07/02/2013 0058   TRIG 159 (H) 03/31/2020 1019   TRIG 143 07/02/2013 0058   HDL 48 03/31/2020 1019   HDL 38 (L) 07/02/2013 0058   CHOLHDL 4.0 03/31/2020 1019   CHOLHDL 3.3 01/08/2018 1552   VLDL 25 01/08/2018 1552   VLDL 29 07/02/2013 0058   LDLCALC 117 (H) 03/31/2020 1019   LDLCALC 148 (H) 07/02/2013 0058    PHYSICAL EXAM:    VS:  BP (!) 146/92   Pulse 77   Ht 5\' 5"  (1.651 m)   Wt 199 lb (90.3 kg)   BMI 33.12 kg/m   BMI: Body mass index is 33.12 kg/m.  Physical  Exam Vitals reviewed.  Constitutional:      Appearance: He is well-developed and well-nourished.  HENT:     Head: Normocephalic and atraumatic.  Eyes:     General:        Right eye: No discharge.        Left eye: No discharge.  Neck:     Vascular: No JVD.  Cardiovascular:     Rate and Rhythm: Normal rate and regular rhythm.     Pulses: No midsystolic click and no opening snap.          Posterior tibial pulses are 2+ on the right side and 2+ on the left side.     Heart sounds: S1 normal and S2 normal. Heart sounds not distant. Murmur heard.   Systolic murmur is present with a grade of 2/6 at the upper right sternal border. No friction rub.  Pulmonary:     Effort: Pulmonary effort is normal. No respiratory distress.     Breath sounds: Normal breath sounds. No decreased breath sounds, wheezing or rales.  Chest:     Chest wall: No tenderness.  Abdominal:     General: There is no distension.     Palpations: Abdomen is soft.     Tenderness: There is no abdominal tenderness.  Musculoskeletal:        General: No edema.     Cervical back: Normal range of motion.  Skin:    General: Skin is warm and dry.     Nails: There is no clubbing or cyanosis.  Neurological:     Mental Status: He is alert and oriented  to person, place, and time.  Psychiatric:        Mood and Affect: Mood and affect normal.        Speech: Speech normal.        Behavior: Behavior normal.        Thought Content: Thought content normal.        Judgment: Judgment normal.     Wt Readings from Last 3 Encounters:  07/15/20 199 lb (90.3 kg)  06/12/20 197 lb (89.4 kg)  03/31/20 193 lb (87.5 kg)     ASSESSMENT & PLAN:   1. CAD involving the native coronary arteries without angina: He is doing well without any symptoms concerning for angina.  Continue current medical therapy and secondary prevention with aspirin, amlodipine, metoprolol, and newly added rosuvastatin as outlined below.  No indication for ischemic testing at this time.  2. Severe aortic stenosis status post TAVR: Stable on echo in 03/2020.  Continue aspirin.  SBE prophylaxis with amoxicillin.  3. Paroxysmal SVT: Quiescent.  He remains on Lopressor 100 mg twice daily as outlined below.  4. HTN: Blood pressure remains mildly elevated today.  Titrate amlodipine to 10 mg daily.  Otherwise, he remains on Lopressor 100 mg twice daily.  If his BP continues to be elevated we could consider transitioning him from Lopressor to carvedilol.  Recommend low-sodium diet.  5. HLD: LDL 117 from 03/2021 with goal being less than 70.  Arthralgias significantly improved following discontinuation of atorvastatin and ezetimibe.  Trial of low-dose Crestor 5 mg daily.  If he tolerates this well we will plan to escalate this as indicated to achieve a goal LDL of less than 70.  If he does not tolerate rosuvastatin we will refer him to the lipid clinic for consideration of PCSK9 inhibitor in follow-up.  6. Arthralgias: Significantly improved following discontinuation of atorvastatin and Zetia.  We will  undergo a trial of rosuvastatin as outlined above.  He will contact us if this exacerbates his arthralgias.  7. Low back pain: Longstanding issue.  Recommend he discuss this with his  PCP.  8. Bilateral upper extremity paresthesias: Longstanding issue which was previously treated with potassium per his report.  Check BMP and magnesium.  If these are unrevealing recommend he follow-up with PCP.  9. Language barrier: Hospital medical interpreter utilized for today's visit.  Disposition: F/u with Dr. Kirke Corin or an APP in 2 months.   Medication Adjustments/Labs and Tests Ordered: Current medicines are reviewed at length with the patient today.  Concerns regarding medicines are outlined above. Medication changes, Labs and Tests ordered today are summarized above and listed in the Patient Instructions accessible in Encounters.   Signed, Eula Listen, PA-C 07/15/2020 10:43 AM     [facility] [place] [address] [place], [place] [address] [phone]

## 2020-07-15 ENCOUNTER — Ambulatory Visit: Payer: BLUE CROSS/BLUE SHIELD | Admitting: Physician Assistant

## 2020-07-15 ENCOUNTER — Other Ambulatory Visit: Payer: Self-pay

## 2020-07-15 ENCOUNTER — Encounter: Payer: Self-pay | Admitting: Physician Assistant

## 2020-07-15 VITALS — BP 146/92 | HR 77 | Ht 65.0 in | Wt 199.0 lb

## 2020-07-15 DIAGNOSIS — I350 Nonrheumatic aortic (valve) stenosis: Secondary | ICD-10-CM

## 2020-07-15 DIAGNOSIS — Z789 Other specified health status: Secondary | ICD-10-CM

## 2020-07-15 DIAGNOSIS — Z951 Presence of aortocoronary bypass graft: Secondary | ICD-10-CM

## 2020-07-15 DIAGNOSIS — E785 Hyperlipidemia, unspecified: Secondary | ICD-10-CM

## 2020-07-15 DIAGNOSIS — I471 Supraventricular tachycardia: Secondary | ICD-10-CM

## 2020-07-15 DIAGNOSIS — I25810 Atherosclerosis of coronary artery bypass graft(s) without angina pectoris: Secondary | ICD-10-CM | POA: Diagnosis not present

## 2020-07-15 DIAGNOSIS — Z952 Presence of prosthetic heart valve: Secondary | ICD-10-CM

## 2020-07-15 DIAGNOSIS — I10 Essential (primary) hypertension: Secondary | ICD-10-CM

## 2020-07-15 DIAGNOSIS — M2550 Pain in unspecified joint: Secondary | ICD-10-CM

## 2020-07-15 MED ORDER — ROSUVASTATIN CALCIUM 5 MG PO TABS: 5.0000 mg | ORAL_TABLET | Freq: Every day | ORAL | 1 refills | Status: DC

## 2020-07-15 MED ORDER — AMLODIPINE BESYLATE 10 MG PO TABS: 10.0000 mg | ORAL_TABLET | Freq: Every day | ORAL | 1 refills | Status: DC

## 2020-07-15 NOTE — Patient Instructions (Signed)
Medication Instructions:  Your physician has recommended you make the following change in your medication:   1) INCREASE Amlodipine to 10 mg daily. An Rx has been sent to your pharmacy.  2) START Rosuvastatin (Crestor) 5 mg daily. An Rx has been sent to your pharmacy.  *If you need a refill on your cardiac medications before your next appointment, please call your pharmacy*   Lab Work: BMP and Mag today If you have labs (blood work) drawn today and your tests are completely normal, you will receive your results only by: Marland Kitchen MyChart Message (if you have MyChart) OR . A paper copy in the mail If you have any lab test that is abnormal or we need to change your treatment, we will call you to review the results.   Testing/Procedures: None ordered   Follow-Up: At [facility], you and your health needs are our priority.  As part of our continuing mission to provide you with exceptional heart care, we have created designated Provider Care Teams.  These Care Teams include your primary Cardiologist (physician) and Advanced Practice Providers (APPs -  Physician Assistants and Nurse Practitioners) who all work together to provide you with the care you need, when you need it.  We recommend signing up for the patient portal called "MyChart".  Sign up information is provided on this After Visit Summary.  MyChart is used to connect with patients for Virtual Visits (Telemedicine).  Patients are able to view lab/test results, encounter notes, upcoming appointments, etc.  Non-urgent messages can be sent to your provider as well.   To learn more about what you can do with MyChart, go to [email or web address].    Your next appointment:   2 month(s)  The format for your next appointment:   In Person  Provider:   Eula Listen, PA-C   Other Instructions N/A

## 2020-07-16 LAB — BASIC METABOLIC PANEL
BUN/Creatinine Ratio: 9 — ABNORMAL LOW (ref 10–24)
BUN: 9 mg/dL (ref 8–27)
CO2: 23 mmol/L (ref 20–29)
Calcium: 9.2 mg/dL (ref 8.6–10.2)
Chloride: 102 mmol/L (ref 96–106)
Creatinine, Ser: 0.97 mg/dL (ref 0.76–1.27)
GFR calc Af Amer: 95 mL/min/{1.73_m2} (ref >59)
GFR calc non Af Amer: 82 mL/min/{1.73_m2} (ref >59)
Glucose: 127 mg/dL — ABNORMAL HIGH (ref 65–99)
Potassium: 3.9 mmol/L (ref 3.5–5.2)
Sodium: 141 mmol/L (ref 134–144)

## 2020-07-16 LAB — MAGNESIUM: Magnesium: 2 mg/dL (ref 1.6–2.3)

## 2020-08-06 ENCOUNTER — Other Ambulatory Visit: Payer: Self-pay | Admitting: Physician Assistant

## 2020-09-05 ENCOUNTER — Other Ambulatory Visit: Payer: Self-pay | Admitting: Physician Assistant

## 2020-09-17 ENCOUNTER — Encounter: Payer: Self-pay | Admitting: Cardiovascular Disease

## 2020-09-17 ENCOUNTER — Other Ambulatory Visit: Payer: Self-pay

## 2020-09-17 ENCOUNTER — Other Ambulatory Visit
Admission: RE | Admit: 2020-09-17 | Discharge: 2020-09-17 | Disposition: A | Discharge status: Home / Self Care | Payer: BLUE CROSS/BLUE SHIELD | Source: Ambulatory Visit | Attending: Cardiovascular Disease | Admitting: Cardiovascular Disease

## 2020-09-17 ENCOUNTER — Telehealth: Payer: Self-pay

## 2020-09-17 ENCOUNTER — Ambulatory Visit: Payer: BLUE CROSS/BLUE SHIELD | Admitting: Cardiovascular Disease

## 2020-09-17 VITALS — BP 150/98 | HR 112 | Ht 65.0 in | Wt 206.2 lb

## 2020-09-17 DIAGNOSIS — Z952 Presence of prosthetic heart valve: Secondary | ICD-10-CM | POA: Diagnosis not present

## 2020-09-17 DIAGNOSIS — I10 Essential (primary) hypertension: Secondary | ICD-10-CM | POA: Insufficient documentation

## 2020-09-17 DIAGNOSIS — E785 Hyperlipidemia, unspecified: Secondary | ICD-10-CM | POA: Insufficient documentation

## 2020-09-17 DIAGNOSIS — I2510 Atherosclerotic heart disease of native coronary artery without angina pectoris: Secondary | ICD-10-CM

## 2020-09-17 LAB — HEPATIC FUNCTION PANEL
ALT: 19 U/L (ref 0–44)
AST: 21 U/L (ref 15–41)
Albumin: 4.3 g/dL (ref 3.5–5.0)
Alkaline Phosphatase: 88 U/L (ref 38–126)
Bilirubin, Direct: 0.2 mg/dL (ref 0.0–0.2)
Indirect Bilirubin: 0.9 mg/dL (ref 0.3–0.9)
Total Bilirubin: 1.1 mg/dL (ref 0.3–1.2)
Total Protein: 7.3 g/dL (ref 6.5–8.1)

## 2020-09-17 LAB — LIPID PANEL
Cholesterol: 160 mg/dL (ref 0–200)
HDL: 45 mg/dL (ref >40)
LDL Cholesterol: 83 mg/dL (ref 0–99)
Total CHOL/HDL Ratio: 3.6 RATIO
Triglycerides: 158 mg/dL — ABNORMAL HIGH (ref <150)
VLDL: 32 mg/dL (ref 0–40)

## 2020-09-17 NOTE — Patient Instructions (Signed)
Medication Instructions:  Your physician recommends that you continue on your current medications as directed. Please refer to the Current Medication list given to you today.  *If you need a refill on your cardiac medications before your next appointment, please call your pharmacy*   Lab Work: Lipid and Hepatic panel today.  Please have your labs drawn at the medical mall  If you have labs (blood work) drawn today and your tests are completely normal, you will receive your results only by: Marland Kitchen MyChart Message (if you have MyChart) OR . A paper copy in the mail If you have any lab test that is abnormal or we need to change your treatment, we will call you to review the results.   Testing/Procedures: None ordered   Follow-Up: At [facility], you and your health needs are our priority.  As part of our continuing mission to provide you with exceptional heart care, we have created designated Provider Care Teams.  These Care Teams include your primary Cardiologist (physician) and Advanced Practice Providers (APPs -  Physician Assistants and Nurse Practitioners) who all work together to provide you with the care you need, when you need it.  We recommend signing up for the patient portal called "MyChart".  Sign up information is provided on this After Visit Summary.  MyChart is used to connect with patients for Virtual Visits (Telemedicine).  Patients are able to view lab/test results, encounter notes, upcoming appointments, etc.  Non-urgent messages can be sent to your provider as well.   To learn more about what you can do with MyChart, go to [email or web address].    Your next appointment:   Your physician wants you to follow-up in: 6 months You will receive a reminder letter in the mail two months in advance. If you don't receive a letter, please call our office to schedule the follow-up appointment.   The format for your next appointment:   In Person  Provider:   You may see  Lorine Bears, MD or one of the following Advanced Practice Providers on your designated Care Team:    Nicolasa Ducking, NP  Eula Listen, PA-C  Marisue Ivan, PA-C  [place], [place]  Gillian Shields, NP    Other Instructions N/A

## 2020-09-17 NOTE — Telephone Encounter (Signed)
-----   Message from Iran Ouch, MD sent at 09/17/2020 11:49 AM EDT ----- Inform patient that labs were normal.  His cholesterol improved with Crestor but still not at target.  Recommend increasing rosuvastatin to 10 mg daily.

## 2020-09-17 NOTE — Progress Notes (Signed)
Cardiology Office Note   Date:  09/17/2020   ID:  James Yates, DOB November 19, 1955, MRN [id number]  PCP: None Cardiologist:   Lorine Bears, MD   Chief Complaint  Patient presents with  . 2 month follow up     Patient c/o shortness of breath, chest pain that radiates into his back, right arm pain and terrible headache.Medications reviewed by the patient verbally.        History of Present Illness: James Yates is a 65 y.o. male who presents for a followup visit regarding coronary artery disease status post CABG in February of 2015 after NSTEMI and severe aortic stenosis due to bicuspid aortic valve status post TAVR in October 2019. Other medical problems include hypertension, hyperlipidemia and RBBB . Cardiac catheterization in July 2019 showed significant underlying three-vessel coronary artery disease with patent LIMA to LAD, patent SVG to RCA and known chronically occluded SVG to OM 3 with occluded native left circumflex.  He had a ZIO monitor done in November of last year which showed 10 episodes of SVT.  Echocardiogram also November showed an EF of 50 to 55% with normal functioning TAVR prosthesis.  The dose of metoprolol was increased to 100 mg twice daily in January due to elevated blood pressure.  Atorvastatin and Zetia were discontinued due to arthralgia.  Small dose rosuvastatin was initiated instead.  The dose of amlodipine was increased in February to 10 mg daily.  He has been doing reasonably well.  He reports stable chest pain.  Usually he gets a brief episode once a week or every 2 weeks.  He is dealing with increased back pain and some right and shoulder discomfort.  He was having a headache when he came and but he reports improvement now.  He has not taken his blood pressure medications this morning yet.  Past Medical History:  Diagnosis Date  . Coronary artery disease    s/p CABG  . Glucose intolerance (impaired glucose tolerance)   . Hyperlipidemia   .  Hypertension   . Obesity   . S/P TAVR (transcatheter aortic valve replacement)    26 mm Edwards Sapien 3 transcatheter heart valve placed via percutaneous right transfemoral approach   . Severe aortic stenosis     Past Surgical History:  Procedure Laterality Date  . CARDIAC CATHETERIZATION  06/2013   [facility]  . CARDIAC CATHETERIZATION N/A 10/29/2015   Procedure: Left Heart Cath and Cors/Grafts Angiography;  Surgeon: Iran Ouch, MD;  Location: [facility] INVASIVE CV LAB;  Service: Cardiovascular;  Laterality: N/A;  . CORONARY ARTERY BYPASS GRAFT N/A 07/05/2013   Procedure: CORONARY ARTERY BYPASS GRAFTING (CABG);  Surgeon: [name], MD;  Location: [facility] OR;  Service: Open Heart Surgery;  Laterality: N/A;  . INTRAOPERATIVE TRANSESOPHAGEAL ECHOCARDIOGRAM N/A 07/05/2013   Procedure: INTRAOPERATIVE TRANSESOPHAGEAL ECHOCARDIOGRAM;  Surgeon: [name], MD;  Location: [facility] OR;  Service: Open Heart Surgery;  Laterality: N/A;  . INTRAOPERATIVE TRANSTHORACIC ECHOCARDIOGRAM N/A 03/13/2018   Procedure: INTRAOPERATIVE TRANSTHORACIC ECHOCARDIOGRAM;  Surgeon: Kathleene Hazel, MD;  Location: [facility] OR;  Service: Open Heart Surgery;  Laterality: N/A;  . NO PAST SURGERIES    . RIGHT/LEFT HEART CATH AND CORONARY ANGIOGRAPHY Bilateral 11/27/2017   Procedure: RIGHT/LEFT HEART CATH AND CORONARY ANGIOGRAPHY;  Surgeon: Iran Ouch, MD;  Location: [facility] INVASIVE CV LAB;  Service: Cardiovascular;  Laterality: Bilateral;  . TRANSCATHETER AORTIC VALVE REPLACEMENT, TRANSFEMORAL N/A 03/13/2018   Procedure: TRANSCATHETER AORTIC VALVE REPLACEMENT, TRANSFEMORAL. Edwards Sapien 3 Transcatheter Heart Valve  size 12mm.;  Surgeon: Kathleene Hazel, MD;  Location: [facility] OR;  Service: Open Heart Surgery;  Laterality: N/A;     Current Outpatient Medications  Medication Sig Dispense Refill  . amLODipine (NORVASC) 10 MG tablet Take 1 tablet (10 mg total) by mouth daily. 90 tablet 1  . amoxicillin (AMOXIL) 500 MG capsule  TAKE 4 CAPSULES BY MOUTH ONCE FOR 1 DOSE. TO BE TAKEN 30 60 MIN PRIOR TO DENTAL PROCEDURE.    Marland Kitchen aspirin EC 81 MG tablet Take 1 tablet (81 mg total) by mouth daily. 30 tablet 3  . metoprolol tartrate (LOPRESSOR) 100 MG tablet Take 1 tablet (100 mg total) by mouth 2 (two) times daily. 180 tablet 3  . rosuvastatin (CRESTOR) 5 MG tablet TAKE 1 TABLET (5 MG TOTAL) BY MOUTH DAILY. 30 tablet 1   No current facility-administered medications for this visit.    Allergies:   Patient has no known allergies.    Social History:  The patient  reports that he has never smoked. He has never used smokeless tobacco. He reports that he does not drink alcohol and does not use drugs.   Family History:  The patient's family history includes Heart attack in his father; Heart disease in his father.    ROS:  Please see the history of present illness.   Otherwise, review of systems are positive for none.   All other systems are reviewed and negative.    PHYSICAL EXAM: VS:  BP (!) 150/98 (BP Location: Left Arm, Patient Position: Sitting, Cuff Size: Normal)   Pulse (!) 112   Ht 5\' 5"  (1.651 m)   Wt 206 lb 4 oz (93.6 kg)   SpO2 98%   BMI 34.32 kg/m  , BMI Body mass index is 34.32 kg/m. GEN: Well nourished, well developed, in no acute distress  HEENT: normal  Neck: no JVD, carotid bruits, or masses Cardiac: RRR; no  rubs, or gallops,no edema . There is a 2/6 systolic aortic murmur which is a early peaking. Respiratory:  clear to auscultation bilaterally, normal work of breathing GI: soft, nontender, nondistended, + BS MS: no deformity or atrophy  Skin: warm and dry, no rash Neuro:  Strength and sensation are intact Psych: euthymic mood, full affect   EKG:  EKG is  ordered today. EKG done showed sinus tachycardia with right bundle branch block and old inferior infarct.  Recent Labs: 03/31/2020: ALT 20; Hemoglobin 15.8; Platelets 219 07/15/2020: BUN 9; Creatinine, Ser 0.97; Magnesium 2.0; Potassium 3.9;  Sodium 141    Lipid Panel    Component Value Date/Time   CHOL 193 03/31/2020 1019   CHOL 215 (H) 07/02/2013 0058   TRIG 159 (H) 03/31/2020 1019   TRIG 143 07/02/2013 0058   HDL 48 03/31/2020 1019   HDL 38 (L) 07/02/2013 0058   CHOLHDL 4.0 03/31/2020 1019   CHOLHDL 3.3 01/08/2018 1552   VLDL 25 01/08/2018 1552   VLDL 29 07/02/2013 0058   LDLCALC 117 (H) 03/31/2020 1019   LDLCALC 148 (H) 07/02/2013 0058      Wt Readings from Last 3 Encounters:  09/17/20 206 lb 4 oz (93.6 kg)  07/15/20 199 lb (90.3 kg)  06/12/20 197 lb (89.4 kg)        ASSESSMENT AND PLAN:  1.  Status post TAVR for severe aortic stenosis: He is doing well overall with stable symptoms.  [place] heart association class II. Continue with antibiotic prophylaxis before dental procedures.  Most recent echo in November showed  normal functioning TAVR prosthesis.  Continue aspirin indefinitely.  2. Coronary artery disease involving native coronary arteries with mild angina: Continue antianginal therapy with amlodipine and metoprolol.  3. Essential hypertension: Blood pressure is elevated today but he has not taken his antihypertensive medications yet.  Normally he takes morning medications around 8:00.  I discussed with him the importance of taking medications on a timely fashion.  4. Hyperlipidemia: He is tolerating small dose rosuvastatin.  I requested a follow-up lipid and liver profile.  5.  Back pain/right arm and shoulder pain: I advised him to follow-up with his primary care physician given that his symptoms persisted for more than a month.    Disposition:   FU with me in 6 months  Signed,  Lorine Bears, MD  09/17/2020 9:00 AM    [facility]

## 2020-09-17 NOTE — Telephone Encounter (Signed)
Called the patient using interpreter ID [phone]. Unable to lmom. Patients voicemail is full.

## 2020-09-22 ENCOUNTER — Other Ambulatory Visit: Payer: Self-pay | Admitting: Physician Assistant

## 2020-09-22 MED ORDER — ROSUVASTATIN CALCIUM 10 MG PO TABS: 10.0000 mg | ORAL_TABLET | Freq: Every day | ORAL | 2 refills | Status: DC

## 2020-09-22 NOTE — Telephone Encounter (Signed)
-----   Message from Muhammad A Arida, MD sent at 09/17/2020 11:49 AM EDT ----- Inform patient that labs were normal.  His cholesterol improved with Crestor but still not at target.  Recommend increasing rosuvastatin to 10 mg daily.  

## 2020-09-22 NOTE — Telephone Encounter (Signed)
Contacted the patient using pacific interpreter ID # [id number]. Patient made aware of lab results and Dr. Jari Sportsman recommendation. Rx for rosuvastatin 10 mg daily has been sent to the patients pharmacy. Patient has no questions or concerns at this time.

## 2020-09-24 ENCOUNTER — Ambulatory Visit
Admission: EM | Admit: 2020-09-24 | Discharge: 2020-09-24 | Disposition: A | Discharge status: Home / Self Care | Payer: BLUE CROSS/BLUE SHIELD | Attending: Emergency Medicine | Admitting: Emergency Medicine

## 2020-09-24 DIAGNOSIS — M549 Dorsalgia, unspecified: Secondary | ICD-10-CM

## 2020-09-24 MED ORDER — IBUPROFEN 600 MG PO TABS: 600.0000 mg | ORAL_TABLET | Freq: Four times a day (QID) | ORAL | 0 refills | Status: DC | PRN

## 2020-09-24 MED ORDER — METHOCARBAMOL 500 MG PO TABS: 500.0000 mg | ORAL_TABLET | Freq: Two times a day (BID) | ORAL | 0 refills | Status: DC | PRN

## 2020-09-24 NOTE — Discharge Instructions (Signed)
Take ibuprofen as needed for discomfort.  Take the muscle relaxer as needed for muscle spasm; Do not drive, operate machinery, or drink alcohol with this medication as it can cause drowsiness.   Follow up with your primary care provider or a neurosurgeon such as the one listed below.

## 2020-09-24 NOTE — ED Triage Notes (Signed)
Pt presents with complaints of pain in his arms, upper back and lower back x "a long time". Reports he went to [place] 2 weeks ago and had an MRI done. Reports the mri showed he has "discs that are not right.". Reports his PCP sent him here due to the pain. Pt reports he does not have any pain medication at home.

## 2020-09-24 NOTE — ED Provider Notes (Signed)
[facility]    CSN: [id number] Arrival date & time: 09/24/20  1552      History   Chief Complaint Chief Complaint  Patient presents with  . Back Pain    HPI James Yates is a [age] y.o. male.   Patient presents with pain in his upper and lower back for > 1 year.  No falls or injury.  He reports intermittent numbness, weakness, and tingling in his hands and feet.  He denies loss of bowel/bladder control, saddle anesthesia, abdominal pain, dysuria, fever, or other symptoms.  No treatments attempted at home.  Patient states he had an MRI done in [place] 2 weeks ago which showed "disc that are not right."  His medical history includes CAD, CABG, aortic valve replacement, hyperlipidemia, hypertension, obesity, chronic knee pain, chronic shoulder pain, depression.  The history is provided by the patient and medical records. A language interpreter was used.    Past Medical History:  Diagnosis Date  . Coronary artery disease    s/p CABG  . Glucose intolerance (impaired glucose tolerance)   . Hyperlipidemia   . Hypertension   . Obesity   . S/P TAVR (transcatheter aortic valve replacement)    26 mm Edwards Sapien 3 transcatheter heart valve placed via percutaneous right transfemoral approach   . Severe aortic stenosis     Patient Active Problem List   Diagnosis Date Noted  . Arthralgia 07/10/2019  . Bruising 07/10/2019  . Colon cancer screening 07/10/2019  . Chronic pain of both knees 05/29/2019  . Right hand pain 05/29/2019  . Chronic right shoulder pain 05/29/2019  . Depression, major, single episode, moderate (HCC) 05/29/2019  . Severe aortic stenosis   . Obesity   . Glucose intolerance (impaired glucose tolerance)   . S/P TAVR (transcatheter aortic valve replacement)   . Coronary artery disease involving autologous vein coronary bypass graft with angina pectoris (HCC) 01/14/2015  . HTN, goal below 140/90 01/14/2015  . Pure hypercholesterolemia 01/14/2015  .  Hyperlipidemia   . S/P CABG x 4 07/05/2013    Past Surgical History:  Procedure Laterality Date  . CARDIAC CATHETERIZATION  06/2013   [facility]  . CARDIAC CATHETERIZATION N/A 10/29/2015   Procedure: Left Heart Cath and Cors/Grafts Angiography;  Surgeon: Iran OuchMuhammad A Arida, MD;  Location: [facility] INVASIVE CV LAB;  Service: Cardiovascular;  Laterality: N/A;  . CORONARY ARTERY BYPASS GRAFT N/A 07/05/2013   Procedure: CORONARY ARTERY BYPASS GRAFTING (CABG);  Surgeon: Kerin PernaPeter Van Trigt, MD;  [facility];  Service: Open Heart Surgery;  Laterality: N/A;  . INTRAOPERATIVE TRANSESOPHAGEAL ECHOCARDIOGRAM N/A 07/05/2013   Procedure: INTRAOPERATIVE TRANSESOPHAGEAL ECHOCARDIOGRAM;  Surgeon: Kerin PernaPeter Van Trigt, MD;  [facility];  Service: Open Heart Surgery;  Laterality: N/A;  . INTRAOPERATIVE TRANSTHORACIC ECHOCARDIOGRAM N/A 03/13/2018   Procedure: INTRAOPERATIVE TRANSTHORACIC ECHOCARDIOGRAM;  Surgeon: Kathleene HazelMcAlhany, Christopher D, MD;  [facility];  Service: Open Heart Surgery;  Laterality: N/A;  . NO PAST SURGERIES    . RIGHT/LEFT HEART CATH AND CORONARY ANGIOGRAPHY Bilateral 11/27/2017   Procedure: RIGHT/LEFT HEART CATH AND CORONARY ANGIOGRAPHY;  Surgeon: Iran OuchArida, Muhammad A, MD;  Location: [facility] INVASIVE CV LAB;  Service: Cardiovascular;  Laterality: Bilateral;  . TRANSCATHETER AORTIC VALVE REPLACEMENT, TRANSFEMORAL N/A 03/13/2018   Procedure: TRANSCATHETER AORTIC VALVE REPLACEMENT, TRANSFEMORAL. Edwards Sapien 3 Transcatheter Heart Valve size 26mm.;  Surgeon: Kathleene HazelMcAlhany, Christopher D, MD;  Location: [facility] OR;  Service: Open Heart Surgery;  Laterality: N/A;       Home Medications    Prior to  Admission medications   Medication Sig Start Date End Date Taking? Authorizing Provider  ibuprofen (ADVIL) 600 MG tablet Take 1 tablet (600 mg total) by mouth every 6 (six) hours as needed. 09/24/20  Yes Mickie Bail, NP  methocarbamol (ROBAXIN) 500 MG tablet Take 1 tablet (500 mg total) by mouth 2 (two) times daily as needed for  muscle spasms. 09/24/20  Yes Mickie Bail, NP  amLODipine (NORVASC) 10 MG tablet Take 1 tablet (10 mg total) by mouth daily. 07/15/20   Dunn, Raymon Mutton, PA-C  amoxicillin (AMOXIL) 500 MG capsule TAKE 4 CAPSULES BY MOUTH ONCE FOR 1 DOSE. TO BE TAKEN 30 60 MIN PRIOR TO DENTAL PROCEDURE. 02/10/19   [provider]  aspirin EC 81 MG tablet Take 1 tablet (81 mg total) by mouth daily. 10/20/15   Iran Ouch, MD  metoprolol tartrate (LOPRESSOR) 100 MG tablet Take 1 tablet (100 mg total) by mouth 2 (two) times daily. 06/12/20 09/10/20  Sondra Barges, PA-C  rosuvastatin (CRESTOR) 10 MG tablet Take 1 tablet (10 mg total) by mouth daily. 09/22/20 12/21/20  Iran Ouch, MD    Family History Family History  Problem Relation Age of Onset  . Heart disease Father   . Heart attack Father     Social History Social History   Tobacco Use  . Smoking status: Never Smoker  . Smokeless tobacco: Never Used  Vaping Use  . Vaping Use: Never used  Substance Use Topics  . Alcohol use: No  . Drug use: No     Allergies   Patient has no known allergies.   Review of Systems Review of Systems  Constitutional: Negative for chills and fever.  HENT: Negative for ear pain and sore throat.   Eyes: Negative for pain and visual disturbance.  Respiratory: Negative for cough and shortness of breath.   Cardiovascular: Negative for chest pain and palpitations.  Gastrointestinal: Negative for abdominal pain and vomiting.  Genitourinary: Negative for dysuria and hematuria.  Musculoskeletal: Positive for back pain. Negative for arthralgias and gait problem.  Skin: Negative for color change and rash.  Neurological: Positive for weakness and numbness. Negative for dizziness and headaches.  All other systems reviewed and are negative.    Physical Exam Triage Vital Signs ED Triage Vitals  Enc Vitals Group     BP 09/24/20 1559 (!) 145/87     Pulse Rate 09/24/20 1559 86     Resp 09/24/20 1559 19     Temp  09/24/20 1559 98.2 F (36.8 C)     Temp src --      SpO2 09/24/20 1559 97 %     Weight --      Height --      Head Circumference --      Peak Flow --      Pain Score 09/24/20 1557 5     Pain Loc --      Pain Edu? --      Excl. in GC? --    No data found.  Updated Vital Signs BP (!) 145/87   Pulse 86   Temp 98.2 F (36.8 C)   Resp 19   SpO2 97%   Visual Acuity Right Eye Distance:   Left Eye Distance:   Bilateral Distance:    Right Eye Near:   Left Eye Near:    Bilateral Near:     Physical Exam Vitals and nursing note reviewed.  Constitutional:      General: He  is not in acute distress.    Appearance: He is well-developed. He is not ill-appearing.  HENT:     Head: Normocephalic and atraumatic.     Mouth/Throat:     Mouth: Mucous membranes are moist.  Eyes:     Conjunctiva/sclera: Conjunctivae normal.  Cardiovascular:     Rate and Rhythm: Normal rate and regular rhythm.     Heart sounds: Normal heart sounds.  Pulmonary:     Effort: Pulmonary effort is normal. No respiratory distress.     Breath sounds: Normal breath sounds.  Abdominal:     General: Bowel sounds are normal.     Palpations: Abdomen is soft.     Tenderness: There is no abdominal tenderness. There is no right CVA tenderness, left CVA tenderness, guarding or rebound.  Musculoskeletal:        General: No swelling, tenderness, deformity or signs of injury. Normal range of motion.     Cervical back: Neck supple.  Skin:    General: Skin is warm and dry.     Capillary Refill: Capillary refill takes less than 2 seconds.     Findings: No bruising, erythema, lesion or rash.  Neurological:     General: No focal deficit present.     Mental Status: He is alert and oriented to person, place, and time.     Sensory: No sensory deficit.     Motor: No weakness.     Gait: Gait normal.     Comments: Negative straight leg raise.  All extremity strength 5/5.  Sensation intact.  Normal gait.  Psychiatric:         Mood and Affect: Mood normal.        Behavior: Behavior normal.      UC Treatments / Results  Labs (all labs ordered are listed, but only abnormal results are displayed) Labs Reviewed - No data to display  EKG   Radiology No results found.  Procedures Procedures (including critical care time)  Medications Ordered in UC Medications - No data to display  Initial Impression / Assessment and Plan / UC Course  I have reviewed the triage vital signs and the nursing notes.  Pertinent labs & imaging results that were available during my care of the patient were reviewed by me and considered in my medical decision making (see chart for details).   Acute back pain.  No indication of injury.  Neurovascular status intact.  Treating with ibuprofen and methocarbamol.  Precautions for drowsiness with muscle relaxer discussed.  Instructed patient to follow-up with his PCP or neurosurgeon.  Contact information for neurosurgeon Dr. Myer Haff provided as he is on-call today.  Patient agrees to plan of care.   Final Clinical Impressions(s) / UC Diagnoses   Final diagnoses:  Acute bilateral back pain, unspecified back location     Discharge Instructions     Take ibuprofen as needed for discomfort.  Take the muscle relaxer as needed for muscle spasm; Do not drive, operate machinery, or drink alcohol with this medication as it can cause drowsiness.   Follow up with your primary care provider or a neurosurgeon such as the one listed below.        ED Prescriptions    Medication Sig Dispense Auth. Provider   ibuprofen (ADVIL) 600 MG tablet Take 1 tablet (600 mg total) by mouth every 6 (six) hours as needed. 30 tablet Mickie Bail, NP   methocarbamol (ROBAXIN) 500 MG tablet Take 1 tablet (500 mg total) by mouth 2 (  two) times daily as needed for muscle spasms. 10 tablet Mickie Bail, NP     I have reviewed the PDMP during this encounter.   Mickie Bail, NP 09/24/20 1626

## 2020-10-09 ENCOUNTER — Other Ambulatory Visit: Payer: Self-pay | Admitting: Cardiovascular Disease

## 2020-11-02 ENCOUNTER — Other Ambulatory Visit: Payer: Self-pay | Admitting: Cardiovascular Disease

## 2020-12-07 ENCOUNTER — Emergency Department: Payer: BLUE CROSS/BLUE SHIELD

## 2020-12-07 ENCOUNTER — Emergency Department
Admission: EM | Admit: 2020-12-07 | Discharge: 2020-12-07 | Disposition: A | Discharge status: Home / Self Care | Payer: BLUE CROSS/BLUE SHIELD | Attending: Student in an Organized Health Care Education/Training Program | Admitting: Student in an Organized Health Care Education/Training Program

## 2020-12-07 ENCOUNTER — Other Ambulatory Visit: Payer: Self-pay

## 2020-12-07 DIAGNOSIS — I10 Essential (primary) hypertension: Secondary | ICD-10-CM | POA: Diagnosis not present

## 2020-12-07 DIAGNOSIS — R0602 Shortness of breath: Secondary | ICD-10-CM | POA: Diagnosis not present

## 2020-12-07 DIAGNOSIS — I2510 Atherosclerotic heart disease of native coronary artery without angina pectoris: Secondary | ICD-10-CM | POA: Insufficient documentation

## 2020-12-07 DIAGNOSIS — Z951 Presence of aortocoronary bypass graft: Secondary | ICD-10-CM | POA: Diagnosis not present

## 2020-12-07 DIAGNOSIS — M79661 Pain in right lower leg: Secondary | ICD-10-CM

## 2020-12-07 DIAGNOSIS — M542 Cervicalgia: Secondary | ICD-10-CM | POA: Insufficient documentation

## 2020-12-07 DIAGNOSIS — Z7982 Long term (current) use of aspirin: Secondary | ICD-10-CM | POA: Diagnosis not present

## 2020-12-07 DIAGNOSIS — Y92410 Unspecified street and highway as the place of occurrence of the external cause: Secondary | ICD-10-CM | POA: Insufficient documentation

## 2020-12-07 DIAGNOSIS — Z79899 Other long term (current) drug therapy: Secondary | ICD-10-CM | POA: Diagnosis not present

## 2020-12-07 DIAGNOSIS — R0789 Other chest pain: Secondary | ICD-10-CM | POA: Diagnosis not present

## 2020-12-07 DIAGNOSIS — M79604 Pain in right leg: Secondary | ICD-10-CM | POA: Diagnosis not present

## 2020-12-07 LAB — BASIC METABOLIC PANEL
Anion gap: 6 (ref 5–15)
BUN: 17 mg/dL (ref 8–23)
CO2: 25 mmol/L (ref 22–32)
Calcium: 8.5 mg/dL — ABNORMAL LOW (ref 8.9–10.3)
Chloride: 105 mmol/L (ref 98–111)
Creatinine, Ser: 0.75 mg/dL (ref 0.61–1.24)
GFR, Estimated: >60 mL/min (ref >60)
Glucose, Bld: 114 mg/dL — ABNORMAL HIGH (ref 70–99)
Potassium: 3.4 mmol/L — ABNORMAL LOW (ref 3.5–5.1)
Sodium: 136 mmol/L (ref 135–145)

## 2020-12-07 LAB — CBC
HCT: 46.9 % (ref 39.0–52.0)
Hemoglobin: 16.2 g/dL (ref 13.0–17.0)
MCH: 30.1 pg (ref 26.0–34.0)
MCHC: 34.5 g/dL (ref 30.0–36.0)
MCV: 87 fL (ref 80.0–100.0)
Platelets: 158 10*3/uL (ref 150–400)
RBC: 5.39 MIL/uL (ref 4.22–5.81)
RDW: 12.7 % (ref 11.5–15.5)
WBC: 7.2 10*3/uL (ref 4.0–10.5)
nRBC: 0 % (ref 0.0–0.2)

## 2020-12-07 LAB — TROPONIN I (HIGH SENSITIVITY)
Troponin I (High Sensitivity): 39 ng/L — ABNORMAL HIGH (ref <18)
Troponin I (High Sensitivity): 34 ng/L — ABNORMAL HIGH (ref <18)

## 2020-12-07 LAB — HEPATIC FUNCTION PANEL
ALT: 20 U/L (ref 0–44)
AST: 24 U/L (ref 15–41)
Albumin: 4.4 g/dL (ref 3.5–5.0)
Alkaline Phosphatase: 83 U/L (ref 38–126)
Bilirubin, Direct: 0.1 mg/dL (ref 0.0–0.2)
Indirect Bilirubin: 0.6 mg/dL (ref 0.3–0.9)
Total Bilirubin: 0.7 mg/dL (ref 0.3–1.2)
Total Protein: 7.8 g/dL (ref 6.5–8.1)

## 2020-12-07 LAB — LIPASE, BLOOD: Lipase: 33 U/L (ref 11–51)

## 2020-12-07 MED ORDER — MORPHINE SULFATE (PF) 4 MG/ML IV SOLN: 4.0000 mg | INTRAVENOUS | Status: DC | PRN

## 2020-12-07 MED ORDER — HYDROCODONE-ACETAMINOPHEN 5-325 MG PO TABS
1.0000 | ORAL_TABLET | Freq: Once | ORAL | Status: AC
  Administered 2020-12-07: 1 via ORAL
  Filled 2020-12-07: qty 1

## 2020-12-07 MED ORDER — OXYCODONE-ACETAMINOPHEN 5-325 MG PO TABS: 1.0000 | ORAL_TABLET | ORAL | 0 refills | Status: AC | PRN

## 2020-12-07 MED ORDER — OXYCODONE-ACETAMINOPHEN 5-325 MG PO TABS
1.0000 | ORAL_TABLET | Freq: Once | ORAL | Status: AC
  Administered 2020-12-07: 1 via ORAL
  Filled 2020-12-07: qty 1

## 2020-12-07 MED ORDER — ONDANSETRON HCL 4 MG/2ML IJ SOLN
4.0000 mg | Freq: Once | INTRAMUSCULAR | Status: DC
  Filled 2020-12-07: qty 2

## 2020-12-07 MED ORDER — KETOROLAC TROMETHAMINE 30 MG/ML IJ SOLN
15.0000 mg | Freq: Once | INTRAMUSCULAR | Status: AC
  Administered 2020-12-07: 15 mg via INTRAVENOUS
  Filled 2020-12-07: qty 1

## 2020-12-07 NOTE — ED Notes (Signed)
[id number] spanish interpreter assisting during d/c education.

## 2020-12-07 NOTE — ED Notes (Signed)
Attempted for 20g at R ac. Will have 2nd RN try.

## 2020-12-07 NOTE — ED Notes (Signed)
Bonita Quin RN stated will check for IV access soon.

## 2020-12-07 NOTE — ED Notes (Signed)
R dorsalis pedis pulse 2+.
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
## 2020-12-07 NOTE — ED Notes (Signed)
Called Allison in lab. States will add on the hepatic func panel and lipase now.

## 2020-12-07 NOTE — ED Notes (Signed)
No further attempts at 20g IV access since imaging requiring it d/c.

## 2020-12-07 NOTE — ED Notes (Signed)
Pt not yet back to room.  ?

## 2020-12-07 NOTE — ED Notes (Signed)
Eman NT to complete EKG soon. This RN to draw bloodwork soon.

## 2020-12-07 NOTE — ED Notes (Addendum)
Pt leaving for imaging now. EKG and bloodwork will be completed once pt back to room. Resp reg/unlabored; skin dry; sitting calmly in wheelchair.
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
## 2020-12-12 ENCOUNTER — Other Ambulatory Visit: Payer: Self-pay | Admitting: Physician Assistant

## 2021-02-09 ENCOUNTER — Other Ambulatory Visit: Payer: Self-pay | Admitting: Cardiovascular Disease

## 2021-04-04 ENCOUNTER — Other Ambulatory Visit: Payer: Self-pay | Admitting: Cardiovascular Disease

## 2021-04-05 ENCOUNTER — Other Ambulatory Visit: Payer: Self-pay

## 2021-04-05 NOTE — Telephone Encounter (Signed)
Attempted to schedule.  LMOV to call office.  ° °

## 2021-04-05 NOTE — Telephone Encounter (Signed)
Please contact pt for future appointment. Pt due for 6 month f/u. 

## 2021-04-25 ENCOUNTER — Other Ambulatory Visit: Payer: Self-pay | Admitting: Cardiovascular Disease

## 2021-06-10 ENCOUNTER — Other Ambulatory Visit: Payer: Self-pay

## 2021-06-10 ENCOUNTER — Ambulatory Visit: Payer: BLUE CROSS/BLUE SHIELD | Admitting: Cardiovascular Disease

## 2021-06-10 ENCOUNTER — Encounter: Payer: Self-pay | Admitting: Cardiovascular Disease

## 2021-06-10 VITALS — BP 138/90 | HR 107 | Ht 65.0 in | Wt 188.0 lb

## 2021-06-10 DIAGNOSIS — Z952 Presence of prosthetic heart valve: Secondary | ICD-10-CM

## 2021-06-10 DIAGNOSIS — I2510 Atherosclerotic heart disease of native coronary artery without angina pectoris: Secondary | ICD-10-CM

## 2021-06-10 DIAGNOSIS — I10 Essential (primary) hypertension: Secondary | ICD-10-CM

## 2021-06-10 DIAGNOSIS — E785 Hyperlipidemia, unspecified: Secondary | ICD-10-CM

## 2021-06-10 MED ORDER — ROSUVASTATIN CALCIUM 10 MG PO TABS
10.0000 mg | ORAL_TABLET | Freq: Every day | ORAL | 2 refills | Status: DC
Start: 2021-06-10 — End: 2022-03-02

## 2021-06-10 MED ORDER — METOPROLOL TARTRATE 100 MG PO TABS: 100.0000 mg | ORAL_TABLET | Freq: Two times a day (BID) | ORAL | 2 refills | Status: DC

## 2021-06-10 MED ORDER — AMLODIPINE BESYLATE 10 MG PO TABS: ORAL_TABLET | ORAL | 2 refills | Status: DC

## 2021-06-10 NOTE — Patient Instructions (Signed)
Medication Instructions:  Your physician recommends that you continue on your current medications as directed. Please refer to the Current Medication list given to you today.  Your cardiac medications have been refilled today.  *If you need a refill on your cardiac medications before your next appointment, please call your pharmacy*   Lab Work: None ordered If you have labs (blood work) drawn today and your tests are completely normal, you will receive your results only by: [place] (if you have MyChart) OR A paper copy in the mail If you have any lab test that is abnormal or we need to change your treatment, we will call you to review the results.   Testing/Procedures: None ordered   Follow-Up: At [facility], you and your health needs are our priority.  As part of our continuing mission to provide you with exceptional heart care, we have created designated Provider Care Teams.  These Care Teams include your primary Cardiologist (physician) and Advanced Practice Providers (APPs -  Physician Assistants and Nurse Practitioners) who all work together to provide you with the care you need, when you need it.  We recommend signing up for the patient portal called "MyChart".  Sign up information is provided on this After Visit Summary.  MyChart is used to connect with patients for Virtual Visits (Telemedicine).  Patients are able to view lab/test results, encounter notes, upcoming appointments, etc.  Non-urgent messages can be sent to your provider as well.   To learn more about what you can do with MyChart, go to [email or web address].    Your next appointment:   Your physician wants you to follow-up in: 6 months You will receive a reminder letter in the mail two months in advance. If you don't receive a letter, please call our office to schedule the follow-up appointment.   The format for your next appointment:   In Person  Provider:   You may see [name], MD or one  of the following Advanced Practice Providers on your designated Care Team:   Murray Hodgkins, NP Christell Faith, PA-C Cadence Kathlen Mody, PA-C{    Other Instructions N/A

## 2021-06-10 NOTE — Progress Notes (Signed)
Cardiology Office Note   Date:  06/10/2021   ID:  James Yates, DOB 27-Dec-1955, MRN [id number]  PCP: None Cardiologist:   [name], MD   Chief Complaint  Patient presents with   Other    6 month f/u c/o chest pain and headaches. Pt off medications x1 1/2 months due to cost and no insurance. Meds reviewed verbally with pt.      History of Present Illness: James Yates is a 66 y.o. male who presents for a followup visit regarding coronary artery disease status post CABG in February of 2015 after NSTEMI and severe aortic stenosis due to bicuspid aortic valve status post TAVR in October 2019. Other medical problems include hypertension, hyperlipidemia and RBBB . Cardiac catheterization in July 2019 showed significant underlying three-vessel coronary artery disease with patent LIMA to LAD, patent SVG to RCA and known chronically occluded SVG to OM 3 with occluded native left circumflex.  He had a ZIO monitor done in November of 2021 which showed 10 episodes of SVT.  Echocardiogram also November showed an EF of 50 to 55% with normal functioning TAVR prosthesis.    He lost his health insurance briefly recently and ran out of his medications.  He is insured again now.  In the setting of not taking his antihypertensive medications, he does report worsening chest pain and shortness of breath.   Past Medical History:  Diagnosis Date   Coronary artery disease    s/p CABG   Glucose intolerance (impaired glucose tolerance)    Hyperlipidemia    Hypertension    Obesity    S/P TAVR (transcatheter aortic valve replacement)    26 mm Edwards Sapien 3 transcatheter heart valve placed via percutaneous right transfemoral approach    Severe aortic stenosis     Past Surgical History:  Procedure Laterality Date   CARDIAC [facility]IZATION  [facility]/2013   [facility]   CARDIAC CATHETERIZATION N/A 10/29/2015   Procedure: Left Heart Cath and Cors/Grafts Angiography;  Surgeon: Wellington Hampshire, MD;   Location: [place] CV LAB;  Service: Cardiovascular;  Laterality: N/A;   CORONARY ARTERY BYPASS GRAFT N/A 07/05/2013   Procedure: CORONARY ARTERY BYPASS GRAFTING (CABG);  Surgeon: Ivin Poot, MD;  Location: [place];  Service: Open Heart Surgery;  Laterality: N/A;   INTRAOPERATIVE TRANSESOPHAGEAL ECHOCARDIOGRAM N/A 07/05/2013   Procedure: INTRAOPERATIVE TRANSESOPHAGEAL ECHOCARDIOGRAM;  Surgeon: Ivin Poot, MD;  Location: [place];  Service: Open Heart Surgery;  Laterality: N/A;   INTRAOPERATIVE TRANSTHORACIC ECHOCARDIOGRAM N/A 03/13/2018   Procedure: INTRAOPERATIVE TRANSTHORACIC ECHOCARDIOGRAM;  Surgeon: Burnell Blanks, MD;  Location: [place];  Service: Open Heart Surgery;  Laterality: N/A;   NO PAST SURGERIES     RIGHT/LEFT HEART CATH AND CORONARY ANGIOGRAPHY Bilateral 11/27/2017   Procedure: RIGHT/LEFT HEART CATH AND CORONARY ANGIOGRAPHY;  Surgeon: Wellington Hampshire, MD;  Location: [place] CV LAB;  Service: Cardiovascular;  Laterality: Bilateral;   TRANSCATHETER AORTIC VALVE REPLACEMENT, TRANSFEMORAL N/A 03/13/2018   Procedure: TRANSCATHETER AORTIC VALVE REPLACEMENT, TRANSFEMORAL. Edwards Sapien 3 Transcatheter Heart Valve size 36mm.;  Surgeon: Burnell Blanks, MD;  Location: [place];  Service: Open Heart Surgery;  Laterality: N/A;     Current Outpatient Medications  Medication Sig Dispense Refill   amoxicillin (AMOXIL) 500 MG capsule TAKE 4 CAPSULES BY MOUTH ONCE FOR 1 DOSE. TO BE TAKEN 30 60 MIN PRIOR TO DENTAL PROCEDURE.     aspirin EC 81 MG tablet Take 1 tablet (81 mg total) by mouth daily. 30 tablet  3   ibuprofen (ADVIL) 600 MG tablet Take 1 tablet (600 mg total) by mouth every 6 (six) hours as needed. 30 tablet 0   oxyCODONE-acetaminophen (PERCOCET) 5-325 MG tablet Take 1 tablet by mouth every 4 (four) hours as needed for severe pain. 8 tablet 0   amLODipine (NORVASC) 10 MG tablet TOME UNA TABLETA TODOS LOS DIAS (Patient not taking: Reported on 06/10/2021) 90 tablet  0   metoprolol tartrate (LOPRESSOR) 100 MG tablet Take 1 tablet (100 mg total) by mouth 2 (two) times daily. (Patient not taking: Reported on 06/10/2021) 180 tablet 3   rosuvastatin (CRESTOR) 10 MG tablet Take 1 tablet (10 mg total) by mouth daily. (Patient not taking: Reported on 06/10/2021) 90 tablet 2   No current facility-administered medications for this visit.    Allergies:   Patient has no known allergies.    Social History:  The patient  reports that he has never smoked. He has never used smokeless tobacco. He reports that he does not drink alcohol and does not use drugs.   Family History:  The patient's family history includes Heart attack in his father; Heart disease in his father.    ROS:  Please see the history of present illness.   Otherwise, review of systems are positive for none.   All other systems are reviewed and negative.    PHYSICAL EXAM: VS:  BP 138/90 (BP Location: Left Arm, Patient Position: Sitting, Cuff Size: Normal)    Pulse (!) 107    Ht 5\' 5"  (1.651 m)    Wt 188 lb (85.3 kg)    SpO2 98%    BMI 31.28 kg/m  , BMI Body mass index is 31.28 kg/m. GEN: Well nourished, well developed, in no acute distress  HEENT: normal  Neck: no JVD, carotid bruits, or masses Cardiac: RRR; no  rubs, or gallops,no edema . There is a 2/6 systolic aortic murmur which is a early peaking. Respiratory:  clear to auscultation bilaterally, normal work of breathing GI: soft, nontender, nondistended, + BS MS: no deformity or atrophy  Skin: warm and dry, no rash Neuro:  Strength and sensation are intact Psych: euthymic mood, full affect   EKG:  EKG is  ordered today. EKG done showed sinus tachycardia with right bundle branch block and old inferior infarct.  No significant change from before.  Recent Labs: 07/15/2020: Magnesium 2.0 12/07/2020: ALT 20; BUN 17; Creatinine, Ser 0.75; Hemoglobin 16.2; Platelets 158; Potassium 3.4; Sodium 136    Lipid Panel    Component Value Date/Time    CHOL 160 09/17/2020 0943   CHOL 193 03/31/2020 1019   CHOL 215 (H) 07/02/2013 0058   TRIG 158 (H) 09/17/2020 0943   TRIG 143 07/02/2013 0058   HDL 45 09/17/2020 0943   HDL 48 03/31/2020 1019   HDL 38 (L) 07/02/2013 0058   CHOLHDL 3.6 09/17/2020 0943   VLDL 32 09/17/2020 0943   VLDL 29 07/02/2013 0058   LDLCALC 83 09/17/2020 0943   LDLCALC 117 (H) 03/31/2020 1019   LDLCALC 148 (H) 07/02/2013 0058      Wt Readings from Last 3 Encounters:  06/10/21 188 lb (85.3 kg)  12/07/20 198 lb (89.8 kg)  09/17/20 206 lb 4 oz (93.6 kg)        ASSESSMENT AND PLAN:  1.  Status post TAVR for severe aortic stenosis: The patient reports worsening chest pain or shortness of breath likely due to not taking antianginal medications.  We refilled all his medications.  Consider repeat echocardiogram later this year.    Continue with antibiotic prophylaxis before dental procedures.  Most recent echo in November showed normal functioning TAVR prosthesis.  Continue aspirin indefinitely.  2. Coronary artery disease involving native coronary arteries with mild angina: Recent worsening due to running out of antianginal therapy which we will resume today.  3. Essential hypertension: Blood pressure is elevated.  I refilled his medications.  He has not taken them lately but he has health insurance again.  4. Hyperlipidemia: History of intolerance to statins but he is tolerating small dose rosuvastatin.  Most recent lipid profile showed an LDL of 83.  As long as he continues to tolerate rosuvastatin, we will plan on increasing the dose in the near future.    Disposition:   FU with me in 6 months  Signed,  [name], MD  06/10/2021 7:58 AM    [place]

## 2021-06-24 ENCOUNTER — Other Ambulatory Visit: Payer: Self-pay | Admitting: Cardiovascular Disease

## 2022-02-01 NOTE — Progress Notes (Unsigned)
Cardiology Office Note    Date:  02/02/2022   ID:  James Yates, DOB 1956/02/04, MRN [id number]  PCP:  Glori Luis, MD  Cardiologist:  Lorine Bears, MD  Electrophysiologist:  None   Chief Complaint: Follow-up  History of Present Illness:   James Yates is a 66 y.o. male with history of CAD s/p CABG in 06/2013 following a NSTEMI, bicuspid aortic valve with severe stenosis s/p TAVR in 02/2018, paroxysmal SVT, HTN, HLD, and RBBB who presents for follow up of CAD.   LHC in 10/2015 showed severe underlying 3-vessel CAD with a patent LIMA-LAD, and SVG-RCA. The SVG-LCx was occluded. The native LCx was occluded proximally with faint collaterals. There was moderate aortic stenosis with a peak to peak gradient of 22 mmHg. LHC was reviewed with our CTO team with the opinion being the LCx was not optimal for PCI. Echo in 09/2017 showed an EF of 60-65%, normal wall motion, progression of his aortic valve stenosis to severe with a peak velocity of 422 cm/s, mean gradient of 38 mmHg, and apeak gradient of 71 mmHg, left atrium was normal in size, RVSF normal, PASP 46 mmHg. He was seen on 11/23/2017 with worsening exertional chest tightness. He underwent R/LHC on 11/27/2017 that showed significant underlying 3-vessel CAD with a patent LIMA to LAD (could not be selectively engaged), patent SVG to RCA and known occluded SVG to OM3 along with an occluded LCx. There was moderate to severe aortic stenosis with a mean gradient of 26 mmHg and a valve area of 1.2. However, his aortic stenosis was felt to be likely severe based on echo findings and his valve area noted on 11/27/2017 was felt to be overestimated due to high cardiac output. Valve area of echo noted to be 0.6. The aortic valve morphology was also consistent with severe aortic stenosis. RHC showed mildly elevated filling pressures with mild pulmonary hypertension. He subsequently underwent TAVR in 02/2018. Echo from 02/2019 showed an EF of 60-65%, normal  RVSF and ventricular cavity size, normal PASP, and a normal functioning bioprosthetic aortic valve with a mean gradient of 10 mmHg.  He underwent Zio in 2021 patch monitoring which showed a predominant rhythm of sinus with an average heart rate of 89 bpm, IVCD was noted, 10 episodes of SVT with the longest episode lasting 16 beats with an average heart rate of 152 bpm, rare PACs and PVCs.  Some patient triggered events correlated with PVCs and sinus tachycardia.  Echo in 03/2020 showed an EF of 50 to 55%, no regional wall motion abnormalities, normal LV diastolic function parameters, normal RV systolic function and ventricular cavity size, and normal structure and function of aortic valve prosthesis.    He was last seen in the office in 05/2021 and had lost his health insurance briefly.  In this setting, he had ran out of medications.  Given this, he was not taking his antihypertensive medications and reported worsening chest pain and shortness of breath.  Symptoms were felt to be in the setting of being off antianginal medications.  At time of his visit, he was back under health insurance.  He comes in today and is doing reasonably well from a cardiac perspective.  He continues to note some exertional chest discomfort that is consistent with what he was feeling at his office visit earlier this year.  No dyspnea, diaphoresis, dizziness, nausea, or vomiting.  Symptoms have not significantly changed following the reinitiation of amlodipine or metoprolol.  He continues to note an  unchanged longstanding history of palpitations without associated symptoms.  He also complains of a chronic headache that is not new for him and is without associated or unusual symptoms.  Currently chest pain-free.   Labs independently reviewed: 11/2020 - albumin 4.4, AST/ALT normal, Hgb 16.2, PLT 158, potassium 3.4, BUN 17, serum creatinine 0.75 08/2020 - TC 160, TG 158, HDL 45, LDL 83 06/2020 - magnesium 2.0  Past Medical History:   Diagnosis Date   Coronary artery disease    s/p CABG   Glucose intolerance (impaired glucose tolerance)    Hyperlipidemia    Hypertension    Obesity    S/P TAVR (transcatheter aortic valve replacement)    26 mm Edwards Sapien 3 transcatheter heart valve placed via percutaneous right transfemoral approach    Severe aortic stenosis     Past Surgical History:  Procedure Laterality Date   CARDIAC CATHETERIZATION  06/2013   [facility]   CARDIAC CATHETERIZATION N/A 10/29/2015   Procedure: Left Heart Cath and Cors/Grafts Angiography;  Surgeon: Iran Ouch, MD;  Location: [facility] INVASIVE CV LAB;  Service: Cardiovascular;  Laterality: N/A;   CORONARY ARTERY BYPASS GRAFT N/A 07/05/2013   Procedure: CORONARY ARTERY BYPASS GRAFTING (CABG);  Surgeon: [name], MD;  Location: [facility] OR;  Service: Open Heart Surgery;  Laterality: N/A;   INTRAOPERATIVE TRANSESOPHAGEAL ECHOCARDIOGRAM N/A 07/05/2013   Procedure: INTRAOPERATIVE TRANSESOPHAGEAL ECHOCARDIOGRAM;  Surgeon: [name], MD;  Location: [facility] OR;  Service: Open Heart Surgery;  Laterality: N/A;   INTRAOPERATIVE TRANSTHORACIC ECHOCARDIOGRAM N/A 03/13/2018   Procedure: INTRAOPERATIVE TRANSTHORACIC ECHOCARDIOGRAM;  Surgeon: Kathleene Hazel, MD;  Location: [facility] OR;  Service: Open Heart Surgery;  Laterality: N/A;   NO PAST SURGERIES     RIGHT/LEFT HEART CATH AND CORONARY ANGIOGRAPHY Bilateral 11/27/2017   Procedure: RIGHT/LEFT HEART CATH AND CORONARY ANGIOGRAPHY;  Surgeon: Iran Ouch, MD;  Location: [facility] INVASIVE CV LAB;  Service: Cardiovascular;  Laterality: Bilateral;   TRANSCATHETER AORTIC VALVE REPLACEMENT, TRANSFEMORAL N/A 03/13/2018   Procedure: TRANSCATHETER AORTIC VALVE REPLACEMENT, TRANSFEMORAL. Edwards Sapien 3 Transcatheter Heart Valve size 13mm.;  Surgeon: Kathleene Hazel, MD;  Location: [facility] OR;  Service: Open Heart Surgery;  Laterality: N/A;    Current Medications: Current Meds  Medication Sig   Acetaminophen (TYLENOL  PO) Take by mouth as needed.   amitriptyline (ELAVIL) 10 MG tablet Take 10 mg by mouth at bedtime.   amLODipine (NORVASC) 10 MG tablet TOME UNA TABLETA TODOS LOS DIAS   aspirin EC 81 MG tablet Take 1 tablet (81 mg total) by mouth daily.   metoprolol tartrate (LOPRESSOR) 100 MG tablet Take 1 tablet (100 mg total) by mouth 2 (two) times daily.   rosuvastatin (CRESTOR) 10 MG tablet Take 1 tablet (10 mg total) by mouth daily.    Allergies:   Patient has no known allergies.   Social History   Socioeconomic History   Marital status: Married    Spouse name: Not on file   Number of children: Not on file   Years of education: Not on file   Highest education level: Not on file  Occupational History   Not on file  Tobacco Use   Smoking status: Never   Smokeless tobacco: Never  Vaping Use   Vaping Use: Never used  Substance and Sexual Activity   Alcohol use: No   Drug use: No   Sexual activity: Not on file  Other Topics Concern   Not on file  Social History Narrative   Not on file  Social Determinants of Health   Financial Resource Strain: Not on file  Food Insecurity: Not on file  Transportation Needs: Not on file  Physical Activity: Not on file  Stress: Not on file  Social Connections: Not on file     Family History:  The patient's family history includes Heart attack in his father; Heart disease in his father.  ROS:   12-point review of systems is negative unless otherwise noted in HPI.   EKGs/Labs/Other Studies Reviewed:    Studies reviewed were summarized above. The additional studies were reviewed today:  Zio patch 03/2020: Normal sinus rhythm with an average heart rate 89 bpm.  IVCD. 10 episodes of supraventricular tachycardia.  The longest lasted 16 beats with an average heart rate of 152 bpm. Rare PACs and rare PVCs. Some triggered events correlated with PVCs and sinus tachycardia. __________   2D echo 03/2020: 1. Left ventricular ejection fraction, by  estimation, is 50 to 55%. Left  ventricular ejection fraction by 3D volume is 53 %. The left ventricle has  low normal function. The left ventricle has no regional wall motion  abnormalities. Left ventricular  diastolic parameters were normal.   2. Right ventricular systolic function is normal. The right ventricular  size is normal.   3. The mitral valve is normal in structure. No evidence of mitral valve  regurgitation.   4. The aortic valve has been repaired/replaced. Aortic valve  regurgitation is not visualized. Echo findings are consistent with normal  structure and function of the aortic valve prosthesis. ___________  See Epic for remaining cardiac studies   EKG:  EKG is ordered today.  The EKG ordered today demonstrates NSR, 87 bpm, RBBB with old inferior infarct, consistent with prior tracing  Recent Labs: No results found for requested labs within last 365 days.  Recent Lipid Panel    Component Value Date/Time   CHOL 160 09/17/2020 0943   CHOL 193 03/31/2020 1019   CHOL 215 (H) 07/02/2013 0058   TRIG 158 (H) 09/17/2020 0943   TRIG 143 07/02/2013 0058   HDL 45 09/17/2020 0943   HDL 48 03/31/2020 1019   HDL 38 (L) 07/02/2013 0058   CHOLHDL 3.6 09/17/2020 0943   VLDL 32 09/17/2020 0943   VLDL 29 07/02/2013 0058   LDLCALC 83 09/17/2020 0943   LDLCALC 117 (H) 03/31/2020 1019   LDLCALC 148 (H) 07/02/2013 0058    PHYSICAL EXAM:    VS:  BP 128/78 (BP Location: Left Arm, Patient Position: Sitting, Cuff Size: Normal)   Pulse 87   Ht 5\' 5"  (1.651 m)   Wt 195 lb 6 oz (88.6 kg)   SpO2 98%   BMI 32.51 kg/m   BMI: Body mass index is 32.51 kg/m.  Physical Exam Vitals reviewed.  Constitutional:      Appearance: He is well-developed.  HENT:     Head: Normocephalic and atraumatic.  Eyes:     General:        Right eye: No discharge.        Left eye: No discharge.  Neck:     Vascular: No JVD.  Cardiovascular:     Rate and Rhythm: Normal rate and regular rhythm.      Pulses:          Posterior tibial pulses are 2+ on the right side and 2+ on the left side.     Heart sounds: S1 normal and S2 normal. Heart sounds not distant. No midsystolic click and no opening snap.  Murmur heard.     Systolic murmur is present with a grade of 2/6 at the upper right sternal border.     No friction rub.  Pulmonary:     Effort: Pulmonary effort is normal. No respiratory distress.     Breath sounds: Normal breath sounds. No decreased breath sounds, wheezing or rales.  Chest:     Chest wall: No tenderness.  Abdominal:     General: There is no distension.  Musculoskeletal:     Cervical back: Normal range of motion.     Right lower leg: No edema.     Left lower leg: No edema.  Skin:    General: Skin is warm and dry.     Nails: There is no clubbing.  Neurological:     Mental Status: He is alert and oriented to person, place, and time.  Psychiatric:        Speech: Speech normal.        Behavior: Behavior normal.        Thought Content: Thought content normal.        Judgment: Judgment normal.     Wt Readings from Last 3 Encounters:  02/02/22 195 lb 6 oz (88.6 kg)  06/10/21 188 lb (85.3 kg)  12/07/20 198 lb (89.8 kg)     ASSESSMENT & PLAN:   CAD involving the native coronary arteries with chest pain with moderate risk for cardiac etiology: Currently chest pain-free.  Schedule Lexiscan MPI to evaluate for high risk ischemia.  Continue aggressive risk factor modification and secondary prevention including aspirin, amlodipine, metoprolol tartrate, and rosuvastatin.  Defer addition of isosorbide mononitrate given he is prone to headaches.  Bioprosthetic aortic valve status post TAVR: He remains on aspirin.  Schedule echo.  Refilled amoxicillin for SBE prophylaxis.  Instructions were discussed in detail.  HTN: Blood pressure is well controlled at today's visit.  He remains on amlodipine and metoprolol tartrate.  HLD: LDL 83 in 08/2020.  He has history of intolerance to  statins, tolerating rosuvastatin 10 mg daily.  Future orders for lipid and LFT have been placed.  Language barrier: [facility] medical interpreter utilized for today's visit.  Headache: Longstanding issue.  No red flag or atypical symptoms.  Follow-up with PCP.   Shared Decision Making/Informed Consent{  The risks [chest pain, shortness of breath, cardiac arrhythmias, dizziness, blood pressure fluctuations, myocardial infarction, stroke/transient ischemic attack, nausea, vomiting, allergic reaction, radiation exposure, metallic taste sensation and life-threatening complications (estimated to be 1 in 10,000)], benefits (risk stratification, diagnosing coronary artery disease, treatment guidance) and alternatives of a nuclear stress test were discussed in detail with Mr. Liberati and he agrees to proceed.     Disposition: F/u with Dr. Kirke Corin or an APP in 2 months.   Medication Adjustments/Labs and Tests Ordered: Current medicines are reviewed at length with the patient today.  Concerns regarding medicines are outlined above. Medication changes, Labs and Tests ordered today are summarized above and listed in the Patient Instructions accessible in Encounters.   Signed, Eula Listen, PA-C 02/02/2022 4:23 PM     [facility] [address] [place], [place] [address] [phone]

## 2022-02-02 ENCOUNTER — Encounter: Payer: Self-pay | Admitting: Physician Assistant

## 2022-02-02 ENCOUNTER — Ambulatory Visit: Payer: Medicare Other | Attending: Physician Assistant | Admitting: Physician Assistant

## 2022-02-02 VITALS — BP 128/78 | HR 87 | Ht 65.0 in | Wt 195.4 lb

## 2022-02-02 DIAGNOSIS — I10 Essential (primary) hypertension: Secondary | ICD-10-CM | POA: Diagnosis present

## 2022-02-02 DIAGNOSIS — I2510 Atherosclerotic heart disease of native coronary artery without angina pectoris: Secondary | ICD-10-CM

## 2022-02-02 DIAGNOSIS — Z951 Presence of aortocoronary bypass graft: Secondary | ICD-10-CM | POA: Diagnosis not present

## 2022-02-02 DIAGNOSIS — E785 Hyperlipidemia, unspecified: Secondary | ICD-10-CM | POA: Diagnosis present

## 2022-02-02 DIAGNOSIS — Z952 Presence of prosthetic heart valve: Secondary | ICD-10-CM | POA: Insufficient documentation

## 2022-02-02 DIAGNOSIS — R079 Chest pain, unspecified: Secondary | ICD-10-CM | POA: Diagnosis not present

## 2022-02-02 DIAGNOSIS — I25118 Atherosclerotic heart disease of native coronary artery with other forms of angina pectoris: Secondary | ICD-10-CM | POA: Diagnosis not present

## 2022-02-02 DIAGNOSIS — Z789 Other specified health status: Secondary | ICD-10-CM | POA: Insufficient documentation

## 2022-02-02 DIAGNOSIS — I350 Nonrheumatic aortic (valve) stenosis: Secondary | ICD-10-CM | POA: Diagnosis not present

## 2022-02-02 MED ORDER — AMOXICILLIN 500 MG PO CAPS: ORAL_CAPSULE | ORAL | 1 refills | Status: DC

## 2022-02-02 NOTE — Patient Instructions (Signed)
Medication Instructions:  Your physician recommends that you continue on your current medications as directed. Please refer to the Current Medication list given to you today.  *If you need a refill on your cardiac medications before your next appointment, please call your pharmacy*   Lab Work: Lipid, Liver, CBC to be done the morning of your stress test. Please arrive 30 minutes earlier in order to have these done before your test.  No appointment is needed.  Medical Mall Entrance at [facility] 1st desk on the right to check in (REGISTRATION)  Lab hours: Monday- Friday (7:30 am- 5:30 pm)  If you have labs (blood work) drawn today and your tests are completely normal, you will receive your results only by: MyChart Message (if you have MyChart) OR A paper copy in the mail If you have any lab test that is abnormal or we need to change your treatment, we will call you to review the results.   Testing/Procedures:  Your physician has requested that you have an echocardiogram. Echocardiography is a painless test that uses sound waves to create images of your heart. It provides your doctor with information about the size and shape of your heart and how well your heart's chambers and valves are working. This procedure takes approximately one hour. There are no restrictions for this procedure.  [facility] MYOVIEW  Your caregiver has ordered a Stress Test with nuclear imaging. The purpose of this test is to evaluate the blood supply to your heart muscle. This procedure is referred to as a "Non-Invasive Stress Test." This is because other than having an IV started in your vein, nothing is inserted or "invades" your body. Cardiac stress tests are done to find areas of poor blood flow to the heart by determining the extent of coronary artery disease (CAD). Some patients exercise on a treadmill, which naturally increases the blood flow to your heart, while others who are  unable to walk on a treadmill due to physical  limitations have a pharmacologic/chemical stress agent called Lexiscan . This medicine will mimic walking on a treadmill by temporarily increasing your coronary blood flow.   Please note: these test may take anywhere between 2-4 hours to complete  PLEASE REPORT TO [facility] MEDICAL MALL ENTRANCE  THE VOLUNTEERS AT THE FIRST DESK WILL DIRECT YOU WHERE TO GO  Date of Procedure:______________________  Arrival Time for Procedure:___________________________  Instructions regarding medication:   __XX__ : You may take all of your medications with sip of water.     PLEASE NOTIFY THE OFFICE AT LEAST 24 HOURS IN ADVANCE IF YOU ARE UNABLE TO KEEP YOUR APPOINTMENT.  [phone] AND  PLEASE NOTIFY NUCLEAR MEDICINE AT [facility] AT LEAST 24 HOURS IN ADVANCE IF YOU ARE UNABLE TO KEEP YOUR APPOINTMENT. [phone]  How to prepare for your Myoview test:  Do not eat or drink after midnight No caffeine for 24 hours prior to test No smoking 24 hours prior to test. Your medication may be taken with water.  If your doctor stopped a medication because of this test, do not take that medication. Ladies, please do not wear dresses.  Skirts or pants are appropriate. Please wear a short sleeve shirt. No perfume, cologne or lotion. Wear comfortable walking shoes. No heels!    Follow-Up: At [facility], you and your health needs are our priority.  As part of our continuing mission to provide you with exceptional heart care, we have created designated Provider Care Teams.  These Care Teams include your primary  Cardiologist (physician) and Advanced Practice Providers (APPs -  Physician Assistants and Nurse Practitioners) who all work together to provide you with the care you need, when you need it.  We recommend signing up for the patient portal called "MyChart".  Sign up information is provided on this After Visit Summary.  MyChart is used to connect with patients for Virtual Visits (Telemedicine).  Patients  are able to view lab/test results, encounter notes, upcoming appointments, etc.  Non-urgent messages can be sent to your provider as well.   To learn more about what you can do with MyChart, go to [email or web address].    Your next appointment:   2 month(s)  The format for your next appointment:   In Person  Provider:   Lorine Bears, MD or Eula Listen, PA-C      Important Information About Sugar

## 2022-02-04 NOTE — Addendum Note (Signed)
Addended by: Iverson Alamin C on: 02/04/2022 11:05 AM   Modules accepted: Orders

## 2022-02-10 ENCOUNTER — Encounter
Admission: RE | Admit: 2022-02-10 | Discharge: 2022-02-10 | Disposition: A | Discharge status: Home / Self Care | Payer: Medicare Other | Source: Ambulatory Visit | Attending: Physician Assistant | Admitting: Physician Assistant

## 2022-02-10 DIAGNOSIS — R079 Chest pain, unspecified: Secondary | ICD-10-CM | POA: Insufficient documentation

## 2022-02-10 LAB — NM MYOCAR MULTI W/SPECT W/WALL MOTION / EF
LV dias vol: 74 mL (ref 62–150)
LV sys vol: 32 mL
Nuc Stress EF: 57 %
Peak HR: 89 {beats}/min
Rest HR: 63 {beats}/min
Rest Nuclear Isotope Dose: 10.1 mCi
SDS: 14
SRS: 4
SSS: 18
ST Depression (mm): 0 mm
Stress Nuclear Isotope Dose: 32.1 mCi
TID: 1.17

## 2022-02-10 MED ORDER — TECHNETIUM TC 99M TETROFOSMIN IV KIT
32.1400 | PACK | Freq: Once | INTRAVENOUS | Status: AC | PRN
  Administered 2022-02-10: 32.14 via INTRAVENOUS

## 2022-02-10 MED ORDER — TECHNETIUM TC 99M TETROFOSMIN IV KIT
10.1300 | PACK | Freq: Once | INTRAVENOUS | Status: AC | PRN
  Administered 2022-02-10: 10.13 via INTRAVENOUS

## 2022-02-10 MED ORDER — REGADENOSON 0.4 MG/5ML IV SOLN
0.4000 mg | Freq: Once | INTRAVENOUS | Status: AC
  Administered 2022-02-10: 0.4 mg via INTRAVENOUS

## 2022-03-01 ENCOUNTER — Other Ambulatory Visit: Payer: Self-pay | Admitting: Cardiovascular Disease

## 2022-03-02 ENCOUNTER — Other Ambulatory Visit: Payer: Self-pay

## 2022-03-02 MED ORDER — METOPROLOL TARTRATE 100 MG PO TABS: 100.0000 mg | ORAL_TABLET | Freq: Two times a day (BID) | ORAL | 1 refills | Status: DC

## 2022-03-02 NOTE — Telephone Encounter (Signed)
Refill sent to pharmacy.   

## 2022-03-02 NOTE — Telephone Encounter (Signed)
Refill to pharmacy 

## 2022-03-31 ENCOUNTER — Ambulatory Visit: Payer: Medicare Other

## 2022-04-08 ENCOUNTER — Ambulatory Visit: Payer: Medicare Other | Admitting: Physician Assistant

## 2022-04-10 NOTE — Progress Notes (Incomplete)
Cardiology Office Note:    Date:  04/11/2022   ID:  James Yates, DOB 05/29/1956, MRN [id number]  PCP:  Glori LuisSonnenberg, Eric G, MD   [facility] Providers Cardiologist:  Lorine BearsMuhammad Arida, MD     Referring MD: Glori LuisSonnenberg, Eric G, MD   Chief Complaint  Patient presents with   Follow-up    2 month follow up visit. Patient has a little cough with a little soreness in throat. Meds reviewed with patient.    History of Present Illness:    James Yates is a 66 y.o. male with a hx of the following:   CAD, s/p CABG x 4 in 2015 following NSTEMI HLD Bicuspid AV with severe AS, s/p TAVR in 2019 HTN PSVT RBBB Depression  In 2017 he underwent left heart cath that revealed severe three-vessel CAD with patent SVG to RCA and LIMA to LAD.  SVG to left circumflex was occluded and the native left circumflex was occluded proximally with faint collaterals.  Moderate aortic stenosis was noted with a peak to peak gradient of 22 mmHg. it was felt that the left circumflex was not optimal for PCI.  In 2019 echo revealed EF of 60 to 65%, his aortic valve stenosis progressed to severe with peak velocity of 422 cm/s, mean gradient of 38 mmHg, peak gradient was 71 mmHg, normal wall motion, PASP 46 mmHg.  In 2019 he underwent right and left heart cath that showed significant three-vessel CAD with patent LIMA to LAD, patent SVG to RCA and known occluded SVG to OM 3 along with an occluded left circumflex..  It was also noted there was moderate to severe aortic stenosis with mean gradient 26 mmHg with a valve area of 1.2.  However, aortic stenosis was likely felt to be severe based on the echocardiogram findings, valve area of echo noted to be 0.6.  It was also noted there were mildly elevated filling pressures with mild pulmonary hypertension.  Therefore, underwent TAVR in 2019 and echo the following year showed EF of 60 to 65%, normal PASP and normal RVSF, and normal functioning bioprosthetic aortic valve  with mean gradient of 10 mmHg.  Zio patch in 2021 showed predominant sinus rhythm, IVCD noted, 10 SVT episodes with longest lasting 16 beats, rare PVCs and PACs.  Some triggered events were correlated with sinus tach and PVCs.  Echo in 2021 revealed EF 50 to 55%, no RWMA, as well as normal structure and function of aortic valve prosthesis.   Last seen by Eula Listenyan Dunn, PA-C on February 02, 2022 and was doing well from a cardiac perspective.  He did note some exertional chest discomfort that was consistent to his experience in January 2023, and symptoms were not changed since taking his metoprolol or amlodipine.  Admitted to unchanged chronic history of palpitations without associated symptoms.  Lexiscan arranged and revealed no significant ischemia, low risk scan.  There was a small region of fixed defect in the apical, distal anterior apical, inferior apical region, was unable to exclude small region prior MI.  Normal wall motion noted with EF estimated at 46%.  2D complete echo was also arranged and scheduled for him.  Today he presents for follow-up.  He presents today with medical interpreter in the room.  Some difficulty obtaining history as patient does not speak [place].  Continues to experience exertional chest pressure, unable to compare progression since last office visit in September, but says this has not worsened.  Notices with walking 10 to 15 m, then  develops chest pressure, has to rest and last for about 20 seconds then starts to get better.  Unable to rate this on a 0-10 pain scale.  Does state it is generalized across his chest.  Does also admit to some right arm pain.  And has symptoms of what sounds like bilateral carpal tunnel syndrome. Does admit to some recent reflux. Denies any SHOB, palpitations, syncope, presyncope, dizziness, orthopnea, PND, swelling, or claudication. Denies current chest pressure today and denies any other questions or concerns.   Past Medical History:  Diagnosis Date    Coronary artery disease    s/p CABG   Glucose intolerance (impaired glucose tolerance)    Headache    chronic, no red flag or atypical signs or symptoms   Hyperlipidemia    Hypertension    Obesity    S/P TAVR (transcatheter aortic valve replacement)    26 mm Edwards Sapien 3 transcatheter heart valve placed via percutaneous right transfemoral approach    Severe aortic stenosis     Past Surgical History:  Procedure Laterality Date   CARDIAC CATHETERIZATION  06/2013   [facility]   CARDIAC CATHETERIZATION N/A 10/29/2015   Procedure: Left Heart Cath and Cors/Grafts Angiography;  Surgeon: Iran Ouch, MD;  Location: [facility] INVASIVE CV LAB;  Service: Cardiovascular;  Laterality: N/A;   CORONARY ARTERY BYPASS GRAFT N/A 07/05/2013   Procedure: CORONARY ARTERY BYPASS GRAFTING (CABG);  Surgeon: [name], MD;  Location: [facility] OR;  Service: Open Heart Surgery;  Laterality: N/A;   INTRAOPERATIVE TRANSESOPHAGEAL ECHOCARDIOGRAM N/A 07/05/2013   Procedure: INTRAOPERATIVE TRANSESOPHAGEAL ECHOCARDIOGRAM;  Surgeon: [name], MD;  Location: [facility] OR;  Service: Open Heart Surgery;  Laterality: N/A;   INTRAOPERATIVE TRANSTHORACIC ECHOCARDIOGRAM N/A 03/13/2018   Procedure: INTRAOPERATIVE TRANSTHORACIC ECHOCARDIOGRAM;  Surgeon: Kathleene Hazel, MD;  Location: [facility] OR;  Service: Open Heart Surgery;  Laterality: N/A;   NO PAST SURGERIES     RIGHT/LEFT HEART CATH AND CORONARY ANGIOGRAPHY Bilateral 11/27/2017   Procedure: RIGHT/LEFT HEART CATH AND CORONARY ANGIOGRAPHY;  Surgeon: Iran Ouch, MD;  Location: [facility] INVASIVE CV LAB;  Service: Cardiovascular;  Laterality: Bilateral;   TRANSCATHETER AORTIC VALVE REPLACEMENT, TRANSFEMORAL N/A 03/13/2018   Procedure: TRANSCATHETER AORTIC VALVE REPLACEMENT, TRANSFEMORAL. Edwards Sapien 3 Transcatheter Heart Valve size 60mm.;  Surgeon: Kathleene Hazel, MD;  Location: [facility] OR;  Service: Open Heart Surgery;  Laterality: N/A;    Current Medications: Current  Meds  Medication Sig   amitriptyline (ELAVIL) 10 MG tablet Take 10 mg by mouth at bedtime.   amLODipine (NORVASC) 10 MG tablet TOME UNA TABLETA TODOS LOS DIAS   aspirin EC 81 MG tablet Take 1 tablet (81 mg total) by mouth daily.   metoprolol tartrate (LOPRESSOR) 100 MG tablet Take 1 tablet (100 mg total) by mouth 2 (two) times daily.   pantoprazole (PROTONIX) 40 MG tablet Take 1 tablet (40 mg total) by mouth daily.   rosuvastatin (CRESTOR) 10 MG tablet Take 1 tablet by mouth once daily     Allergies:   Patient has no known allergies.   Social History   Socioeconomic History   Marital status: Married    Spouse name: Not on file   Number of children: Not on file   Years of education: Not on file   Highest education level: Not on file  Occupational History   Not on file  Tobacco Use   Smoking status: Never   Smokeless tobacco: Never  Vaping Use   Vaping Use: Never used  Substance and Sexual Activity   Alcohol use: No   Drug use: No   Sexual activity: Not on file  Other Topics Concern   Not on file  Social History Narrative   Not on file   Social Determinants of Health   Financial Resource Strain: Not on file  Food Insecurity: Not on file  Transportation Needs: Not on file  Physical Activity: Not on file  Stress: Not on file  Social Connections: Not on file     Family History: The patient's family history includes Heart attack in his father; Heart disease in his father.  ROS:   Review of Systems  Constitutional: Negative.   HENT: Negative.    Eyes: Negative.   Respiratory: Negative.    Cardiovascular:  Positive for chest pain. Negative for palpitations, orthopnea, claudication, leg swelling and PND.  Gastrointestinal:  Positive for abdominal pain and heartburn. Negative for blood in stool, constipation, diarrhea, melena, nausea and vomiting.       Epigastric pain.   Genitourinary: Negative.   Musculoskeletal: Negative.   Skin: Negative.   Neurological:  Negative.   Endo/Heme/Allergies: Negative.   Psychiatric/Behavioral: Negative.      Please see the history of present illness.    All other systems reviewed and are negative.  EKGs/Labs/Other Studies Reviewed:    The following studies were reviewed today:   EKG:  EKG is not ordered today.   Echocardiogram scheduled for 05/18/2022  Lexiscan on February 10, 2022: Pharmacological myocardial perfusion imaging study with no significant  ischemia Small region fixed defect in the apical, distal anterior apical, inferior apical region, unable to exclude small region prior MI Normal wall motion, EF estimated at 46% No EKG changes concerning for ischemia at peak stress or in recovery. CT attenuation correction images with mild aortic atherosclerosis and moderate coronary calcification, TAVR valve noted Low risk scan  2D echo on 04/16/2020:  1. Left ventricular ejection fraction, by estimation, is 50 to 55%. Left  ventricular ejection fraction by 3D volume is 53 %. The left ventricle has  low normal function. The left ventricle has no regional wall motion  abnormalities. Left ventricular  diastolic parameters were normal.   2. Right ventricular systolic function is normal. The right ventricular  size is normal.   3. The mitral valve is normal in structure. No evidence of mitral valve  regurgitation.   4. The aortic valve has been repaired/replaced. Aortic valve  regurgitation is not visualized. Echo findings are consistent with normal  structure and function of the aortic valve prosthesis.   14-day ZIO monitor on March 31, 2020: Normal sinus rhythm with an average heart rate 89 bpm.  IVCD. 10 episodes of supraventricular tachycardia.  The longest lasted 16 beats with an average heart rate of 152 bpm. Rare PACs and rare PVCs. Some triggered events correlated with PVCs and sinus tachycardia.  Vascular ultrasound bilateral carotid duplex on February 27, 2018: Right Carotid:  Velocities in the right ICA are consistent with a 1-39%  stenosis.   Left Carotid: Velocities in the left ICA are consistent with a 1-39%  stenosis.   Vertebrals: Bilateral vertebral arteries demonstrate antegrade flow.    Coronary CTA on December 14, 2017: 1. Trileaflet aortic valve with partially co-joined left and right leaflets. Leaflets are moderately thickened and calcified leaflets with moderately restricted leaflet excursions and no calcifications are extending into the LVOT. Annular measurements suitable for delivery of a 23 mm Edwards-SAPIEN 3 valve.   2. Sufficient coronary to annulus  distance.   3. Optimum Fluoroscopic Angle for Delivery:  RAO 5 CAU 3   4. No thrombus in the left atrial appendage. 1. Vascular findings and measurements pertinent to potential TAVR procedure, as detailed above. 2. Severe thickening calcification of the aortic valve, compatible with the reported clinical history of severe aortic stenosis. 3. Aortic atherosclerosis, in addition to 3 vessel coronary artery disease. Status post median sternotomy for CABG including LIMA to the LAD. 4. Additional incidental findings, as above.  Right and left heart cath on November 27, 2017: Ost LAD to Mid LAD lesion is 80% stenosed. Dist LAD lesion is 30% stenosed. Ost 1st Diag to 1st Diag lesion is 70% stenosed. Ost 2nd Diag to 2nd Diag lesion is 100% stenosed. Prox RCA lesion is 30% stenosed. Mid RCA lesion is 100% stenosed. Prox Graft lesion before Ramus is 100% stenosed. The graft exhibits no disease. LIMA. The graft exhibits no disease. Prox Cx to Mid Cx lesion is 100% stenosed. Origin lesion is 100% stenosed.   1.  Significant underlying three-vessel coronary artery disease with patent LIMA to LAD (could not be selectively engaged), patent SVG to RCA and known occluded SVG to OM 3.  Occluded native left circumflex. 2.  Moderate to severe aortic stenosis on current hemodynamics with a mean gradient of 26  mmHg and valve area of 1.2.  However, his aortic stenosis is likely severe based on echocardiogram findings.  Aortic valve area today is overestimated due to high cardiac output.  Valve area by echocardiogram was 0.6.  Valve morphology is also consistent with severe stenosis. 3.  Right heart catheterization showed mildly elevated filling pressures with mild pulmonary hypertension.   Recommendations:  Recommend aortic valve replacement +1 vessel CABG to OM1 (which is the biggest of OM branches).   Left heart cath on October 29, 2015: SVG . Prox Graft lesion, before Ramus, 100% stenosed. Prox RCA lesion, 30% stenosed. Mid RCA lesion, 100% stenosed. Ost LAD to Mid LAD lesion, 90% stenosed. Ost 1st Diag to 1st Diag lesion, 70% stenosed. Dist LAD lesion, 30% stenosed. Ost 2nd Diag to 2nd Diag lesion, 100% stenosed. Prox Cx lesion, 100% stenosed. SVG is and is anatomically normal. LIMA is and is anatomically normal. The left ventricular systolic function is normal.   1. Severe underlying three-vessel coronary artery disease with patent grafts except SVG to left circumflex. Patent LIMA to LAD and SVG to right PDA. Chronically occluded left circumflex with left to left and right-to-left collaterals. 2. Normal LV systolic function ( EF 50-55%) with normal left ventricular end-diastolic pressure. 3. Moderate aortic stenosis with a peak to peak gradient of 22 mmHg.   Recommendations: Aggressive medical therapy. Left circumflex CTO PCI should be considered given that the stress test was high risk. Recent Labs: 04/11/2022: ALT 19; BUN 17; Creatinine, Ser 1.03; Hemoglobin 16.3; Platelets 178; Potassium 4.0; Sodium 141  Recent Lipid Panel    Component Value Date/Time   CHOL 223 (H) 04/11/2022 1057   CHOL 193 03/31/2020 1019   CHOL 215 (H) 07/02/2013 0058   TRIG 274 (H) 04/11/2022 1057   TRIG 143 07/02/2013 0058   HDL 42 04/11/2022 1057   HDL 48 03/31/2020 1019   HDL 38 (L) 07/02/2013 0058    CHOLHDL 5.3 04/11/2022 1057   VLDL 55 (H) 04/11/2022 1057   VLDL 29 07/02/2013 0058   LDLCALC 126 (H) 04/11/2022 1057   LDLCALC 117 (H) 03/31/2020 1019   LDLCALC 148 (H) 07/02/2013 0058     Risk  Assessment/Calculations:      HYPERTENSION CONTROL Vitals:   04/11/22 1002 04/11/22 1015  BP: (!) 168/92 (!) 145/99    The patient's blood pressure is elevated above target today.  In order to address the patient's elevated BP: Blood pressure will be monitored at home to determine if medication changes need to be made.; A new medication will prescribed once lab work comes back stable.; Follow up with general cardiology has been recommended.            Physical Exam:    VS:  BP (!) 145/99 (BP Location: Left Arm, Patient Position: Sitting, Cuff Size: Normal)   Pulse 74   Ht 5\' 5"  (1.651 m)   Wt 199 lb 12.8 oz (90.6 kg)   SpO2 97%   BMI 33.25 kg/m     Wt Readings from Last 3 Encounters:  04/11/22 199 lb 12.8 oz (90.6 kg)  02/02/22 195 lb 6 oz (88.6 kg)  06/10/21 188 lb (85.3 kg)     GEN: Obese, 66 y.o. male in NAD   HEENT: Normal NECK: No JVD; No carotid bruits CARDIAC: S1/S2, RRR, no murmurs, rubs, gallops; 2+ peripheral pulses throughout, strong and equal bilaterally RESPIRATORY:  Clear and diminished to auscultation without rales, wheezing or rhonchi  MUSCULOSKELETAL:  No edema; No deformity  SKIN: Warm and dry NEUROLOGIC:  Alert and oriented x 3 PSYCHIATRIC:  Normal affect   ASSESSMENT:    1. Coronary artery disease involving native coronary artery of native heart with other form of angina pectoris (HCC)   2. Hyperlipidemia, unspecified hyperlipidemia type   3. Essential hypertension   4. Severe aortic stenosis   5. S/P TAVR (transcatheter aortic valve replacement)   6. Class 1 obesity due to excess calories with serious comorbidity and body mass index (BMI) of 33.0 to 33.9 in adult   7. Chronic nonintractable headache, unspecified headache type   8.  Gastroesophageal reflux disease, unspecified whether esophagitis present   9. Language barrier    PLAN:    In order of problems listed above:  Coronary artery disease, s/p CABG x4 in 2015 following NSTEMI Recent Myoview in 01/2022 did not show any significant blockages with possible small region of prior MI.  EF was estimated to be 46%.  Overall this was low risk and reassuring.  No worsening symptoms since office visit in 01/2022, but still having exertional chest pressure, currently CP free.  TTE originally scheduled to be done in December 2023.  We will move this up given his symptoms.  We will evaluate EF with echocardiogram, and if reduced consider low dose ACE/ARB/ARNI.  Continue aspirin, metoprolol tartrate, rosuvastatin, and amlodipine. Heart healthy diet and regular cardiovascular exercise as tolerated encouraged.  ED precautions discussed.  We will obtain the following blood work today: CMET, CBC, and lipid panel.  Hyperlipidemia Most recent lipid profile on record includes total cholesterol 160, HDL 45, LDL 83, triglycerides 158, and VLDL 32.  He is due for labs to be drawn.  He is fasting today therefore we will draw CMET and fasting lipid panel. Heart healthy diet and regular cardiovascular exercise encouraged.  Continue rosuvastatin.  Hypertension Initial blood pressure on arrival 168/92, repeat BP 145/99.  We will draw CMET and if kidney function is WNL, plan to initiate low-dose ACE/ARB/ARNI if EF is reduced. Discussed to monitor BP at home at least 2 hours after medications and sitting for 5-10 minutes.  Continue amlodipine and metoprolol tartrate. Heart healthy diet and regular cardiovascular exercise encouraged.  Aortic stenosis, bicuspid aortic valve, status post TAVR with bioprosthetic valve in 2019 TTE in 2021 revealed EF 50 to 55%, no RWMA, normal structure and function of aortic valve prosthesis.  He was scheduled to have update TTE next month.  We will move this up to be done  within the next week or two.  He understands about SBE prophylaxis prior to any future dental procedure.  He has amoxicillin listed on his medication list.  Continue aspirin. Heart healthy diet and regular cardiovascular exercise encouraged.   Obesity BMI today 33.25. Weight loss via diet and exercise encouraged. Discussed the impact being overweight would have on cardiovascular risk. Heart healthy diet and regular cardiovascular exercise encouraged.   Headache Continues to be chronic medical problem for patient, denies any atypical symptoms or red flag symptoms.  Recommended he follow-up with PCP regarding this and told him we would avoid nitrates as this can exacerbate headache. ED precautions discussed.   7. GERD Admits to reflex, and upper epigastric pain. Will initiate Pantoprazole 40 mg BID x 2 weeks, then reduce to 40 mg daily.   Language barrier Difficulty with obtaining some components of HPI d/t language barrier.  Medical interpreter was available for today's visit.   Disposition: Follow-up in 3 weeks with Eula Listen, PA-C or another APP or sooner if anything changes.     Medication Adjustments/Labs and Tests Ordered: Current medicines are reviewed at length with the patient today.  Concerns regarding medicines are outlined above.  Orders Placed This Encounter  Procedures   CBC   Comprehensive metabolic panel   Lipid panel   Meds ordered this encounter  Medications   pantoprazole (PROTONIX) 40 MG tablet    Sig: Take 1 tablet (40 mg total) by mouth daily.    Dispense:  90 tablet    Refill:  3    Patient Instructions  Medication Instructions:  START Pantoprazole (Protonix) 40 mg twice daily for two weeks and then 40 mg once daily after that.  *If you need a refill on your cardiac medications before your next appointment, please call your pharmacy*   Lab Work: Your provider would like for you to have the following labs drawn: Fasting Lipid, CBC and CMET.   Please go  to the [facility] entrance and check in at the front desk.  You do not need an appointment.  They are open from 7am-6 pm.  You will need to be fasting.  If you have labs (blood work) drawn today and your tests are completely normal, you will receive your results only by: MyChart Message (if you have MyChart) OR A paper copy in the mail If you have any lab test that is abnormal or we need to change your treatment, we will call you to review the results.   Testing/Procedures: Echo to be moved up within the next two weeks   Follow-Up: At [facility], you and your health needs are our priority.  As part of our continuing mission to provide you with exceptional heart care, we have created designated Provider Care Teams.  These Care Teams include your primary Cardiologist (physician) and Advanced Practice Providers (APPs -  Physician Assistants and Nurse Practitioners) who all work together to provide you with the care you need, when you need it.  We recommend signing up for the patient portal called "MyChart".  Sign up information is provided on this After Visit Summary.  MyChart is used to connect with patients for Virtual Visits (Telemedicine).  Patients are able to view lab/test results, encounter notes, upcoming appointments, etc.  Non-urgent messages can be sent to your provider as well.   To learn more about what you can do with MyChart, go to [email or web address].    Your next appointment:   Follow up in 3 weeks with Billey Gosling, PA  Important Information About Sugar         Signed, Sharlene Dory, NP  04/11/2022 12:59 PM    [facility]

## 2022-04-11 ENCOUNTER — Ambulatory Visit: Payer: Medicare Other | Attending: Physician Assistant | Admitting: Nurse Practitioner

## 2022-04-11 ENCOUNTER — Encounter: Payer: Self-pay | Admitting: Physician Assistant

## 2022-04-11 ENCOUNTER — Other Ambulatory Visit: Payer: Self-pay | Admitting: Physician Assistant

## 2022-04-11 ENCOUNTER — Other Ambulatory Visit
Admission: RE | Admit: 2022-04-11 | Discharge: 2022-04-11 | Disposition: A | Discharge status: Home / Self Care | Payer: Medicare Other | Source: Ambulatory Visit | Attending: Nurse Practitioner | Admitting: Nurse Practitioner

## 2022-04-11 VITALS — BP 145/99 | HR 74 | Ht 65.0 in | Wt 199.8 lb

## 2022-04-11 DIAGNOSIS — E785 Hyperlipidemia, unspecified: Secondary | ICD-10-CM | POA: Diagnosis not present

## 2022-04-11 DIAGNOSIS — R519 Headache, unspecified: Secondary | ICD-10-CM | POA: Diagnosis present

## 2022-04-11 DIAGNOSIS — I10 Essential (primary) hypertension: Secondary | ICD-10-CM | POA: Insufficient documentation

## 2022-04-11 DIAGNOSIS — Z6833 Body mass index (BMI) 33.0-33.9, adult: Secondary | ICD-10-CM | POA: Diagnosis present

## 2022-04-11 DIAGNOSIS — I2510 Atherosclerotic heart disease of native coronary artery without angina pectoris: Secondary | ICD-10-CM

## 2022-04-11 DIAGNOSIS — G8929 Other chronic pain: Secondary | ICD-10-CM | POA: Insufficient documentation

## 2022-04-11 DIAGNOSIS — Z789 Other specified health status: Secondary | ICD-10-CM | POA: Insufficient documentation

## 2022-04-11 DIAGNOSIS — E6609 Other obesity due to excess calories: Secondary | ICD-10-CM | POA: Insufficient documentation

## 2022-04-11 DIAGNOSIS — I25118 Atherosclerotic heart disease of native coronary artery with other forms of angina pectoris: Secondary | ICD-10-CM

## 2022-04-11 DIAGNOSIS — I350 Nonrheumatic aortic (valve) stenosis: Secondary | ICD-10-CM | POA: Diagnosis not present

## 2022-04-11 DIAGNOSIS — Z952 Presence of prosthetic heart valve: Secondary | ICD-10-CM | POA: Insufficient documentation

## 2022-04-11 DIAGNOSIS — K219 Gastro-esophageal reflux disease without esophagitis: Secondary | ICD-10-CM | POA: Insufficient documentation

## 2022-04-11 DIAGNOSIS — R079 Chest pain, unspecified: Secondary | ICD-10-CM

## 2022-04-11 DIAGNOSIS — I359 Nonrheumatic aortic valve disorder, unspecified: Secondary | ICD-10-CM

## 2022-04-11 DIAGNOSIS — Z951 Presence of aortocoronary bypass graft: Secondary | ICD-10-CM

## 2022-04-11 LAB — COMPREHENSIVE METABOLIC PANEL
ALT: 19 U/L (ref 0–44)
AST: 19 U/L (ref 15–41)
Albumin: 4.1 g/dL (ref 3.5–5.0)
Alkaline Phosphatase: 87 U/L (ref 38–126)
Anion gap: 8 (ref 5–15)
BUN: 17 mg/dL (ref 8–23)
CO2: 27 mmol/L (ref 22–32)
Calcium: 8.7 mg/dL — ABNORMAL LOW (ref 8.9–10.3)
Chloride: 106 mmol/L (ref 98–111)
Creatinine, Ser: 1.03 mg/dL (ref 0.61–1.24)
GFR, Estimated: >60 mL/min (ref >60)
Glucose, Bld: 117 mg/dL — ABNORMAL HIGH (ref 70–99)
Potassium: 4 mmol/L (ref 3.5–5.1)
Sodium: 141 mmol/L (ref 135–145)
Total Bilirubin: 0.9 mg/dL (ref 0.3–1.2)
Total Protein: 7.4 g/dL (ref 6.5–8.1)

## 2022-04-11 LAB — CBC
HCT: 48.1 % (ref 39.0–52.0)
Hemoglobin: 16.3 g/dL (ref 13.0–17.0)
MCH: 29.7 pg (ref 26.0–34.0)
MCHC: 33.9 g/dL (ref 30.0–36.0)
MCV: 87.6 fL (ref 80.0–100.0)
Platelets: 178 10*3/uL (ref 150–400)
RBC: 5.49 MIL/uL (ref 4.22–5.81)
RDW: 13.1 % (ref 11.5–15.5)
WBC: 8.2 10*3/uL (ref 4.0–10.5)
nRBC: 0 % (ref 0.0–0.2)

## 2022-04-11 LAB — LIPID PANEL
Cholesterol: 223 mg/dL — ABNORMAL HIGH (ref 0–200)
HDL: 42 mg/dL (ref >40)
LDL Cholesterol: 126 mg/dL — ABNORMAL HIGH (ref 0–99)
Total CHOL/HDL Ratio: 5.3 RATIO
Triglycerides: 274 mg/dL — ABNORMAL HIGH (ref <150)
VLDL: 55 mg/dL — ABNORMAL HIGH (ref 0–40)

## 2022-04-11 MED ORDER — PANTOPRAZOLE SODIUM 40 MG PO TBEC: 40.0000 mg | DELAYED_RELEASE_TABLET | Freq: Every day | ORAL | 3 refills | Status: DC

## 2022-04-11 NOTE — Patient Instructions (Addendum)
Medication Instructions:  START Pantoprazole (Protonix) 40 mg twice daily for two weeks and then 40 mg once daily after that.  *If you need a refill on your cardiac medications before your next appointment, please call your pharmacy*   Lab Work: Your provider would like for you to have the following labs drawn: Fasting Lipid, CBC and CMET.   Please go to the [facility] entrance and check in at the front desk.  You do not need an appointment.  They are open from 7am-6 pm.  You will need to be fasting.  If you have labs (blood work) drawn today and your tests are completely normal, you will receive your results only by: MyChart Message (if you have MyChart) OR A paper copy in the mail If you have any lab test that is abnormal or we need to change your treatment, we will call you to review the results.   Testing/Procedures: Echo to be moved up within the next two weeks   Follow-Up: At [facility], you and your health needs are our priority.  As part of our continuing mission to provide you with exceptional heart care, we have created designated Provider Care Teams.  These Care Teams include your primary Cardiologist (physician) and Advanced Practice Providers (APPs -  Physician Assistants and Nurse Practitioners) who all work together to provide you with the care you need, when you need it.  We recommend signing up for the patient portal called "MyChart".  Sign up information is provided on this After Visit Summary.  MyChart is used to connect with patients for Virtual Visits (Telemedicine).  Patients are able to view lab/test results, encounter notes, upcoming appointments, etc.  Non-urgent messages can be sent to your provider as well.   To learn more about what you can do with MyChart, go to [email or web address].    Your next appointment:   Follow up in 3 weeks with Ran Dunn, PA  Important Information About Sugar

## 2022-04-18 ENCOUNTER — Ambulatory Visit: Payer: Medicare Other | Attending: Nurse Practitioner

## 2022-04-18 DIAGNOSIS — I359 Nonrheumatic aortic valve disorder, unspecified: Secondary | ICD-10-CM | POA: Insufficient documentation

## 2022-04-18 DIAGNOSIS — I25118 Atherosclerotic heart disease of native coronary artery with other forms of angina pectoris: Secondary | ICD-10-CM | POA: Diagnosis present

## 2022-04-18 DIAGNOSIS — R079 Chest pain, unspecified: Secondary | ICD-10-CM | POA: Insufficient documentation

## 2022-04-18 LAB — ECHOCARDIOGRAM COMPLETE
AR max vel: 1.23 cm2
AV Area VTI: 1.37 cm2
AV Area mean vel: 1.4 cm2
AV Mean grad: 11 mmHg
AV Peak grad: 20.9 mmHg
Ao pk vel: 2.29 m/s
Area-P 1/2: 3.6 cm2
Calc EF: 57.2 %
MV M vel: 3.42 m/s
MV Peak grad: 46.8 mmHg
S' Lateral: 2.8 cm
Single Plane A2C EF: 56.6 %
Single Plane A4C EF: 59 %

## 2022-04-29 ENCOUNTER — Telehealth: Payer: Self-pay

## 2022-04-29 DIAGNOSIS — I10 Essential (primary) hypertension: Secondary | ICD-10-CM

## 2022-04-29 DIAGNOSIS — E785 Hyperlipidemia, unspecified: Secondary | ICD-10-CM

## 2022-04-29 MED ORDER — ROSUVASTATIN CALCIUM 20 MG PO TABS: 20.0000 mg | ORAL_TABLET | Freq: Every day | ORAL | 3 refills | Status: DC

## 2022-04-29 MED ORDER — LOSARTAN POTASSIUM 25 MG PO TABS: 25.0000 mg | ORAL_TABLET | Freq: Every day | ORAL | 3 refills | Status: DC

## 2022-04-29 NOTE — Progress Notes (Unsigned)
Cardiology Office Note    Date:  05/03/2022   ID:  James Yates, DOB 05-27-1956, MRN [id number]  PCP:  Glori Luis, MD  Cardiologist:  Lorine Bears, MD  Electrophysiologist:  None   Chief Complaint: Follow-up  History of Present Illness:   James Yates is a 66 y.o. male with history of CAD s/p CABG in 06/2013 following a NSTEMI, bicuspid aortic valve with severe stenosis s/p TAVR in 02/2018, paroxysmal SVT, HTN, HLD, and RBBB who presents for follow up of CAD.   LHC in 10/2015 showed severe underlying 3-vessel CAD with a patent LIMA-LAD, and SVG-RCA. The SVG-LCx was occluded. The native LCx was occluded proximally with faint collaterals. There was moderate aortic stenosis with a peak to peak gradient of 22 mmHg. LHC was reviewed with our CTO team with the opinion being the LCx was not optimal for PCI. Echo in 09/2017 showed an EF of 60-65%, normal wall motion, progression of his aortic valve stenosis to severe with a peak velocity of 422 cm/s, mean gradient of 38 mmHg, and apeak gradient of 71 mmHg, left atrium was normal in size, RVSF normal, PASP 46 mmHg. He was seen on 11/23/2017 with worsening exertional chest tightness. He underwent R/LHC on 11/27/2017 that showed significant underlying 3-vessel CAD with a patent LIMA to LAD (could not be selectively engaged), patent SVG to RCA and known occluded SVG to OM3 along with an occluded LCx. There was moderate to severe aortic stenosis with a mean gradient of 26 mmHg and a valve area of 1.2. However, his aortic stenosis was felt to be likely severe based on echo findings and his valve area noted on 11/27/2017 was felt to be overestimated due to high cardiac output. Valve area of echo noted to be 0.6. The aortic valve morphology was also consistent with severe aortic stenosis. RHC showed mildly elevated filling pressures with mild pulmonary hypertension. He subsequently underwent TAVR in 02/2018. Echo from 02/2019 showed an EF of 60-65%, normal  RVSF and ventricular cavity size, normal PASP, and a normal functioning bioprosthetic aortic valve with a mean gradient of 10 mmHg.  He underwent Zio in 2021 patch monitoring which showed a predominant rhythm of sinus with an average heart rate of 89 bpm, IVCD was noted, 10 episodes of SVT with the longest episode lasting 16 beats with an average heart rate of 152 bpm, rare PACs and PVCs.  Some patient triggered events correlated with PVCs and sinus tachycardia.  Echo in 03/2020 showed an EF of 50 to 55%, no regional wall motion abnormalities, normal LV diastolic function parameters, normal RV systolic function and ventricular cavity size, and normal structure and function of aortic valve prosthesis.     He was seen in the office in 05/2021 and had lost his health insurance briefly.  In this setting, he had ran out of medications.  Given this, he was not taking his antihypertensive medications and reported worsening chest pain and shortness of breath.  Symptoms were felt to be in the setting of being off antianginal medications.  At time of his visit, he was back under health insurance.  He was seen on 02/02/2022 continuing to note some exertional chest discomfort that was consistent what he was feeling at his visit in early 2023.  He also continues to note unchanged and longstanding palpitations.  Lexiscan MPI on 02/10/2022 was overall low risk with no significant ischemia with a small region of fixed defect in the apical, distal anterior apical, and inferior apical  region and an EF of 46%.  CT attenuated corrected images showed a mild aortic atherosclerosis, moderate coronary artery calcification, and TAVR valve.  He was last seen in the office on 04/11/2022 noting stable chest discomfort when compared to prior visits.  Echo on 04/18/2022 demonstrated an EF of 55 to 60%, no regional wall motion abnormalities, indeterminate LV diastolic function parameters, low normal RV systolic function with normal ventricular  cavity size, normal structure and function of aortic valve prosthesis, and an estimated right atrial pressure of 3 mmHg.  He comes in today noting several complaints.  He continues to have bilateral shoulder discomfort that is not exacerbated by exertion and only noticeable when laying on his side at nighttime.  Pain does not feel similar to his prior angina.  There is no associated dyspnea, diaphoresis, nausea, or vomiting.  He is without frank chest pain.  No dizziness, presyncope, or syncope.  No falls, hematochezia, or melena.  He notes a longstanding history of intermittent bleeding from his mouth.  He reports he has been evaluated by the dentist previously and they told him there were no acute issues.  Further details of this are unclear.  He continues to have an unchanged longstanding headache.  He has tolerated the addition of losartan and titration of rosuvastatin without issue.  Currently, he is without symptoms of chest pain or shoulder pain.   Labs independently reviewed: 03/2022 - Hgb 16.3, PLT 178, potassium 4.0, BUN 17, serum creatinine 1.03, albumin 4.1, AST/ALT normal, TC 223, TG 274, HDL 42, LDL 126 06/2020 - magnesium 2.0   Past Medical History:  Diagnosis Date   Coronary artery disease    s/p CABG   Glucose intolerance (impaired glucose tolerance)    Headache    chronic, no red flag or atypical signs or symptoms   Hyperlipidemia    Hypertension    Obesity    S/P TAVR (transcatheter aortic valve replacement)    26 mm Edwards Sapien 3 transcatheter heart valve placed via percutaneous right transfemoral approach    Severe aortic stenosis     Past Surgical History:  Procedure Laterality Date   CARDIAC CATHETERIZATION  06/2013   [facility]   CARDIAC CATHETERIZATION N/A 10/29/2015   Procedure: Left Heart Cath and Cors/Grafts Angiography;  Surgeon: Iran Ouch, MD;  Location: [facility] INVASIVE CV LAB;  Service: Cardiovascular;  Laterality: N/A;   CORONARY ARTERY BYPASS GRAFT N/A  07/05/2013   Procedure: CORONARY ARTERY BYPASS GRAFTING (CABG);  Surgeon: [name], MD;  Location: [facility] OR;  Service: Open Heart Surgery;  Laterality: N/A;   INTRAOPERATIVE TRANSESOPHAGEAL ECHOCARDIOGRAM N/A 07/05/2013   Procedure: INTRAOPERATIVE TRANSESOPHAGEAL ECHOCARDIOGRAM;  Surgeon: [name], MD;  Location: [facility] OR;  Service: Open Heart Surgery;  Laterality: N/A;   INTRAOPERATIVE TRANSTHORACIC ECHOCARDIOGRAM N/A 03/13/2018   Procedure: INTRAOPERATIVE TRANSTHORACIC ECHOCARDIOGRAM;  Surgeon: Kathleene Hazel, MD;  Location: [facility] OR;  Service: Open Heart Surgery;  Laterality: N/A;   NO PAST SURGERIES     RIGHT/LEFT HEART CATH AND CORONARY ANGIOGRAPHY Bilateral 11/27/2017   Procedure: RIGHT/LEFT HEART CATH AND CORONARY ANGIOGRAPHY;  Surgeon: Iran Ouch, MD;  Location: [facility] INVASIVE CV LAB;  Service: Cardiovascular;  Laterality: Bilateral;   TRANSCATHETER AORTIC VALVE REPLACEMENT, TRANSFEMORAL N/A 03/13/2018   Procedure: TRANSCATHETER AORTIC VALVE REPLACEMENT, TRANSFEMORAL. Edwards Sapien 3 Transcatheter Heart Valve size 73mm.;  Surgeon: Kathleene Hazel, MD;  Location: [facility] OR;  Service: Open Heart Surgery;  Laterality: N/A;    Current Medications: Current Meds  Medication  Sig   Acetaminophen (TYLENOL PO) Take by mouth as needed.   amitriptyline (ELAVIL) 10 MG tablet Take 10 mg by mouth at bedtime.   amoxicillin (AMOXIL) 500 MG capsule TAKE 4 CAPSULES BY MOUTH ONCE FOR 1 DOSE. TO BE TAKEN 30 60 MIN PRIOR TO DENTAL PROCEDURE.   aspirin EC 81 MG tablet Take 1 tablet (81 mg total) by mouth daily.   losartan (COZAAR) 50 MG tablet Take 1 tablet (50 mg total) by mouth daily.   metoprolol tartrate (LOPRESSOR) 100 MG tablet Take 1 tablet (100 mg total) by mouth 2 (two) times daily.   pantoprazole (PROTONIX) 40 MG tablet Take 1 tablet (40 mg total) by mouth daily.   ranolazine (RANEXA) 500 MG 12 hr tablet Take 1 tablet (500 mg total) by mouth 2 (two) times daily.   rosuvastatin  (CRESTOR) 20 MG tablet Take 1 tablet (20 mg total) by mouth daily.   [DISCONTINUED] amLODipine (NORVASC) 10 MG tablet TOME UNA TABLETA TODOS LOS DIAS   [DISCONTINUED] losartan (COZAAR) 25 MG tablet Take 1 tablet (25 mg total) by mouth daily.    Allergies:   Patient has no known allergies.   Social History   Socioeconomic History   Marital status: Married    Spouse name: Not on file   Number of children: Not on file   Years of education: Not on file   Highest education level: Not on file  Occupational History   Not on file  Tobacco Use   Smoking status: Never   Smokeless tobacco: Never  Vaping Use   Vaping Use: Never used  Substance and Sexual Activity   Alcohol use: No   Drug use: No   Sexual activity: Not on file  Other Topics Concern   Not on file  Social History Narrative   Not on file   Social Determinants of Health   Financial Resource Strain: Not on file  Food Insecurity: Not on file  Transportation Needs: Not on file  Physical Activity: Not on file  Stress: Not on file  Social Connections: Not on file     Family History:  The patient's family history includes Heart attack in his father; Heart disease in his father.  ROS:   12-point review of systems is negative unless otherwise noted in the HPI.   EKGs/Labs/Other Studies Reviewed:    Studies reviewed were summarized above. The additional studies were reviewed today:  2D echo 04/18/2022: 1. Left ventricular ejection fraction, by estimation, is 55 to 60%. The  left ventricle has normal function. The left ventricle has no regional  wall motion abnormalities. Left ventricular diastolic parameters are  indeterminate. The average left  ventricular global longitudinal strain is -22.8 %. The global longitudinal  strain is normal.   2. Right ventricular systolic function is low normal. The right  ventricular size is normal.   3. The mitral valve is normal in structure. No evidence of mitral valve   regurgitation.   4. The aortic valve has been repaired/replaced. Aortic valve  regurgitation is not visualized. Echo findings are consistent with normal  structure and function of the aortic valve prosthesis. Aortic valve mean  gradient measures 11.0 mmHg.   5. The inferior vena cava is normal in size with greater than 50%  respiratory variability, suggesting right atrial pressure of 3 mmHg.   Comparison(s): Echocardiogram done 04/16/20 showed an EF of 50-55%.  __________  Eugenie Birks MPI 02/10/2022: Pharmacological myocardial perfusion imaging study with no significant  ischemia Small region fixed  defect in the apical, distal anterior apical, inferior apical region, unable to exclude small region prior MI Normal wall motion, EF estimated at 46% No EKG changes concerning for ischemia at peak stress or in recovery. CT attenuation correction images with mild aortic atherosclerosis and moderate coronary calcification, TAVR valve noted Low risk scan __________  Zio patch 03/2020: Normal sinus rhythm with an average heart rate 89 bpm.  IVCD. 10 episodes of supraventricular tachycardia.  The longest lasted 16 beats with an average heart rate of 152 bpm. Rare PACs and rare PVCs. Some triggered events correlated with PVCs and sinus tachycardia. __________   2D echo 03/2020: 1. Left ventricular ejection fraction, by estimation, is 50 to 55%. Left  ventricular ejection fraction by 3D volume is 53 %. The left ventricle has  low normal function. The left ventricle has no regional wall motion  abnormalities. Left ventricular  diastolic parameters were normal.   2. Right ventricular systolic function is normal. The right ventricular  size is normal.   3. The mitral valve is normal in structure. No evidence of mitral valve  regurgitation.   4. The aortic valve has been repaired/replaced. Aortic valve  regurgitation is not visualized. Echo findings are consistent with normal  structure and  function of the aortic valve prosthesis. ___________   See Epic for remaining cardiac studies   EKG:  EKG is not ordered today.    Recent Labs: 04/11/2022: ALT 19; BUN 17; Creatinine, Ser 1.03; Hemoglobin 16.3; Platelets 178; Potassium 4.0; Sodium 141  Recent Lipid Panel    Component Value Date/Time   CHOL 223 (H) 04/11/2022 1057   CHOL 193 03/31/2020 1019   CHOL 215 (H) 07/02/2013 0058   TRIG 274 (H) 04/11/2022 1057   TRIG 143 07/02/2013 0058   HDL 42 04/11/2022 1057   HDL 48 03/31/2020 1019   HDL 38 (L) 07/02/2013 0058   CHOLHDL 5.3 04/11/2022 1057   VLDL 55 (H) 04/11/2022 1057   VLDL 29 07/02/2013 0058   LDLCALC 126 (H) 04/11/2022 1057   LDLCALC 117 (H) 03/31/2020 1019   LDLCALC 148 (H) 07/02/2013 0058    PHYSICAL EXAM:    VS:  BP (!) 138/90 (BP Location: Left Arm, Patient Position: Sitting, Cuff Size: Normal)   Pulse 94   Ht 5\' 5"  (1.651 m)   Wt 194 lb 4 oz (88.1 kg)   SpO2 98%   BMI 32.32 kg/m   BMI: Body mass index is 32.32 kg/m.  Physical Exam Vitals reviewed.  Constitutional:      Appearance: He is well-developed.  HENT:     Head: Normocephalic and atraumatic.  Eyes:     General:        Right eye: No discharge.        Left eye: No discharge.  Neck:     Vascular: No JVD.  Cardiovascular:     Rate and Rhythm: Normal rate and regular rhythm.     Pulses:          Posterior tibial pulses are 2+ on the right side and 2+ on the left side.     Heart sounds: S1 normal and S2 normal. Heart sounds not distant. No midsystolic click and no opening snap. Murmur heard.     Systolic murmur is present with a grade of 1/6 at the upper right sternal border.     No friction rub.  Pulmonary:     Effort: Pulmonary effort is normal. No respiratory distress.     Breath  sounds: Normal breath sounds. No decreased breath sounds, wheezing or rales.  Chest:     Chest wall: No tenderness.  Abdominal:     General: There is no distension.  Musculoskeletal:     Cervical  back: Normal range of motion.     Right lower leg: No edema.     Left lower leg: No edema.  Skin:    General: Skin is warm and dry.     Nails: There is no clubbing.  Neurological:     Mental Status: He is alert and oriented to person, place, and time.  Psychiatric:        Speech: Speech normal.        Behavior: Behavior normal.        Thought Content: Thought content normal.        Judgment: Judgment normal.     Wt Readings from Last 3 Encounters:  05/03/22 194 lb 4 oz (88.1 kg)  04/11/22 199 lb 12.8 oz (90.6 kg)  02/02/22 195 lb 6 oz (88.6 kg)     ASSESSMENT & PLAN:   CAD status post CABG with chronic stable angina: Currently without symptoms of angina.  His bilateral shoulder discomfort is chronic and appears atypical.  It is not consistent with his prior angina.  Recent Lexiscan MPI showed no evidence of ischemia.  We did discuss escalation of antianginal therapy versus proceeding with diagnostic cardiac cath.  He would like to initiate medical therapy at this time in place of cardiac cath.  Given his chronic headache I do not think addition of isosorbide mononitrate is a good option at this time.  Add ranolazine 500 mg twice daily with continuation of Lopressor.  We are undergoing a trial of discontinuing amlodipine given possible gingival bleeding as outlined below.  Continue aggressive risk factor modification and secondary prevention.    History of TAVR: He remains on aspirin.  Recent echo demonstrated normal device function.  Has amoxicillin for SBE prophylaxis.  HTN: Blood pressure remains mildly elevated.  With the discontinuation of amlodipine as outlined below to see if this improves his gingival bleeding, we will titrate losartan to 50 mg daily.  Otherwise, continue Lopressor 100 mg twice daily.  HLD: LDL 126 in 03/2022 with normal AST/ALT at that time.  Now on titrated dose of rosuvastatin 20 mg.  Future orders for fasting lipid panel and LFT have been placed.  Language  barrier: [facility] medical interpreter utilized for today's visit.  Headache: Longstanding issue.  No red flag or atypical symptoms.  Recommend he follow-up with PCP.  Gingival bleeding: No current bleeding.  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  Given this, we will undergo a trial of holding amlodipine.  Should bleeding persist, he will need to follow-up with his dentist for further evaluation.    Disposition: F/u with Dr. Kirke Corin in 1 month.   Medication Adjustments/Labs and Tests Ordered: Current medicines are reviewed at length with the patient today.  Concerns regarding medicines are outlined above. Medication changes, Labs and Tests ordered today are summarized above and listed in the Patient Instructions accessible in Encounters.   Signed, Eula Listen, PA-C 05/03/2022 11:58 AM     [facility] [place] [address] [place], [place] [address] [phone]

## 2022-04-29 NOTE — Telephone Encounter (Addendum)
Called patient regarding results. Patient had understanding of results. Patient advised to increase Crestor to 20 mg Daily. And to Start Losartan 25 mg Daily. Patient advised to have BMET in 2 weeks and Lipids and LFT's in 2 months. Lab orders mailed 12/1----- Message from Dorris Fetch, CMA sent at 04/29/2022  2:34 PM EST ----- Can you please give this patient a call and give him the below information.  Thanks,  Gaspar Cola  ----- Message ----- From: Sharlene Dory, NP Sent: 04/13/2022   9:39 AM EST To: Dorris Fetch, CMA  Will need help with translation to update patient about results, pt speaks Spanish.  I need to increase his statin medication as his LDL is 126.  Lets increase Crestor to 20 mg daily.  Recheck FLP and LFT in 2 months.  Kidney function is excellent.  Let's start him on Losartan 25 mg daily for better blood pressure control and get another BMET in 2 weeks.   Thanks so much!   Sharlene Dory, AGNP-C

## 2022-05-03 ENCOUNTER — Ambulatory Visit: Payer: Medicare Other | Attending: Physician Assistant | Admitting: Physician Assistant

## 2022-05-03 ENCOUNTER — Encounter: Payer: Self-pay | Admitting: Physician Assistant

## 2022-05-03 VITALS — BP 138/90 | HR 94 | Ht 65.0 in | Wt 194.2 lb

## 2022-05-03 DIAGNOSIS — I350 Nonrheumatic aortic (valve) stenosis: Secondary | ICD-10-CM | POA: Diagnosis present

## 2022-05-03 DIAGNOSIS — I10 Essential (primary) hypertension: Secondary | ICD-10-CM | POA: Insufficient documentation

## 2022-05-03 DIAGNOSIS — Z789 Other specified health status: Secondary | ICD-10-CM | POA: Diagnosis present

## 2022-05-03 DIAGNOSIS — E785 Hyperlipidemia, unspecified: Secondary | ICD-10-CM | POA: Diagnosis present

## 2022-05-03 DIAGNOSIS — I25118 Atherosclerotic heart disease of native coronary artery with other forms of angina pectoris: Secondary | ICD-10-CM | POA: Insufficient documentation

## 2022-05-03 DIAGNOSIS — G8929 Other chronic pain: Secondary | ICD-10-CM | POA: Insufficient documentation

## 2022-05-03 DIAGNOSIS — Z952 Presence of prosthetic heart valve: Secondary | ICD-10-CM | POA: Diagnosis present

## 2022-05-03 DIAGNOSIS — R519 Headache, unspecified: Secondary | ICD-10-CM | POA: Diagnosis present

## 2022-05-03 DIAGNOSIS — I2510 Atherosclerotic heart disease of native coronary artery without angina pectoris: Secondary | ICD-10-CM

## 2022-05-03 DIAGNOSIS — Z951 Presence of aortocoronary bypass graft: Secondary | ICD-10-CM | POA: Insufficient documentation

## 2022-05-03 DIAGNOSIS — K068 Other specified disorders of gingiva and edentulous alveolar ridge: Secondary | ICD-10-CM | POA: Insufficient documentation

## 2022-05-03 MED ORDER — RANOLAZINE ER 500 MG PO TB12: 500.0000 mg | ORAL_TABLET | Freq: Two times a day (BID) | ORAL | 3 refills | Status: DC

## 2022-05-03 MED ORDER — LOSARTAN POTASSIUM 50 MG PO TABS: 50.0000 mg | ORAL_TABLET | Freq: Every day | ORAL | 3 refills | Status: DC

## 2022-05-03 NOTE — Patient Instructions (Addendum)
Medication Instructions:  Your physician has recommended you make the following change in your medication:   START Ranexa 500 mg twice a day STOP Amlodipine INCREASE Losartan to 50 mg once daily  *If you need a refill on your cardiac medications before your next appointment, please call your pharmacy*   Lab Work: None  If you have labs (blood work) drawn today and your tests are completely normal, you will receive your results only by: MyChart Message (if you have MyChart) OR A paper copy in the mail If you have any lab test that is abnormal or we need to change your treatment, we will call you to review the results.   Testing/Procedures: None   Follow-Up: At [facility], you and your health needs are our priority.  As part of our continuing mission to provide you with exceptional heart care, we have created designated Provider Care Teams.  These Care Teams include your primary Cardiologist (physician) and Advanced Practice Providers (APPs -  Physician Assistants and Nurse Practitioners) who all work together to provide you with the care you need, when you need it.  We recommend signing up for the patient portal called "MyChart".  Sign up information is provided on this After Visit Summary.  MyChart is used to connect with patients for Virtual Visits (Telemedicine).  Patients are able to view lab/test results, encounter notes, upcoming appointments, etc.  Non-urgent messages can be sent to your provider as well.   To learn more about what you can do with MyChart, go to [email or web address].    Your next appointment:   1 month(s)  The format for your next appointment:   In Person  Provider:   Lorine Bears, MD or Eula Listen, PA-C       Important Information About Sugar

## 2022-05-18 ENCOUNTER — Other Ambulatory Visit: Payer: Medicare Other

## 2022-05-18 LAB — BASIC METABOLIC PANEL
BUN/Creatinine Ratio: 12 (ref 10–24)
BUN: 12 mg/dL (ref 8–27)
CO2: 28 mmol/L (ref 20–29)
Calcium: 9.1 mg/dL (ref 8.6–10.2)
Chloride: 103 mmol/L (ref 96–106)
Creatinine, Ser: 0.98 mg/dL (ref 0.76–1.27)
Glucose: 114 mg/dL — ABNORMAL HIGH (ref 70–99)
Potassium: 3.3 mmol/L — ABNORMAL LOW (ref 3.5–5.2)
Sodium: 145 mmol/L — ABNORMAL HIGH (ref 134–144)
eGFR: 86 mL/min/{1.73_m2} (ref >59)

## 2022-06-02 NOTE — Progress Notes (Signed)
Cardiology Office Note:    Date:  06/03/2022   ID:  James Yates, DOB 25-Feb-1956, MRN [id number]  PCP:  James Luis, MD    [facility] Providers Cardiologist:  James Bears, MD     Referring MD: James Luis, MD   Chief Complaint  Patient presents with   Follow-up    1 month follow up visit. Meds reviewed with patient.     History of Present Illness:    James Yates is a 67 y.o. male with a hx of  CAD s/p CABG in 06/2013 following a NSTEMI, bicuspid aortic valve with severe stenosis s/p TAVR in 02/2018, paroxysmal SVT, HTN, HLD, and RBBB.  LHC in 10/2015 showed severe underlying 3-vessel CAD with a patent LIMA-LAD, and SVG-RCA. The SVG-LCx was occluded. The native LCx was occluded proximally with faint collaterals. There was moderate aortic stenosis with a peak to peak gradient of 22 mmHg. LHC was reviewed with our CTO team with the opinion being the LCx was not optimal for PCI.   Echo in 09/2017 showed an EF of 60-65%, normal wall motion, progression of his aortic valve stenosis to severe with a peak velocity of 422 cm/s, mean gradient of 38 mmHg, and apeak gradient of 71 mmHg, left atrium was normal in size, RVSF normal, PASP 46 mmHg. He was seen on 11/23/2017 with worsening exertional chest tightness. He underwent R/LHC on 11/27/2017 that showed significant underlying 3-vessel CAD with a patent LIMA to LAD (could not be selectively engaged), patent SVG to RCA and known occluded SVG to OM3 along with an occluded LCx. There was moderate to severe aortic stenosis with a mean gradient of 26 mmHg and a valve area of 1.2. However, his aortic stenosis was felt to be likely severe based on echo findings and his valve area noted on 11/27/2017 was felt to be overestimated due to high cardiac output. Valve area of echo noted to be 0.6. The aortic valve morphology was also consistent with severe aortic stenosis. RHC showed mildly elevated filling pressures with mild pulmonary  hypertension. He subsequently underwent TAVR in 02/2018.   He was seen on 02/02/2022 continuing to note some exertional chest discomfort that was consistent what he was feeling at his visit in early 2023.  He also continues to note unchanged and longstanding palpitations.  Lexiscan MPI on 02/10/2022 was overall low risk with no significant ischemia with a small region of fixed defect in the apical, distal anterior apical, and inferior apical region and an EF of 46%.   Last seen in our office on 05/03/22 by James Listen PA. At that time, he has various concerns related to ongoing shoulder pain, HA, and intermittent bleeding from his gum line. Amlodipine was d/c'd to see if this would help with his gingival bleeding, and losartan was titrated up to 50 mg once daily.   He presents today for a follow up of his HTN and angina, an in person interpreter was present to assist with interpreting. He states he is no better and no worse than when he was previously in office. He was not able to afford the Ranexa. He continues to endorse chest pain and SOB with exercise (walking > 20 minutes). Chest pain is relieved with rest.He endorse occasional dizziness with positional changes, and occasional palpitations.  He denies dyspnea, pnd, orthopnea, n, v, syncope, edema, weight gain, or early satiety. His BP is elevated in the office today, he has not taken his morning medications. He does occasionally check  his BP and states that it is "ok, 124".      Past Medical History:  Diagnosis Date   Coronary artery disease    s/p CABG   Glucose intolerance (impaired glucose tolerance)    Headache    chronic, no red flag or atypical signs or symptoms   Hyperlipidemia    Hypertension    Obesity    S/P TAVR (transcatheter aortic valve replacement)    26 mm Edwards Sapien 3 transcatheter heart valve placed via percutaneous right transfemoral approach    Severe aortic stenosis     Past Surgical History:  Procedure Laterality  Date   CARDIAC CATHETERIZATION  06/2013   [facility]   CARDIAC CATHETERIZATION N/A 10/29/2015   Procedure: Left Heart Cath and Cors/Grafts Angiography;  Surgeon: Iran Ouch, MD;  Location: [facility] INVASIVE CV LAB;  Service: Cardiovascular;  Laterality: N/A;   CORONARY ARTERY BYPASS GRAFT N/A 07/05/2013   Procedure: CORONARY ARTERY BYPASS GRAFTING (CABG);  Surgeon: [name], MD;  Location: [facility] OR;  Service: Open Heart Surgery;  Laterality: N/A;   INTRAOPERATIVE TRANSESOPHAGEAL ECHOCARDIOGRAM N/A 07/05/2013   Procedure: INTRAOPERATIVE TRANSESOPHAGEAL ECHOCARDIOGRAM;  Surgeon: [name], MD;  Location: [facility] OR;  Service: Open Heart Surgery;  Laterality: N/A;   INTRAOPERATIVE TRANSTHORACIC ECHOCARDIOGRAM N/A 03/13/2018   Procedure: INTRAOPERATIVE TRANSTHORACIC ECHOCARDIOGRAM;  Surgeon: Kathleene Hazel, MD;  Location: [facility] OR;  Service: Open Heart Surgery;  Laterality: N/A;   NO PAST SURGERIES     RIGHT/LEFT HEART CATH AND CORONARY ANGIOGRAPHY Bilateral 11/27/2017   Procedure: RIGHT/LEFT HEART CATH AND CORONARY ANGIOGRAPHY;  Surgeon: Iran Ouch, MD;  Location: [facility] INVASIVE CV LAB;  Service: Cardiovascular;  Laterality: Bilateral;   TRANSCATHETER AORTIC VALVE REPLACEMENT, TRANSFEMORAL N/A 03/13/2018   Procedure: TRANSCATHETER AORTIC VALVE REPLACEMENT, TRANSFEMORAL. Edwards Sapien 3 Transcatheter Heart Valve size 58mm.;  Surgeon: Kathleene Hazel, MD;  Location: [facility] OR;  Service: Open Heart Surgery;  Laterality: N/A;    Current Medications: Current Meds  Medication Sig   Acetaminophen (TYLENOL PO) Take by mouth as needed.   amitriptyline (ELAVIL) 10 MG tablet Take 10 mg by mouth at bedtime.   aspirin EC 81 MG tablet Take 1 tablet (81 mg total) by mouth daily.   isosorbide mononitrate (IMDUR) 30 MG 24 hr tablet Take 1 tablet (30 mg total) by mouth daily.   losartan (COZAAR) 50 MG tablet Take 1 tablet (50 mg total) by mouth daily.   metoprolol tartrate (LOPRESSOR) 100 MG tablet  Take 1 tablet (100 mg total) by mouth 2 (two) times daily.   pantoprazole (PROTONIX) 40 MG tablet Take 1 tablet (40 mg total) by mouth daily.   rosuvastatin (CRESTOR) 20 MG tablet Take 1 tablet (20 mg total) by mouth daily.   [DISCONTINUED] ranolazine (RANEXA) 500 MG 12 hr tablet Take 1 tablet (500 mg total) by mouth 2 (two) times daily.     Allergies:   Patient has no known allergies.   Social History   Socioeconomic History   Marital status: Married    Spouse name: Not on file   Number of children: Not on file   Years of education: Not on file   Highest education level: Not on file  Occupational History   Not on file  Tobacco Use   Smoking status: Never   Smokeless tobacco: Never  Vaping Use   Vaping Use: Never used  Substance and Sexual Activity   Alcohol use: No   Drug use: No   Sexual activity: Not  on file  Other Topics Concern   Not on file  Social History Narrative   Not on file   Social Determinants of Health   Financial Resource Strain: Not on file  Food Insecurity: Not on file  Transportation Needs: Not on file  Physical Activity: Not on file  Stress: Not on file  Social Connections: Not on file     Family History: The patient's family history includes Heart attack in his father; Heart disease in his father.  ROS:   Review of Systems  Constitutional: Negative.   HENT: Negative.    Eyes: Negative.   Respiratory:  Positive for shortness of breath (with exertion). Negative for cough, hemoptysis, sputum production and wheezing.   Cardiovascular:  Positive for chest pain (with exertion), palpitations (on occasion) and leg swelling (R leg). Negative for orthopnea, claudication and PND.  Gastrointestinal: Negative.   Genitourinary: Negative.   Musculoskeletal: Negative.   Skin: Negative.   Neurological:  Positive for dizziness (on occasion with positional changes) and headaches. Negative for loss of consciousness.  Endo/Heme/Allergies: Negative.    Psychiatric/Behavioral: Negative.       EKGs/Labs/Other Studies Reviewed:    The following studies were reviewed today:  04/18/22 echo complete - Left ventricular ejection fraction, by estimation, is 55 to 60%. The left ventricle has normal function. The left ventricle has no regional wall motion abnormalities. Echo findings are consistent with normal structure and function of the aortic valve prosthesis.   02/10/2022 Lexiscan - no significant  ischemia Small region fixed defect in the apical, distal anterior apical, inferior apical region, unable to exclude small region prior MI, Normal wall motion, EF estimated at 46%, No EKG changes concerning for ischemia at peak stress   03/2020 Zio patch - Normal sinus rhythm with an average heart rate 89 bpm.  IVCD. 10 episodes of supraventricular tachycardia.  The longest lasted 16 beats with an average heart rate of 152 bpm. Rare PACs and rare PVCs. Some triggered events correlated with PVCs and sinus tachycardia.  EKG:  EKG is not ordered today.   Recent Labs: 04/11/2022: ALT 19; Hemoglobin 16.3; Platelets 178 05/18/2022: BUN 12; Creatinine, Ser 0.98; Potassium 3.3; Sodium 145  Recent Lipid Panel    Component Value Date/Time   CHOL 223 (H) 04/11/2022 1057   CHOL 193 03/31/2020 1019   CHOL 215 (H) 07/02/2013 0058   TRIG 274 (H) 04/11/2022 1057   TRIG 143 07/02/2013 0058   HDL 42 04/11/2022 1057   HDL 48 03/31/2020 1019   HDL 38 (L) 07/02/2013 0058   CHOLHDL 5.3 04/11/2022 1057   VLDL 55 (H) 04/11/2022 1057   VLDL 29 07/02/2013 0058   LDLCALC 126 (H) 04/11/2022 1057   LDLCALC 117 (H) 03/31/2020 1019   LDLCALC 148 (H) 07/02/2013 0058     Risk Assessment/Calculations:      HYPERTENSION CONTROL Vitals:   06/03/22 0810 06/03/22 0834  BP: (!) 160/100 (!) 170/100    The patient's blood pressure is elevated above target today.  In order to address the patient's elevated BP: Blood pressure will be monitored at home to determine if  medication changes need to be made.; A new medication was prescribed today.            Physical Exam:    VS:  BP (!) 170/100 (BP Location: Left Arm, Patient Position: Sitting, Cuff Size: Large) Comment: repeat BP after 15 min  Pulse 91   Ht 5\' 5"  (1.651 m)   Wt 195 lb (88.5  kg)   SpO2 98%   BMI 32.45 kg/m     Wt Readings from Last 3 Encounters:  06/03/22 195 lb (88.5 kg)  05/03/22 194 lb 4 oz (88.1 kg)  04/11/22 199 lb 12.8 oz (90.6 kg)     GEN:  Well nourished, well developed in no acute distress HEENT: Normal NECK: No JVD; No carotid bruits LYMPHATICS: No lymphadenopathy CARDIAC: + murmur right sternal border, rubs, gallops RESPIRATORY:  Clear to auscultation without rales, wheezing or rhonchi  ABDOMEN: Soft, non-tender, non-distended MUSCULOSKELETAL:  No edema; No deformity  SKIN: Warm and dry NEUROLOGIC:  Alert and oriented x 3 PSYCHIATRIC:  Normal affect, appears tense   ASSESSMENT:    1. Coronary artery disease of native artery of native heart with stable angina pectoris ([place])   2. Essential hypertension   3. S/P CABG x 4   4. Hyperlipidemia, unspecified hyperlipidemia type   5. S/P TAVR (transcatheter aortic valve replacement)   6. Language barrier    PLAN:    In order of problems listed above:  CAD s/p CABG with chronic stable angina -  Continue ASA 81 mg daily. Endorses chest pain and SOB with exertion that is largely unchanged. He was unable to afford Ranexa. Discussed starting Imdur but we previously had not based on the potential of worsening headaches, he is agreeable to trying this. Start Imdur 30 mg daily, if headaches worsen and are intolerable he is advised he can stop taking. If symptoms persist in spite of optimization of antianginal, we may have to consider a repeat heart cath.  HTN - BP today 160/100 > 170/100 and is not controlled. He has not taken his morning medications yet. He sporadically checks his BP at home and says they are mostly "ok,  124". Reviewed proper BP measurement techniques, and requested that he check his BP daily x 2 weeks and call if they persist > 161 systolic after he has taken his medications. Continue metoprolol tartrate 100 mg daily, continue Cozaar 50 mg daily -- consider increasing dose if BP readings are still sub optimal, adding Imdur for angina. After initiation of Cozaar, repeat labs were mostly unremarkable, Na 145, K 3.3, will recheck BMET today.  HLD - LDL 126 on 04/11/22, needs better control,  Crestor was increased to 20 mg daily after last lipid check. Continue Crestor 20 mg daily. Will need LFTs at next visit.  Discussed dietary changes.  S/P TAVR - He understands about SBE prophylaxis prior to any future dental procedure. He has amoxicillin listed on his medication list.  Language barrier -[facility] medical interpreter utilized for today's visit        Medication Adjustments/Labs and Tests Ordered: Current medicines are reviewed at length with the patient today.  Concerns regarding medicines are outlined above.  Orders Placed This Encounter  Procedures   Basic metabolic panel   Meds ordered this encounter  Medications   isosorbide mononitrate (IMDUR) 30 MG 24 hr tablet    Sig: Take 1 tablet (30 mg total) by mouth daily.    Dispense:  90 tablet    Refill:  3    Patient Instructions  Please monitor blood pressures and keep a log of your readings.   Make sure to check 2 hours after your medications.   AVOID these things for 30 minutes before checking your blood pressure: No Drinking caffeine. No Drinking alcohol. No Eating. No Smoking. No Exercising.  Five minutes before checking your blood pressure: Pee. Sit in a dining  chair. Avoid sitting in a soft couch or armchair. Be quiet. Do not talk.   Medication Instructions:  Your physician has recommended you make the following change in your medication:   START Isosorbide mononitrate 30 mg once daily   *If you need a refill on  your cardiac medications before your next appointment, please call your pharmacy*   Lab Work: BMET today over at the [facility] entrance. Stop at registration desk to check in.   If you have labs (blood work) drawn today and your tests are completely normal, you will receive your results only by: MyChart Message (if you have MyChart) OR A paper copy in the mail If you have any lab test that is abnormal or we need to change your treatment, we will call you to review the results.   Testing/Procedures: None   Follow-Up: At [facility], you and your health needs are our priority.  As part of our continuing mission to provide you with exceptional heart care, we have created designated Provider Care Teams.  These Care Teams include your primary Cardiologist (physician) and Advanced Practice Providers (APPs -  Physician Assistants and Nurse Practitioners) who all work together to provide you with the care you need, when you need it.  We recommend signing up for the patient portal called "MyChart".  Sign up information is provided on this After Visit Summary.  MyChart is used to connect with patients for Virtual Visits (Telemedicine).  Patients are able to view lab/test results, encounter notes, upcoming appointments, etc.  Non-urgent messages can be sent to your provider as well.   To learn more about what you can do with MyChart, go to [email or web address].    Your next appointment:   2 month(s)  The format for your next appointment:   In Person  Provider:   Lorine Bears, MD or James Listen, PA-C       Important Information About Sugar         Signed, Flossie Dibble, NP  06/03/2022 9:53 AM    [facility]

## 2022-06-02 NOTE — Progress Notes (Deleted)
Cardiology Office Note:    Date:  06/02/2022   ID:  James Yates, DOB [birth year], MRN [id number]  PCP:  Leone Haven, MD   [facility] Providers Cardiologist:  [name], MD { Click to update primary MD,subspecialty MD or APP then REFRESH:1}    Referring MD: Leone Haven, MD   No chief complaint on file.   History of Present Illness:    James Yates is a 67 y.o. male with a hx of  CAD s/p CABG in 06/2013 following a NSTEMI, bicuspid aortic valve with severe stenosis s/p TAVR in 02/2018, paroxysmal SVT, HTN, HLD, and RBBB.  LHC in 10/2015 showed severe underlying 3-vessel CAD with a patent LIMA-LAD, and SVG-RCA. The SVG-LCx was occluded. The native LCx was occluded proximally with faint collaterals. There was moderate aortic stenosis with a peak to peak gradient of 22 mmHg. LHC was reviewed with our CTO team with the opinion being the LCx was not optimal for PCI.  He was seen on 02/02/2022 continuing to note some exertional chest discomfort that was consistent what he was feeling at his visit in early 2023.  He also continues to note unchanged and longstanding palpitations.  Lexiscan MPI on 02/10/2022 was overall low risk with no significant ischemia with a small region of fixed defect in the apical, distal anterior apical, and inferior apical region and an EF of 46%. CT attenuated corrected images showed a mild aortic atherosclerosis, moderate coronary artery calcification, and TAVR valve.     Past Medical History:  Diagnosis Date   Coronary artery disease    s/p CABG   Glucose intolerance (impaired glucose tolerance)    Headache    chronic, no red flag or atypical signs or symptoms   Hyperlipidemia    Hypertension    Obesity    S/P TAVR (transcatheter aortic valve replacement)    26 mm Edwards Sapien 3 transcatheter heart valve placed via percutaneous right transfemoral approach    Severe aortic stenosis     Past Surgical History:  Procedure  Laterality Date   CARDIAC CATHETERIZATION  06/2013   [facility]   CARDIAC CATHETERIZATION N/A 10/29/2015   Procedure: Left Heart Cath and Cors/Grafts Angiography;  Surgeon: Wellington Hampshire, MD;  Location: [place] CV LAB;  Service: Cardiovascular;  Laterality: N/A;   CORONARY ARTERY BYPASS GRAFT N/A 07/05/2013   Procedure: CORONARY ARTERY BYPASS GRAFTING (CABG);  Surgeon: Ivin Poot, MD;  Location: [place];  Service: Open Heart Surgery;  Laterality: N/A;   INTRAOPERATIVE TRANSESOPHAGEAL ECHOCARDIOGRAM N/A 07/05/2013   Procedure: INTRAOPERATIVE TRANSESOPHAGEAL ECHOCARDIOGRAM;  Surgeon: Ivin Poot, MD;  Location: [place];  Service: Open Heart Surgery;  Laterality: N/A;   INTRAOPERATIVE TRANSTHORACIC ECHOCARDIOGRAM N/A 03/13/2018   Procedure: INTRAOPERATIVE TRANSTHORACIC ECHOCARDIOGRAM;  Surgeon: Burnell Blanks, MD;  Location: [place];  Service: Open Heart Surgery;  Laterality: N/A;   NO PAST SURGERIES     RIGHT/LEFT HEART CATH AND CORONARY ANGIOGRAPHY Bilateral 11/27/2017   Procedure: RIGHT/LEFT HEART CATH AND CORONARY ANGIOGRAPHY;  Surgeon: Wellington Hampshire, MD;  Location: [place] CV LAB;  Service: Cardiovascular;  Laterality: Bilateral;   TRANSCATHETER AORTIC VALVE REPLACEMENT, TRANSFEMORAL N/A 03/13/2018   Procedure: TRANSCATHETER AORTIC VALVE REPLACEMENT, TRANSFEMORAL. Edwards Sapien 3 Transcatheter Heart Valve size 75mm.;  Surgeon: Burnell Blanks, MD;  Location: [place];  Service: Open Heart Surgery;  Laterality: N/A;    Current Medications: No outpatient medications have been marked as taking for the 06/03/22 encounter (Appointment) with Christell Faith  M, PA-C.     Allergies:   Patient has no known allergies.   Social History   Socioeconomic History   Marital status: Married    Spouse name: Not on file   Number of children: Not on file   Years of education: Not on file   Highest education level: Not on file  Occupational History   Not on file  Tobacco Use   Smoking  status: Never   Smokeless tobacco: Never  Vaping Use   Vaping Use: Never used  Substance and Sexual Activity   Alcohol use: No   Drug use: No   Sexual activity: Not on file  Other Topics Concern   Not on file  Social History Narrative   Not on file   Social Determinants of Health   Financial Resource Strain: Not on file  Food Insecurity: Not on file  Transportation Needs: Not on file  Physical Activity: Not on file  Stress: Not on file  Social Connections: Not on file     Family History: The patient's ***family history includes Heart attack in his father; Heart disease in his father.  ROS:   Please see the history of present illness.    *** All other systems reviewed and are negative.  EKGs/Labs/Other Studies Reviewed:    The following studies were reviewed today: ***  EKG:  EKG is *** ordered today.  The ekg ordered today demonstrates ***  Recent Labs: 04/11/2022: ALT 19; Hemoglobin 16.3; Platelets 178 05/18/2022: BUN 12; Creatinine, Ser 0.98; Potassium 3.3; Sodium 145  Recent Lipid Panel    Component Value Date/Time   CHOL 223 (H) 04/11/2022 1057   CHOL 193 03/31/2020 1019   CHOL 215 (H) 07/02/2013 0058   TRIG 274 (H) 04/11/2022 1057   TRIG 143 07/02/2013 0058   HDL 42 04/11/2022 1057   HDL 48 03/31/2020 1019   HDL 38 (L) 07/02/2013 0058   CHOLHDL 5.3 04/11/2022 1057   VLDL 55 (H) 04/11/2022 1057   VLDL 29 07/02/2013 0058   LDLCALC 126 (H) 04/11/2022 1057   LDLCALC 117 (H) 03/31/2020 1019   LDLCALC 148 (H) 07/02/2013 0058     Risk Assessment/Calculations:   {Does this patient have ATRIAL FIBRILLATION?:[phone]}  No BP recorded.  {Refresh Note OR Click here to enter BP  :1}***         Physical Exam:    VS:  There were no vitals taken for this visit.    Wt Readings from Last 3 Encounters:  05/03/22 194 lb 4 oz (88.1 kg)  04/11/22 199 lb 12.8 oz (90.6 kg)  02/02/22 195 lb 6 oz (88.6 kg)     GEN: *** Well nourished, well developed in no  acute distress HEENT: Normal NECK: No JVD; No carotid bruits LYMPHATICS: No lymphadenopathy CARDIAC: ***RRR, no murmurs, rubs, gallops RESPIRATORY:  Clear to auscultation without rales, wheezing or rhonchi  ABDOMEN: Soft, non-tender, non-distended MUSCULOSKELETAL:  No edema; No deformity  SKIN: Warm and dry NEUROLOGIC:  Alert and oriented x 3 PSYCHIATRIC:  Normal affect   ASSESSMENT:    No diagnosis found. PLAN:    In order of problems listed above:  ***      {Are you ordering a CV Procedure (e.g. stress test, cath, DCCV, TEE, etc)?   Press F2        :[id number]}    Medication Adjustments/Labs and Tests Ordered: Current medicines are reviewed at length with the patient today.  Concerns regarding medicines are outlined above.  No orders  of the defined types were placed in this encounter.  No orders of the defined types were placed in this encounter.   There are no Patient Instructions on file for this visit.   Signed, Flossie Dibble, NP  06/02/2022 11:36 AM    [facility]

## 2022-06-03 ENCOUNTER — Encounter: Payer: Self-pay | Admitting: Physician Assistant

## 2022-06-03 ENCOUNTER — Ambulatory Visit: Payer: Medicare Other | Attending: Physician Assistant | Admitting: Cardiology

## 2022-06-03 ENCOUNTER — Other Ambulatory Visit
Admission: RE | Admit: 2022-06-03 | Discharge: 2022-06-03 | Disposition: A | Discharge status: Home / Self Care | Payer: Medicare Other | Attending: Cardiology | Admitting: Cardiology

## 2022-06-03 VITALS — BP 170/100 | HR 91 | Ht 65.0 in | Wt 195.0 lb

## 2022-06-03 DIAGNOSIS — E785 Hyperlipidemia, unspecified: Secondary | ICD-10-CM

## 2022-06-03 DIAGNOSIS — I25118 Atherosclerotic heart disease of native coronary artery with other forms of angina pectoris: Secondary | ICD-10-CM

## 2022-06-03 DIAGNOSIS — Z951 Presence of aortocoronary bypass graft: Secondary | ICD-10-CM

## 2022-06-03 DIAGNOSIS — Z758 Other problems related to medical facilities and other health care: Secondary | ICD-10-CM

## 2022-06-03 DIAGNOSIS — I10 Essential (primary) hypertension: Secondary | ICD-10-CM

## 2022-06-03 DIAGNOSIS — Z952 Presence of prosthetic heart valve: Secondary | ICD-10-CM

## 2022-06-03 DIAGNOSIS — Z789 Other specified health status: Secondary | ICD-10-CM

## 2022-06-03 LAB — BASIC METABOLIC PANEL WITH GFR
Anion gap: 8 (ref 5–15)
BUN: 9 mg/dL (ref 8–23)
CO2: 30 mmol/L (ref 22–32)
Calcium: 8.5 mg/dL — ABNORMAL LOW (ref 8.9–10.3)
Chloride: 100 mmol/L (ref 98–111)
Creatinine, Ser: 0.97 mg/dL (ref 0.61–1.24)
GFR, Estimated: >60 mL/min
Glucose, Bld: 125 mg/dL — ABNORMAL HIGH (ref 70–99)
Potassium: 3.1 mmol/L — ABNORMAL LOW (ref 3.5–5.1)
Sodium: 138 mmol/L (ref 135–145)

## 2022-06-03 MED ORDER — ISOSORBIDE MONONITRATE ER 30 MG PO TB24: 30.0000 mg | ORAL_TABLET | Freq: Every day | ORAL | 3 refills | Status: DC

## 2022-06-03 NOTE — Patient Instructions (Signed)
Please monitor blood pressures and keep a log of your readings.   Make sure to check 2 hours after your medications.   AVOID these things for 30 minutes before checking your blood pressure: No Drinking caffeine. No Drinking alcohol. No Eating. No Smoking. No Exercising.  Five minutes before checking your blood pressure: Pee. Sit in a dining chair. Avoid sitting in a soft couch or armchair. Be quiet. Do not talk.   Medication Instructions:  Your physician has recommended you make the following change in your medication:   START Isosorbide mononitrate 30 mg once daily   *If you need a refill on your cardiac medications before your next appointment, please call your pharmacy*   Lab Work: BMET today over at the [facility] entrance. Stop at registration desk to check in.   If you have labs (blood work) drawn today and your tests are completely normal, you will receive your results only by: [place] (if you have MyChart) OR A paper copy in the mail If you have any lab test that is abnormal or we need to change your treatment, we will call you to review the results.   Testing/Procedures: None   Follow-Up: At [facility], you and your health needs are our priority.  As part of our continuing mission to provide you with exceptional heart care, we have created designated Provider Care Teams.  These Care Teams include your primary Cardiologist (physician) and Advanced Practice Providers (APPs -  Physician Assistants and Nurse Practitioners) who all work together to provide you with the care you need, when you need it.  We recommend signing up for the patient portal called "MyChart".  Sign up information is provided on this After Visit Summary.  MyChart is used to connect with patients for Virtual Visits (Telemedicine).  Patients are able to view lab/test results, encounter notes, upcoming appointments, etc.  Non-urgent messages can be sent to your provider as  well.   To learn more about what you can do with MyChart, go to [email or web address].    Your next appointment:   2 month(s)  The format for your next appointment:   In Person  Provider:   [name], MD or Christell Faith, PA-C       Important Information About Sugar

## 2022-06-07 ENCOUNTER — Other Ambulatory Visit: Payer: Self-pay

## 2022-06-07 ENCOUNTER — Emergency Department: Payer: Medicare Other

## 2022-06-07 ENCOUNTER — Emergency Department
Admission: EM | Admit: 2022-06-07 | Discharge: 2022-06-07 | Disposition: A | Discharge status: Home / Self Care | Payer: Medicare Other | Attending: Emergency Medicine | Admitting: Emergency Medicine

## 2022-06-07 DIAGNOSIS — Z1152 Encounter for screening for COVID-19: Secondary | ICD-10-CM | POA: Insufficient documentation

## 2022-06-07 DIAGNOSIS — Z79899 Other long term (current) drug therapy: Secondary | ICD-10-CM | POA: Insufficient documentation

## 2022-06-07 DIAGNOSIS — I10 Essential (primary) hypertension: Secondary | ICD-10-CM | POA: Insufficient documentation

## 2022-06-07 DIAGNOSIS — R519 Headache, unspecified: Secondary | ICD-10-CM | POA: Insufficient documentation

## 2022-06-07 DIAGNOSIS — R509 Fever, unspecified: Secondary | ICD-10-CM | POA: Diagnosis not present

## 2022-06-07 DIAGNOSIS — R42 Dizziness and giddiness: Secondary | ICD-10-CM | POA: Diagnosis not present

## 2022-06-07 DIAGNOSIS — I2510 Atherosclerotic heart disease of native coronary artery without angina pectoris: Secondary | ICD-10-CM | POA: Insufficient documentation

## 2022-06-07 LAB — TROPONIN I (HIGH SENSITIVITY): Troponin I (High Sensitivity): 38 ng/L — ABNORMAL HIGH (ref <18)

## 2022-06-07 LAB — COMPREHENSIVE METABOLIC PANEL
ALT: 18 U/L (ref 0–44)
AST: 20 U/L (ref 15–41)
Albumin: 4.5 g/dL (ref 3.5–5.0)
Alkaline Phosphatase: 88 U/L (ref 38–126)
Anion gap: 9 (ref 5–15)
BUN: 13 mg/dL (ref 8–23)
CO2: 28 mmol/L (ref 22–32)
Calcium: 8.8 mg/dL — ABNORMAL LOW (ref 8.9–10.3)
Chloride: 100 mmol/L (ref 98–111)
Creatinine, Ser: 0.97 mg/dL (ref 0.61–1.24)
GFR, Estimated: >60 mL/min (ref >60)
Glucose, Bld: 129 mg/dL — ABNORMAL HIGH (ref 70–99)
Potassium: 3.1 mmol/L — ABNORMAL LOW (ref 3.5–5.1)
Sodium: 137 mmol/L (ref 135–145)
Total Bilirubin: 1.2 mg/dL (ref 0.3–1.2)
Total Protein: 8 g/dL (ref 6.5–8.1)

## 2022-06-07 LAB — LIPASE, BLOOD: Lipase: 45 U/L (ref 11–51)

## 2022-06-07 LAB — URINALYSIS, ROUTINE W REFLEX MICROSCOPIC
Bacteria, UA: NONE SEEN
Bilirubin Urine: NEGATIVE
Glucose, UA: NEGATIVE mg/dL
Hgb urine dipstick: NEGATIVE
Ketones, ur: NEGATIVE mg/dL
Leukocytes,Ua: NEGATIVE
Nitrite: NEGATIVE
Protein, ur: 100 mg/dL — AB
Specific Gravity, Urine: 1.024 (ref 1.005–1.030)
pH: 5 (ref 5.0–8.0)

## 2022-06-07 LAB — CBC
HCT: 46.6 % (ref 39.0–52.0)
Hemoglobin: 15.7 g/dL (ref 13.0–17.0)
MCH: 29.5 pg (ref 26.0–34.0)
MCHC: 33.7 g/dL (ref 30.0–36.0)
MCV: 87.4 fL (ref 80.0–100.0)
Platelets: 192 10*3/uL (ref 150–400)
RBC: 5.33 MIL/uL (ref 4.22–5.81)
RDW: 12.8 % (ref 11.5–15.5)
WBC: 11.7 10*3/uL — ABNORMAL HIGH (ref 4.0–10.5)
nRBC: 0 % (ref 0.0–0.2)

## 2022-06-07 LAB — RESP PANEL BY RT-PCR (RSV, FLU A&B, COVID)  RVPGX2
Influenza A by PCR: NEGATIVE
Influenza B by PCR: NEGATIVE
Resp Syncytial Virus by PCR: NEGATIVE
SARS Coronavirus 2 by RT PCR: NEGATIVE

## 2022-06-07 MED ORDER — DIPHENHYDRAMINE HCL 25 MG PO CAPS: 25.0000 mg | ORAL_CAPSULE | Freq: Once | ORAL | Status: DC

## 2022-06-07 MED ORDER — SODIUM CHLORIDE 0.9 % IV BOLUS
1000.0000 mL | Freq: Once | INTRAVENOUS | Status: AC
  Administered 2022-06-07: 1000 mL via INTRAVENOUS

## 2022-06-07 MED ORDER — ACETAMINOPHEN 500 MG PO TABS
1000.0000 mg | ORAL_TABLET | Freq: Once | ORAL | Status: AC
  Administered 2022-06-07: 1000 mg via ORAL
  Filled 2022-06-07: qty 2

## 2022-06-07 MED ORDER — KETOROLAC TROMETHAMINE 15 MG/ML IJ SOLN
15.0000 mg | Freq: Once | INTRAMUSCULAR | Status: AC
  Administered 2022-06-07: 15 mg via INTRAVENOUS
  Filled 2022-06-07: qty 1

## 2022-06-07 MED ORDER — METOCLOPRAMIDE HCL 5 MG/ML IJ SOLN
10.0000 mg | Freq: Once | INTRAMUSCULAR | Status: AC
  Administered 2022-06-07: 10 mg via INTRAVENOUS
  Filled 2022-06-07: qty 2

## 2022-06-07 MED ORDER — IOHEXOL 350 MG/ML SOLN
75.0000 mL | Freq: Once | INTRAVENOUS | Status: AC | PRN
  Administered 2022-06-07: 75 mL via INTRAVENOUS

## 2022-06-07 MED ORDER — DIPHENHYDRAMINE HCL 50 MG/ML IJ SOLN
25.0000 mg | Freq: Once | INTRAMUSCULAR | Status: AC
  Administered 2022-06-07: 25 mg via INTRAVENOUS
  Filled 2022-06-07: qty 1

## 2022-06-07 NOTE — ED Notes (Signed)
Went to check on pt. Pt in not in room. Pt in MRI

## 2022-06-07 NOTE — ED Notes (Signed)
Pt reports feeling a little better post medication. Lights dimmed for comfort.

## 2022-06-07 NOTE — ED Notes (Signed)
Pt reports headache slightly improved, rates it 4/10 - c/o room spinning sensation. Dr Jori Moll aware - see new orders.

## 2022-06-07 NOTE — ED Provider Triage Note (Signed)
Emergency Medicine Provider Triage Evaluation Note  Gohan Collister , a 67 y.o. male  was evaluated in triage.  Pt complains of headache, fever and chills for several days. Also c/o abdominal pain.    Review of Systems  Positive: See above Negative:   Physical Exam  BP (!) 157/100 (BP Location: Right Arm)   Pulse 73   Temp 99 F (37.2 C) (Oral)   Resp 18   SpO2 95%  Gen:   Awake, no distress   Resp:  Normal effort  MSK:   Moves extremities without difficulty  Other:    Medical Decision Making  Medically screening exam initiated at 2:06 PM.  Appropriate orders placed.  Jeryn Cerney was informed that the remainder of the evaluation will be completed by another provider, this initial triage assessment does not replace that evaluation, and the importance of remaining in the ED until their evaluation is complete.     Johnn Hai, PA-C 06/07/22 1407

## 2022-06-07 NOTE — ED Triage Notes (Signed)
Pt presents to ED with c/o of HA, fever and chills Pt also endorses ABD pain. Pt denies blood thinner use.

## 2022-06-07 NOTE — ED Provider Notes (Signed)
[facility] Provider Note    Event Date/Time   First MD Initiated Contact with Patient 06/07/22 1519     (approximate)   History   Fever and Headache   HPI  James Yates is a 67 y.o. male past medical history significant for CAD, hypertension, who presents to the emergency department with a headache and not feeling well.  Patient states that he has been having a headache and not feeling well for the past couple of days.  States that she has had subjective fever and chills at home.  Denies any significant cough but does endorse some nausea and diarrhea.  Denies any dysuria, urinary urgency or frequency.  States that it feels like he gets dizzy and off balance when he goes to stand up.  Denies any ringing in ears or change of hearing.     Physical Exam   Triage Vital Signs: ED Triage Vitals  Enc Vitals Group     BP 06/07/22 1402 (!) 157/100     Pulse Rate 06/07/22 1402 73     Resp 06/07/22 1402 18     Temp 06/07/22 1402 99 F (37.2 C)     Temp Source 06/07/22 1402 Oral     SpO2 06/07/22 1402 95 %     Weight --      Height --      Head Circumference --      Peak Flow --      Pain Score 06/07/22 1404 8     Pain Loc --      Pain Edu? --      Excl. in GC? --     Most recent vital signs: Vitals:   06/07/22 1748 06/07/22 1852  BP:  (!) 147/76  Pulse:  68  Resp:  20  Temp: 98.7 F (37.1 C)   SpO2:  98%    Physical Exam Constitutional:      Appearance: He is well-developed.  HENT:     Head: Atraumatic.  Eyes:     Conjunctiva/sclera: Conjunctivae normal.  Cardiovascular:     Rate and Rhythm: Regular rhythm.  Pulmonary:     Effort: No respiratory distress.  Abdominal:     General: There is no distension.     Tenderness: There is no abdominal tenderness.  Musculoskeletal:     Cervical back: Normal range of motion.  Skin:    General: Skin is warm.  Neurological:     Mental Status: He is alert. Mental status is at baseline.      GCS: GCS eye subscore is 4. GCS verbal subscore is 5. GCS motor subscore is 6.     Comments: Cranial nerves grossly intact.  5/5 strength bilateral upper and lower extremities.  Sensation intact in upper and lower extremities.  Holding onto the wall with gait     IMPRESSION / MDM / ASSESSMENT AND PLAN / ED COURSE  I reviewed the triage vital signs and the nursing notes.  Differential diagnosis including viral illness of COVID/influenza, peripheral vertigo, central vertigo, CVA, intracranial hemorrhage/infarction, electrolyte abnormality.  No fever while in the emergency department and no meningismus.  No altered mental status have a low suspicion for meningitis.  Patient was given IV medications for migraine headache    RADIOLOGY I independently reviewed imaging, my interpretation of imaging: CT angiography of head and neck with no acute findings.  On reevaluation patient states that his headache has improved but continues to have a feeling of feeling swimmy headed.  Will obtain an MRI to evaluate for central cause of his headache/dizziness  MRI independently reviewed with no signs of CVA.  Read as no acute findings.  LABS (all labs ordered are listed, but only abnormal results are displayed) Labs interpreted as -  Mild hypokalemia.  No other significant electrolyte abnormalities.  COVID and influenza testing are negative.  No findings of urinary tract infection.  Labs Reviewed  COMPREHENSIVE METABOLIC PANEL - Abnormal; Notable for the following components:      Result Value   Potassium 3.1 (*)    Glucose, Bld 129 (*)    Calcium 8.8 (*)    All other components within normal limits  CBC - Abnormal; Notable for the following components:   WBC 11.7 (*)    All other components within normal limits  URINALYSIS, ROUTINE W REFLEX MICROSCOPIC - Abnormal; Notable for the following components:   Color, Urine YELLOW (*)    APPearance HAZY (*)    Protein, ur 100 (*)    All other  components within normal limits  TROPONIN I (HIGH SENSITIVITY) - Abnormal; Notable for the following components:   Troponin I (High Sensitivity) 38 (*)    All other components within normal limits  RESP PANEL BY RT-PCR (RSV, FLU A&B, COVID)  RVPGX2  LIPASE, BLOOD  TROPONIN I (HIGH SENSITIVITY)    TREATMENT   IV fluids, migraine cocktail  On reevaluation patient said he had significant improvement and is no longer with a headache.  Low suspicion for cerebral venous thrombosis.  On reevaluation patient able to ambulate and states that he is feeling much better.  No longer with a headache.  Ambulating in the emergency department.  Patient states that he will follow-up with his primary care physician.  Given return precautions for any worsening symptoms.   PROCEDURES:  Critical Care performed: No  Procedures  Patient's presentation is most consistent with acute presentation with potential threat to life or bodily function.   MEDICATIONS ORDERED IN ED: Medications  sodium chloride 0.9 % bolus 1,000 mL (0 mLs Intravenous Stopped 06/07/22 2035)  metoCLOPramide (REGLAN) injection 10 mg (10 mg Intravenous Given 06/07/22 1707)  diphenhydrAMINE (BENADRYL) injection 25 mg (25 mg Intravenous Given 06/07/22 1707)  iohexol (OMNIPAQUE) 350 MG/ML injection 75 mL (75 mLs Intravenous Contrast Given 06/07/22 1716)  ketorolac (TORADOL) 15 MG/ML injection 15 mg (15 mg Intravenous Given 06/07/22 1822)  acetaminophen (TYLENOL) tablet 1,000 mg (1,000 mg Oral Given 06/07/22 1822)    FINAL CLINICAL IMPRESSION(S) / ED DIAGNOSES   Final diagnoses:  Nonintractable headache, unspecified chronicity pattern, unspecified headache type  Dizzy     Rx / DC Orders   ED Discharge Orders     None        Note:  This document was prepared using Dragon voice recognition software and may include unintentional dictation errors.   Nathaniel Man, MD 06/07/22 2157

## 2022-06-10 ENCOUNTER — Telehealth: Payer: Self-pay

## 2022-06-10 NOTE — Telephone Encounter (Signed)
Transition Care Management Follow-up Telephone Call  Spanish Interpreter: Nikki Dom Date of discharge and from where: 06/07/22 [facility] How have you been since you were released from the hospital? Doing okay. Denies headache or any ongoing symptoms. Any questions or concerns? No  Items Reviewed: Did the pt receive and understand the discharge instructions provided? Yes  Medications obtained and verified? Yes  Other? No  Any new allergies since your discharge? No  Dietary orders reviewed? Yes Do you have support at home? Yes   Home Care and Equipment/Supplies: Were home health services ordered? no If so, what is the name of the agency? N/a Has the agency set up a time to come to the patient's home? not applicable Were any new equipment or medical supplies ordered?  No What is the name of the medical supply agency? N/a Were you able to get the supplies/equipment? not applicable Do you have any questions related to the use of the equipment or supplies? No  Functional Questionnaire: (I = Independent and D = Dependent) ADLs: I  Bathing/Dressing- I  Meal Prep- I  Eating- I  Maintaining continence- I  Transferring/Ambulation- I  Managing Meds- I  Follow up appointments reviewed:  PCP Hospital f/u appt confirmed? Yes  Scheduled to see PCP on 06/17/22 @ 11:00. Use in-house AMN interpreting system. [facility] f/u appt confirmed? N/a. Are transportation arrangements needed? No  If their condition worsens, is the pt aware to call PCP or go to the Emergency Dept.? Yes Was the patient provided with contact information for the PCP's office or ED? Yes Was the pt encouraged to call back with questions or concerns? Yes

## 2022-06-17 ENCOUNTER — Ambulatory Visit (INDEPENDENT_AMBULATORY_CARE_PROVIDER_SITE_OTHER): Payer: Medicare Other

## 2022-06-17 ENCOUNTER — Encounter: Payer: Self-pay | Admitting: Family Medicine

## 2022-06-17 ENCOUNTER — Ambulatory Visit (INDEPENDENT_AMBULATORY_CARE_PROVIDER_SITE_OTHER): Payer: Medicare Other | Admitting: Family Medicine

## 2022-06-17 VITALS — BP 130/70 | HR 71 | Temp 98.6°F | Ht 65.0 in | Wt 196.6 lb

## 2022-06-17 DIAGNOSIS — M25512 Pain in left shoulder: Secondary | ICD-10-CM

## 2022-06-17 DIAGNOSIS — I25719 Atherosclerosis of autologous vein coronary artery bypass graft(s) with unspecified angina pectoris: Secondary | ICD-10-CM | POA: Diagnosis not present

## 2022-06-17 DIAGNOSIS — M5412 Radiculopathy, cervical region: Secondary | ICD-10-CM | POA: Diagnosis not present

## 2022-06-17 DIAGNOSIS — G8929 Other chronic pain: Secondary | ICD-10-CM

## 2022-06-17 DIAGNOSIS — F419 Anxiety disorder, unspecified: Secondary | ICD-10-CM | POA: Diagnosis not present

## 2022-06-17 DIAGNOSIS — R519 Headache, unspecified: Secondary | ICD-10-CM

## 2022-06-17 DIAGNOSIS — F32A Depression, unspecified: Secondary | ICD-10-CM

## 2022-06-17 DIAGNOSIS — R42 Dizziness and giddiness: Secondary | ICD-10-CM

## 2022-06-17 DIAGNOSIS — I10 Essential (primary) hypertension: Secondary | ICD-10-CM

## 2022-06-17 LAB — BASIC METABOLIC PANEL
BUN: 12 mg/dL (ref 6–23)
CO2: 31 mEq/L (ref 19–32)
Calcium: 8.8 mg/dL (ref 8.4–10.5)
Chloride: 102 mEq/L (ref 96–112)
Creatinine, Ser: 1 mg/dL (ref 0.40–1.50)
GFR: 78.68 mL/min (ref >60.00)
Glucose, Bld: 102 mg/dL — ABNORMAL HIGH (ref 70–99)
Potassium: 3.6 mEq/L (ref 3.5–5.1)
Sodium: 143 mEq/L (ref 135–145)

## 2022-06-17 LAB — CBC
HCT: 48.2 % (ref 39.0–52.0)
Hemoglobin: 16.3 g/dL (ref 13.0–17.0)
MCHC: 33.9 g/dL (ref 30.0–36.0)
MCV: 88.1 fl (ref 78.0–100.0)
Platelets: 172 10*3/uL (ref 150.0–400.0)
RBC: 5.47 Mil/uL (ref 4.22–5.81)
RDW: 13.3 % (ref 11.5–15.5)
WBC: 6.8 10*3/uL (ref 4.0–10.5)

## 2022-06-17 LAB — SEDIMENTATION RATE: Sed Rate: 16 mm/hr (ref 0–20)

## 2022-06-17 MED ORDER — SERTRALINE HCL 50 MG PO TABS: 50.0000 mg | ORAL_TABLET | Freq: Every day | ORAL | 3 refills | Status: DC

## 2022-06-17 NOTE — Assessment & Plan Note (Signed)
Blood pressure is significantly better today than it was 2 weeks ago at cardiology.  He will continue Imdur 30 mg daily, losartan 50 mg daily, and metoprolol 100 mg twice daily.

## 2022-06-17 NOTE — Patient Instructions (Signed)
Nice to see you. We will get an x-ray of your neck today. Orthopedic should call you to schedule an evaluation for your shoulder pain. We can get lab work today as well. I would like for you to do the following exercise at home to see if this will help with your dizziness.  If it is not helping please let me know and we can have you see an ear nose and throat doctor. We are going to start you on Zoloft to help with your anxiety and depression.  If you notice weight gain or sexual dysfunction with this please let us know.  Cmo realizar la Lexicographer de Epley How to Perform the [employer] de Epley es un ejercicio que [employer] sntomas del vrtigo. El vrtigo es la sensacin de que usted o todo lo que lo rodea se mueve cuando en realidad eso no sucede. Cuando una persona siente vrtigo, puede tener la sensacin de que la habitacin da vueltas y puede tener dificultad para caminar. La maniobra de Epley se [place] para un tipo de vrtigo que es causado por un depsito de calcio en una parte del odo interno. La maniobra implica poner la cabeza en distintas posiciones para ayudar a que el depsito salga de la zona. Puede realizar [employer] en su casa cuando tenga los sntomas de vrtigo. Puede repetirla tras 24 horas si el vrtigo no ha desaparecido. Aunque la Yahoo de Engineer, petroleum lo [place] del vrtigo durante algunas semanas, es posible que los sntomas vuelvan. Esta [employer] sntomas del vrtigo, pero no los [place]. Cules son los riesgos? Si se realiza de forma correcta, la [place] de [place]pley se considera un procedimiento seguro. En ocasiones, puede provocar mareos o nuseas que desaparecen despus de un breve perodo. Si tiene otros sntomas, como cambios en la visin, debilidad o entumecimiento, deje de Statistician y llame al mdico. Materiales necesarios: Una cama o una mesa. Fermin Schwab. Cmo hacer la Yahoo de Epley     Sintese en el borde de una cama o  una mesa con la espalda recta y las piernas extendidas o colgando sobre el borde de la cama o la mesa. Gire la cabeza a Educational psychologist odo o el lado [place], como se lo haya indicado el mdico. Recustese hacia atrs rpidamente con la cabeza girada hasta que se encuentre recostado sobre la espalda. La cabeza debe quedar colgando (posicin de cabeza colgante). Es Medical laboratory scientific officer una almohada debajo de los hombros. Mantenga esta posicin durante 30 segundos como mnimo. Si se siente mareado o tiene sntomas de vrtigo, contine sosteniendo la posicin hasta que los sntomas desaparezcan. Gire la cabeza en direccin opuesta hasta que el odo no afectado est orientado hacia abajo. La cabeza debe seguir colgando. Mantenga esta posicin durante 30 segundos como mnimo. Si se siente mareado o tiene sntomas de vrtigo, contine sosteniendo la posicin hasta que los sntomas desaparezcan. Gire todo el cuerpo [employer] mismo lado que la cabeza de modo que quede recostado de lado. La cabeza quedar casi boca abajo y ya no ser necesario que cuelgue. Mantenga esta posicin durante 30 segundos como mnimo. Si se siente mareado o tiene sntomas de vrtigo, contine sosteniendo la posicin hasta que los sntomas desaparezcan. Vuelva a sentarse. Puede repetir la maniobra tras 24 horas si el vrtigo no desaparece. Siga estas instrucciones en su casa: Durante 24 horas despus de Statistician de Epley: Mantenga la cabeza en posicin erguida. Cuando se acueste para dormir  o descansar, mantenga la cabeza levantada (elevada) con dos o ms almohadas. Evite hacer movimientos excesivos con el cuello. Actividad No conduzca vehculos ni opere maquinaria si se siente mareado. Despus de [employer] de Epley, reanude sus actividades normales como se lo haya indicado el mdico. Pregntele al mdico qu actividades son seguras para usted. Instrucciones generales Beba suficiente lquido como para mantener  la orina de color amarillo plido. No beba alcohol. Use los medicamentos de venta libre y los recetados solamente como se lo haya indicado el mdico. Concurra a todas las visitas de seguimiento. Esto es importante. Prevencin de los sntomas del vrtigo Pregntele al mdico si debe hacer algo en su casa para evitar el vrtigo. El mdico puede recomendarle lo siguiente: Theatre manager la cabeza elevada con dos o ms [employer]	[place] duerme. No dormir sobre el lado del odo afectado. Levantarse lentamente de la cama. Evitar los movimientos repentinos Agricultural consultant. [place] y los movimientos de [place] extremos, como mirar hacia arriba o doblarse. Comunquese con un mdico si: El vrtigo empeora. Tiene otros sntomas, como los siguientes: Nuseas. Vmitos. Dolor de [place]. Busque ayuda de inmediato si: Tiene cambios en la visin. Tiene dolor de [place] o de cuello que es intenso o que [place]. No puede parar de vomitar. Siente un nuevo adormecimiento o debilidad en alguna parte del cuerpo. Estos sntomas pueden representar un problema grave que constituye Engineer, maintenance (IT). No espere a ver si los sntomas desaparecen. Solicite atencin mdica de inmediato. Comunquese con el servicio de emergencias de su localidad (911 en los [place]). No conduzca por sus propios medios [employer]. Resumen El vrtigo es la sensacin de que usted o todo lo que lo rodea se mueve cuando en realidad eso no sucede. La Yahoo de Epley es un ejercicio que [place] los sntomas del vrtigo. Si se realiza de forma correcta, la [place] de Epley se considera un procedimiento seguro. Esta informacin no tiene Marine scientist el consejo del mdico. Asegrese de hacerle al mdico cualquier pregunta que tenga. Document Revised: 05/11/2020 Document Reviewed: 05/11/2020 Elsevier Patient Education  Home.

## 2022-06-17 NOTE — Progress Notes (Signed)
Marikay Alar, MD Phone: [phone]  James Yates is a 67 y.o. male who presents today for f/u.  Virtual Spanish interpreter used for this patient.  CAD/hypertension: Patient is on Imdur, losartan, and metoprolol.  He does note exertional chest pain and shortness of breath that resolves with rest.  He notes this occurs often though has been going on chronically.  He saw cardiology couple of weeks ago and they are aware of this.  They did start him on Imdur.  Headache: Patient reports he went to the emergency room and was evaluated for headache.  He describes it as a stabbing headache that occurs in a band from his temple around the back of his head and to his other temple.  He notes it was 24 hours straight of that.  He felt weak all over with this.  He had no nausea or diarrhea.  CTA head and neck and MRI brain did not reveal a cause for this.  He notes it is much improved though occasionally still occurs.  He notes no vision changes with this.  Left shoulder pain: This has been going on for some time.  He notes if he moves the shoulder it can trigger the pain.  There are not any specific movements that trigger the pain.  He notes it is significant enough that he has trouble moving the arm when it occurs.  He does note occasionally getting some numbness in his trapezius area.  Dizziness: Patient notes this occurs occasionally.  It occurs when he has been out for a while on his feet and however the spinning sensation.  Does not occur when he is in bed.  Insomnia/anxiety/depression: Patient notes his sleep is not good.  He wakes up between 4 and 6 AM and has trouble falling asleep and wakes up throughout the night.  He does not drink any alcohol.  He does not have any caffeine late in the day.  He does watch some TV or reads before bed.  He has anxiety and depression.  At times he feels as though his life is worthless though he has no plans to kill himself.   Social History   Tobacco Use   Smoking Status Never  Smokeless Tobacco Never    Current Outpatient Medications on File Prior to Visit  Medication Sig Dispense Refill   Acetaminophen (TYLENOL PO) Take by mouth as needed.     amoxicillin (AMOXIL) 500 MG capsule TAKE 4 CAPSULES BY MOUTH ONCE FOR 1 DOSE. TO BE TAKEN 30 60 MIN PRIOR TO DENTAL PROCEDURE. 4 capsule 1   aspirin EC 81 MG tablet Take 1 tablet (81 mg total) by mouth daily. 30 tablet 3   isosorbide mononitrate (IMDUR) 30 MG 24 hr tablet Take 1 tablet (30 mg total) by mouth daily. 90 tablet 3   losartan (COZAAR) 50 MG tablet Take 1 tablet (50 mg total) by mouth daily. 90 tablet 3   metoprolol tartrate (LOPRESSOR) 100 MG tablet Take 1 tablet (100 mg total) by mouth 2 (two) times daily. 180 tablet 1   pantoprazole (PROTONIX) 40 MG tablet Take 1 tablet (40 mg total) by mouth daily. 90 tablet 3   rosuvastatin (CRESTOR) 20 MG tablet Take 1 tablet (20 mg total) by mouth daily. 90 tablet 3   No current facility-administered medications on file prior to visit.     ROS see history of present illness  Objective  Physical Exam Vitals:   06/17/22 1112  BP: 130/70  Pulse: 71  Temp:  98.6 F (37 C)  SpO2: 97%   Laying blood pressure 144/88 pulse 69 Sitting blood pressure 150/95 pulse 71 Standing blood pressure 158/93 pulse 71  BP Readings from Last 3 Encounters:  06/17/22 130/70  06/07/22 (!) 138/94  06/03/22 (!) 170/100   Wt Readings from Last 3 Encounters:  06/17/22 196 lb 9.6 oz (89.2 kg)  06/03/22 195 lb (88.5 kg)  05/03/22 194 lb 4 oz (88.1 kg)    Physical Exam Constitutional:      General: He is not in acute distress.    Appearance: He is not diaphoretic.  HENT:     Right Ear: Tympanic membrane normal.     Left Ear: Tympanic membrane normal.  Cardiovascular:     Rate and Rhythm: Normal rate and regular rhythm.     Heart sounds: Normal heart sounds.  Pulmonary:     Effort: Pulmonary effort is normal.     Breath sounds: Normal breath sounds.   Musculoskeletal:     Comments: Left shoulder has tenderness over anterior and posterior shoulder, he has full active and passive range of motion of the left shoulder and has discomfort on active and passive internal rotation and external rotation, positive empty can with some weakness on empty can testing on the left, no right shoulder tenderness, full active range of motion right shoulder with no discomfort, negative empty can on the right, negative speeds bilaterally  Skin:    General: Skin is warm and dry.  Neurological:     Mental Status: He is alert.     Comments: 5/5 strength bilateral biceps, triceps, handgrip, sensation light touch intact bilateral upper extremities      Assessment/Plan: Please see individual problem list.  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  He was recently started on Imdur to see if that would help with his chronic chest pain and shortness of breath.  He will continue to follow with cardiology on that and continue Imdur 30 mg daily.   HTN, goal below 140/90 Assessment & Plan: Blood pressure is significantly better today than it was 2 weeks ago at cardiology.  He will continue Imdur 30 mg daily, losartan 50 mg daily, and metoprolol 100 mg twice daily.  Orders: -     Basic metabolic panel -     CBC  Anxiety and depression Assessment & Plan: Chronic issue.  Patient does have significant issues with this.  I suspect this is contributing to his sleep issues.  We are going to have him stop amitriptyline and start on Zoloft 50 mg daily.  I will see him back in 6 weeks.  Discussed risk of weight gain and sexual dysfunction with this.  Orders: -     Sertraline HCl; Take 1 tablet (50 mg total) by mouth daily.  Dispense: 30 tablet; Refill: 3  Chronic left shoulder pain Assessment & Plan: Chronic issue.  I suspect he has rotator cuff  impingement or tear in his left shoulder.  Will refer to orthopedics.  Orders: -     Ambulatory referral to Orthopedic Surgery  Chronic nonintractable headache, unspecified headache type Assessment & Plan: Ongoing issue.  He had negative imaging recently.  Will check an ESR for completeness sake.  Discussed that his poor sleep may be contributing to his headaches.  Orders: -     Sedimentation rate  Cervical radiculopathy Assessment & Plan: Patient does report some possible nerve impingement  symptoms with some numbness into his trapezius muscle.  Will get an x-ray of his neck today.  Orders: -     DG Cervical Spine Complete; Future  Dizziness Assessment & Plan: Orthostatics were negative.  This could be related to an inner ear issue.  I gave him modified Epley maneuver to do at home and if this is not beneficial over the next week he will let us know we can refer to ENT for further evaluation.  Notably he did have a negative MRI brain recently.      Return in about 6 weeks (around 07/29/2022).  CMA will contact patient to set up follow-up for 6 weeks as this was not noted on his check out paperwork.   I have spent 47 minutes in the care of this patient regarding history taking, documentation, completion of exam, review of recent ED visit, discussion of plan, placing orders.   Tommi Rumps, MD [place]

## 2022-06-17 NOTE — Assessment & Plan Note (Signed)
Ongoing issue.  He had negative imaging recently.  Will check an ESR for completeness sake.  Discussed that his poor sleep may be contributing to his headaches.

## 2022-06-17 NOTE — Addendum Note (Signed)
Addended by: Caryl Bis, Bryelle Spiewak G on: 06/17/2022 01:04 PM   Modules accepted: Orders

## 2022-06-17 NOTE — Assessment & Plan Note (Signed)
Chronic issue.  I suspect he has rotator cuff impingement or tear in his left shoulder.  Will refer to orthopedics.

## 2022-06-17 NOTE — Assessment & Plan Note (Signed)
Chronic issue.  Patient does have significant issues with this.  I suspect this is contributing to his sleep issues.  We are going to have him stop amitriptyline and start on Zoloft 50 mg daily.  I will see him back in 6 weeks.  Discussed risk of weight gain and sexual dysfunction with this.

## 2022-06-17 NOTE — Assessment & Plan Note (Signed)
Patient does report some possible nerve impingement symptoms with some numbness into his trapezius muscle.  Will get an x-ray of his neck today.

## 2022-06-17 NOTE — Assessment & Plan Note (Signed)
Patient has been following with cardiology.  He was recently started on Imdur to see if that would help with his chronic chest pain and shortness of breath.  He will continue to follow with cardiology on that and continue Imdur 30 mg daily.

## 2022-06-17 NOTE — Assessment & Plan Note (Signed)
Orthostatics were negative.  This could be related to an inner ear issue.  I gave him modified Epley maneuver to do at home and if this is not beneficial over the next week he will let us know we can refer to ENT for further evaluation.  Notably he did have a negative MRI brain recently.

## 2022-06-24 ENCOUNTER — Other Ambulatory Visit: Payer: Self-pay | Admitting: Family Medicine

## 2022-06-24 DIAGNOSIS — M5412 Radiculopathy, cervical region: Secondary | ICD-10-CM

## 2022-08-03 ENCOUNTER — Encounter: Payer: Self-pay | Admitting: Family Medicine

## 2022-08-03 ENCOUNTER — Ambulatory Visit (INDEPENDENT_AMBULATORY_CARE_PROVIDER_SITE_OTHER): Payer: Medicare Other | Admitting: Family Medicine

## 2022-08-03 VITALS — BP 126/84 | HR 86 | Temp 98.7°F | Ht 65.0 in | Wt 191.4 lb

## 2022-08-03 DIAGNOSIS — R200 Anesthesia of skin: Secondary | ICD-10-CM | POA: Insufficient documentation

## 2022-08-03 DIAGNOSIS — F419 Anxiety disorder, unspecified: Secondary | ICD-10-CM | POA: Diagnosis not present

## 2022-08-03 DIAGNOSIS — M5442 Lumbago with sciatica, left side: Secondary | ICD-10-CM

## 2022-08-03 DIAGNOSIS — F32A Depression, unspecified: Secondary | ICD-10-CM | POA: Diagnosis not present

## 2022-08-03 DIAGNOSIS — M1990 Unspecified osteoarthritis, unspecified site: Secondary | ICD-10-CM | POA: Insufficient documentation

## 2022-08-03 DIAGNOSIS — M5441 Lumbago with sciatica, right side: Secondary | ICD-10-CM

## 2022-08-03 DIAGNOSIS — G8929 Other chronic pain: Secondary | ICD-10-CM | POA: Insufficient documentation

## 2022-08-03 DIAGNOSIS — M79641 Pain in right hand: Secondary | ICD-10-CM

## 2022-08-03 DIAGNOSIS — B351 Tinea unguium: Secondary | ICD-10-CM | POA: Diagnosis not present

## 2022-08-03 LAB — HEPATIC FUNCTION PANEL
ALT: 14 U/L (ref 0–53)
AST: 17 U/L (ref 0–37)
Albumin: 4.2 g/dL (ref 3.5–5.2)
Alkaline Phosphatase: 85 U/L (ref 39–117)
Bilirubin, Direct: 0.1 mg/dL (ref 0.0–0.3)
Total Bilirubin: 0.7 mg/dL (ref 0.2–1.2)
Total Protein: 7.4 g/dL (ref 6.0–8.3)

## 2022-08-03 MED ORDER — WRIST SPLINT/COCK-UP/RIGHT L MISC: 0 refills | Status: DC

## 2022-08-03 MED ORDER — WRIST SPLINT/COCK-UP/LEFT L MISC: 0 refills | Status: DC

## 2022-08-03 MED ORDER — TERBINAFINE HCL 250 MG PO TABS: 250.0000 mg | ORAL_TABLET | Freq: Every day | ORAL | 0 refills | Status: DC

## 2022-08-03 MED ORDER — SERTRALINE HCL 100 MG PO TABS: 100.0000 mg | ORAL_TABLET | Freq: Every day | ORAL | 3 refills | Status: DC

## 2022-08-03 MED ORDER — PREDNISONE 20 MG PO TABS: 40.0000 mg | ORAL_TABLET | Freq: Every day | ORAL | 0 refills | Status: DC

## 2022-08-03 NOTE — Assessment & Plan Note (Signed)
Chronic issue.  Improved some.  We will increase his Zoloft to 100 milligrams daily to see if he gets additional benefit.  He will monitor for side effects.

## 2022-08-03 NOTE — Patient Instructions (Signed)
Nice to see you. You can get your cock-up splint at [facility] medical supply.  This is the address [address], [place], [place] [address]. Please use the cock up wrist splints nightly. If you get excessively agitated or you have trouble sleeping on the prednisone please let us know. If you develop any abdominal pain or notice any other new symptoms with being on the Lamisil please contact us right away. I have sent in a higher dose of Zoloft for you to start on for the anxiety and depression. Physical therapy and a back specialist should contact you to set up evaluations and treatment for your back.

## 2022-08-03 NOTE — Assessment & Plan Note (Signed)
Likely arthritis in his left middle finger.  We will see how this responds to the prednisone we are using for his back.  If it does not resolve with this we can consider Tylenol moving forward.

## 2022-08-03 NOTE — Progress Notes (Signed)
Tommi Rumps, MD Phone: [phone]  James Yates is a 67 y.o. male who presents today for f/u.  Video interpreter was used.  Anxiety/depression: Patient notes some improvement in the symptoms.  He is taking the Zoloft daily.  He notes no SI.  Bilateral hand numbness: Patient notes this has been going on for quite some time.  His whole hand will get numb and he has to change positions.  It occurs intermittently.  He has no other numbness in his body.  He notes no weakness in his body.  He notes that this is occurring a little more frequently recently.  Patient reports he has 1 cock-up splint at home already.  Low back pain: This is a chronic issue going back a number of years.  He notes at some point in the past he was in the emergency department for this and got a shot in his back which was beneficial.  He notes radiation down the posterior aspect of both legs right greater than left.  No numbness or weakness.  No incontinence.  Left middle finger pain: Patient notes at times this hurts for 4 days at a time.  Pain comes out of nowhere.  He has difficulty bending it at times.  Right index finger thickened nail: Patient notes intermittently having issues with this.  He wonders what to do about this.  Does report some similar nail findings on his toenails.  Social History   Tobacco Use  Smoking Status Never  Smokeless Tobacco Never    Current Outpatient Medications on File Prior to Visit  Medication Sig Dispense Refill   Acetaminophen (TYLENOL PO) Take by mouth as needed.     aspirin EC 81 MG tablet Take 1 tablet (81 mg total) by mouth daily. 30 tablet 3   isosorbide mononitrate (IMDUR) 30 MG 24 hr tablet Take 1 tablet (30 mg total) by mouth daily. 90 tablet 3   metoprolol tartrate (LOPRESSOR) 100 MG tablet Take 1 tablet (100 mg total) by mouth 2 (two) times daily. 180 tablet 1   pantoprazole (PROTONIX) 40 MG tablet Take 1 tablet (40 mg total) by mouth daily. 90 tablet 3    amoxicillin (AMOXIL) 500 MG capsule TAKE 4 CAPSULES BY MOUTH ONCE FOR 1 DOSE. TO BE TAKEN 30 60 MIN PRIOR TO DENTAL PROCEDURE. (Patient not taking: Reported on 08/03/2022) 4 capsule 1   losartan (COZAAR) 50 MG tablet Take 1 tablet (50 mg total) by mouth daily. 90 tablet 3   rosuvastatin (CRESTOR) 20 MG tablet Take 1 tablet (20 mg total) by mouth daily. 90 tablet 3   No current facility-administered medications on file prior to visit.     ROS see history of present illness  Objective  Physical Exam Vitals:   08/03/22 1113  BP: 126/84  Pulse: 86  Temp: 98.7 F (37.1 C)  SpO2: 99%    BP Readings from Last 3 Encounters:  08/03/22 126/84  06/17/22 130/70  06/07/22 (!) 138/94   Wt Readings from Last 3 Encounters:  08/03/22 191 lb 6.4 oz (86.8 kg)  06/17/22 196 lb 9.6 oz (89.2 kg)  06/03/22 195 lb (88.5 kg)    Physical Exam Constitutional:      General: He is not in acute distress.    Appearance: He is not diaphoretic.  Cardiovascular:     Rate and Rhythm: Normal rate and regular rhythm.     Heart sounds: Normal heart sounds.  Pulmonary:     Effort: Pulmonary effort is normal.  Breath sounds: Normal breath sounds.  Musculoskeletal:     Comments: No midline spine tenderness, no midline spine step-off, there is lumbar muscular back tenderness with no overlying skin changes, there is tenderness of the left middle finger DIP and PIP joints, no swelling of the left middle finger, no erythema of the joints in the left middle finger, negative Phalen's bilaterally, positive Tinel's bilateral wrists, positive slump test bilaterally  Skin:    General: Skin is warm and dry.     Comments: Right index finger nail thickened and discolored  Neurological:     Mental Status: He is alert.     Comments: 5/5 strength in bilateral biceps, triceps, grip, quads, hamstrings, plantar and dorsiflexion, sensation to light touch intact in bilateral UE and LE      Assessment/Plan: Please see  individual problem list.  Anxiety and depression Assessment & Plan: Chronic issue.  Improved some.  We will increase his Zoloft to 100 milligrams daily to see if he gets additional benefit.  He will monitor for side effects.  Orders: -     Sertraline HCl; Take 1 tablet (100 mg total) by mouth daily.  Dispense: 30 tablet; Refill: 3  Chronic bilateral low back pain with bilateral sciatica Assessment & Plan: Patient has chronic low back pain with sciatica.  Exam indicates nerve impingement.  Will treat with prednisone 40 mg daily for 5 days.  Discussed risk of sleep issues and agitation.  If he gets excessively agitated or if he has trouble sleeping he will let us know.  Will refer for physical therapy and for evaluation for possible repeat back injection.  Orders: -     predniSONE; Take 2 tablets (40 mg total) by mouth daily with breakfast.  Dispense: 10 tablet; Refill: 0 -     Ambulatory referral to Physical Therapy -     Ambulatory referral to Physical Medicine Rehab  Bilateral hand numbness Assessment & Plan: Seems to be positional based on his description.  Possibly related to carpal tunnel syndrome.  I will write a prescription for cock-up splints to use bilaterally at night.  If not improving he will let us know.  Orders: -     Wrist Splint/Cock-Up/Left L; Use nightly  Dispense: 1 each; Refill: 0 -     Wrist Splint/Cock-Up/Right L; Use nightly  Dispense: 1 each; Refill: 0  Onychomycosis Assessment & Plan: Suspect onychomycosis of his fingernail.  Discussed treatment options including topicals and oral medications.  Patient opted for oral medication.  I did discuss the risk of damage to his liver with this kind of medication.  Will check hepatic function panel today and if acceptable I will send in Lamisil for him to start on.  He will have his his liver enzymes rechecked in 1 month.  If he develops any abdominal pain or new symptoms while on the Lamisil he will let us know right  away.  Orders: -     Hepatic function panel -     Hepatic function panel; Future -     Terbinafine HCl; Take 1 tablet (250 mg total) by mouth daily.  Dispense: 30 tablet; Refill: 0  Arthritis Assessment & Plan: Likely arthritis in his left middle finger.  We will see how this responds to the prednisone we are using for his back.  If it does not resolve with this we can consider Tylenol moving forward.      Return in about 1 month (around 09/03/2022) for labs, 2 months PCP.  I have spent 42 minutes in the care of this patient regarding history taking, documentation, completion of exam, discussion of plan, placing orders, counseling on medication.   Tommi Rumps, MD [place]

## 2022-08-03 NOTE — Assessment & Plan Note (Signed)
Seems to be positional based on his description.  Possibly related to carpal tunnel syndrome.  I will write a prescription for cock-up splints to use bilaterally at night.  If not improving he will let us know.

## 2022-08-03 NOTE — Assessment & Plan Note (Signed)
Suspect onychomycosis of his fingernail.  Discussed treatment options including topicals and oral medications.  Patient opted for oral medication.  I did discuss the risk of damage to his liver with this kind of medication.  Will check hepatic function panel today and if acceptable I will send in Lamisil for him to start on.  He will have his his liver enzymes rechecked in 1 month.  If he develops any abdominal pain or new symptoms while on the Lamisil he will let us know right away.

## 2022-08-03 NOTE — Assessment & Plan Note (Signed)
Patient has chronic low back pain with sciatica.  Exam indicates nerve impingement.  Will treat with prednisone 40 mg daily for 5 days.  Discussed risk of sleep issues and agitation.  If he gets excessively agitated or if he has trouble sleeping he will let us know.  Will refer for physical therapy and for evaluation for possible repeat back injection.

## 2022-08-05 ENCOUNTER — Ambulatory Visit: Payer: Medicare Other | Attending: Cardiovascular Disease | Admitting: Cardiovascular Disease

## 2022-08-05 ENCOUNTER — Encounter: Payer: Self-pay | Admitting: Cardiovascular Disease

## 2022-08-05 VITALS — BP 160/74 | HR 95 | Ht 65.0 in | Wt 191.1 lb

## 2022-08-05 DIAGNOSIS — I10 Essential (primary) hypertension: Secondary | ICD-10-CM | POA: Insufficient documentation

## 2022-08-05 DIAGNOSIS — Z952 Presence of prosthetic heart valve: Secondary | ICD-10-CM | POA: Diagnosis present

## 2022-08-05 DIAGNOSIS — I25118 Atherosclerotic heart disease of native coronary artery with other forms of angina pectoris: Secondary | ICD-10-CM

## 2022-08-05 DIAGNOSIS — E785 Hyperlipidemia, unspecified: Secondary | ICD-10-CM

## 2022-08-05 MED ORDER — ROSUVASTATIN CALCIUM 40 MG PO TABS: 40.0000 mg | ORAL_TABLET | Freq: Every day | ORAL | 3 refills | Status: DC

## 2022-08-05 MED ORDER — LOSARTAN POTASSIUM 100 MG PO TABS: 100.0000 mg | ORAL_TABLET | Freq: Every day | ORAL | 3 refills | Status: DC

## 2022-08-05 NOTE — Progress Notes (Signed)
Cardiology Office Note   Date:  08/05/2022   ID:  James Yates, DOB 04-20-1956, MRN [id number]  PCP: None Cardiologist:   [name], MD   Chief Complaint  Patient presents with   2 month follow up     Patient c/o shortness of breath & chest discomfort mostly with exertion. Medications reviewed by the patient verbally.       History of Present Illness: James Yates is a 67 y.o. male who presents for a followup visit regarding coronary artery disease status post CABG in February of 2015 after NSTEMI and severe aortic stenosis due to bicuspid aortic valve status post TAVR in October 2019. Other medical problems include hypertension, hyperlipidemia and RBBB . Cardiac catheterization in July 2019 showed significant underlying three-vessel coronary artery disease with patent LIMA to LAD, patent SVG to RCA and known chronically occluded SVG to OM 3 with occluded native left circumflex.  He had a ZIO monitor done in November of 2021 which showed 10 episodes of SVT.  Echocardiogram also November, 2021 showed an EF of 50 to 55% with normal functioning TAVR prosthesis.    He was seen in September 2023 with exertional chest pain.  Lexiscan Myoview showed no evidence of ischemia.  He was seen in December with shoulder pain, headache and intermittent bleeding from his gums.  Amlodipine was discontinued to see if that would help his gingival bleeding. He was prescribed Ranexa but could not afford the medication.  He was started on Imdur 30 mg daily but had worsening headache since then.  He reports stable exertional chest pain and shortness of breath with overexertion.  Past Medical History:  Diagnosis Date   Coronary artery disease    s/p CABG   Glucose intolerance (impaired glucose tolerance)    Headache    chronic, no red flag or atypical signs or symptoms   Hyperlipidemia    Hypertension    Obesity    S/P TAVR (transcatheter aortic valve replacement)    26 mm Edwards Sapien  3 transcatheter heart valve placed via percutaneous right transfemoral approach    Severe aortic stenosis     Past Surgical History:  Procedure Laterality Date   CARDIAC CATHETERIZATION  06/2013   [facility]   CARDIAC CATHETERIZATION N/A 10/29/2015   Procedure: Left Heart Cath and Cors/Grafts Angiography;  Surgeon: Wellington Hampshire, MD;  Location: [place] [facility];  Service: Cardiovascular;  Laterality: N/A;   CORONARY ARTERY BYPASS GRAFT N/A 07/05/2013   Procedure: CORONARY ARTERY BYPASS GRAFTING (CABG);  Surgeon: Ivin Poot, MD;  Location: [place];  Service: Open Heart Surgery;  Laterality: N/A;   INTRAOPERATIVE TRANSESOPHAGEAL ECHOCARDIOGRAM N/A 07/05/2013   Procedure: INTRAOPERATIVE TRANSESOPHAGEAL ECHOCARDIOGRAM;  Surgeon: Ivin Poot, MD;  Location: [place];  Service: Open Heart Surgery;  Laterality: N/A;   INTRAOPERATIVE TRANSTHORACIC ECHOCARDIOGRAM N/A 03/13/2018   Procedure: INTRAOPERATIVE TRANSTHORACIC ECHOCARDIOGRAM;  Surgeon: Burnell Blanks, MD;  Location: [place];  Service: Open Heart Surgery;  Laterality: N/A;   NO PAST SURGERIES     RIGHT/LEFT HEART CATH AND CORONARY ANGIOGRAPHY Bilateral 11/27/2017   Procedure: RIGHT/LEFT HEART CATH AND CORONARY ANGIOGRAPHY;  Surgeon: Wellington Hampshire, MD;  Location: [place] [facility];  Service: Cardiovascular;  Laterality: Bilateral;   TRANSCATHETER AORTIC VALVE REPLACEMENT, TRANSFEMORAL N/A 03/13/2018   Procedure: TRANSCATHETER AORTIC VALVE REPLACEMENT, TRANSFEMORAL. Edwards Sapien 3 Transcatheter Heart Valve size 39m.;  Surgeon: MBurnell Blanks MD;  [place]  Service: Open Heart Surgery;  Laterality: N/A;  Current Outpatient Medications  Medication Sig Dispense Refill   Acetaminophen (TYLENOL PO) Take by mouth as needed.     aspirin EC 81 MG tablet Take 1 tablet (81 mg total) by mouth daily. 30 tablet 3   Elastic Bandages & Supports (WRIST SPLINT/COCK-UP/LEFT L) MISC Use nightly 1 each 0   Elastic Bandages &  Supports (WRIST SPLINT/COCK-UP/RIGHT L) MISC Use nightly 1 each 0   losartan (COZAAR) 50 MG tablet Take 1 tablet (50 mg total) by mouth daily. 90 tablet 3   metoprolol tartrate (LOPRESSOR) 100 MG tablet Take 1 tablet (100 mg total) by mouth 2 (two) times daily. 180 tablet 1   pantoprazole (PROTONIX) 40 MG tablet Take 1 tablet (40 mg total) by mouth daily. 90 tablet 3   rosuvastatin (CRESTOR) 20 MG tablet Take 1 tablet (20 mg total) by mouth daily. 90 tablet 3   sertraline (ZOLOFT) 100 MG tablet Take 1 tablet (100 mg total) by mouth daily. 30 tablet 3   terbinafine (LAMISIL) 250 MG tablet Take 1 tablet (250 mg total) by mouth daily. 30 tablet 0   No current facility-administered medications for this visit.    Allergies:   Patient has no known allergies.    Social History:  The patient  reports that he has never smoked. He has never used smokeless tobacco. He reports that he does not drink alcohol and does not use drugs.   Family History:  The patient's family history includes Heart attack in his father; Heart disease in his father.    ROS:  Please see the history of present illness.   Otherwise, review of systems are positive for none.   All other systems are reviewed and negative.    PHYSICAL EXAM: VS:  BP (!) 160/74 (BP Location: Left Arm, Patient Position: Sitting, Cuff Size: Normal)   Pulse 95   Ht '5\' 5"'$  (1.651 m)   Wt 191 lb 2 oz (86.7 kg)   SpO2 98%   BMI 31.80 kg/m  , BMI Body mass index is 31.8 kg/m. GEN: Well nourished, well developed, in no acute distress  HEENT: normal  Neck: no JVD, carotid bruits, or masses Cardiac: RRR; no  rubs, or gallops,no edema . There is a 2/6 systolic aortic murmur which is a early peaking. Respiratory:  clear to auscultation bilaterally, normal work of breathing GI: soft, nontender, nondistended, + BS MS: no deformity or atrophy  Skin: warm and dry, no rash Neuro:  Strength and sensation are intact Psych: euthymic mood, full  affect   EKG:  EKG is  ordered today. EKG done showed sinus tachycardia with right bundle branch block and old inferior infarct.  No significant change from before.  Recent Labs: 06/17/2022: BUN 12; Creatinine, Ser 1.00; Hemoglobin 16.3; Platelets 172.0; Potassium 3.6; Sodium 143 08/03/2022: ALT 14    Lipid Panel    Component Value Date/Time   CHOL 223 (H) 04/11/2022 1057   CHOL 193 03/31/2020 1019   CHOL 215 (H) 07/02/2013 0058   TRIG 274 (H) 04/11/2022 1057   TRIG 143 07/02/2013 0058   HDL 42 04/11/2022 1057   HDL 48 03/31/2020 1019   HDL 38 (L) 07/02/2013 0058   CHOLHDL 5.3 04/11/2022 1057   VLDL 55 (H) 04/11/2022 1057   VLDL 29 07/02/2013 0058   LDLCALC 126 (H) 04/11/2022 1057   LDLCALC 117 (H) 03/31/2020 1019   LDLCALC 148 (H) 07/02/2013 0058      Wt Readings from Last 3 Encounters:  08/05/22 191 lb  2 oz (86.7 kg)  08/03/22 191 lb 6.4 oz (86.8 kg)  06/17/22 196 lb 9.6 oz (89.2 kg)        ASSESSMENT AND PLAN:  1.  Status post TAVR for severe aortic stenosis:  Continue with antibiotic prophylaxis before dental procedures.  Most recent echocardiogram in November 2023 showed normal LV systolic function with normal functioning TAVR prosthesis and mean gradient of 11 mmHg.  2. Coronary artery disease involving native coronary arteries with stable angina: He continues to have class II angina likely due to occluded left circumflex and graft to OM 3.  He is not tolerating Imdur due to headache.  This was discontinued today.  I elected to increase losartan to 100 mg once daily to control his blood pressure.  Continue metoprolol.  If blood pressure remains elevated, recommend resuming small dose amlodipine.  Cardiac catheterization can be considered if angina persists after controlling his blood pressure and maximizing antianginal therapy.  3. Essential hypertension: He did not take his blood pressure medications today.  I increased losartan.  4. Hyperlipidemia: He reports  taking rosuvastatin 20 mg daily with no issues.  Most recent LDL was above 100.  I increased the dose to 40 mg daily.  Will have to watch for any myalgia    Disposition:   FU with me in 4 months  Signed,  [name], MD  08/05/2022 8:43 AM    [place]

## 2022-08-05 NOTE — Patient Instructions (Signed)
Medication Instructions:  Your physician recommends the following medication changes.  STOP TAKING: Isosorbide (Imdur)  INCREASE: Rosuvastatin to 40 mg once daily Losartan to 100 mg once daily  *If you need a refill on your cardiac medications before your next appointment, please call your pharmacy*   Lab Work: None ordered If you have labs (blood work) drawn today and your tests are completely normal, you will receive your results only by: [place] (if you have MyChart) OR A paper copy in the mail If you have any lab test that is abnormal or we need to change your treatment, we will call you to review the results.   Testing/Procedures: None ordered   Follow-Up: At [facility], you and your health needs are our priority.  As part of our continuing mission to provide you with exceptional heart care, we have created designated Provider Care Teams.  These Care Teams include your primary Cardiologist (physician) and Advanced Practice Providers (APPs -  Physician Assistants and Nurse Practitioners) who all work together to provide you with the care you need, when you need it.  We recommend signing up for the patient portal called "MyChart".  Sign up information is provided on this After Visit Summary.  MyChart is used to connect with patients for Virtual Visits (Telemedicine).  Patients are able to view lab/test results, encounter notes, upcoming appointments, etc.  Non-urgent messages can be sent to your provider as well.   To learn more about what you can do with MyChart, go to [email or web address].    Your next appointment:   4 month(s)  Provider:   You may see [name], MD or one of the following Advanced Practice Providers on your designated Care Team:   Murray Hodgkins, NP Christell Faith, PA-C Cadence Kathlen Mody, PA-C Gerrie Nordmann, NP

## 2022-08-18 ENCOUNTER — Encounter: Payer: Self-pay | Admitting: Physical Medicine & Rehabilitation

## 2022-08-29 ENCOUNTER — Telehealth: Payer: Self-pay | Admitting: Family Medicine

## 2022-08-29 NOTE — Telephone Encounter (Signed)
Called patient to schedule Medicare Annual Wellness Visit (AWV). No voicemail available to leave a message.  Last date of AWV: 07/14/2021  Please schedule an AWVS appointment at any time with [facility] [place].  If any questions, please contact me at [phone].  Thank you,  [place] Direct dial  [phone]

## 2022-09-05 ENCOUNTER — Other Ambulatory Visit (INDEPENDENT_AMBULATORY_CARE_PROVIDER_SITE_OTHER): Payer: Medicare Other

## 2022-09-05 ENCOUNTER — Other Ambulatory Visit: Payer: Self-pay | Admitting: Family Medicine

## 2022-09-05 DIAGNOSIS — B351 Tinea unguium: Secondary | ICD-10-CM

## 2022-09-05 LAB — HEPATIC FUNCTION PANEL
ALT: 19 U/L (ref 0–53)
AST: 18 U/L (ref 0–37)
Albumin: 4.6 g/dL (ref 3.5–5.2)
Alkaline Phosphatase: 94 U/L (ref 39–117)
Bilirubin, Direct: 0.1 mg/dL (ref 0.0–0.3)
Total Bilirubin: 0.7 mg/dL (ref 0.2–1.2)
Total Protein: 7.1 g/dL (ref 6.0–8.3)

## 2022-09-05 MED ORDER — TERBINAFINE HCL 250 MG PO TABS: 250.0000 mg | ORAL_TABLET | Freq: Every day | ORAL | 0 refills | Status: DC

## 2022-09-06 ENCOUNTER — Encounter: Payer: Self-pay | Admitting: Physical Medicine & Rehabilitation

## 2022-09-06 ENCOUNTER — Encounter: Payer: Medicare Other | Attending: Physical Medicine & Rehabilitation | Admitting: Physical Medicine & Rehabilitation

## 2022-09-06 VITALS — BP 142/88 | HR 104 | Ht 65.0 in | Wt 186.0 lb

## 2022-09-06 DIAGNOSIS — M5441 Lumbago with sciatica, right side: Secondary | ICD-10-CM | POA: Insufficient documentation

## 2022-09-06 DIAGNOSIS — G8929 Other chronic pain: Secondary | ICD-10-CM | POA: Diagnosis present

## 2022-09-06 DIAGNOSIS — M24551 Contracture, right hip: Secondary | ICD-10-CM | POA: Diagnosis present

## 2022-09-06 NOTE — Progress Notes (Signed)
Pain Inventory Average Pain 8 Pain Right Now 8 My pain is  unable to describe, goes down leg   In the last 24 hours, has pain interfered with the following? General activity 8 Relation with others 8 Enjoyment of life 9 What TIME of day is your pain at its worst? morning  Sleep (in general) Poor  Pain is worse with: walking Pain improves with: medication Relief from Meds: 2  walk without assistance use a cane how many minutes can you walk? Not much  ability to climb steps?  yes do you drive?  yes  retired I need assistance with the following:  does everything himself   numbness tremor tingling trouble walking dizziness  Any changes since last visit?  no  Any changes since last visit?  no    Family History  Problem Relation Age of Onset   Heart disease Father    Heart attack Father    Social History   Socioeconomic History   Marital status: Married    Spouse name: Not on file   Number of children: Not on file   Years of education: Not on file   Highest education level: Not on file  Occupational History   Not on file  Tobacco Use   Smoking status: Never   Smokeless tobacco: Never  Vaping Use   Vaping Use: Never used  Substance and Sexual Activity   Alcohol use: No   Drug use: No   Sexual activity: Not on file  Other Topics Concern   Not on file  Social History Narrative   Not on file   Social Determinants of Health   Financial Resource Strain: Not on file  Food Insecurity: Not on file  Transportation Needs: Not on file  Physical Activity: Not on file  Stress: Not on file  Social Connections: Not on file   Past Surgical History:  Procedure Laterality Date   C[facility]ATION  [facility]/2013   [facility]   CARDIAC CATHETERIZATION N/A 10/29/2015   Procedure: Left Heart Cath and Cors/Grafts Angiography;  Surgeon: Iran Ouch, MD;  Location: [facility];  Service: Cardiovascular;  Laterality: N/A;   CORONARY ARTERY BYPASS GRAFT N/A 07/05/2013    Procedure: CORONARY ARTERY BYPASS GRAFTING (CABG);  Surgeon: [name], MD;  Location: [facility];  Service: Open Heart Surgery;  Laterality: N/A;   INTRAOPERATIVE TRANSESOPHAGEAL ECHOCARDIOGRAM N/A 07/05/2013   Procedure: INTRAOPERATIVE TRANSESOPHAGEAL ECHOCARDIOGRAM;  Surgeon: [name], MD;  Location[facility];  Service: Open Heart Surgery;  Laterality: N/A;   INTRAOPERATIVE TRANSTHORACIC ECHOCARDIOGRAM N/A 03/13/2018   Procedure: INTRAOPERATIVE TRANSTHORACIC ECHOCARDIOGRAM;  Surgeon: Kathleene Hazel, MD;  Location: [facility];  Service: Open Heart Surgery;  Laterality: N/A;   NO PAST SURGERIES     RIGHT/LEFT HEART CATH AND CORONARY ANGIOGRAPHY Bilateral 11/27/2017   Procedure: RIGHT/LEFT HEART CATH AND CORONARY ANGIOGRAPHY;  Surgeon: Iran Ouch, MD;  Location: [facility];  Service: Cardiovascular;  Laterality: Bilateral;   TRANSCATHETER AORTIC VALVE REPLACEMENT, TRANSFEMORAL N/A 03/13/2018   Procedure: TRANSCATHETER AORTIC VALVE REPLACEMENT, TRANSFEMORAL. Edwards Sapien 3 Transcatheter Heart Valve size 72mm.;  Surgeon: Kathleene Hazel, MD;  Location: [facility];  Service: Open Heart Surgery;  Laterality: N/A;   Past Medical History:  Diagnosis Date   Coronary artery disease    s/p CABG   Glucose intolerance (impaired glucose tolerance)    Headache    chronic, no red flag or atypical signs or symptoms   Hyperlipidemia    Hypertension  Obesity    S/P TAVR (transcatheter aortic valve replacement)    26 mm Edwards Sapien 3 transcatheter heart valve placed via percutaneous right transfemoral approach    Severe aortic stenosis    Ht 5\' 5"  (1.651 m)   Wt 186 lb (84.4 kg)   BMI 30.95 kg/m   Opioid Risk Score:   Fall Risk Score:  `1  Depression screen [facility] 2/9     08/03/2022   11:15 AM 06/17/2022   11:14 AM 07/05/2019    3:34 PM 05/28/2019   10:25 AM 06/28/2018    6:02 PM 05/07/2018   11:31 AM  Depression screen PHQ 2/9  Decreased Interest 0 0 0 0 0 3   Down, Depressed, Hopeless 0 0 0 0 0 1  PHQ - 2 Score 0 0 0 0 0 4  Altered sleeping 0    0 1  Tired, decreased energy 0    0 1  Change in appetite 0    1 2  Feeling bad or failure about yourself  0    0 0  Trouble concentrating 0    1 0  Moving slowly or fidgety/restless 0    2 2  Suicidal thoughts 0    0 0  PHQ-9 Score 0    4 10  Difficult doing work/chores Not difficult at all    Not difficult at all Somewhat difficult     Subjective:    Patient ID: James Yates, male    DOB: July 20, 1955, 67 y.o.   MRN: [id number] Here with Spanish language interpreter. HPI  CC:  Right sided low back pain  Next he 11-year-old male with history of coronary artery disease status post CABG, hypertension, aortic stenosis status post transcatheter aortic valve replacement who was referred by his primary MD to evaluate chronic low back pain. 3-4 yr hx of insidious onset Low back pain.  His pain is worse on the right side of his back.  Mild weakness in the Right leg, no loss of sensation unless he is sitting too long.  His loss of sensation when he is sitting too long involves the entire leg. He was seen by Dr. Mariah Milling at [facility] physical medicine and rehab in [place].  He was referred to physical therapy but did not attend this. The patient is independent with all self-care and mobility.  He does not use an assistive device.  His ambulation distance is limited by his pain.  Walking tends to aggravate the pain.  No NSAIDs due to CAD, has taken occasional Tylenol for headaches but has not tried it for his back pain., takes a baby ASA  Review of Systems  Musculoskeletal:  Positive for back pain and gait problem.  All other systems reviewed and are negative.     Objective:   Physical Exam  Well-developed well-nourished mildly obese male in no acute distress Mood and affect are appropriate Lumbar spine no evidence of scoliosis or kyphosis.  No tenderness along the thoracic paraspinals there is tenderness  around the lumbar paraspinal starting around L4 going down to L5-S1 area.  No PSIS tenderness Positive Faber's right side SI region Positive thigh thrust right side Faber's also causes right groin pain Distraction test causes right SI joint pain. There is mildly diminished range of motion in the right hip with internal/external rotation.  Normal mobility in the left hip.  Normal knee and ankle movement. Negative straight leg raise bilaterally Right lower extremity is 4/5 in the hip flexor knee extensor ankle  dorsiflexor limited by pain Left side is 5/5 in the same muscle groups Deep tendon reflexes 2+ bilateral knees and ankles Sensation reduced right L5 dermatome to light touch. Ambulates without assistive device no evidence of toe drag or knee instability Patient needs total assist going from supine to sit due to abdominal musculature weakness Lumbar range of motion 25% flexion extension lateral bending and rotation all motions are painful for him.     Assessment & Plan:  #1.  Chronic low back pain mainly right-sided appears to be multifactorial.  He has some sciatic complaints as well as reduced sensation in the right L5 dermatomal distribution. In addition the patient has 3/5 SI provocative test positive Reviewed x-rays which showed mild disc height loss in the lower lumbar levels no evidence of scoliosis. Patient has decreased lumbar and right hip range of motion as well as core strength Will make referral to physical therapy Check hip x-rays See him back in 1 month if no better consider MRI of the lumbar spine.  Discussed with patient as well as the interpreter agree with plan.

## 2022-09-08 ENCOUNTER — Ambulatory Visit
Admission: RE | Admit: 2022-09-08 | Discharge: 2022-09-08 | Disposition: A | Discharge status: Home / Self Care | Payer: Medicare Other | Source: Ambulatory Visit | Attending: Physical Medicine & Rehabilitation | Admitting: Physical Medicine & Rehabilitation

## 2022-09-08 ENCOUNTER — Ambulatory Visit
Admission: RE | Admit: 2022-09-08 | Discharge: 2022-09-08 | Disposition: A | Discharge status: Home / Self Care | Payer: Medicare Other | Attending: Physical Medicine & Rehabilitation | Admitting: Physical Medicine & Rehabilitation

## 2022-09-08 DIAGNOSIS — M24551 Contracture, right hip: Secondary | ICD-10-CM | POA: Insufficient documentation

## 2022-09-30 ENCOUNTER — Encounter: Payer: Self-pay | Admitting: Family Medicine

## 2022-09-30 ENCOUNTER — Ambulatory Visit (INDEPENDENT_AMBULATORY_CARE_PROVIDER_SITE_OTHER): Payer: Medicare Other | Admitting: Family Medicine

## 2022-09-30 VITALS — BP 126/82 | HR 66 | Temp 98.5°F | Ht 65.0 in | Wt 192.8 lb

## 2022-09-30 DIAGNOSIS — M5442 Lumbago with sciatica, left side: Secondary | ICD-10-CM | POA: Diagnosis not present

## 2022-09-30 DIAGNOSIS — M5441 Lumbago with sciatica, right side: Secondary | ICD-10-CM

## 2022-09-30 DIAGNOSIS — G8929 Other chronic pain: Secondary | ICD-10-CM

## 2022-09-30 DIAGNOSIS — K219 Gastro-esophageal reflux disease without esophagitis: Secondary | ICD-10-CM

## 2022-09-30 DIAGNOSIS — B351 Tinea unguium: Secondary | ICD-10-CM

## 2022-09-30 DIAGNOSIS — M5412 Radiculopathy, cervical region: Secondary | ICD-10-CM

## 2022-09-30 DIAGNOSIS — F419 Anxiety disorder, unspecified: Secondary | ICD-10-CM

## 2022-09-30 DIAGNOSIS — F32A Depression, unspecified: Secondary | ICD-10-CM

## 2022-09-30 LAB — HEPATIC FUNCTION PANEL
ALT: 20 U/L (ref 0–53)
AST: 17 U/L (ref 0–37)
Albumin: 4.3 g/dL (ref 3.5–5.2)
Alkaline Phosphatase: 100 U/L (ref 39–117)
Bilirubin, Direct: 0.1 mg/dL (ref 0.0–0.3)
Total Bilirubin: 0.7 mg/dL (ref 0.2–1.2)
Total Protein: 7.1 g/dL (ref 6.0–8.3)

## 2022-09-30 MED ORDER — PREDNISONE 20 MG PO TABS
40.0000 mg | ORAL_TABLET | Freq: Every day | ORAL | 0 refills | Status: DC
Start: 2022-09-30 — End: 2023-02-07

## 2022-09-30 MED ORDER — PANTOPRAZOLE SODIUM 40 MG PO TBEC
DELAYED_RELEASE_TABLET | ORAL | 3 refills | Status: DC
Start: 2022-09-30 — End: 2023-01-18

## 2022-09-30 NOTE — Progress Notes (Signed)
Marikay Alar, MD Phone: [phone]  James Yates is a 67 y.o. male who presents today for f/u.  Spanish interpreter used through video.  Anxiety/depression: Patient notes still some depression though it is improving.  He notes he is much more calm than he was previously.  The Zoloft has been helpful.  He is happy with where he is at with this.  No SI.  Chronic back pain: He did see physical medicine and rehab they recommended physical therapy though the patient has not gotten a call regarding this.  Notes his back does still bother him some.  Posterior headache/neck pain: Patient notes the neck pain has been going on for some time though the headache has been over the last several days.  Notes he gets a shooting pain in his neck and head.  No radiation to his arms.  He has chronic stable numbness and weakness in his arms.  He notes no pain in his neck or head if he is resting.  GERD: Patient notes a bitter taste in his mouth at night and has burning in his chest.  This has been going on for a number of days now.  No dysphagia or blood in his stool.  He takes Protonix once daily prior to bed.  Social History   Tobacco Use  Smoking Status Never  Smokeless Tobacco Never    Current Outpatient Medications on File Prior to Visit  Medication Sig Dispense Refill   aspirin EC 81 MG tablet Take 1 tablet (81 mg total) by mouth daily. 30 tablet 3   Elastic Bandages & Supports (WRIST SPLINT/COCK-UP/LEFT L) MISC Use nightly 1 each 0   Elastic Bandages & Supports (WRIST SPLINT/COCK-UP/RIGHT L) MISC Use nightly 1 each 0   losartan (COZAAR) 100 MG tablet Take 1 tablet (100 mg total) by mouth daily. 90 tablet 3   metoprolol tartrate (LOPRESSOR) 100 MG tablet Take 1 tablet (100 mg total) by mouth 2 (two) times daily. 180 tablet 1   rosuvastatin (CRESTOR) 40 MG tablet Take 1 tablet (40 mg total) by mouth daily. 90 tablet 3   sertraline (ZOLOFT) 100 MG tablet Take 1 tablet (100 mg total) by mouth  daily. 30 tablet 3   terbinafine (LAMISIL) 250 MG tablet Take 1 tablet (250 mg total) by mouth daily. 56 tablet 0   Acetaminophen (TYLENOL PO) Take by mouth as needed. (Patient not taking: Reported on 09/06/2022)     No current facility-administered medications on file prior to visit.     ROS see history of present illness  Objective  Physical Exam Vitals:   09/30/22 0902 09/30/22 0918  BP: 124/84 126/82  Pulse: 66   Temp: 98.5 F (36.9 C)   SpO2: 96%     BP Readings from Last 3 Encounters:  09/30/22 126/82  09/06/22 (!) 142/88  08/05/22 (!) 160/74   Wt Readings from Last 3 Encounters:  09/30/22 192 lb 12.8 oz (87.5 kg)  09/06/22 186 lb (84.4 kg)  08/05/22 191 lb 2 oz (86.7 kg)    Physical Exam Constitutional:      General: He is not in acute distress.    Appearance: He is not diaphoretic.  Cardiovascular:     Rate and Rhythm: Normal rate and regular rhythm.     Heart sounds: Normal heart sounds.  Pulmonary:     Effort: Pulmonary effort is normal.     Breath sounds: Normal breath sounds.  Abdominal:     General: Bowel sounds are normal. There is no  distension.     Palpations: Abdomen is soft.     Tenderness: There is abdominal tenderness (Slight tenderness in epigastric region).  Musculoskeletal:     Comments: No midline neck tenderness, no midline neck step-off, no muscular neck tenderness  Skin:    General: Skin is warm and dry.  Neurological:     Mental Status: He is alert.     Comments: CN 3-12 intact, 5/5 strength in bilateral biceps, triceps, grip, quads, hamstrings, plantar and dorsiflexion, sensation to light touch intact in bilateral UE and LE, normal gait, 2+ patellar, biceps, and brachioradialis reflexes      Assessment/Plan: Please see individual problem list.  Anxiety and depression Assessment & Plan: Chronic issue.  Much improved.  He will continue Zoloft 100 mg daily.  He will monitor.   Chronic bilateral low back pain with bilateral  sciatica Assessment & Plan: Chronic issue.  Patient saw physical medicine and rehab.  Discussed getting him into see physical therapy to see if that will help with this.  Orders: -     Ambulatory referral to Physical Therapy  Cervical radiculopathy Assessment & Plan: Chronic neck pain.  Discussed that this is likely contributing to his headache.  We will give him a 5-day course of prednisone 40 mg daily to see if that will help resolve his symptoms.  If he continues to have some symptoms after taking this course he will let us know and we could consider a more long-term NSAID.  Orders: -     predniSONE; Take 2 tablets (40 mg total) by mouth daily with breakfast.  Dispense: 10 tablet; Refill: 0 -     Ambulatory referral to Physical Therapy  Gastroesophageal reflux disease, unspecified whether esophagitis present Assessment & Plan: Chronic issue.  Somewhat worsened recently.  Discussed taking Protonix twice daily 30 minutes before breakfast and 30 minutes before dinner.  He will do this for 1 month and then go back to Protonix once daily before breakfast.  If not improving he will let us know.  Possibly with gastritis as well with discomfort in epigastric region.  We will see if this resolves with his Protonix.  Orders: -     Pantoprazole Sodium; Take 1 tablet (40 mg total) by mouth 2 (two) times daily before a meal for 30 days, THEN 1 tablet (40 mg total) daily before breakfast.  Dispense: 90 tablet; Refill: 3  Onychomycosis Assessment & Plan: Check hepatic function panel given epigastric tenderness.  Orders: -     Hepatic function panel   Return in about 3 months (around 12/31/2022).   Marikay Alar, MD [facility]

## 2022-09-30 NOTE — Assessment & Plan Note (Signed)
Chronic issue.  Somewhat worsened recently.  Discussed taking Protonix twice daily 30 minutes before breakfast and 30 minutes before dinner.  He will do this for 1 month and then go back to Protonix once daily before breakfast.  If not improving he will let us know.  Possibly with gastritis as well with discomfort in epigastric region.  We will see if this resolves with his Protonix.

## 2022-09-30 NOTE — Assessment & Plan Note (Signed)
Chronic issue.  Patient saw physical medicine and rehab.  Discussed getting him into see physical therapy to see if that will help with this.

## 2022-09-30 NOTE — Assessment & Plan Note (Signed)
Chronic issue.  Much improved.  He will continue Zoloft 100 mg daily.  He will monitor.

## 2022-09-30 NOTE — Assessment & Plan Note (Signed)
Chronic neck pain.  Discussed that this is likely contributing to his headache.  We will give him a 5-day course of prednisone 40 mg daily to see if that will help resolve his symptoms.  If he continues to have some symptoms after taking this course he will let us know and we could consider a more long-term NSAID.

## 2022-09-30 NOTE — Patient Instructions (Signed)
Nice to see you. I have referred you to physical therapy again.  If you do not hear from them in the next couple of weeks please let us know. Please try the prednisone to see if that helps resolve your neck pain and headache.  If you still have symptoms after finishing the prednisone please let us know. We will have you take pantoprazole 40 mg twice daily 30 minutes before breakfast and 30 minutes before dinner.  You will do this for 30 days.  After that you will take this once daily 30 minutes before breakfast. Will contact you with your lab results.

## 2022-09-30 NOTE — Assessment & Plan Note (Signed)
Check hepatic function panel given epigastric tenderness.

## 2022-10-06 ENCOUNTER — Encounter: Payer: Medicare Other | Attending: Physical Medicine & Rehabilitation | Admitting: Physical Medicine & Rehabilitation

## 2022-10-06 DIAGNOSIS — M5441 Lumbago with sciatica, right side: Secondary | ICD-10-CM | POA: Insufficient documentation

## 2022-10-06 DIAGNOSIS — G8929 Other chronic pain: Secondary | ICD-10-CM | POA: Insufficient documentation

## 2022-10-06 DIAGNOSIS — M24551 Contracture, right hip: Secondary | ICD-10-CM | POA: Insufficient documentation

## 2022-10-14 ENCOUNTER — Telehealth: Payer: Self-pay | Admitting: Cardiovascular Disease

## 2022-10-14 NOTE — Telephone Encounter (Signed)
   Pre-operative Risk Assessment    Patient Name: James Yates  DOB: 19-Sep-1955 MRN: [id number]{      Request for Surgical Clearance    Procedure:   DRY NEEDLING  Date of Surgery:  Clearance TBD                               Surgeon:  NOT INDICATED Surgeon's Group or Practice Name:  [facility] Phone number:  [phone] Fax number:  [phone]  Type of Clearance Requested:   - Medical    Type of Anesthesia:  Not Indicated   Additional requests/questions:    Signed, Dalia Heading   10/14/2022, 10:58 AM

## 2022-10-14 NOTE — Telephone Encounter (Signed)
   Patient Name: James Yates  DOB: 05/23/1956 MRN: [id number]  Primary Cardiologist: Lorine Bears, MD  Chart reviewed as part of pre-operative protocol coverage. Dry needling does not typically require specific preoperative testing or holding of blood thinner therapy since it is a low risk procedure that does not require anesthesia.   I will route this recommendation to the requesting party via Epic fax function and remove from pre-op pool.  Please call with questions.  Levi Aland, NP-C  10/14/2022, 2:16 PM [address] Office [phone] Fax [phone]

## 2022-11-20 ENCOUNTER — Other Ambulatory Visit: Payer: Self-pay | Admitting: Family Medicine

## 2022-11-20 DIAGNOSIS — F32A Depression, unspecified: Secondary | ICD-10-CM

## 2022-12-08 ENCOUNTER — Ambulatory Visit: Payer: Medicare Other | Admitting: Cardiovascular Disease

## 2022-12-08 NOTE — Progress Notes (Deleted)
Cardiology Office Note   Date:  12/08/2022   ID:  James Yates, DOB 06-Jan-1956, MRN [id number]  PCP: None Cardiologist:   Lorine Bears, MD   No chief complaint on file.     History of Present Illness: James Yates is a 67 y.o. male who presents for a followup visit regarding coronary artery disease status post CABG in February of 2015 after NSTEMI and severe aortic stenosis due to bicuspid aortic valve status post TAVR in October 2019. Other medical problems include hypertension, hyperlipidemia and RBBB . Cardiac catheterization in July 2019 showed significant underlying three-vessel coronary artery disease with patent LIMA to LAD, patent SVG to RCA and known chronically occluded SVG to OM 3 with occluded native left circumflex.  He had a ZIO monitor done in November of 2021 which showed 10 episodes of SVT.    He was seen in September 2023 with exertional chest pain.  Lexiscan Myoview showed no evidence of ischemia.  He was seen in December with shoulder pain, headache and intermittent bleeding from his gums.  Amlodipine was discontinued to see if that would help his gingival bleeding. He was prescribed Ranexa but could not afford the medication.  He was started on Imdur 30 mg daily but had worsening headache since then.  He reports stable exertional chest pain and shortness of breath with overexertion.  Past Medical History:  Diagnosis Date   Coronary artery disease    s/p CABG   Glucose intolerance (impaired glucose tolerance)    Headache    chronic, no red flag or atypical signs or symptoms   Hyperlipidemia    Hypertension    Obesity    S/P TAVR (transcatheter aortic valve replacement)    26 mm Edwards Sapien 3 transcatheter heart valve placed via percutaneous right transfemoral approach    Severe aortic stenosis     Past Surgical History:  Procedure Laterality Date   CARDIAC CATHETERIZATION  06/2013   [facility]   CARDIAC CATHETERIZATION N/A 10/29/2015    Procedure: Left Heart Cath and Cors/Grafts Angiography;  Surgeon: Iran Ouch, MD;  Location: [facility] INVASIVE CV LAB;  Service: Cardiovascular;  Laterality: N/A;   CORONARY ARTERY BYPASS GRAFT N/A 07/05/2013   Procedure: CORONARY ARTERY BYPASS GRAFTING (CABG);  Surgeon: [name], MD;  Location: [facility] OR;  Service: Open Heart Surgery;  Laterality: N/A;   INTRAOPERATIVE TRANSESOPHAGEAL ECHOCARDIOGRAM N/A 07/05/2013   Procedure: INTRAOPERATIVE TRANSESOPHAGEAL ECHOCARDIOGRAM;  Surgeon: [name], MD;  Location: [facility] OR;  Service: Open Heart Surgery;  Laterality: N/A;   INTRAOPERATIVE TRANSTHORACIC ECHOCARDIOGRAM N/A 03/13/2018   Procedure: INTRAOPERATIVE TRANSTHORACIC ECHOCARDIOGRAM;  Surgeon: Kathleene Hazel, MD;  Location: [facility] OR;  Service: Open Heart Surgery;  Laterality: N/A;   NO PAST SURGERIES     RIGHT/LEFT HEART CATH AND CORONARY ANGIOGRAPHY Bilateral 11/27/2017   Procedure: RIGHT/LEFT HEART CATH AND CORONARY ANGIOGRAPHY;  Surgeon: Iran Ouch, MD;  Location: [facility] INVASIVE CV LAB;  Service: Cardiovascular;  Laterality: Bilateral;   TRANSCATHETER AORTIC VALVE REPLACEMENT, TRANSFEMORAL N/A 03/13/2018   Procedure: TRANSCATHETER AORTIC VALVE REPLACEMENT, TRANSFEMORAL. Edwards Sapien 3 Transcatheter Heart Valve size 26mm.;  Surgeon: Kathleene Hazel, MD;  Location: [facility] OR;  Service: Open Heart Surgery;  Laterality: N/A;     Current Outpatient Medications  Medication Sig Dispense Refill   Acetaminophen (TYLENOL PO) Take by mouth as needed. (Patient not taking: Reported on 09/06/2022)     aspirin EC 81 MG tablet Take 1 tablet (81 mg total) by mouth daily. 30  tablet 3   Elastic Bandages & Supports (WRIST SPLINT/COCK-UP/LEFT L) MISC Use nightly 1 each 0   Elastic Bandages & Supports (WRIST SPLINT/COCK-UP/RIGHT L) MISC Use nightly 1 each 0   losartan (COZAAR) 100 MG tablet Take 1 tablet (100 mg total) by mouth daily. 90 tablet 3   metoprolol tartrate (LOPRESSOR) 100 MG tablet  Take 1 tablet (100 mg total) by mouth 2 (two) times daily. 180 tablet 1   pantoprazole (PROTONIX) 40 MG tablet Take 1 tablet (40 mg total) by mouth 2 (two) times daily before a meal for 30 days, THEN 1 tablet (40 mg total) daily before breakfast. 90 tablet 3   predniSONE (DELTASONE) 20 MG tablet Take 2 tablets (40 mg total) by mouth daily with breakfast. 10 tablet 0   rosuvastatin (CRESTOR) 40 MG tablet Take 1 tablet (40 mg total) by mouth daily. 90 tablet 3   sertraline (ZOLOFT) 100 MG tablet Take 1 tablet by mouth once daily 30 tablet 0   terbinafine (LAMISIL) 250 MG tablet Take 1 tablet (250 mg total) by mouth daily. 56 tablet 0   No current facility-administered medications for this visit.    Allergies:   Patient has no known allergies.    Social History:  The patient  reports that he has never smoked. He has never used smokeless tobacco. He reports that he does not drink alcohol and does not use drugs.   Family History:  The patient's family history includes Heart attack in his father; Heart disease in his father.    ROS:  Please see the history of present illness.   Otherwise, review of systems are positive for none.   All other systems are reviewed and negative.    PHYSICAL EXAM: VS:  There were no vitals taken for this visit. , BMI There is no height or weight on file to calculate BMI. GEN: Well nourished, well developed, in no acute distress  HEENT: normal  Neck: no JVD, carotid bruits, or masses Cardiac: RRR; no  rubs, or gallops,no edema . There is a 2/6 systolic aortic murmur which is a early peaking. Respiratory:  clear to auscultation bilaterally, normal work of breathing GI: soft, nontender, nondistended, + BS MS: no deformity or atrophy  Skin: warm and dry, no rash Neuro:  Strength and sensation are intact Psych: euthymic mood, full affect   EKG:  EKG is  ordered today. EKG done showed sinus tachycardia with right bundle branch block and old inferior infarct.  No  significant change from before.  Recent Labs: 06/17/2022: BUN 12; Creatinine, Ser 1.00; Hemoglobin 16.3; Platelets 172.0; Potassium 3.6; Sodium 143 09/30/2022: ALT 20    Lipid Panel    Component Value Date/Time   CHOL 223 (H) 04/11/2022 1057   CHOL 193 03/31/2020 1019   CHOL 215 (H) 07/02/2013 0058   TRIG 274 (H) 04/11/2022 1057   TRIG 143 07/02/2013 0058   HDL 42 04/11/2022 1057   HDL 48 03/31/2020 1019   HDL 38 (L) 07/02/2013 0058   CHOLHDL 5.3 04/11/2022 1057   VLDL 55 (H) 04/11/2022 1057   VLDL 29 07/02/2013 0058   LDLCALC 126 (H) 04/11/2022 1057   LDLCALC 117 (H) 03/31/2020 1019   LDLCALC 148 (H) 07/02/2013 0058      Wt Readings from Last 3 Encounters:  09/30/22 192 lb 12.8 oz (87.5 kg)  09/06/22 186 lb (84.4 kg)  08/05/22 191 lb 2 oz (86.7 kg)        ASSESSMENT AND PLAN:  1.  Status post TAVR for severe aortic stenosis:  Continue with antibiotic prophylaxis before dental procedures.  Most recent echocardiogram in November 2023 showed normal LV systolic function with normal functioning TAVR prosthesis and mean gradient of 11 mmHg.  2. Coronary artery disease involving native coronary arteries with stable angina: He continues to have class II angina likely due to occluded left circumflex and graft to OM 3.  He is not tolerating Imdur due to headache.  This was discontinued today.  I elected to increase losartan to 100 mg once daily to control his blood pressure.  Continue metoprolol.  If blood pressure remains elevated, recommend resuming small dose amlodipine.  Cardiac catheterization can be considered if angina persists after controlling his blood pressure and maximizing antianginal therapy.  3. Essential hypertension: He did not take his blood pressure medications today.  I increased losartan.  4. Hyperlipidemia: He reports taking rosuvastatin 20 mg daily with no issues.  Most recent LDL was above 100.  I increased the dose to 40 mg daily.  Will have to watch for any  myalgia    Disposition:   FU with me in 4 months  Signed,  Lorine Bears, MD  12/08/2022 8:10 AM    [facility]

## 2022-12-19 ENCOUNTER — Other Ambulatory Visit: Payer: Self-pay | Admitting: Family Medicine

## 2022-12-19 DIAGNOSIS — F32A Depression, unspecified: Secondary | ICD-10-CM

## 2023-01-04 ENCOUNTER — Ambulatory Visit (INDEPENDENT_AMBULATORY_CARE_PROVIDER_SITE_OTHER): Payer: Medicare Other | Admitting: Family Medicine

## 2023-01-04 ENCOUNTER — Encounter: Payer: Self-pay | Admitting: Family Medicine

## 2023-01-04 VITALS — BP 130/80 | HR 68 | Temp 98.4°F | Ht 65.0 in | Wt 192.6 lb

## 2023-01-04 DIAGNOSIS — F419 Anxiety disorder, unspecified: Secondary | ICD-10-CM

## 2023-01-04 DIAGNOSIS — R7309 Other abnormal glucose: Secondary | ICD-10-CM | POA: Diagnosis not present

## 2023-01-04 DIAGNOSIS — M5412 Radiculopathy, cervical region: Secondary | ICD-10-CM

## 2023-01-04 DIAGNOSIS — R079 Chest pain, unspecified: Secondary | ICD-10-CM

## 2023-01-04 DIAGNOSIS — M5441 Lumbago with sciatica, right side: Secondary | ICD-10-CM

## 2023-01-04 DIAGNOSIS — M5442 Lumbago with sciatica, left side: Secondary | ICD-10-CM | POA: Diagnosis not present

## 2023-01-04 DIAGNOSIS — I10 Essential (primary) hypertension: Secondary | ICD-10-CM

## 2023-01-04 DIAGNOSIS — F32A Depression, unspecified: Secondary | ICD-10-CM

## 2023-01-04 DIAGNOSIS — G8929 Other chronic pain: Secondary | ICD-10-CM

## 2023-01-04 LAB — COMPREHENSIVE METABOLIC PANEL
ALT: 20 U/L (ref 0–53)
AST: 19 U/L (ref 0–37)
Albumin: 4.6 g/dL (ref 3.5–5.2)
Alkaline Phosphatase: 83 U/L (ref 39–117)
BUN: 14 mg/dL (ref 6–23)
CO2: 32 mEq/L (ref 19–32)
Calcium: 9.1 mg/dL (ref 8.4–10.5)
Chloride: 100 mEq/L (ref 96–112)
Creatinine, Ser: 0.99 mg/dL (ref 0.40–1.50)
GFR: 79.33 mL/min (ref >60.00)
Glucose, Bld: 118 mg/dL — ABNORMAL HIGH (ref 70–99)
Potassium: 3.4 mEq/L — ABNORMAL LOW (ref 3.5–5.1)
Sodium: 140 mEq/L (ref 135–145)
Total Bilirubin: 0.9 mg/dL (ref 0.2–1.2)
Total Protein: 7.3 g/dL (ref 6.0–8.3)

## 2023-01-04 LAB — CBC
HCT: 46.7 % (ref 39.0–52.0)
Hemoglobin: 15.6 g/dL (ref 13.0–17.0)
MCHC: 33.4 g/dL (ref 30.0–36.0)
MCV: 86.6 fl (ref 78.0–100.0)
Platelets: 197 10*3/uL (ref 150.0–400.0)
RBC: 5.4 Mil/uL (ref 4.22–5.81)
RDW: 13.9 % (ref 11.5–15.5)
WBC: 7.6 10*3/uL (ref 4.0–10.5)

## 2023-01-04 LAB — HEMOGLOBIN A1C: Hgb A1c MFr Bld: 6.8 % — ABNORMAL HIGH (ref 4.6–6.5)

## 2023-01-04 MED ORDER — AMLODIPINE BESYLATE 5 MG PO TABS
5.0000 mg | ORAL_TABLET | Freq: Every day | ORAL | 1 refills | Status: DC
Start: 2023-01-04 — End: 2023-01-17

## 2023-01-04 MED ORDER — SERTRALINE HCL 100 MG PO TABS: 100.0000 mg | ORAL_TABLET | Freq: Every day | ORAL | 1 refills | Status: DC

## 2023-01-04 MED ORDER — NITROGLYCERIN 0.4 MG SL SUBL: 0.4000 mg | SUBLINGUAL_TABLET | SUBLINGUAL | 1 refills | Status: DC | PRN

## 2023-01-04 NOTE — Progress Notes (Signed)
Marikay Alar, MD Phone: [phone]  James Yates is a 67 y.o. male who presents today for f/u.  Patient was seen with the assistance of a spanish video interpretor.   Cervical radiculopathy: Patient notes his neck pain has resolved.  The numbness and weakness he was having in his arms has resolved.  He notes physical therapy was significantly helpful for this.  Chronic low back pain: His back pain continues to be an issue.  It is in his right low back.  Occasionally radiates to his right leg.  Some numbness in his right leg at times.  He reports some weakness in his legs as well.  No incontinence.  Physical therapy helped minimally for this.  Hypertension: Patient notes his blood pressure over the last couple weeks has been up.  The highest it got was 220 systolically.  He had chest pain at that time that was described as a pressure.  It did not radiate.  He had diaphoresis and shortness of breath.  That occurred a couple weeks ago.  Notes no symptoms at this time.  Last chest pain occurred 5 days ago.  No edema.  He sees cardiology for follow-up on 01/17/2023.  Social History   Tobacco Use  Smoking Status Never  Smokeless Tobacco Never    Current Outpatient Medications on File Prior to Visit  Medication Sig Dispense Refill   Acetaminophen (TYLENOL PO) Take by mouth as needed.     aspirin EC 81 MG tablet Take 1 tablet (81 mg total) by mouth daily. 30 tablet 3   Elastic Bandages & Supports (WRIST SPLINT/COCK-UP/LEFT L) MISC Use nightly 1 each 0   Elastic Bandages & Supports (WRIST SPLINT/COCK-UP/RIGHT L) MISC Use nightly 1 each 0   losartan (COZAAR) 100 MG tablet Take 1 tablet (100 mg total) by mouth daily. 90 tablet 3   metoprolol tartrate (LOPRESSOR) 100 MG tablet Take 1 tablet (100 mg total) by mouth 2 (two) times daily. 180 tablet 1   pantoprazole (PROTONIX) 40 MG tablet Take 1 tablet (40 mg total) by mouth 2 (two) times daily before a meal for 30 days, THEN 1 tablet (40 mg  total) daily before breakfast. 90 tablet 3   predniSONE (DELTASONE) 20 MG tablet Take 2 tablets (40 mg total) by mouth daily with breakfast. 10 tablet 0   rosuvastatin (CRESTOR) 40 MG tablet Take 1 tablet (40 mg total) by mouth daily. 90 tablet 3   terbinafine (LAMISIL) 250 MG tablet Take 1 tablet (250 mg total) by mouth daily. 56 tablet 0   No current facility-administered medications on file prior to visit.     ROS see history of present illness  Objective  Physical Exam Vitals:   01/04/23 1049 01/04/23 1135  BP: (!) 146/84 130/80  Pulse: 68   Temp: 98.4 F (36.9 C)   SpO2: 97%     BP Readings from Last 3 Encounters:  01/04/23 130/80  09/30/22 126/82  09/06/22 (!) 142/88   Wt Readings from Last 3 Encounters:  01/04/23 192 lb 9.6 oz (87.4 kg)  09/30/22 192 lb 12.8 oz (87.5 kg)  09/06/22 186 lb (84.4 kg)    Physical Exam Constitutional:      General: He is not in acute distress.    Appearance: He is not diaphoretic.  Cardiovascular:     Rate and Rhythm: Normal rate and regular rhythm.     Heart sounds: Normal heart sounds.  Pulmonary:     Effort: Pulmonary effort is normal.  Breath sounds: Normal breath sounds.  Musculoskeletal:     Comments: No midline spine tenderness, no midline spine step-off, there is mild muscular tenderness in his lumbar spine  Skin:    General: Skin is warm and dry.  Neurological:     Mental Status: He is alert.     Comments: 5/5 strength in bilateral biceps, triceps, grip, quads, hamstrings, plantar and dorsiflexion, sensation to light touch intact in bilateral UE and LE, normal gait, 2+ patellar reflexes    EKG: Sinus rhythm, abnormal P axis in leads I and II, possibly could be related to incorrect lead placement, right bundle branch block, inverted T wave lead II and lead aVF consistent with prior EKGs  Assessment/Plan: Please see individual problem list.  HTN, goal below 140/90 Assessment & Plan: Chronic issue.  Above goal.   Patient will continue losartan 100 mg daily and metoprolol 100 mg twice daily.  We will add amlodipine 5 mg daily.  Follow-up in 1 month.  He will also see cardiology as planned.  Orders: -     amLODIPine Besylate; Take 1 tablet (5 mg total) by mouth daily.  Dispense: 90 tablet; Refill: 1  Anxiety and depression -     Sertraline HCl; Take 1 tablet (100 mg total) by mouth daily.  Dispense: 90 tablet; Refill: 1  Cervical radiculopathy Assessment & Plan: Chronic issue.  Patient notes his symptoms have resolved.  He will monitor for recurrence.   Chronic bilateral low back pain with bilateral sciatica Assessment & Plan: Chronic issue.  Minimal improvement with PT.  Lumbar MRI ordered today to help determine the cause of his symptoms.  Discussed the neck step in management would depend on what his MRI reveals.  Orders: -     MR LUMBAR SPINE WO CONTRAST; Future  Chest pain, unspecified type Assessment & Plan: Episode when his blood pressure was elevated.  Appears to have had recurrent episodes in the past.  EKG completed today.  Lab work as outlined.  Patient will see cardiology as planned.  Patient was advised to seek medical attention for any persistent or recurrent chest pain.  Nitroglycerin refilled.  Orders: -     EKG 12-Lead -     Comprehensive metabolic panel -     CBC  Elevated glucose -     Hemoglobin A1c  Other orders -     Nitroglycerin; Place 1 tablet (0.4 mg total) under the tongue every 5 (five) minutes as needed for chest pain.  Dispense: 30 tablet; Refill: 1     Return in about 1 month (around 02/04/2023) for Hypertension.   Marikay Alar, MD [facility]

## 2023-01-04 NOTE — Patient Instructions (Signed)
Nice to see you. We are adding amlodipine to help with your blood pressure.  We will see you back in 1 month. Somebody will call you to schedule the MRI of your low back.  If you do not hear from [place] within a week or 2 to get this scheduled please let us know. We will contact you with the lab results. Please make sure you see cardiology as planned. If you develop persistent chest pain or chest pain that is recurrent please seek medical attention immediately.

## 2023-01-04 NOTE — Assessment & Plan Note (Signed)
Chronic issue.  Patient notes his symptoms have resolved.  He will monitor for recurrence.

## 2023-01-04 NOTE — Assessment & Plan Note (Addendum)
Episode when his blood pressure was elevated.  Appears to have had recurrent episodes in the past.  EKG completed today.  Lab work as outlined.  Patient will see cardiology as planned.  Patient was advised to seek medical attention for any persistent or recurrent chest pain.  Nitroglycerin refilled.

## 2023-01-04 NOTE — Assessment & Plan Note (Signed)
Chronic issue.  Above goal.  Patient will continue losartan 100 mg daily and metoprolol 100 mg twice daily.  We will add amlodipine 5 mg daily.  Follow-up in 1 month.  He will also see cardiology as planned.

## 2023-01-04 NOTE — Assessment & Plan Note (Signed)
Chronic issue.  Minimal improvement with PT.  Lumbar MRI ordered today to help determine the cause of his symptoms.  Discussed the neck step in management would depend on what his MRI reveals.

## 2023-01-05 ENCOUNTER — Telehealth: Payer: Self-pay | Admitting: Family Medicine

## 2023-01-05 NOTE — Telephone Encounter (Signed)
lft pt vm to call ofc to sch MRI. thanks

## 2023-01-05 NOTE — Telephone Encounter (Signed)
Patient returned phone call. °

## 2023-01-06 ENCOUNTER — Telehealth: Payer: Self-pay

## 2023-01-06 NOTE — Telephone Encounter (Signed)
-----   Message from [facility] Elijio Miles sent at 01/04/2023  5:14 PM EDT ----- James Yates was informed of lab results via interpreter Casandra ID# [phone]. James Yates did not state whether he would like to start the metformin or not he just said thank you for the information.

## 2023-01-06 NOTE — Telephone Encounter (Signed)
Interpreter Luciana ID# [phone] attempted to call pt. No answer and mail box was full. When pt returns call need to find out if he wants to start taking the Metformin.

## 2023-01-11 ENCOUNTER — Ambulatory Visit
Admission: RE | Admit: 2023-01-11 | Discharge: 2023-01-11 | Disposition: A | Discharge status: Home / Self Care | Payer: Medicare Other | Source: Ambulatory Visit | Attending: Family Medicine | Admitting: Family Medicine

## 2023-01-11 DIAGNOSIS — M5441 Lumbago with sciatica, right side: Secondary | ICD-10-CM | POA: Diagnosis present

## 2023-01-11 DIAGNOSIS — G8929 Other chronic pain: Secondary | ICD-10-CM | POA: Insufficient documentation

## 2023-01-11 DIAGNOSIS — M5442 Lumbago with sciatica, left side: Secondary | ICD-10-CM | POA: Insufficient documentation

## 2023-01-16 NOTE — Progress Notes (Unsigned)
Cardiology Office Note    Date:  01/17/2023   ID:  [name], DOB 1955-11-08, MRN [id number]  PCP:  Glori Luis, MD  Cardiologist:  Lorine Bears, MD  Electrophysiologist:  None   Chief Complaint: Follow up  History of Present Illness:   James Yates is a 67 y.o. male with history of CAD s/p CABG in 06/2013 following a NSTEMI, bicuspid aortic valve with severe stenosis s/p TAVR in 02/2018, paroxysmal SVT, HTN, HLD, and RBBB who presents for follow up of CAD.   LHC in 10/2015 showed severe underlying 3-vessel CAD with a patent LIMA-LAD, and SVG-RCA. The SVG-LCx was occluded. The native LCx was occluded proximally with faint collaterals. There was moderate aortic stenosis with a peak to peak gradient of 22 mmHg. LHC was reviewed with our CTO team with the opinion being the LCx was not optimal for PCI. Echo in 09/2017 showed an EF of 60-65%, normal wall motion, progression of his aortic valve stenosis to severe with a peak velocity of 422 cm/s, mean gradient of 38 mmHg, and apeak gradient of 71 mmHg, left atrium was normal in size, RVSF normal, PASP 46 mmHg. He was seen on 11/23/2017 with worsening exertional chest tightness. He underwent R/LHC on 11/27/2017 that showed significant underlying 3-vessel CAD with a patent LIMA to LAD (could not be selectively engaged), patent SVG to RCA and known occluded SVG to OM3 along with an occluded LCx. There was moderate to severe aortic stenosis with a mean gradient of 26 mmHg and a valve area of 1.2. However, his aortic stenosis was felt to be likely severe based on echo findings and his valve area noted on 11/27/2017 was felt to be overestimated due to high cardiac output. Valve area of echo noted to be 0.6. The aortic valve morphology was also consistent with severe aortic stenosis. RHC showed mildly elevated filling pressures with mild pulmonary hypertension. He subsequently underwent TAVR in 02/2018. Echo from 02/2019 showed an EF of 60-65%, normal  RVSF and ventricular cavity size, normal PASP, and a normal functioning bioprosthetic aortic valve with a mean gradient of 10 mmHg.  He underwent Zio in 2021 patch monitoring which showed a predominant rhythm of sinus with an average heart rate of 89 bpm, IVCD was noted, 10 episodes of SVT with the longest episode lasting 16 beats with an average heart rate of 152 bpm, rare PACs and PVCs.  Some patient triggered events correlated with PVCs and sinus tachycardia.  Echo in 03/2020 showed an EF of 50 to 55%, no regional wall motion abnormalities, normal LV diastolic function parameters, normal RV systolic function and ventricular cavity size, and normal structure and function of aortic valve prosthesis.     He was seen in the office in 05/2021 and had lost his health insurance briefly.  In this setting, he had ran out of medications.  Given this, he was not taking his antihypertensive medications and reported worsening chest pain and shortness of breath.  Symptoms were felt to be in the setting of being off antianginal medications.  At time of his visit, he was back under health insurance.   He was seen on 02/02/2022 continuing to note some exertional chest discomfort that was consistent what he was feeling at his visit in early 2023.  He also continued to note unchanged and longstanding palpitations.  Lexiscan MPI on 02/10/2022 was overall low risk with no significant ischemia with a small region of fixed defect in the apical, distal anterior apical, and  inferior apical region and an EF of 46%.  CT attenuated corrected images showed a mild aortic atherosclerosis, moderate coronary artery calcification, and TAVR valve.   In follow up on 04/11/2022, he noted stable chest discomfort.  Echo on 04/18/2022 demonstrated an EF of 55 to 60%, no regional wall motion abnormalities, indeterminate LV diastolic function parameters, low normal RV systolic function with normal ventricular cavity size, normal structure and function  of aortic valve prosthesis, and an estimated right atrial pressure of 3 mmHg.  He was seen in 04/2022 with shoulder pain, headache, and intermittent bleeding from the gums.  Amlodipine was discontinued to see if this would help with his gingival bleeding.  He was prescribed ranolazine, though could not afford the medication.  He was started on Imdur, though this worsened his headache.  He was last seen in the office in 07/2022, and he continued to report stable exertional chest pain and shortness of breath with overexertion.  Symptoms were felt to be likely due to occluded LCx and graft to OM 3.  Imdur was discontinued due to headache.  Losartan was titrated to 100 mg daily with continuation of metoprolol.  Rosuvastatin was titrated to 40 mg daily.  He was evaluated at his PCPs office earlier this month with reported blood pressures greater than 200 systolic with associated chest pressure.  Blood pressure at his visit with PCP was well-controlled at 130/80.  He was started on amlodipine 5 mg daily.  He comes in today and is currently without symptoms of angina or cardiac decompensation.  He has not noted any improvement of his headache following the discontinuation of Imdur.  He has tolerated the addition of amlodipine without issues.  He does feel like his chest discomfort is improved when compared to prior, though still does have some exertional shortness of breath and chest pain.  No falls or symptoms concerning for bleeding.  He has tolerated the titration of rosuvastatin.  No dizziness, presyncope, or syncope.   Labs independently reviewed: 12/2022 - A1c 6.8, Hgb 15.6, PLT 197, potassium 3.4, BUN 14, serum creatinine 0.99, albumin 4.6, AST/ALT normal 03/2022 - TC 223, TG 274, HDL 42, LDL 126  Past Medical History:  Diagnosis Date   Coronary artery disease    s/p CABG   Glucose intolerance (impaired glucose tolerance)    Headache    chronic, no red flag or atypical signs or symptoms    Hyperlipidemia    Hypertension    Obesity    S/P TAVR (transcatheter aortic valve replacement)    26 mm Edwards Sapien 3 transcatheter heart valve placed via percutaneous right transfemoral approach    Severe aortic stenosis     Past Surgical History:  Procedure Laterality Date   CARDIAC CATHETERIZATION  06/2013   [facility]   CARDIAC CATHETERIZATION N/A 10/29/2015   Procedure: Left Heart Cath and Cors/Grafts Angiography;  Surgeon: Iran Ouch, MD;  Location: [facility] INVASIVE CV LAB;  Service: Cardiovascular;  Laterality: N/A;   CORONARY ARTERY BYPASS GRAFT N/A 07/05/2013   Procedure: CORONARY ARTERY BYPASS GRAFTING (CABG);  Surgeon: [name], MD;  Location: [facility] OR;  Service: Open Heart Surgery;  Laterality: N/A;   INTRAOPERATIVE TRANSESOPHAGEAL ECHOCARDIOGRAM N/A 07/05/2013   Procedure: INTRAOPERATIVE TRANSESOPHAGEAL ECHOCARDIOGRAM;  Surgeon: [name], MD;  Location: [facility] OR;  Service: Open Heart Surgery;  Laterality: N/A;   INTRAOPERATIVE TRANSTHORACIC ECHOCARDIOGRAM N/A 03/13/2018   Procedure: INTRAOPERATIVE TRANSTHORACIC ECHOCARDIOGRAM;  Surgeon: Kathleene Hazel, MD;  Location: [facility] OR;  Service: Open  Heart Surgery;  Laterality: N/A;   NO PAST SURGERIES     RIGHT/LEFT HEART CATH AND CORONARY ANGIOGRAPHY Bilateral 11/27/2017   Procedure: RIGHT/LEFT HEART CATH AND CORONARY ANGIOGRAPHY;  Surgeon: Iran Ouch, MD;  Location: [facility] INVASIVE CV LAB;  Service: Cardiovascular;  Laterality: Bilateral;   TRANSCATHETER AORTIC VALVE REPLACEMENT, TRANSFEMORAL N/A 03/13/2018   Procedure: TRANSCATHETER AORTIC VALVE REPLACEMENT, TRANSFEMORAL. Edwards Sapien 3 Transcatheter Heart Valve size 26mm.;  Surgeon: Kathleene Hazel, MD;  Location: [facility] OR;  Service: Open Heart Surgery;  Laterality: N/A;    Current Medications: Current Meds  Medication Sig   Acetaminophen (TYLENOL PO) Take by mouth as needed.   aspirin EC 81 MG tablet Take 1 tablet (81 mg total) by mouth daily.   Elastic  Bandages & Supports (WRIST SPLINT/COCK-UP/LEFT L) MISC Use nightly   Elastic Bandages & Supports (WRIST SPLINT/COCK-UP/RIGHT L) MISC Use nightly   isosorbide mononitrate (IMDUR) 30 MG 24 hr tablet Take 30 mg by mouth daily.   losartan (COZAAR) 100 MG tablet Take 1 tablet (100 mg total) by mouth daily.   metoprolol tartrate (LOPRESSOR) 100 MG tablet Take 1 tablet (100 mg total) by mouth 2 (two) times daily.   nitroGLYCERIN (NITROSTAT) 0.4 MG SL tablet Place 1 tablet (0.4 mg total) under the tongue every 5 (five) minutes as needed for chest pain.   pantoprazole (PROTONIX) 40 MG tablet Take 1 tablet (40 mg total) by mouth 2 (two) times daily before a meal for 30 days, THEN 1 tablet (40 mg total) daily before breakfast.   rosuvastatin (CRESTOR) 40 MG tablet Take 1 tablet (40 mg total) by mouth daily.   sertraline (ZOLOFT) 100 MG tablet Take 1 tablet (100 mg total) by mouth daily.   terbinafine (LAMISIL) 250 MG tablet Take 1 tablet (250 mg total) by mouth daily.   [DISCONTINUED] amLODipine (NORVASC) 5 MG tablet Take 1 tablet (5 mg total) by mouth daily.    Allergies:   Patient has no known allergies.   Social History   Socioeconomic History   Marital status: Married    Spouse name: Not on file   Number of children: Not on file   Years of education: Not on file   Highest education level: Not on file  Occupational History   Not on file  Tobacco Use   Smoking status: Never   Smokeless tobacco: Never  Vaping Use   Vaping status: Never Used  Substance and Sexual Activity   Alcohol use: No   Drug use: No   Sexual activity: Not on file  Other Topics Concern   Not on file  Social History Narrative   Not on file   Social Determinants of Health   Financial Resource Strain: Not on file  Food Insecurity: Not on file  Transportation Needs: Not on file  Physical Activity: Not on file  Stress: Not on file  Social Connections: Not on file     Family History:  The patient's family history  includes Heart attack in his father; Heart disease in his father.  ROS:   12-point review of systems is negative unless otherwise noted in the HPI.   EKGs/Labs/Other Studies Reviewed:    Studies reviewed were summarized above. The additional studies were reviewed today:  2D echo 04/18/2022: 1. Left ventricular ejection fraction, by estimation, is 55 to 60%. The  left ventricle has normal function. The left ventricle has no regional  wall motion abnormalities. Left ventricular diastolic parameters are  indeterminate. The average left  ventricular global longitudinal strain is -22.8 %. The global longitudinal  strain is normal.   2. Right ventricular systolic function is low normal. The right  ventricular size is normal.   3. The mitral valve is normal in structure. No evidence of mitral valve  regurgitation.   4. The aortic valve has been repaired/replaced. Aortic valve  regurgitation is not visualized. Echo findings are consistent with normal  structure and function of the aortic valve prosthesis. Aortic valve mean  gradient measures 11.0 mmHg.   5. The inferior vena cava is normal in size with greater than 50%  respiratory variability, suggesting right atrial pressure of 3 mmHg.   Comparison(s): Echocardiogram done 04/16/20 showed an EF of 50-55%.  __________   Eugenie Birks MPI 02/10/2022: Pharmacological myocardial perfusion imaging study with no significant  ischemia Small region fixed defect in the apical, distal anterior apical, inferior apical region, unable to exclude small region prior MI Normal wall motion, EF estimated at 46% No EKG changes concerning for ischemia at peak stress or in recovery. CT attenuation correction images with mild aortic atherosclerosis and moderate coronary calcification, TAVR valve noted Low risk scan __________   Zio patch 03/2020: Normal sinus rhythm with an average heart rate 89 bpm.  IVCD. 10 episodes of supraventricular tachycardia.  The  longest lasted 16 beats with an average heart rate of 152 bpm. Rare PACs and rare PVCs. Some triggered events correlated with PVCs and sinus tachycardia. __________   2D echo 03/2020: 1. Left ventricular ejection fraction, by estimation, is 50 to 55%. Left  ventricular ejection fraction by 3D volume is 53 %. The left ventricle has  low normal function. The left ventricle has no regional wall motion  abnormalities. Left ventricular  diastolic parameters were normal.   2. Right ventricular systolic function is normal. The right ventricular  size is normal.   3. The mitral valve is normal in structure. No evidence of mitral valve  regurgitation.   4. The aortic valve has been repaired/replaced. Aortic valve  regurgitation is not visualized. Echo findings are consistent with normal  structure and function of the aortic valve prosthesis. ___________   See Epic for remaining cardiac studies   EKG:  EKG is ordered today.  The EKG ordered today demonstrates NSR, 99 bpm, RBBB, prior inferior infarct, consistent with prior tracing  Recent Labs: 01/04/2023: ALT 20; BUN 14; Creatinine, Ser 0.99; Hemoglobin 15.6; Platelets 197.0; Potassium 3.4; Sodium 140  Recent Lipid Panel    Component Value Date/Time   CHOL 223 (H) 04/11/2022 1057   CHOL 193 03/31/2020 1019   CHOL 215 (H) 07/02/2013 0058   TRIG 274 (H) 04/11/2022 1057   TRIG 143 07/02/2013 0058   HDL 42 04/11/2022 1057   HDL 48 03/31/2020 1019   HDL 38 (L) 07/02/2013 0058   CHOLHDL 5.3 04/11/2022 1057   VLDL 55 (H) 04/11/2022 1057   VLDL 29 07/02/2013 0058   LDLCALC 126 (H) 04/11/2022 1057   LDLCALC 117 (H) 03/31/2020 1019   LDLCALC 148 (H) 07/02/2013 0058    PHYSICAL EXAM:    VS:  BP (!) 140/88   Pulse 93   Ht 5\' 5"  (1.651 m)   Wt 189 lb 9.6 oz (86 kg)   SpO2 93%   BMI 31.55 kg/m   BMI: Body mass index is 31.55 kg/m.  Physical Exam Vitals reviewed.  Constitutional:      Appearance: He is well-developed.  HENT:      Head: Normocephalic and atraumatic.  Eyes:     General:        Right eye: No discharge.        Left eye: No discharge.  Neck:     Vascular: No JVD.  Cardiovascular:     Rate and Rhythm: Normal rate and regular rhythm.     Heart sounds: S1 normal and S2 normal. Heart sounds not distant. No midsystolic click and no opening snap. Murmur heard.     Systolic murmur is present with a grade of 2/6 at the upper right sternal border.     No friction rub.  Pulmonary:     Effort: Pulmonary effort is normal. No respiratory distress.     Breath sounds: Normal breath sounds. No decreased breath sounds, wheezing or rales.  Chest:     Chest wall: No tenderness.  Abdominal:     General: There is no distension.  Musculoskeletal:     Cervical back: Normal range of motion.     Right lower leg: No edema.     Left lower leg: No edema.  Skin:    General: Skin is warm and dry.     Nails: There is no clubbing.  Neurological:     Mental Status: He is alert and oriented to person, place, and time.  Psychiatric:        Speech: Speech normal.        Behavior: Behavior normal.        Thought Content: Thought content normal.        Judgment: Judgment normal.     Wt Readings from Last 3 Encounters:  01/17/23 189 lb 9.6 oz (86 kg)  01/04/23 192 lb 9.6 oz (87.4 kg)  09/30/22 192 lb 12.8 oz (87.5 kg)     ASSESSMENT & PLAN:   CAD status post CABG with stable angina: Overall, symptoms of angina or stable to slightly improved.  Titrate amlodipine to 10 mg daily with continuation of Lopressor 100 mg twice daily.  Symptoms of class II angina are felt to be due to occluded LCx and graft to OM 3.  Currently on maximally tolerated antianginal therapy following titration of amlodipine today.  Unable to afford ranolazine.  Intolerant to Imdur secondary to headache.  If he continues to have symptoms concerning for angina despite controlling blood pressure and on maximally tolerated antianginal therapy, repeat LHC will  need to be considered.  Severe aortic stenosis status post TAVR: Most recent echo showed normal LV systolic function with normal functioning TAVR prosthesis.  Continue SBE prophylaxis before dental procedures.  Remains on aspirin.  HTN: Blood pressure remains mildly elevated in the office today, though improved from prior visit.  Titrate amlodipine to 10 mg daily with continuation of losartan 100 mg daily and Lopressor 100 mg twice daily.  Lowest DM diet recommended.  HLD: LDL 126 in 03/2022 with target LDL being at least less than 70.  Now on rosuvastatin 40 mg daily and tolerating.  Look to follow-up fasting lipid panel and LFT at next visit.  Language barrier: Office visit facilitated with the assistance of [facility] medical interpreter.    Disposition: F/u with Dr. Kirke Corin or an APP in 2 months.   Medication Adjustments/Labs and Tests Ordered: Current medicines are reviewed at length with the patient today.  Concerns regarding medicines are outlined above. Medication changes, Labs and Tests ordered today are summarized above and listed in the Patient Instructions accessible in Encounters.   Signed, Eula Listen, PA-C 01/17/2023 5:18 PM  [facility] [address] [place], [place] [address] [phone]

## 2023-01-17 ENCOUNTER — Encounter: Payer: Self-pay | Admitting: Physician Assistant

## 2023-01-17 ENCOUNTER — Ambulatory Visit: Payer: Medicare Other | Attending: Cardiovascular Disease | Admitting: Physician Assistant

## 2023-01-17 VITALS — BP 140/88 | HR 93 | Ht 65.0 in | Wt 189.6 lb

## 2023-01-17 DIAGNOSIS — Z952 Presence of prosthetic heart valve: Secondary | ICD-10-CM | POA: Diagnosis not present

## 2023-01-17 DIAGNOSIS — E785 Hyperlipidemia, unspecified: Secondary | ICD-10-CM | POA: Insufficient documentation

## 2023-01-17 DIAGNOSIS — I25118 Atherosclerotic heart disease of native coronary artery with other forms of angina pectoris: Secondary | ICD-10-CM | POA: Diagnosis not present

## 2023-01-17 DIAGNOSIS — I10 Essential (primary) hypertension: Secondary | ICD-10-CM | POA: Diagnosis present

## 2023-01-17 DIAGNOSIS — I350 Nonrheumatic aortic (valve) stenosis: Secondary | ICD-10-CM | POA: Insufficient documentation

## 2023-01-17 DIAGNOSIS — Z951 Presence of aortocoronary bypass graft: Secondary | ICD-10-CM | POA: Diagnosis not present

## 2023-01-17 DIAGNOSIS — Z603 Acculturation difficulty: Secondary | ICD-10-CM | POA: Insufficient documentation

## 2023-01-17 DIAGNOSIS — Z758 Other problems related to medical facilities and other health care: Secondary | ICD-10-CM | POA: Insufficient documentation

## 2023-01-17 MED ORDER — AMLODIPINE BESYLATE 10 MG PO TABS: 10.0000 mg | ORAL_TABLET | Freq: Every day | ORAL | 0 refills | Status: DC

## 2023-01-17 MED ORDER — AMLODIPINE BESYLATE 10 MG PO TABS
10.0000 mg | ORAL_TABLET | Freq: Every day | ORAL | 0 refills | Status: DC
Start: 2023-01-17 — End: 2023-05-08

## 2023-01-17 NOTE — Patient Instructions (Signed)
Medication Instructions:  INCREASE the Amlodipine to 10 mg once daily  *If you need a refill on your cardiac medications before your next appointment, please call your pharmacy*   Lab Work: None ordered If you have labs (blood work) drawn today and your tests are completely normal, you will receive your results only by: MyChart Message (if you have MyChart) OR A paper copy in the mail If you have any lab test that is abnormal or we need to change your treatment, we will call you to review the results.   Testing/Procedures: None ordered   Follow-Up: At [facility], you and your health needs are our priority.  As part of our continuing mission to provide you with exceptional heart care, we have created designated Provider Care Teams.  These Care Teams include your primary Cardiologist (physician) and Advanced Practice Providers (APPs -  Physician Assistants and Nurse Practitioners) who all work together to provide you with the care you need, when you need it.  We recommend signing up for the patient portal called "MyChart".  Sign up information is provided on this After Visit Summary.  MyChart is used to connect with patients for Virtual Visits (Telemedicine).  Patients are able to view lab/test results, encounter notes, upcoming appointments, etc.  Non-urgent messages can be sent to your provider as well.   To learn more about what you can do with MyChart, go to [email or web address].    Your next appointment:   2 month(s)  Provider:   You may see Lorine Bears, MD or one of the following Advanced Practice Providers on your designated Care Team:   Eula Listen, [place]

## 2023-01-18 ENCOUNTER — Other Ambulatory Visit: Payer: Self-pay | Admitting: Family Medicine

## 2023-01-18 DIAGNOSIS — K219 Gastro-esophageal reflux disease without esophagitis: Secondary | ICD-10-CM

## 2023-01-23 ENCOUNTER — Other Ambulatory Visit: Payer: Self-pay

## 2023-01-23 DIAGNOSIS — R079 Chest pain, unspecified: Secondary | ICD-10-CM | POA: Insufficient documentation

## 2023-01-23 DIAGNOSIS — R42 Dizziness and giddiness: Secondary | ICD-10-CM | POA: Diagnosis not present

## 2023-01-23 DIAGNOSIS — R519 Headache, unspecified: Secondary | ICD-10-CM | POA: Insufficient documentation

## 2023-01-23 DIAGNOSIS — I10 Essential (primary) hypertension: Secondary | ICD-10-CM | POA: Insufficient documentation

## 2023-01-23 NOTE — ED Triage Notes (Signed)
Patient ambulatory to triage with steady gait, without difficulty or distress noted; pt reports mid upper CP that radiates into left side accomp by nausea generalized HA this evening; st hx of same and was evaluated 3wks and was dx with HTN

## 2023-01-24 ENCOUNTER — Emergency Department
Admission: EM | Admit: 2023-01-24 | Discharge: 2023-01-24 | Disposition: A | Discharge status: Home / Self Care | Payer: Medicare Other | Attending: Emergency Medicine | Admitting: Emergency Medicine

## 2023-01-24 ENCOUNTER — Emergency Department: Payer: Medicare Other

## 2023-01-24 ENCOUNTER — Encounter: Payer: Self-pay | Admitting: Emergency Medicine

## 2023-01-24 DIAGNOSIS — R519 Headache, unspecified: Secondary | ICD-10-CM

## 2023-01-24 DIAGNOSIS — R079 Chest pain, unspecified: Secondary | ICD-10-CM

## 2023-01-24 DIAGNOSIS — R42 Dizziness and giddiness: Secondary | ICD-10-CM

## 2023-01-24 LAB — CBC
HCT: 44.8 % (ref 39.0–52.0)
Hemoglobin: 15 g/dL (ref 13.0–17.0)
MCH: 28.4 pg (ref 26.0–34.0)
MCHC: 33.5 g/dL (ref 30.0–36.0)
MCV: 84.7 fL (ref 80.0–100.0)
Platelets: 154 10*3/uL (ref 150–400)
RBC: 5.29 MIL/uL (ref 4.22–5.81)
RDW: 12.8 % (ref 11.5–15.5)
WBC: 6.5 10*3/uL (ref 4.0–10.5)
nRBC: 0 % (ref 0.0–0.2)

## 2023-01-24 LAB — HEPATIC FUNCTION PANEL
ALT: 19 U/L (ref 0–44)
AST: 21 U/L (ref 15–41)
Albumin: 4.6 g/dL (ref 3.5–5.0)
Alkaline Phosphatase: 85 U/L (ref 38–126)
Bilirubin, Direct: 0.2 mg/dL (ref 0.0–0.2)
Indirect Bilirubin: 1 mg/dL — ABNORMAL HIGH (ref 0.3–0.9)
Total Bilirubin: 1.2 mg/dL (ref 0.3–1.2)
Total Protein: 8.3 g/dL — ABNORMAL HIGH (ref 6.5–8.1)

## 2023-01-24 LAB — TROPONIN I (HIGH SENSITIVITY)
Troponin I (High Sensitivity): 26 ng/L — ABNORMAL HIGH (ref <18)
Troponin I (High Sensitivity): 29 ng/L — ABNORMAL HIGH (ref <18)

## 2023-01-24 LAB — BASIC METABOLIC PANEL
Anion gap: 12 (ref 5–15)
BUN: 15 mg/dL (ref 8–23)
CO2: 26 mmol/L (ref 22–32)
Calcium: 8.6 mg/dL — ABNORMAL LOW (ref 8.9–10.3)
Chloride: 100 mmol/L (ref 98–111)
Creatinine, Ser: 0.8 mg/dL (ref 0.61–1.24)
GFR, Estimated: >60 mL/min (ref >60)
Glucose, Bld: 150 mg/dL — ABNORMAL HIGH (ref 70–99)
Potassium: 2.8 mmol/L — ABNORMAL LOW (ref 3.5–5.1)
Sodium: 138 mmol/L (ref 135–145)

## 2023-01-24 LAB — LIPASE, BLOOD: Lipase: 45 U/L (ref 11–51)

## 2023-01-24 LAB — MAGNESIUM: Magnesium: 2.2 mg/dL (ref 1.7–2.4)

## 2023-01-24 MED ORDER — DIPHENHYDRAMINE HCL 50 MG/ML IJ SOLN
12.5000 mg | Freq: Once | INTRAMUSCULAR | Status: AC
  Administered 2023-01-24: 12.5 mg via INTRAVENOUS
  Filled 2023-01-24: qty 1

## 2023-01-24 MED ORDER — IOHEXOL 350 MG/ML SOLN
100.0000 mL | Freq: Once | INTRAVENOUS | Status: AC | PRN
  Administered 2023-01-24: 100 mL via INTRAVENOUS

## 2023-01-24 MED ORDER — SODIUM CHLORIDE 0.9 % IV BOLUS
1000.0000 mL | Freq: Once | INTRAVENOUS | Status: AC
  Administered 2023-01-24: 1000 mL via INTRAVENOUS

## 2023-01-24 MED ORDER — MECLIZINE HCL 25 MG PO TABS
25.0000 mg | ORAL_TABLET | Freq: Once | ORAL | Status: AC
  Administered 2023-01-24: 25 mg via ORAL
  Filled 2023-01-24: qty 1

## 2023-01-24 MED ORDER — ASPIRIN 81 MG PO CHEW
324.0000 mg | CHEWABLE_TABLET | Freq: Once | ORAL | Status: AC
  Administered 2023-01-24: 324 mg via ORAL
  Filled 2023-01-24: qty 4

## 2023-01-24 MED ORDER — POTASSIUM CHLORIDE CRYS ER 20 MEQ PO TBCR
40.0000 meq | EXTENDED_RELEASE_TABLET | Freq: Once | ORAL | Status: AC
  Administered 2023-01-24: 40 meq via ORAL
  Filled 2023-01-24: qty 2

## 2023-01-24 MED ORDER — ACETAMINOPHEN 500 MG PO TABS
1000.0000 mg | ORAL_TABLET | Freq: Once | ORAL | Status: AC
  Administered 2023-01-24: 1000 mg via ORAL
  Filled 2023-01-24: qty 2

## 2023-01-24 MED ORDER — METOCLOPRAMIDE HCL 5 MG/ML IJ SOLN
10.0000 mg | Freq: Once | INTRAMUSCULAR | Status: AC
  Administered 2023-01-24: 10 mg via INTRAVENOUS
  Filled 2023-01-24: qty 2

## 2023-01-24 MED ORDER — MECLIZINE HCL 25 MG PO TABS: 25.0000 mg | ORAL_TABLET | Freq: Three times a day (TID) | ORAL | 0 refills | Status: DC | PRN

## 2023-01-24 NOTE — Discharge Instructions (Signed)
Fortunately your testing in the emergency department did not show any emergency conditions to account for your chest pain or dizziness, like a blood clot in your lung, heart attack, bleeding in your brain, or stroke.  Make sure you drink plenty of fluids to stay well-hydrated.  Take meclizine for dizziness as prescribed.  Thank you for choosing [place] for your health care today!  Please see your primary doctor this week for a follow up appointment.   If you have any new, worsening, or unexpected symptoms call your doctor right away or come back to the emergency department for reevaluation.  It was my pleasure to care for you today.   Daneil Dan Modesto Charon, MD  -- Afortunadamente, sus pruebas en el departamento de emergencias no mostraron ninguna condicin de emergencia que pudiera explicar su dolor en el pecho o mareos, como un cogulo de sangre en el pulmn, un ataque cardaco, una hemorragia cerebral o un derrame cerebral.  Asegrese de beber muchos lquidos para mantenerse bien hidratado.  Tome meclizina para los mareos segn lo recetado.  Gracias por elegirnos para el cuidado de su salud hoy!  Consulte a su mdico de atencin primaria esta semana para una cita de seguimiento.   Si tiene algn sntoma nuevo, que empeora o inesperado, llame a su mdico de inmediato o regrese al departamento de emergencias para una reevaluacin.  Fue un placer cuidar de usted hoy.   Daneil Dan Modesto Charon, MD

## 2023-01-24 NOTE — ED Provider Notes (Signed)
[facility] Provider Note    Event Date/Time   First MD Initiated Contact with Patient 01/24/23 0149     (approximate)   History   Chest Pain  HPI  James Yates is a 67 y.o. male   Past medical history of hypertension, aortic stenosis status post TAVR, hyperlipidemia, chronic headaches who presents to the emergency department with multiple complaints.  First, approximately 4 PM today developed a mild headache and dizziness, imbalance.  Then this evening while at rest he developed chest pressure lasting several hours relieved with nitroglycerin.  Currently he feels well except for mild headache and ongoing dizziness.  He also has some mild nausea.  He denies any respiratory symptoms like shortness of breath, recent illnesses like cough, fever, GI or GU complaints.  He has been fully compliant with all of his medications including antihypertensive.  He denies any leg swelling or pain.  History was obtained via [place] interpreter.  Independent Historian contributed to assessment above: His daughter is at bedside corroborates past medical history and history of present illness as above  External Medical Documents Reviewed: Cardiology note from 01/17/2023 documenting past medical history, cardiology history, medications.      Physical Exam   Triage Vital Signs: ED Triage Vitals  Encounter Vitals Group     BP 01/24/23 0001 (!) 157/103     Systolic BP Percentile --      Diastolic BP Percentile --      Pulse Rate 01/24/23 0001 (!) 106     Resp 01/24/23 0001 18     Temp 01/24/23 0001 97.9 F (36.6 C)     Temp Source 01/24/23 0001 Oral     SpO2 01/24/23 0001 99 %     Weight 01/23/23 2359 192 lb (87.1 kg)     Height 01/23/23 2359 5\' 5"  (1.651 m)     Head Circumference --      Peak Flow --      Pain Score 01/23/23 2359 5     Pain Loc --      Pain Education --      Exclude from Growth Chart --     Most recent vital signs: Vitals:   01/24/23  0255 01/24/23 0405  BP: (!) 148/96 124/87  Pulse: (!) 104 88  Resp: 20 15  Temp:    SpO2: 96% 96%    General: Awake, no distress.  CV:  Good peripheral perfusion.  Resp:  Normal effort.  Abd:  No distention.  Other:  He is tachycardic in the low 100s.  No hypoxemia no fever.  He is hypertensive 150s over 90s.  His lungs are clear.  His abdomen is soft and nontender and he appears euvolemic.  He has equal and intact radial pulses bilaterally.  Extraocular movements are intact, no nystagmus, normal finger-nose, normal motor or sensory exam to bilateral upper and lower extremities.  However upon standing he is ataxic, cannot walk given dizziness.     ED Results / Procedures / Treatments   Labs (all labs ordered are listed, but only abnormal results are displayed) Labs Reviewed  BASIC METABOLIC PANEL - Abnormal; Notable for the following components:      Result Value   Potassium 2.8 (*)    Glucose, Bld 150 (*)    Calcium 8.6 (*)    All other components within normal limits  HEPATIC FUNCTION PANEL - Abnormal; Notable for the following components:   Total Protein 8.3 (*)    Indirect Bilirubin  1.0 (*)    All other components within normal limits  TROPONIN I (HIGH SENSITIVITY) - Abnormal; Notable for the following components:   Troponin I (High Sensitivity) 29 (*)    All other components within normal limits  TROPONIN I (HIGH SENSITIVITY) - Abnormal; Notable for the following components:   Troponin I (High Sensitivity) 26 (*)    All other components within normal limits  CBC  LIPASE, BLOOD  MAGNESIUM     I ordered and reviewed the above labs they are notable for his potassium is low at 2.8.  His troponin initially is 29, consistent with baseline.  EKG  ED ECG REPORT I, Pilar Jarvis, the attending physician, personally viewed and interpreted this ECG.   Date: 01/24/2023  EKG Time: 0008  Rate: 113  Rhythm: sinus  Axis: nl  Intervals:rbbb  ST&T Change: no ischemic  changes compared to EKG obtained in office January 17, 2023    RADIOLOGY I independently reviewed and interpreted chest x-ray and I see no obvious focality or pneumothorax. I also reviewed radiologist's formal read.   PROCEDURES:  Critical Care performed: No  Procedures   MEDICATIONS ORDERED IN ED: Medications  potassium chloride SA (KLOR-CON M) CR tablet 40 mEq (40 mEq Oral Given 01/24/23 0207)  sodium chloride 0.9 % bolus 1,000 mL (0 mLs Intravenous Stopped 01/24/23 0357)  meclizine (ANTIVERT) tablet 25 mg (25 mg Oral Given 01/24/23 0219)  iohexol (OMNIPAQUE) 350 MG/ML injection 100 mL (100 mLs Intravenous Contrast Given 01/24/23 0233)  aspirin chewable tablet 324 mg (324 mg Oral Given 01/24/23 0250)  acetaminophen (TYLENOL) tablet 1,000 mg (1,000 mg Oral Given 01/24/23 0250)  metoCLOPramide (REGLAN) injection 10 mg (10 mg Intravenous Given 01/24/23 0250)  diphenhydrAMINE (BENADRYL) injection 12.5 mg (12.5 mg Intravenous Given 01/24/23 0250)     IMPRESSION / MDM / ASSESSMENT AND PLAN / ED COURSE  I reviewed the triage vital signs and the nursing notes.                                Patient's presentation is most consistent with acute presentation with potential threat to life or bodily function.  Differential diagnosis includes, but is not limited to, ACS, PE, dissection, stroke, ICH, dehydration, electrolyte disturbance   The patient is on the cardiac monitor to evaluate for evidence of arrhythmia and/or significant heart rate changes.  MDM:    In terms of his chest pressure, high risk chest pain in a patient with cardiac history will obtain EKG, troponins, also consider PE given his chest pressure and tachycardia, not anticoagulated, check CT angiogram of the chest.  In terms of his dizziness, nausea, risk factors for stroke outside of the thrombolytic window but with ataxia no other focal neurologic symptoms I am concerned for central cause of his vertigo, will obtain a CT  angiogram of the head and neck given his head/neck pain check for dissection as well, and then an MRI of the brain.  Give meclizine and fluids.  -- Fortunately the workup was unremarkable, flat troponins, negative head imaging, and patient remained stable throughout emergency department stay with his tachycardia resolving with fluids.  He had some improvement with fluids and meclizine, some mild residual dizziness, MRI brain and CTA head neck negative for vascular or stroke.  Plan for discharge and close follow-up with PMD       FINAL CLINICAL IMPRESSION(S) / ED DIAGNOSES   Final diagnoses:  Nonspecific chest  pain  Nonintractable headache, unspecified chronicity pattern, unspecified headache type  Orthostatic dizziness     Rx / DC Orders   ED Discharge Orders          Ordered    meclizine (ANTIVERT) 25 MG tablet  3 times daily PRN        01/24/23 0425             Note:  This document was prepared using Dragon voice recognition software and may include unintentional dictation errors.    Pilar Jarvis, MD 01/24/23 [phone]

## 2023-01-26 ENCOUNTER — Telehealth: Payer: Self-pay

## 2023-01-26 NOTE — Transitions of Care (Post Inpatient/ED Visit) (Signed)
Unable to reach pt by phone; no answer and mailbox is full. [name] (DPR signed) no answer. Will try call again.       01/26/2023  Name: James Yates MRN: [id number] DOB: 04-29-1956  Today's TOC FU Call Status: Today's TOC FU Call Status:: Unsuccessful Call (1st Attempt) Unsuccessful Call (1st Attempt) Date: 01/26/23  Attempted to reach the patient regarding the most recent Inpatient/ED visit.  Follow Up Plan: Additional outreach attempts will be made to reach the patient to complete the Transitions of Care (Post Inpatient/ED visit) call.   Signature Lewanda Rife, LPN

## 2023-02-07 ENCOUNTER — Telehealth: Payer: Self-pay | Admitting: Physician Assistant

## 2023-02-07 ENCOUNTER — Ambulatory Visit (INDEPENDENT_AMBULATORY_CARE_PROVIDER_SITE_OTHER): Payer: Medicare Other | Admitting: Family Medicine

## 2023-02-07 ENCOUNTER — Encounter: Payer: Self-pay | Admitting: Family Medicine

## 2023-02-07 VITALS — BP 132/84 | HR 80 | Temp 98.5°F | Ht 65.0 in | Wt 189.8 lb

## 2023-02-07 DIAGNOSIS — G8929 Other chronic pain: Secondary | ICD-10-CM

## 2023-02-07 DIAGNOSIS — R519 Headache, unspecified: Secondary | ICD-10-CM

## 2023-02-07 DIAGNOSIS — M5136 Other intervertebral disc degeneration, lumbar region: Secondary | ICD-10-CM

## 2023-02-07 DIAGNOSIS — I25719 Atherosclerosis of autologous vein coronary artery bypass graft(s) with unspecified angina pectoris: Secondary | ICD-10-CM

## 2023-02-07 DIAGNOSIS — M5441 Lumbago with sciatica, right side: Secondary | ICD-10-CM

## 2023-02-07 DIAGNOSIS — M5442 Lumbago with sciatica, left side: Secondary | ICD-10-CM | POA: Diagnosis not present

## 2023-02-07 DIAGNOSIS — E876 Hypokalemia: Secondary | ICD-10-CM | POA: Diagnosis not present

## 2023-02-07 DIAGNOSIS — Z952 Presence of prosthetic heart valve: Secondary | ICD-10-CM

## 2023-02-07 LAB — BASIC METABOLIC PANEL
BUN: 15 mg/dL (ref 6–23)
CO2: 31 meq/L (ref 19–32)
Calcium: 9.3 mg/dL (ref 8.4–10.5)
Chloride: 101 meq/L (ref 96–112)
Creatinine, Ser: 0.96 mg/dL (ref 0.40–1.50)
GFR: 82.26 mL/min (ref >60.00)
Glucose, Bld: 107 mg/dL — ABNORMAL HIGH (ref 70–99)
Potassium: 3.6 meq/L (ref 3.5–5.1)
Sodium: 140 meq/L (ref 135–145)

## 2023-02-07 MED ORDER — AMOXICILLIN 500 MG PO TABS: 2000.0000 mg | ORAL_TABLET | Freq: Once | ORAL | 1 refills | Status: DC

## 2023-02-07 MED ORDER — AMOXICILLIN 500 MG PO TABS: 2000.0000 mg | ORAL_TABLET | Freq: Once | ORAL | 1 refills | Status: AC

## 2023-02-07 NOTE — Assessment & Plan Note (Signed)
Patient reports he needs a prescription for amoxicillin for dental procedures.  This was sent to his pharmacy.

## 2023-02-07 NOTE — Assessment & Plan Note (Signed)
Chronic issue.  It is concerning the patient is still having exertional chest pain and shortness of breath.  I will reach out to his cardiology team to get their input on the neck step in management.  For now he will continue amlodipine 10 mg daily, aspirin 81 mg daily, metoprolol 100 mg twice daily, and losartan 100 mg daily.

## 2023-02-07 NOTE — Assessment & Plan Note (Signed)
This is a chronic issue.  He has had negative imaging.  Certainly it is possible some of his medications could be contributing or this could be coming from his degenerative changes in his cervical spine.  He will monitor for now.

## 2023-02-07 NOTE — Telephone Encounter (Signed)
Patient currently scheduled for cardiology follow-up on 10/18.  If possible, can we move this up to be seen by myself or Dr. Kirke Corin?  Thanks.

## 2023-02-07 NOTE — Progress Notes (Signed)
Marikay Alar, MD Phone: [phone]  James Yates is a 67 y.o. male who presents today for follow-up.  Patient was seen with the assistance of a Spanish video interpretor.  ID number [id number]  Chronic back pain: Patient continues to have issues with this.  It radiates down his legs bilaterally.  He had MRI which revealed degenerative changes.  Chest pain/headache: Patient went to the emergency room for chest pain that was relieved by nitroglycerin.  He also noted headache at that time.  He was found to have low potassium and was given potassium supplementation.  Workup was negative for specific cause of his symptoms.  He notes last had chest pain 2 days ago and none since then.  He was laying down when this occurred.  He continues to have periodic headaches that occur posteriorly in his head.  Tylenol is helpful although he only took this on Sunday.  He notes his head will hurt if he moves his neck.  Notes this can happen at night and has been going on for quite some time.  He denies any neck pain at this time.  Patient had MRI brain in the ED that did not reveal any acute issues.  He also had CT angio head and neck that did not have any emergent or hemodynamically significant vessel issues of the head or neck.  He did have mild carotid atherosclerosis though this was not hemodynamically significant.  CT angio chest was completed that did not reveal a cause for his chest pain.  Patient saw cardiology in August and they noted catheterization may be necessary if he continues to have exertional chest pain and shortness of breath.  He does note continued exertional chest pain and shortness of breath.  Social History   Tobacco Use  Smoking Status Never  Smokeless Tobacco Never    Current Outpatient Medications on File Prior to Visit  Medication Sig Dispense Refill   amLODipine (NORVASC) 10 MG tablet Take 1 tablet (10 mg total) by mouth daily. 90 tablet 0   aspirin EC 81 MG tablet Take 1  tablet (81 mg total) by mouth daily. 30 tablet 3   losartan (COZAAR) 100 MG tablet Take 1 tablet (100 mg total) by mouth daily. 90 tablet 3   meclizine (ANTIVERT) 25 MG tablet Take 1 tablet (25 mg total) by mouth 3 (three) times daily as needed for dizziness. 30 tablet 0   nitroGLYCERIN (NITROSTAT) 0.4 MG SL tablet Place 1 tablet (0.4 mg total) under the tongue every 5 (five) minutes as needed for chest pain. 30 tablet 1   pantoprazole (PROTONIX) 40 MG tablet TAKE 1 TABLET BY MOUTH TWO TIMES DAILY BEFORE A MEAL FOR 30 DAYS, THEN 1 TABLET BY MOUTH DAILY BEFORE BREAKFAST 90 tablet 1   rosuvastatin (CRESTOR) 40 MG tablet Take 1 tablet (40 mg total) by mouth daily. 90 tablet 3   sertraline (ZOLOFT) 100 MG tablet Take 1 tablet (100 mg total) by mouth daily. 90 tablet 1   terbinafine (LAMISIL) 250 MG tablet Take 1 tablet (250 mg total) by mouth daily. 56 tablet 0   metoprolol tartrate (LOPRESSOR) 100 MG tablet Take 1 tablet (100 mg total) by mouth 2 (two) times daily. 180 tablet 1   No current facility-administered medications on file prior to visit.     ROS see history of present illness  Objective  Physical Exam Vitals:   02/07/23 1017  BP: 132/84  Pulse: 80  Temp: 98.5 F (36.9 C)  SpO2: 99%  BP Readings from Last 3 Encounters:  02/07/23 132/84  01/24/23 107/73  01/17/23 (!) 140/88   Wt Readings from Last 3 Encounters:  02/07/23 189 lb 12.8 oz (86.1 kg)  01/23/23 192 lb (87.1 kg)  01/17/23 189 lb 9.6 oz (86 kg)    Physical Exam Constitutional:      General: He is not in acute distress.    Appearance: He is not diaphoretic.  Cardiovascular:     Rate and Rhythm: Normal rate and regular rhythm.     Heart sounds: Normal heart sounds.  Pulmonary:     Effort: Pulmonary effort is normal.     Breath sounds: Normal breath sounds.  Skin:    General: Skin is warm and dry.  Neurological:     Mental Status: He is alert.     Comments: 5/5 strength in bilateral biceps, triceps,  grip, quads, hamstrings, plantar and dorsiflexion, sensation to light touch intact in bilateral UE and LE, normal gait      Assessment/Plan: Please see individual problem list.  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  It is concerning the patient is still having exertional chest pain and shortness of breath.  I will reach out to his cardiology team to get their input on the neck step in management.  For now he will continue amlodipine 10 mg daily, aspirin 81 mg daily, metoprolol 100 mg twice daily, and losartan 100 mg daily.   Hypokalemia -     Basic metabolic panel  DDD (degenerative disc disease), lumbar -     Ambulatory referral to Neurosurgery  Chronic bilateral low back pain with bilateral sciatica Assessment & Plan: Chronic issue.  Refer to neurosurgery to get their input on his symptoms.   S/P TAVR (transcatheter aortic valve replacement) Assessment & Plan: Patient reports he needs a prescription for amoxicillin for dental procedures.  This was sent to his pharmacy.  Orders: -     Amoxicillin; Take 4 tablets (2,000 mg total) by mouth once for 1 dose. Take 60 minutes before dental cleaning  Dispense: 2 tablet; Refill: 1  Chronic nonintractable headache, unspecified headache type Assessment & Plan: This is a chronic issue.  He has had negative imaging.  Certainly it is possible some of his medications could be contributing or this could be coming from his degenerative changes in his cervical spine.  He will monitor for now.     Return in about 3 months (around 05/09/2023).   Marikay Alar, MD [facility]

## 2023-02-07 NOTE — Assessment & Plan Note (Signed)
Chronic issue.  Refer to neurosurgery to get their input on his symptoms.

## 2023-02-07 NOTE — Patient Instructions (Signed)
Nice to see you. Neurosurgery should reach out to you to schedule an appointment for your back. I will let you know what cardiology says when I hear back from them. We will contact you with your lab results.

## 2023-02-07 NOTE — Telephone Encounter (Signed)
Called with interpreter to schedule sooner VM Full

## 2023-02-08 ENCOUNTER — Telehealth: Payer: Self-pay | Admitting: Family Medicine

## 2023-02-08 NOTE — Telephone Encounter (Signed)
Spoke to Patient and he is aware the cardiologist will call to set up a visit to discuss cardiac catheterization.

## 2023-02-08 NOTE — Telephone Encounter (Signed)
-----   Message from Eula Listen sent at 02/07/2023  1:16 PM EDT ----- Hi Dr. Birdie Sons,  Thanks for the update on Mr. Beauchesne.  As I am sure you are aware, he has an extensive CAD history with history of chronic angina that has been difficult to manage antianginal therapy in the setting of intolerance to multiple medications.  We suspect his ongoing angina is likely in the setting of an occluded left circumflex and graft to OM3.  I will get our schedulers to reach out to him to set up an appointment with myself or Dr. Kirke Corin to discuss likely cardiac cath.  Take care,  Ryan ----- Message ----- From: Glori Luis, MD Sent: 02/07/2023  11:29 AM EDT To: Sondra Barges, PA-C  Hi Ryan,   I saw this patient for follow-up today. He noted continued issues with exertional CP and SOB. It looks like you saw him in August and noted that if this continued he may need to have a catheterization. I wanted to get your input on the next steps for his symptoms. Thanks.  Minerva Areola

## 2023-02-08 NOTE — Telephone Encounter (Signed)
Please let the patient know that cardiology should be reaching out to him to get him scheduled for a visit to discuss cardiac catheterization.  If he does not hear from them in the next few days to week he needs to let us know.

## 2023-02-09 ENCOUNTER — Other Ambulatory Visit: Payer: Self-pay

## 2023-02-09 NOTE — Telephone Encounter (Signed)
Received fax from [facility] to correct prescription sent.  Was sent for 2 tabs but sig says 4 tablets totaling 2,000mg  prior to dental cleaning.  Prescription pended for approval.

## 2023-02-10 MED ORDER — AMOXICILLIN 500 MG PO CAPS: 2000.0000 mg | ORAL_CAPSULE | Freq: Once | ORAL | 0 refills | Status: AC

## 2023-02-10 NOTE — Progress Notes (Signed)
Referring Physician:  Glori Luis, MD [address] [place],  [place] [address]  Primary Physician:  Glori Luis, MD  Interpreter used as he speaks spanish.   History of Present Illness: 02/17/2023 [name] has a history of CAD, HTN, GERD, history of CABG, hyperlipidemia, obesity, s/p aortic valve replacement, hypercholesterolemia.   He has seen Dr. Wynn Banker with PMR.   History of chronic back and bilateral leg pain.   He has constant LBP with radiation to buttocks and into his bilateral lateral thighs x years and worse in last 6 months. Pain is worse with walking, prolonged sitting/driving. He has numbness and tingling with weakness in his legs. Right leg pain > left leg pain.   Bowel/Bladder Dysfunction: none  He does not smoke.  Conservative measures:  Physical therapy: [facility] May to 11/23/22- does not look like he was discharged.  Multimodal medical therapy including regular antiinflammatories: advil, tylenol  Injections:  he epidural steroid injections years ago   Past Surgery: no spinal surgery  James Yates has no symptoms of cervical myelopathy.  The symptoms are causing a significant impact on the patient's life.   Review of Systems:  A 10 point review of systems is negative, except for the pertinent positives and negatives detailed in the HPI.  Past Medical History: Past Medical History:  Diagnosis Date   Coronary artery disease    s/p CABG   Glucose intolerance (impaired glucose tolerance)    Headache    chronic, no red flag or atypical signs or symptoms   Hyperlipidemia    Hypertension    Obesity    S/P TAVR (transcatheter aortic valve replacement)    26 mm Edwards Sapien 3 transcatheter heart valve placed via percutaneous right transfemoral approach    Severe aortic stenosis     Past Surgical History: Past Surgical History:  Procedure Laterality Date   CARDIAC CATHETERIZATION  06/2013   [facility]   CARDIAC  CATHETERIZATION N/A 10/29/2015   Procedure: Left Heart Cath and Cors/Grafts Angiography;  Surgeon: Iran Ouch, MD;  Location: [facility] INVASIVE CV [facility];  Service: Cardiovascular;  Laterality: N/A;   CORONARY ARTERY BYPASS GRAFT N/A 07/05/2013   Procedure: CORONARY ARTERY BYPASS GRAFTING (CABG);  Surgeon: [name], MD;  Location: [facility] OR;  Service: Open Heart Surgery;  Laterality: N/A;   INTRAOPERATIVE TRANSESOPHAGEAL ECHOCARDIOGRAM N/A 07/05/2013   Procedure: INTRAOPERATIVE TRANSESOPHAGEAL ECHOCARDIOGRAM;  Surgeon: [name], MD;  Location: [facility] OR;  Service: Open Heart Surgery;  Laterality: N/A;   INTRAOPERATIVE TRANSTHORACIC ECHOCARDIOGRAM N/A 03/13/2018   Procedure: INTRAOPERATIVE TRANSTHORACIC ECHOCARDIOGRAM;  Surgeon: Kathleene Hazel, MD;  Location: [facility] OR;  Service: Open Heart Surgery;  Laterality: N/A;   NO PAST SURGERIES     RIGHT/LEFT HEART CATH AND CORONARY ANGIOGRAPHY Bilateral 11/27/2017   Procedure: RIGHT/LEFT HEART CATH AND CORONARY ANGIOGRAPHY;  Surgeon: Iran Ouch, MD;  Location: [facility] INVASIVE CV LAB;  Service: Cardiovascular;  Laterality: Bilateral;   TRANSCATHETER AORTIC VALVE REPLACEMENT, TRANSFEMORAL N/A 03/13/2018   Procedure: TRANSCATHETER AORTIC VALVE REPLACEMENT, TRANSFEMORAL. Edwards Sapien 3 Transcatheter Heart Valve size 26mm.;  Surgeon: Kathleene Hazel, MD;  Location: [facility] OR;  Service: Open Heart Surgery;  Laterality: N/A;    Allergies: Allergies as of 02/17/2023   (No Known Allergies)    Medications: Outpatient Encounter Medications as of 02/17/2023  Medication Sig   amLODipine (NORVASC) 10 MG tablet Take 1 tablet (10 mg total) by mouth daily.   aspirin EC 81 MG tablet  Take 1 tablet (81 mg total) by mouth daily.   losartan (COZAAR) 100 MG tablet Take 1 tablet (100 mg total) by mouth daily.   meclizine (ANTIVERT) 25 MG tablet Take 1 tablet (25 mg total) by mouth 3 (three) times daily as needed for dizziness.   metoprolol tartrate  (LOPRESSOR) 100 MG tablet Take 1 tablet (100 mg total) by mouth 2 (two) times daily.   nitroGLYCERIN (NITROSTAT) 0.4 MG SL tablet Place 1 tablet (0.4 mg total) under the tongue every 5 (five) minutes as needed for chest pain.   pantoprazole (PROTONIX) 40 MG tablet TAKE 1 TABLET BY MOUTH TWO TIMES DAILY BEFORE A MEAL FOR 30 DAYS, THEN 1 TABLET BY MOUTH DAILY BEFORE BREAKFAST   rosuvastatin (CRESTOR) 40 MG tablet Take 1 tablet (40 mg total) by mouth daily.   sertraline (ZOLOFT) 100 MG tablet Take 1 tablet (100 mg total) by mouth daily.   terbinafine (LAMISIL) 250 MG tablet Take 1 tablet (250 mg total) by mouth daily.   [EXPIRED] amoxicillin (AMOXIL) 500 MG capsule Take 4 capsules (2,000 mg total) by mouth once for 1 dose. Take 60 minutes before dental cleaning.   No facility-administered encounter medications on file as of 02/17/2023.    Social History: Social History   Tobacco Use   Smoking status: Never   Smokeless tobacco: Never  Vaping Use   Vaping status: Never Used  Substance Use Topics   Alcohol use: No   Drug use: No    Family Medical History: Family History  Problem Relation Age of Onset   Heart disease Father    Heart attack Father     Physical Examination: Vitals:   02/17/23 0837  BP: 115/73    General: Patient is well developed, well nourished, calm, collected, and in no apparent distress. Attention to examination is appropriate.  Respiratory: Patient is breathing without any difficulty.   NEUROLOGICAL:     Awake, alert, oriented to person, place, and time.  Speech is clear and fluent. Fund of knowledge is appropriate.   Cranial Nerves: Pupils equal round and reactive to light.  Facial tone is symmetric.    Mild lower posterior lumbar tenderness.   No abnormal lesions on exposed skin.   Strength: Side Biceps Triceps Deltoid Interossei Grip Wrist Ext. Wrist Flex.  R 5 5 5 5 5 5 5   L 5 5 5 5 5 5 5    Side Iliopsoas Quads Hamstring PF DF EHL  R 5 5 5 5 5  5   L 5 5 5 5 5 5    Reflexes are 2+ and symmetric at the biceps, brachioradialis, patella and achilles.   Hoffman's is absent.  Clonus is not present.   Bilateral upper and lower extremity sensation is intact to light touch.     Gait is normal.    Medical Decision Making  Imaging: MRI of lumbar spine dated 01/11/23:  FINDINGS: Segmentation:  Standard.   Alignment:  Physiologic.   Vertebrae: No acute fracture, evidence of discitis, or aggressive bone lesion.   Conus medullaris and cauda equina: Conus extends to the T12-L1 level. Conus and cauda equina appear normal.   Paraspinal and other soft tissues: No acute paraspinal abnormality.   Disc levels:   Disc spaces: Disc desiccation at L2-3, L3-4 and L5-S1. Minimal disc height loss at L4-5.   T12-L1: No significant disc bulge. No neural foraminal stenosis. No central canal stenosis.   L1-L2: No significant disc bulge. No neural foraminal stenosis. No central canal stenosis.  L2-L3: No significant disc bulge. Mild bilateral facet arthropathy. No foraminal or central canal stenosis.   L3-L4: Broad-based disc bulge with a right paracentral annular fissure. Mild bilateral facet arthropathy. No foraminal or central canal stenosis.   L4-L5: Broad-based disc bulge with a small central/right paracentral disc protrusion contacting the right intraspinal L5 nerve root. Mild bilateral facet arthropathy. No foraminal stenosis. No spinal stenosis. Bilateral lateral recess stenosis.   L5-S1: Broad-based disc bulge with a small left paracentral annular fissure. Mild bilateral facet arthropathy. No foraminal or central canal stenosis.   IMPRESSION: 1. At L4-5 there is a broad-based disc bulge with a small central/right paracentral disc protrusion contacting the right intraspinal L5 nerve root. Mild bilateral facet arthropathy. No foraminal stenosis. No spinal stenosis. Bilateral lateral recess stenosis. 2. At L3-4 there is a  broad-based disc bulge with a right paracentral annular fissure. Mild bilateral facet arthropathy. No foraminal or central canal stenosis. 3. At L5-S1 there is a broad-based disc bulge with a small left paracentral annular fissure. Mild bilateral facet arthropathy. No foraminal or central canal stenosis. 4.  No acute osseous injury of the lumbar spine.     Electronically Signed   By: Elige Ko M.D.   On: 01/23/2023 08:10  I have personally reviewed the images and agree with the above interpretation.   Assessment and Plan: [name] is a pleasant 67 y.o. male who has constant LBP with radiation to buttocks and into his bilateral lateral thighs x years and worse in last 6 months. Pain is worse with walking, prolonged sitting/driving. He has numbness and tingling with weakness in his legs. Right leg pain > left leg pain.   He has known lumbar spondylosis with right paracentral disc L4-L5 with bilateral lateral recess stenosis. LBP is likely due to spondylosis. Leg pain may be from L4-L5.  Treatment options discussed with patient and following plan made:   - No relief with previous PT (was not discharged). Will hold on revisiting for now.  - Referral to pain management at [facility]) to discuss lumbar injections.  - Follow up with me in 6-8 weeks for recheck.   I spent a total of 30 minutes in face-to-face and non-face-to-face activities related to this patient's care today including review of outside records, review of imaging, review of symptoms, physical exam, discussion of differential diagnosis, discussion of treatment options, and documentation.   Thank you for involving me in the care of this patient.   Drake Leach PA-C Dept. of Neurosurgery

## 2023-02-10 NOTE — Telephone Encounter (Signed)
Sent to pharmacy 

## 2023-02-11 NOTE — Progress Notes (Unsigned)
Cardiology Office Note    Date:  02/14/2023   ID:  James Yates, DOB April 14, 1956, MRN [id number]  PCP:  Glori Luis, MD  Cardiologist:  Lorine Bears, MD  Electrophysiologist:  None   Chief Complaint: Follow-up  History of Present Illness:   James Yates is a 67 y.o. male with history of CAD s/p CABG in 06/2013 following a NSTEMI with chronic angina, bicuspid aortic valve with severe stenosis s/p TAVR in 02/2018, paroxysmal SVT, HTN, HLD, and RBBB who presents for follow up of CAD and chronic angina.   LHC in 10/2015 showed severe underlying 3-vessel CAD with a patent LIMA-LAD, and SVG-RCA. The SVG-LCx was occluded. The native LCx was occluded proximally with faint collaterals. There was moderate aortic stenosis with a peak to peak gradient of 22 mmHg. LHC was reviewed with our CTO team with the opinion being the LCx was not optimal for PCI. Echo in 09/2017 showed an EF of 60-65%, normal wall motion, progression of his aortic valve stenosis to severe with a peak velocity of 422 cm/s, mean gradient of 38 mmHg, and apeak gradient of 71 mmHg, left atrium was normal in size, RVSF normal, PASP 46 mmHg. He was seen on 11/23/2017 with worsening exertional chest tightness. He underwent R/LHC on 11/27/2017 that showed significant underlying 3-vessel CAD with a patent LIMA to LAD (could not be selectively engaged), patent SVG to RCA and known occluded SVG to OM3 along with an occluded LCx. There was moderate to severe aortic stenosis with a mean gradient of 26 mmHg and a valve area of 1.2. However, his aortic stenosis was felt to be likely severe based on echo findings and his valve area noted on 11/27/2017 was felt to be overestimated due to high cardiac output. Valve area of echo noted to be 0.6. The aortic valve morphology was also consistent with severe aortic stenosis. RHC showed mildly elevated filling pressures with mild pulmonary hypertension. He subsequently underwent TAVR in 02/2018. Echo from  02/2019 showed an EF of 60-65%, normal RVSF and ventricular cavity size, normal PASP, and a normal functioning bioprosthetic aortic valve with a mean gradient of 10 mmHg.  He underwent Zio in 2021 patch monitoring which showed a predominant rhythm of sinus with an average heart rate of 89 bpm, IVCD was noted, 10 episodes of SVT with the longest episode lasting 16 beats with an average heart rate of 152 bpm, rare PACs and PVCs.  Some patient triggered events correlated with PVCs and sinus tachycardia.  Echo in 03/2020 showed an EF of 50 to 55%, no regional wall motion abnormalities, normal LV diastolic function parameters, normal RV systolic function and ventricular cavity size, and normal structure and function of aortic valve prosthesis.     He was seen in the office in 05/2021 and had lost his health insurance briefly.  In this setting, he had ran out of medications.  Given this, he was not taking his antihypertensive medications and reported worsening chest pain and shortness of breath.  Symptoms were felt to be in the setting of being off antianginal medications.  At time of his visit, he was back under health insurance.   He was seen on 02/02/2022 continuing to note some exertional chest discomfort that was consistent what he was feeling at his visit in early 2023.  He also continued to note unchanged and longstanding palpitations.  Lexiscan MPI on 02/10/2022 was overall low risk with no significant ischemia with a small region of fixed defect in the  apical, distal anterior apical, and inferior apical region and an EF of 46%.  CT attenuated corrected images showed a mild aortic atherosclerosis, moderate coronary artery calcification, and TAVR valve.   In follow up on 04/11/2022, he noted stable chest discomfort.  Echo on 04/18/2022 demonstrated an EF of 55 to 60%, no regional wall motion abnormalities, indeterminate LV diastolic function parameters, low normal RV systolic function with normal ventricular  cavity size, normal structure and function of aortic valve prosthesis, and an estimated right atrial pressure of 3 mmHg.   He was seen in 04/2022 with shoulder pain, headache, and intermittent bleeding from the gums.  Amlodipine was discontinued to see if this would help with his gingival bleeding.  He was prescribed ranolazine, though could not afford the medication.  He was started on Imdur, though this worsened his headache.  He was last seen in the office in 07/2022, and he continued to report stable exertional chest pain and shortness of breath with overexertion.  Symptoms were felt to be likely due to occluded LCx and graft to OM 3.  Imdur was discontinued due to headache.  Losartan was titrated to 100 mg daily with continuation of metoprolol.  Rosuvastatin was titrated to 40 mg daily.  He was last seen in the office on 01/17/2023 and reported his chest discomfort was improved from prior, though did continue to have some exertional shortness of breath and chest discomfort.  Discontinuation of Imdur did not improve headache.  Amlodipine was titrated to 10 mg daily with continuation of Lopressor 100 mg twice daily.  He was seen in the ED on 01/24/2023 with multiple complaints including chest pressure lasting for several hours improved with nitroglycerin as well as headache, dizziness, and imbalance.  High-sensitivity troponin 29 with a delta troponin 26.  Chest x-ray without active cardiopulmonary disease.  CTA of the head and neck showed no emergent large vessel occlusion or hemodynamically significant stenosis of the head/neck with mild bilateral carotid bifurcation atherosclerosis without hemodynamically significant stenosis.  CTA chest showed no acute intrathoracic pathology or PE.  MRI of the brain was unrevealing.  Outpatient follow-up was recommended.  He comes in today continuing to note intermittent chest discomfort that is largely unchanged when compared to his prior visits.  He has continued to  have this discomfort despite improvement in blood pressure readings.  He also continues to note exertional fatigue and shortness of breath.  No falls or symptoms concerning for bleeding.  No dizziness, presyncope, or syncope.  Adherent and tolerating cardiac medications.  Headache improved.   Labs independently reviewed: 01/2023 - potassium 3.6, BUN 15, serum creatinine 0.96 12/2022 - magnesium 2.2, albumin 4.6, AST/ALT normal, Hgb 15.0, PLT 154, A1c 6.8 03/2022 - TC 223, TG 274, HDL 42, LDL 126   Past Medical History:  Diagnosis Date   Coronary artery disease    s/p CABG   Glucose intolerance (impaired glucose tolerance)    Headache    chronic, no red flag or atypical signs or symptoms   Hyperlipidemia    Hypertension    Obesity    S/P TAVR (transcatheter aortic valve replacement)    26 mm Edwards Sapien 3 transcatheter heart valve placed via percutaneous right transfemoral approach    Severe aortic stenosis     Past Surgical History:  Procedure Laterality Date   CARDIAC CATHETERIZATION  06/2013   [facility]   CARDIAC CATHETERIZATION N/A 10/29/2015   Procedure: Left Heart Cath and Cors/Grafts Angiography;  Surgeon: Iran Ouch, MD;  Location: [facility] INVASIVE CV LAB;  Service: Cardiovascular;  Laterality: N/A;   CORONARY ARTERY BYPASS GRAFT N/A 07/05/2013   Procedure: CORONARY ARTERY BYPASS GRAFTING (CABG);  Surgeon: [name], MD;  Location: [facility] OR;  Service: Open Heart Surgery;  Laterality: N/A;   INTRAOPERATIVE TRANSESOPHAGEAL ECHOCARDIOGRAM N/A 07/05/2013   Procedure: INTRAOPERATIVE TRANSESOPHAGEAL ECHOCARDIOGRAM;  Surgeon: [name], MD;  Location: [facility] OR;  Service: Open Heart Surgery;  Laterality: N/A;   INTRAOPERATIVE TRANSTHORACIC ECHOCARDIOGRAM N/A 03/13/2018   Procedure: INTRAOPERATIVE TRANSTHORACIC ECHOCARDIOGRAM;  Surgeon: Kathleene Hazel, MD;  Location: [facility] OR;  Service: Open Heart Surgery;  Laterality: N/A;   NO PAST SURGERIES     RIGHT/LEFT HEART CATH AND  CORONARY ANGIOGRAPHY Bilateral 11/27/2017   Procedure: RIGHT/LEFT HEART CATH AND CORONARY ANGIOGRAPHY;  Surgeon: Iran Ouch, MD;  Location: [facility] INVASIVE CV LAB;  Service: Cardiovascular;  Laterality: Bilateral;   TRANSCATHETER AORTIC VALVE REPLACEMENT, TRANSFEMORAL N/A 03/13/2018   Procedure: TRANSCATHETER AORTIC VALVE REPLACEMENT, TRANSFEMORAL. Edwards Sapien 3 Transcatheter Heart Valve size 26mm.;  Surgeon: Kathleene Hazel, MD;  Location: [facility] OR;  Service: Open Heart Surgery;  Laterality: N/A;    Current Medications: Current Meds  Medication Sig   amLODipine (NORVASC) 10 MG tablet Take 1 tablet (10 mg total) by mouth daily.   aspirin EC 81 MG tablet Take 1 tablet (81 mg total) by mouth daily.   losartan (COZAAR) 100 MG tablet Take 1 tablet (100 mg total) by mouth daily.   meclizine (ANTIVERT) 25 MG tablet Take 1 tablet (25 mg total) by mouth 3 (three) times daily as needed for dizziness.   metoprolol tartrate (LOPRESSOR) 100 MG tablet Take 1 tablet (100 mg total) by mouth 2 (two) times daily.   nitroGLYCERIN (NITROSTAT) 0.4 MG SL tablet Place 1 tablet (0.4 mg total) under the tongue every 5 (five) minutes as needed for chest pain.   pantoprazole (PROTONIX) 40 MG tablet TAKE 1 TABLET BY MOUTH TWO TIMES DAILY BEFORE A MEAL FOR 30 DAYS, THEN 1 TABLET BY MOUTH DAILY BEFORE BREAKFAST   rosuvastatin (CRESTOR) 40 MG tablet Take 1 tablet (40 mg total) by mouth daily.   sertraline (ZOLOFT) 100 MG tablet Take 1 tablet (100 mg total) by mouth daily.   terbinafine (LAMISIL) 250 MG tablet Take 1 tablet (250 mg total) by mouth daily.    Allergies:   Patient has no known allergies.   Social History   Socioeconomic History   Marital status: Married    Spouse name: Not on file   Number of children: Not on file   Years of education: Not on file   Highest education level: Not on file  Occupational History   Not on file  Tobacco Use   Smoking status: Never   Smokeless tobacco: Never   Vaping Use   Vaping status: Never Used  Substance and Sexual Activity   Alcohol use: No   Drug use: No   Sexual activity: Not on file  Other Topics Concern   Not on file  Social History Narrative   Not on file   Social Determinants of Health   Financial Resource Strain: Not on file  Food Insecurity: Not on file  Transportation Needs: Not on file  Physical Activity: Not on file  Stress: Not on file  Social Connections: Not on file     Family History:  The patient's family history includes Heart attack in his father; Heart disease in his father.  ROS:   12-point review of systems is  negative unless otherwise noted in the HPI.   EKGs/Labs/Other Studies Reviewed:    Studies reviewed were summarized above. The additional studies were reviewed today:  2D echo 04/18/2022: 1. Left ventricular ejection fraction, by estimation, is 55 to 60%. The  left ventricle has normal function. The left ventricle has no regional  wall motion abnormalities. Left ventricular diastolic parameters are  indeterminate. The average left  ventricular global longitudinal strain is -22.8 %. The global longitudinal  strain is normal.   2. Right ventricular systolic function is low normal. The right  ventricular size is normal.   3. The mitral valve is normal in structure. No evidence of mitral valve  regurgitation.   4. The aortic valve has been repaired/replaced. Aortic valve  regurgitation is not visualized. Echo findings are consistent with normal  structure and function of the aortic valve prosthesis. Aortic valve mean  gradient measures 11.0 mmHg.   5. The inferior vena cava is normal in size with greater than 50%  respiratory variability, suggesting right atrial pressure of 3 mmHg.   Comparison(s): Echocardiogram done 04/16/20 showed an EF of 50-55%.  __________   Eugenie Birks MPI 02/10/2022: Pharmacological myocardial perfusion imaging study with no significant  ischemia Small region fixed  defect in the apical, distal anterior apical, inferior apical region, unable to exclude small region prior MI Normal wall motion, EF estimated at 46% No EKG changes concerning for ischemia at peak stress or in recovery. CT attenuation correction images with mild aortic atherosclerosis and moderate coronary calcification, TAVR valve noted Low risk scan __________   Zio patch 03/2020: Normal sinus rhythm with an average heart rate 89 bpm.  IVCD. 10 episodes of supraventricular tachycardia.  The longest lasted 16 beats with an average heart rate of 152 bpm. Rare PACs and rare PVCs. Some triggered events correlated with PVCs and sinus tachycardia. __________   2D echo 03/2020: 1. Left ventricular ejection fraction, by estimation, is 50 to 55%. Left  ventricular ejection fraction by 3D volume is 53 %. The left ventricle has  low normal function. The left ventricle has no regional wall motion  abnormalities. Left ventricular  diastolic parameters were normal.   2. Right ventricular systolic function is normal. The right ventricular  size is normal.   3. The mitral valve is normal in structure. No evidence of mitral valve  regurgitation.   4. The aortic valve has been repaired/replaced. Aortic valve  regurgitation is not visualized. Echo findings are consistent with normal  structure and function of the aortic valve prosthesis. ___________   See Epic for remaining cardiac studies   EKG:  EKG is ordered today.  The EKG ordered today demonstrates NSR, 84 bpm, RBBB with prior inferior infarct, consistent with prior tracing  Recent Labs: 01/24/2023: ALT 19; Hemoglobin 15.0; Magnesium 2.2; Platelets 154 02/07/2023: BUN 15; Creatinine, Ser 0.96; Potassium 3.6; Sodium 140  Recent Lipid Panel    Component Value Date/Time   CHOL 223 (H) 04/11/2022 1057   CHOL 193 03/31/2020 1019   CHOL 215 (H) 07/02/2013 0058   TRIG 274 (H) 04/11/2022 1057   TRIG 143 07/02/2013 0058   HDL 42 04/11/2022  1057   HDL 48 03/31/2020 1019   HDL 38 (L) 07/02/2013 0058   CHOLHDL 5.3 04/11/2022 1057   VLDL 55 (H) 04/11/2022 1057   VLDL 29 07/02/2013 0058   LDLCALC 126 (H) 04/11/2022 1057   LDLCALC 117 (H) 03/31/2020 1019   LDLCALC 148 (H) 07/02/2013 0058    PHYSICAL EXAM:  VS:  BP 126/76 (BP Location: Left Arm, Patient Position: Sitting, Cuff Size: Normal)   Pulse 84   Ht 5\' 5"  (1.651 m)   Wt 190 lb (86.2 kg)   SpO2 97%   BMI 31.62 kg/m   BMI: Body mass index is 31.62 kg/m.  Physical Exam Vitals reviewed.  Constitutional:      Appearance: He is well-developed.  HENT:     Head: Normocephalic and atraumatic.  Eyes:     General:        Right eye: No discharge.        Left eye: No discharge.  Neck:     Vascular: No JVD.  Cardiovascular:     Rate and Rhythm: Normal rate and regular rhythm.     Heart sounds: S1 normal and S2 normal. Heart sounds not distant. No midsystolic click and no opening snap. Murmur heard.     Systolic murmur is present with a grade of 2/6 at the upper right sternal border.     No friction rub.  Pulmonary:     Effort: Pulmonary effort is normal. No respiratory distress.     Breath sounds: Normal breath sounds. No decreased breath sounds, wheezing or rales.  Chest:     Chest wall: No tenderness.  Abdominal:     General: There is no distension.  Musculoskeletal:     Cervical back: Normal range of motion.     Right lower leg: No edema.     Left lower leg: No edema.  Skin:    General: Skin is warm and dry.     Nails: There is no clubbing.  Neurological:     Mental Status: He is alert and oriented to person, place, and time.  Psychiatric:        Speech: Speech normal.        Behavior: Behavior normal.        Thought Content: Thought content normal.        Judgment: Judgment normal.     Wt Readings from Last 3 Encounters:  02/14/23 190 lb (86.2 kg)  02/07/23 189 lb 12.8 oz (86.1 kg)  01/23/23 192 lb (87.1 kg)     ASSESSMENT & PLAN:   CAD  status post CABG with accelerating angina: Currently without symptoms of angina or cardiac decompensation.  Last had chest discomfort on 9/15.  Currently on maximally tolerated antianginal therapy with amlodipine 10 mg and Lopressor 100 mg twice daily.  Intolerant to Imdur secondary to headache.  Could not afford ranolazine.  Symptoms of accelerating angina are likely due to occluded LCx and vein graft to OM 3.  However, cannot exclude progressive ischemic heart disease along other bypass grafts.  Given persistent symptoms despite maximally tolerated antianginal therapy, and with improvement in blood pressure, we will proceed with LHC.  Severe aortic stenosis status post TAVR: Most recent echo showed normal LV systolic function with normal functioning TAVR prosthesis.  Continue SBE prophylaxis before dental procedures.  Remains on aspirin.  HTN: Blood pressure is well-controlled in the office today.  He remains on amlodipine, losartan, and Lopressor.  Low-sodium diet recommended.  HLD: LDL 126 in 03/2022 with target LDL being at least less than 70.  Obtain CMP and lipid panel.  Remains on rosuvastatin 40 mg.  Escalate lipid-lowering therapy as indicated to achieve target LDL.  Intractable headache: Imaging unrevealing.  Currently improved.  Language barrier: [place] medical interpreter utilized for today's visit.   Informed Consent   Shared Decision Making/Informed Consent{  The risks [stroke (1 in 1000), death (1 in 1000), kidney failure [usually temporary] (1 in 500), bleeding (1 in 200), allergic reaction [possibly serious] (1 in 200)], benefits (diagnostic support and management of coronary artery disease) and alternatives of a cardiac catheterization were discussed in detail with Mr. Cutrer and he is willing to proceed.       Disposition: F/u with Dr. Kirke Corin or an APP in 1 month.   Medication Adjustments/Labs and Tests Ordered: Current medicines are reviewed at length with the  patient today.  Concerns regarding medicines are outlined above. Medication changes, Labs and Tests ordered today are summarized above and listed in the Patient Instructions accessible in Encounters.   Signed, Eula Listen, PA-C 02/14/2023 12:46 PM     [facility] [address] [place], [place] [address] [phone]

## 2023-02-11 NOTE — H&P (View-Only) (Signed)
Cardiology Office Note    Date:  02/14/2023   ID:  James Yates, DOB 1955/07/16, MRN [id number]  PCP:  Glori Luis, MD  Cardiologist:  Lorine Bears, MD  Electrophysiologist:  None   Chief Complaint: Follow-up  History of Present Illness:   James Yates is a 67 y.o. male with history of CAD s/p CABG in 06/2013 following a NSTEMI with chronic angina, bicuspid aortic valve with severe stenosis s/p TAVR in 02/2018, paroxysmal SVT, HTN, HLD, and RBBB who presents for follow up of CAD and chronic angina.   LHC in 10/2015 showed severe underlying 3-vessel CAD with a patent LIMA-LAD, and SVG-RCA. The SVG-LCx was occluded. The native LCx was occluded proximally with faint collaterals. There was moderate aortic stenosis with a peak to peak gradient of 22 mmHg. LHC was reviewed with our CTO team with the opinion being the LCx was not optimal for PCI. Echo in 09/2017 showed an EF of 60-65%, normal wall motion, progression of his aortic valve stenosis to severe with a peak velocity of 422 cm/s, mean gradient of 38 mmHg, and apeak gradient of 71 mmHg, left atrium was normal in size, RVSF normal, PASP 46 mmHg. He was seen on 11/23/2017 with worsening exertional chest tightness. He underwent R/LHC on 11/27/2017 that showed significant underlying 3-vessel CAD with a patent LIMA to LAD (could not be selectively engaged), patent SVG to RCA and known occluded SVG to OM3 along with an occluded LCx. There was moderate to severe aortic stenosis with a mean gradient of 26 mmHg and a valve area of 1.2. However, his aortic stenosis was felt to be likely severe based on echo findings and his valve area noted on 11/27/2017 was felt to be overestimated due to high cardiac output. Valve area of echo noted to be 0.6. The aortic valve morphology was also consistent with severe aortic stenosis. RHC showed mildly elevated filling pressures with mild pulmonary hypertension. He subsequently underwent TAVR in 02/2018. Echo from  02/2019 showed an EF of 60-65%, normal RVSF and ventricular cavity size, normal PASP, and a normal functioning bioprosthetic aortic valve with a mean gradient of 10 mmHg.  He underwent Zio in 2021 patch monitoring which showed a predominant rhythm of sinus with an average heart rate of 89 bpm, IVCD was noted, 10 episodes of SVT with the longest episode lasting 16 beats with an average heart rate of 152 bpm, rare PACs and PVCs.  Some patient triggered events correlated with PVCs and sinus tachycardia.  Echo in 03/2020 showed an EF of 50 to 55%, no regional wall motion abnormalities, normal LV diastolic function parameters, normal RV systolic function and ventricular cavity size, and normal structure and function of aortic valve prosthesis.     He was seen in the office in 05/2021 and had lost his health insurance briefly.  In this setting, he had ran out of medications.  Given this, he was not taking his antihypertensive medications and reported worsening chest pain and shortness of breath.  Symptoms were felt to be in the setting of being off antianginal medications.  At time of his visit, he was back under health insurance.   He was seen on 02/02/2022 continuing to note some exertional chest discomfort that was consistent what he was feeling at his visit in early 2023.  He also continued to note unchanged and longstanding palpitations.  Lexiscan MPI on 02/10/2022 was overall low risk with no significant ischemia with a small region of fixed defect in the  apical, distal anterior apical, and inferior apical region and an EF of 46%.  CT attenuated corrected images showed a mild aortic atherosclerosis, moderate coronary artery calcification, and TAVR valve.   In follow up on 04/11/2022, he noted stable chest discomfort.  Echo on 04/18/2022 demonstrated an EF of 55 to 60%, no regional wall motion abnormalities, indeterminate LV diastolic function parameters, low normal RV systolic function with normal ventricular  cavity size, normal structure and function of aortic valve prosthesis, and an estimated right atrial pressure of 3 mmHg.   He was seen in 04/2022 with shoulder pain, headache, and intermittent bleeding from the gums.  Amlodipine was discontinued to see if this would help with his gingival bleeding.  He was prescribed ranolazine, though could not afford the medication.  He was started on Imdur, though this worsened his headache.  He was last seen in the office in 07/2022, and he continued to report stable exertional chest pain and shortness of breath with overexertion.  Symptoms were felt to be likely due to occluded LCx and graft to OM 3.  Imdur was discontinued due to headache.  Losartan was titrated to 100 mg daily with continuation of metoprolol.  Rosuvastatin was titrated to 40 mg daily.  He was last seen in the office on 01/17/2023 and reported his chest discomfort was improved from prior, though did continue to have some exertional shortness of breath and chest discomfort.  Discontinuation of Imdur did not improve headache.  Amlodipine was titrated to 10 mg daily with continuation of Lopressor 100 mg twice daily.  He was seen in the ED on 01/24/2023 with multiple complaints including chest pressure lasting for several hours improved with nitroglycerin as well as headache, dizziness, and imbalance.  High-sensitivity troponin 29 with a delta troponin 26.  Chest x-ray without active cardiopulmonary disease.  CTA of the head and neck showed no emergent large vessel occlusion or hemodynamically significant stenosis of the head/neck with mild bilateral carotid bifurcation atherosclerosis without hemodynamically significant stenosis.  CTA chest showed no acute intrathoracic pathology or PE.  MRI of the brain was unrevealing.  Outpatient follow-up was recommended.  He comes in today continuing to note intermittent chest discomfort that is largely unchanged when compared to his prior visits.  He has continued to  have this discomfort despite improvement in blood pressure readings.  He also continues to note exertional fatigue and shortness of breath.  No falls or symptoms concerning for bleeding.  No dizziness, presyncope, or syncope.  Adherent and tolerating cardiac medications.  Headache improved.   Labs independently reviewed: 01/2023 - potassium 3.6, BUN 15, serum creatinine 0.96 12/2022 - magnesium 2.2, albumin 4.6, AST/ALT normal, Hgb 15.0, PLT 154, A1c 6.8 03/2022 - TC 223, TG 274, HDL 42, LDL 126   Past Medical History:  Diagnosis Date   Coronary artery disease    s/p CABG   Glucose intolerance (impaired glucose tolerance)    Headache    chronic, no red flag or atypical signs or symptoms   Hyperlipidemia    Hypertension    Obesity    S/P TAVR (transcatheter aortic valve replacement)    26 mm Edwards Sapien 3 transcatheter heart valve placed via percutaneous right transfemoral approach    Severe aortic stenosis     Past Surgical History:  Procedure Laterality Date   CARDIAC CATHETERIZATION  06/2013   [facility]   CARDIAC CATHETERIZATION N/A 10/29/2015   Procedure: Left Heart Cath and Cors/Grafts Angiography;  Surgeon: Iran Ouch, MD;  Location: [facility] INVASIVE CV LAB;  Service: Cardiovascular;  Laterality: N/A;   CORONARY ARTERY BYPASS GRAFT N/A 07/05/2013   Procedure: CORONARY ARTERY BYPASS GRAFTING (CABG);  Surgeon: [name], MD;  Location: [facility] OR;  Service: Open Heart Surgery;  Laterality: N/A;   INTRAOPERATIVE TRANSESOPHAGEAL ECHOCARDIOGRAM N/A 07/05/2013   Procedure: INTRAOPERATIVE TRANSESOPHAGEAL ECHOCARDIOGRAM;  Surgeon: [name], MD;  Location: [facility] OR;  Service: Open Heart Surgery;  Laterality: N/A;   INTRAOPERATIVE TRANSTHORACIC ECHOCARDIOGRAM N/A 03/13/2018   Procedure: INTRAOPERATIVE TRANSTHORACIC ECHOCARDIOGRAM;  Surgeon: Kathleene Hazel, MD;  Location: [facility] OR;  Service: Open Heart Surgery;  Laterality: N/A;   NO PAST SURGERIES     RIGHT/LEFT HEART CATH AND  CORONARY ANGIOGRAPHY Bilateral 11/27/2017   Procedure: RIGHT/LEFT HEART CATH AND CORONARY ANGIOGRAPHY;  Surgeon: Iran Ouch, MD;  Location: [facility] INVASIVE CV LAB;  Service: Cardiovascular;  Laterality: Bilateral;   TRANSCATHETER AORTIC VALVE REPLACEMENT, TRANSFEMORAL N/A 03/13/2018   Procedure: TRANSCATHETER AORTIC VALVE REPLACEMENT, TRANSFEMORAL. Edwards Sapien 3 Transcatheter Heart Valve size 26mm.;  Surgeon: Kathleene Hazel, MD;  Location: [facility] OR;  Service: Open Heart Surgery;  Laterality: N/A;    Current Medications: Current Meds  Medication Sig   amLODipine (NORVASC) 10 MG tablet Take 1 tablet (10 mg total) by mouth daily.   aspirin EC 81 MG tablet Take 1 tablet (81 mg total) by mouth daily.   losartan (COZAAR) 100 MG tablet Take 1 tablet (100 mg total) by mouth daily.   meclizine (ANTIVERT) 25 MG tablet Take 1 tablet (25 mg total) by mouth 3 (three) times daily as needed for dizziness.   metoprolol tartrate (LOPRESSOR) 100 MG tablet Take 1 tablet (100 mg total) by mouth 2 (two) times daily.   nitroGLYCERIN (NITROSTAT) 0.4 MG SL tablet Place 1 tablet (0.4 mg total) under the tongue every 5 (five) minutes as needed for chest pain.   pantoprazole (PROTONIX) 40 MG tablet TAKE 1 TABLET BY MOUTH TWO TIMES DAILY BEFORE A MEAL FOR 30 DAYS, THEN 1 TABLET BY MOUTH DAILY BEFORE BREAKFAST   rosuvastatin (CRESTOR) 40 MG tablet Take 1 tablet (40 mg total) by mouth daily.   sertraline (ZOLOFT) 100 MG tablet Take 1 tablet (100 mg total) by mouth daily.   terbinafine (LAMISIL) 250 MG tablet Take 1 tablet (250 mg total) by mouth daily.    Allergies:   Patient has no known allergies.   Social History   Socioeconomic History   Marital status: Married    Spouse name: Not on file   Number of children: Not on file   Years of education: Not on file   Highest education level: Not on file  Occupational History   Not on file  Tobacco Use   Smoking status: Never   Smokeless tobacco: Never   Vaping Use   Vaping status: Never Used  Substance and Sexual Activity   Alcohol use: No   Drug use: No   Sexual activity: Not on file  Other Topics Concern   Not on file  Social History Narrative   Not on file   Social Determinants of Health   Financial Resource Strain: Not on file  Food Insecurity: Not on file  Transportation Needs: Not on file  Physical Activity: Not on file  Stress: Not on file  Social Connections: Not on file     Family History:  The patient's family history includes Heart attack in his father; Heart disease in his father.  ROS:   12-point review of systems is  negative unless otherwise noted in the HPI.   EKGs/Labs/Other Studies Reviewed:    Studies reviewed were summarized above. The additional studies were reviewed today:  2D echo 04/18/2022: 1. Left ventricular ejection fraction, by estimation, is 55 to 60%. The  left ventricle has normal function. The left ventricle has no regional  wall motion abnormalities. Left ventricular diastolic parameters are  indeterminate. The average left  ventricular global longitudinal strain is -22.8 %. The global longitudinal  strain is normal.   2. Right ventricular systolic function is low normal. The right  ventricular size is normal.   3. The mitral valve is normal in structure. No evidence of mitral valve  regurgitation.   4. The aortic valve has been repaired/replaced. Aortic valve  regurgitation is not visualized. Echo findings are consistent with normal  structure and function of the aortic valve prosthesis. Aortic valve mean  gradient measures 11.0 mmHg.   5. The inferior vena cava is normal in size with greater than 50%  respiratory variability, suggesting right atrial pressure of 3 mmHg.   Comparison(s): Echocardiogram done 04/16/20 showed an EF of 50-55%.  __________   Eugenie Birks MPI 02/10/2022: Pharmacological myocardial perfusion imaging study with no significant  ischemia Small region fixed  defect in the apical, distal anterior apical, inferior apical region, unable to exclude small region prior MI Normal wall motion, EF estimated at 46% No EKG changes concerning for ischemia at peak stress or in recovery. CT attenuation correction images with mild aortic atherosclerosis and moderate coronary calcification, TAVR valve noted Low risk scan __________   Zio patch 03/2020: Normal sinus rhythm with an average heart rate 89 bpm.  IVCD. 10 episodes of supraventricular tachycardia.  The longest lasted 16 beats with an average heart rate of 152 bpm. Rare PACs and rare PVCs. Some triggered events correlated with PVCs and sinus tachycardia. __________   2D echo 03/2020: 1. Left ventricular ejection fraction, by estimation, is 50 to 55%. Left  ventricular ejection fraction by 3D volume is 53 %. The left ventricle has  low normal function. The left ventricle has no regional wall motion  abnormalities. Left ventricular  diastolic parameters were normal.   2. Right ventricular systolic function is normal. The right ventricular  size is normal.   3. The mitral valve is normal in structure. No evidence of mitral valve  regurgitation.   4. The aortic valve has been repaired/replaced. Aortic valve  regurgitation is not visualized. Echo findings are consistent with normal  structure and function of the aortic valve prosthesis. ___________   See Epic for remaining cardiac studies   EKG:  EKG is ordered today.  The EKG ordered today demonstrates NSR, 84 bpm, RBBB with prior inferior infarct, consistent with prior tracing  Recent Labs: 01/24/2023: ALT 19; Hemoglobin 15.0; Magnesium 2.2; Platelets 154 02/07/2023: BUN 15; Creatinine, Ser 0.96; Potassium 3.6; Sodium 140  Recent Lipid Panel    Component Value Date/Time   CHOL 223 (H) 04/11/2022 1057   CHOL 193 03/31/2020 1019   CHOL 215 (H) 07/02/2013 0058   TRIG 274 (H) 04/11/2022 1057   TRIG 143 07/02/2013 0058   HDL 42 04/11/2022  1057   HDL 48 03/31/2020 1019   HDL 38 (L) 07/02/2013 0058   CHOLHDL 5.3 04/11/2022 1057   VLDL 55 (H) 04/11/2022 1057   VLDL 29 07/02/2013 0058   LDLCALC 126 (H) 04/11/2022 1057   LDLCALC 117 (H) 03/31/2020 1019   LDLCALC 148 (H) 07/02/2013 0058    PHYSICAL EXAM:  VS:  BP 126/76 (BP Location: Left Arm, Patient Position: Sitting, Cuff Size: Normal)   Pulse 84   Ht 5\' 5"  (1.651 m)   Wt 190 lb (86.2 kg)   SpO2 97%   BMI 31.62 kg/m   BMI: Body mass index is 31.62 kg/m.  Physical Exam Vitals reviewed.  Constitutional:      Appearance: He is well-developed.  HENT:     Head: Normocephalic and atraumatic.  Eyes:     General:        Right eye: No discharge.        Left eye: No discharge.  Neck:     Vascular: No JVD.  Cardiovascular:     Rate and Rhythm: Normal rate and regular rhythm.     Heart sounds: S1 normal and S2 normal. Heart sounds not distant. No midsystolic click and no opening snap. Murmur heard.     Systolic murmur is present with a grade of 2/6 at the upper right sternal border.     No friction rub.  Pulmonary:     Effort: Pulmonary effort is normal. No respiratory distress.     Breath sounds: Normal breath sounds. No decreased breath sounds, wheezing or rales.  Chest:     Chest wall: No tenderness.  Abdominal:     General: There is no distension.  Musculoskeletal:     Cervical back: Normal range of motion.     Right lower leg: No edema.     Left lower leg: No edema.  Skin:    General: Skin is warm and dry.     Nails: There is no clubbing.  Neurological:     Mental Status: He is alert and oriented to person, place, and time.  Psychiatric:        Speech: Speech normal.        Behavior: Behavior normal.        Thought Content: Thought content normal.        Judgment: Judgment normal.     Wt Readings from Last 3 Encounters:  02/14/23 190 lb (86.2 kg)  02/07/23 189 lb 12.8 oz (86.1 kg)  01/23/23 192 lb (87.1 kg)     ASSESSMENT & PLAN:   CAD  status post CABG with accelerating angina: Currently without symptoms of angina or cardiac decompensation.  Last had chest discomfort on 9/15.  Currently on maximally tolerated antianginal therapy with amlodipine 10 mg and Lopressor 100 mg twice daily.  Intolerant to Imdur secondary to headache.  Could not afford ranolazine.  Symptoms of accelerating angina are likely due to occluded LCx and vein graft to OM 3.  However, cannot exclude progressive ischemic heart disease along other bypass grafts.  Given persistent symptoms despite maximally tolerated antianginal therapy, and with improvement in blood pressure, we will proceed with LHC.  Severe aortic stenosis status post TAVR: Most recent echo showed normal LV systolic function with normal functioning TAVR prosthesis.  Continue SBE prophylaxis before dental procedures.  Remains on aspirin.  HTN: Blood pressure is well-controlled in the office today.  He remains on amlodipine, losartan, and Lopressor.  Low-sodium diet recommended.  HLD: LDL 126 in 03/2022 with target LDL being at least less than 70.  Obtain CMP and lipid panel.  Remains on rosuvastatin 40 mg.  Escalate lipid-lowering therapy as indicated to achieve target LDL.  Intractable headache: Imaging unrevealing.  Currently improved.  Language barrier: [facility] medical interpreter utilized for today's visit.   Informed Consent   Shared Decision Making/Informed Consent{  The risks [stroke (1 in 1000), death (1 in 1000), kidney failure [usually temporary] (1 in 500), bleeding (1 in 200), allergic reaction [possibly serious] (1 in 200)], benefits (diagnostic support and management of coronary artery disease) and alternatives of a cardiac catheterization were discussed in detail with Mr. Saldutti and he is willing to proceed.       Disposition: F/u with Dr. Kirke Corin or an APP in 1 month.   Medication Adjustments/Labs and Tests Ordered: Current medicines are reviewed at length with the  patient today.  Concerns regarding medicines are outlined above. Medication changes, Labs and Tests ordered today are summarized above and listed in the Patient Instructions accessible in Encounters.   Signed, Eula Listen, PA-C 02/14/2023 12:46 PM     [facility] [address] [place], [place] [address] [phone]

## 2023-02-14 ENCOUNTER — Encounter: Payer: Self-pay | Admitting: Physician Assistant

## 2023-02-14 ENCOUNTER — Ambulatory Visit: Payer: Medicare Other | Attending: Physician Assistant | Admitting: Physician Assistant

## 2023-02-14 VITALS — BP 126/76 | HR 84 | Ht 65.0 in | Wt 190.0 lb

## 2023-02-14 DIAGNOSIS — I25110 Atherosclerotic heart disease of native coronary artery with unstable angina pectoris: Secondary | ICD-10-CM | POA: Diagnosis not present

## 2023-02-14 DIAGNOSIS — Z758 Other problems related to medical facilities and other health care: Secondary | ICD-10-CM | POA: Diagnosis present

## 2023-02-14 DIAGNOSIS — I350 Nonrheumatic aortic (valve) stenosis: Secondary | ICD-10-CM

## 2023-02-14 DIAGNOSIS — I10 Essential (primary) hypertension: Secondary | ICD-10-CM | POA: Diagnosis present

## 2023-02-14 DIAGNOSIS — E785 Hyperlipidemia, unspecified: Secondary | ICD-10-CM

## 2023-02-14 DIAGNOSIS — Z952 Presence of prosthetic heart valve: Secondary | ICD-10-CM | POA: Diagnosis present

## 2023-02-14 DIAGNOSIS — G8929 Other chronic pain: Secondary | ICD-10-CM | POA: Diagnosis present

## 2023-02-14 DIAGNOSIS — I25118 Atherosclerotic heart disease of native coronary artery with other forms of angina pectoris: Secondary | ICD-10-CM | POA: Diagnosis present

## 2023-02-14 DIAGNOSIS — Z603 Acculturation difficulty: Secondary | ICD-10-CM | POA: Diagnosis present

## 2023-02-14 DIAGNOSIS — I200 Unstable angina: Secondary | ICD-10-CM | POA: Diagnosis present

## 2023-02-14 DIAGNOSIS — Z951 Presence of aortocoronary bypass graft: Secondary | ICD-10-CM | POA: Diagnosis present

## 2023-02-14 DIAGNOSIS — R519 Headache, unspecified: Secondary | ICD-10-CM

## 2023-02-14 NOTE — Patient Instructions (Signed)
Medication Instructions:  Your Physician recommend you continue on your current medication as directed.    *If you need a refill on your cardiac medications before your next appointment, please call your pharmacy*   Lab Work: Your provider would like for you to have following labs drawn today CMT, CBC, and Lipid Panel.   If you have labs (blood work) drawn today and your tests are completely normal, you will receive your results only by: MyChart Message (if you have MyChart) OR A paper copy in the mail If you have any lab test that is abnormal or we need to change your treatment, we will call you to review the results.   Testing/Procedures:  You are scheduled for a Cardiac Catheterization on Monday, September 23 with Dr. Lorine Bears.  1. Please arrive at the [facility] Entrance of [facility], [address], [place] [address] at 8:30 AM (This is 1 hour(s) prior to your procedure time).  Proceed to the Check-In Desk directly inside the entrance.  Procedure Parking: Use the entrance off of the [address] side of the hospital. Turn right upon entering and follow the driveway to parking that is directly in front of the [facility]. There is no valet parking available at this entrance, however there is an awning directly in front of the [facility] for drop off/ pick up for patients.  Special note: Every effort is made to have your procedure done on time. Please understand that emergencies sometimes delay scheduled procedures.  2. Diet: Do not eat solid foods after midnight.  The patient may have clear liquids until 5am upon the day of the procedure.  3. Labs: You will need to have blood drawn today  4. Medication instructions in preparation for your procedure:   On the morning of your procedure, take your Aspirin 81 mg and any morning medicines.  You may use sips of water.  5. Plan to go home the same day, you will only stay overnight if  medically necessary. 6. Bring a current list of your medications and current insurance cards. 7. You MUST have a responsible person to drive you home. 8. Someone MUST be with you the first 24 hours after you arrive home or your discharge will be delayed. 9. Please wear clothes that are easy to get on and off and wear slip-on shoes.  Thank you for allowing [place] to care for you!   -- [facility]    Follow-Up: At [facility], you and your health needs are our priority.  As part of our continuing mission to provide you with exceptional heart care, we have created designated Provider Care Teams.  These Care Teams include your primary Cardiologist (physician) and Advanced Practice Providers (APPs -  Physician Assistants and Nurse Practitioners) who all work together to provide you with the care you need, when you need it.  We recommend signing up for the patient portal called "MyChart".  Sign up information is provided on this After Visit Summary.  MyChart is used to connect with patients for Virtual Visits (Telemedicine).  Patients are able to view lab/test results, encounter notes, upcoming appointments, etc.  Non-urgent messages can be sent to your provider as well.   To learn more about what you can do with MyChart, go to [email or web address].    Your next appointment:   1 month(s)  Provider:   You may see Lorine Bears, MD or one of the following  Advanced Practice Providers on your designated Care Team:   Eula Listen, [place]

## 2023-02-14 NOTE — Addendum Note (Signed)
Addended by: Sondra Barges on: 02/14/2023 04:19 PM   Modules accepted: Orders

## 2023-02-17 ENCOUNTER — Ambulatory Visit (INDEPENDENT_AMBULATORY_CARE_PROVIDER_SITE_OTHER): Payer: Medicare Other | Admitting: Orthopedic Surgery

## 2023-02-17 ENCOUNTER — Encounter: Payer: Self-pay | Admitting: Orthopedic Surgery

## 2023-02-17 VITALS — BP 115/73 | Ht 65.0 in | Wt 191.2 lb

## 2023-02-17 DIAGNOSIS — M4726 Other spondylosis with radiculopathy, lumbar region: Secondary | ICD-10-CM | POA: Diagnosis not present

## 2023-02-17 DIAGNOSIS — M5126 Other intervertebral disc displacement, lumbar region: Secondary | ICD-10-CM | POA: Diagnosis not present

## 2023-02-17 DIAGNOSIS — M48061 Spinal stenosis, lumbar region without neurogenic claudication: Secondary | ICD-10-CM

## 2023-02-17 DIAGNOSIS — M5416 Radiculopathy, lumbar region: Secondary | ICD-10-CM

## 2023-02-17 DIAGNOSIS — M47816 Spondylosis without myelopathy or radiculopathy, lumbar region: Secondary | ICD-10-CM

## 2023-02-17 NOTE — Patient Instructions (Signed)
It was so nice to see you today. Thank you so much for coming in.    You have some wear and tear in your back (arthritis) and I think this is causing your pain.   I want you to see pain management here in [place] (Dr. Cherylann Ratel) to discuss possible lumbar injections. They should call you to schedule an appointment or you can call them at [phone].   I will see you back in 6-8 weeks. Please do not hesitate to call if you have any questions or concerns. You can also message me in MyChart.   If you have not heard back about the injections by next week, then please call the office so we can help you get them scheduled.   Drake Leach PA-C [phone]

## 2023-02-20 ENCOUNTER — Ambulatory Visit
Admission: RE | Admit: 2023-02-20 | Discharge: 2023-02-20 | Disposition: A | Discharge status: Home / Self Care | Payer: Medicare Other | Attending: Cardiovascular Disease | Admitting: Cardiovascular Disease

## 2023-02-20 ENCOUNTER — Encounter: Payer: Self-pay | Admitting: Cardiovascular Disease

## 2023-02-20 ENCOUNTER — Other Ambulatory Visit: Payer: Self-pay

## 2023-02-20 ENCOUNTER — Encounter
Admission: RE | Disposition: A | Discharge status: Home / Self Care | Payer: Self-pay | Source: Home / Self Care | Attending: Cardiovascular Disease

## 2023-02-20 DIAGNOSIS — I10 Essential (primary) hypertension: Secondary | ICD-10-CM | POA: Insufficient documentation

## 2023-02-20 DIAGNOSIS — Z951 Presence of aortocoronary bypass graft: Secondary | ICD-10-CM | POA: Insufficient documentation

## 2023-02-20 DIAGNOSIS — I25719 Atherosclerosis of autologous vein coronary artery bypass graft(s) with unspecified angina pectoris: Secondary | ICD-10-CM

## 2023-02-20 DIAGNOSIS — Z7982 Long term (current) use of aspirin: Secondary | ICD-10-CM | POA: Insufficient documentation

## 2023-02-20 DIAGNOSIS — Z953 Presence of xenogenic heart valve: Secondary | ICD-10-CM | POA: Insufficient documentation

## 2023-02-20 DIAGNOSIS — I25118 Atherosclerotic heart disease of native coronary artery with other forms of angina pectoris: Secondary | ICD-10-CM | POA: Diagnosis not present

## 2023-02-20 DIAGNOSIS — I252 Old myocardial infarction: Secondary | ICD-10-CM | POA: Insufficient documentation

## 2023-02-20 DIAGNOSIS — I2582 Chronic total occlusion of coronary artery: Secondary | ICD-10-CM | POA: Diagnosis not present

## 2023-02-20 DIAGNOSIS — I350 Nonrheumatic aortic (valve) stenosis: Secondary | ICD-10-CM | POA: Insufficient documentation

## 2023-02-20 DIAGNOSIS — I471 Supraventricular tachycardia, unspecified: Secondary | ICD-10-CM | POA: Diagnosis not present

## 2023-02-20 DIAGNOSIS — E785 Hyperlipidemia, unspecified: Secondary | ICD-10-CM | POA: Diagnosis not present

## 2023-02-20 DIAGNOSIS — I2510 Atherosclerotic heart disease of native coronary artery without angina pectoris: Secondary | ICD-10-CM | POA: Diagnosis present

## 2023-02-20 DIAGNOSIS — Z79899 Other long term (current) drug therapy: Secondary | ICD-10-CM | POA: Diagnosis not present

## 2023-02-20 DIAGNOSIS — R519 Headache, unspecified: Secondary | ICD-10-CM | POA: Insufficient documentation

## 2023-02-20 HISTORY — PX: LEFT HEART CATH AND CORS/GRAFTS ANGIOGRAPHY: CATH118250

## 2023-02-20 SURGERY — LEFT HEART CATH AND CORS/GRAFTS ANGIOGRAPHY
Anesthesia: Moderate Sedation

## 2023-02-20 MED ORDER — SODIUM CHLORIDE 0.9% FLUSH: 3.0000 mL | INTRAVENOUS | Status: DC | PRN

## 2023-02-20 MED ORDER — FENTANYL CITRATE (PF) 100 MCG/2ML IJ SOLN
INTRAMUSCULAR | Status: AC
  Filled 2023-02-20: qty 2

## 2023-02-20 MED ORDER — MIDAZOLAM HCL 2 MG/2ML IJ SOLN
INTRAMUSCULAR | Status: DC | PRN
  Administered 2023-02-20: 1 mg via INTRAVENOUS

## 2023-02-20 MED ORDER — SODIUM CHLORIDE 0.9 % IV SOLN: INTRAVENOUS | Status: DC

## 2023-02-20 MED ORDER — HEPARIN SODIUM (PORCINE) 1000 UNIT/ML IJ SOLN
INTRAMUSCULAR | Status: DC | PRN
  Administered 2023-02-20: 4500 [IU] via INTRAVENOUS

## 2023-02-20 MED ORDER — HEPARIN (PORCINE) IN NACL 2000-0.9 UNIT/L-% IV SOLN
INTRAVENOUS | Status: DC | PRN
  Administered 2023-02-20: 1000 mL

## 2023-02-20 MED ORDER — LIDOCAINE HCL (PF) 1 % IJ SOLN
INTRAMUSCULAR | Status: DC | PRN
  Administered 2023-02-20: 2 mL

## 2023-02-20 MED ORDER — SODIUM CHLORIDE 0.9 % WEIGHT BASED INFUSION
1.0000 mL/kg/h | INTRAVENOUS | Status: DC
  Administered 2023-02-20: 1 mL/kg/h via INTRAVENOUS

## 2023-02-20 MED ORDER — ACETAMINOPHEN 325 MG PO TABS: 650.0000 mg | ORAL_TABLET | ORAL | Status: DC | PRN

## 2023-02-20 MED ORDER — SODIUM CHLORIDE 0.9% FLUSH: 3.0000 mL | Freq: Two times a day (BID) | INTRAVENOUS | Status: DC

## 2023-02-20 MED ORDER — ASPIRIN 81 MG PO CHEW: 81.0000 mg | CHEWABLE_TABLET | ORAL | Status: DC

## 2023-02-20 MED ORDER — SODIUM CHLORIDE 0.9 % WEIGHT BASED INFUSION
3.0000 mL/kg/h | INTRAVENOUS | Status: AC
  Administered 2023-02-20: 3 mL/kg/h via INTRAVENOUS

## 2023-02-20 MED ORDER — HEPARIN (PORCINE) IN NACL 1000-0.9 UT/500ML-% IV SOLN
INTRAVENOUS | Status: AC
  Filled 2023-02-20: qty 1000

## 2023-02-20 MED ORDER — SODIUM CHLORIDE 0.9 % IV SOLN: 250.0000 mL | INTRAVENOUS | Status: DC | PRN

## 2023-02-20 MED ORDER — VERAPAMIL HCL 2.5 MG/ML IV SOLN
INTRAVENOUS | Status: AC
  Filled 2023-02-20: qty 2

## 2023-02-20 MED ORDER — HEPARIN SODIUM (PORCINE) 1000 UNIT/ML IJ SOLN
INTRAMUSCULAR | Status: AC
  Filled 2023-02-20: qty 10

## 2023-02-20 MED ORDER — MIDAZOLAM HCL 2 MG/2ML IJ SOLN
INTRAMUSCULAR | Status: AC
  Filled 2023-02-20: qty 2

## 2023-02-20 MED ORDER — ONDANSETRON HCL 4 MG/2ML IJ SOLN: 4.0000 mg | Freq: Four times a day (QID) | INTRAMUSCULAR | Status: DC | PRN

## 2023-02-20 MED ORDER — IOHEXOL 300 MG/ML  SOLN
INTRAMUSCULAR | Status: DC | PRN
  Administered 2023-02-20: 100 mL

## 2023-02-20 MED ORDER — VERAPAMIL HCL 2.5 MG/ML IV SOLN
INTRAVENOUS | Status: DC | PRN
  Administered 2023-02-20: 2.5 mg via INTRA_ARTERIAL

## 2023-02-20 MED ORDER — FENTANYL CITRATE (PF) 100 MCG/2ML IJ SOLN
INTRAMUSCULAR | Status: DC | PRN
  Administered 2023-02-20: 25 ug via INTRAVENOUS

## 2023-02-20 SURGICAL SUPPLY — 12 items
CATH INFINITI 5 FR IM (CATHETERS) ×1 IMPLANT
CATH INFINITI 5 FR MPA2 (CATHETERS) ×1 IMPLANT
CATH INFINITI 5FR MULTPACK ANG (CATHETERS) ×1 IMPLANT
DEVICE RAD TR BAND REGULAR (VASCULAR PRODUCTS) ×1 IMPLANT
DRAPE BRACHIAL (DRAPES) ×1 IMPLANT
GLIDESHEATH SLEND SS 6F .021 (SHEATH) ×1 IMPLANT
GUIDEWIRE INQWIRE 1.5J.035X260 (WIRE) ×1 IMPLANT
INQWIRE 1.5J .035X260CM (WIRE) ×2
PACK CARDIAC CATH (CUSTOM PROCEDURE TRAY) ×2 IMPLANT
PROTECTION STATION PRESSURIZED (MISCELLANEOUS) ×2
SET ATX-X65L (MISCELLANEOUS) ×1 IMPLANT
STATION PROTECTION PRESSURIZED (MISCELLANEOUS) ×1 IMPLANT

## 2023-02-20 NOTE — Interval H&P Note (Signed)
History and Physical Interval Note:  02/20/2023 10:14 AM  James Yates  has presented today for surgery, with the diagnosis of L Cath    CAD.  The various methods of treatment have been discussed with the patient and family. After consideration of risks, benefits and other options for treatment, the patient has consented to  Procedure(s): LEFT HEART CATH AND CORONARY ANGIOGRAPHY (Left) as a surgical intervention.  The patient's history has been reviewed, patient examined, no change in status, stable for surgery.  I have reviewed the patient's chart and labs.  Questions were answered to the patient's satisfaction.     Lorine Bears

## 2023-02-21 ENCOUNTER — Telehealth: Payer: Self-pay | Admitting: Emergency Medicine

## 2023-02-21 ENCOUNTER — Encounter: Payer: Self-pay | Admitting: Cardiovascular Disease

## 2023-02-21 NOTE — Telephone Encounter (Signed)
Call backs on 9/18 and 9/19 unsuccessful contact  Call back today to daughter and still uncertain of meds being taken, pt was in cath lab yesterday, daughter unsure of results, sent MyChart invitation to make it easier for daughter to encourage pt to follow provider's recommendations

## 2023-03-07 ENCOUNTER — Ambulatory Visit
Payer: Medicare Other | Attending: Student in an Organized Health Care Education/Training Program | Admitting: Student in an Organized Health Care Education/Training Program

## 2023-03-07 ENCOUNTER — Encounter: Payer: Self-pay | Admitting: Student in an Organized Health Care Education/Training Program

## 2023-03-07 VITALS — BP 152/87 | HR 91 | Temp 97.5°F | Ht 65.0 in | Wt 191.0 lb

## 2023-03-07 DIAGNOSIS — Z951 Presence of aortocoronary bypass graft: Secondary | ICD-10-CM | POA: Insufficient documentation

## 2023-03-07 DIAGNOSIS — F32A Depression, unspecified: Secondary | ICD-10-CM | POA: Insufficient documentation

## 2023-03-07 DIAGNOSIS — Z952 Presence of prosthetic heart valve: Secondary | ICD-10-CM | POA: Diagnosis present

## 2023-03-07 DIAGNOSIS — M47816 Spondylosis without myelopathy or radiculopathy, lumbar region: Secondary | ICD-10-CM | POA: Diagnosis not present

## 2023-03-07 DIAGNOSIS — G8929 Other chronic pain: Secondary | ICD-10-CM | POA: Diagnosis present

## 2023-03-07 DIAGNOSIS — F419 Anxiety disorder, unspecified: Secondary | ICD-10-CM | POA: Diagnosis not present

## 2023-03-07 DIAGNOSIS — M5416 Radiculopathy, lumbar region: Secondary | ICD-10-CM | POA: Insufficient documentation

## 2023-03-07 MED ORDER — GABAPENTIN 300 MG PO CAPS
ORAL_CAPSULE | ORAL | 0 refills | Status: DC
Start: 2023-03-07 — End: 2023-04-10

## 2023-03-07 NOTE — Progress Notes (Signed)
Safety precautions to be maintained throughout the outpatient stay will include: orient to surroundings, keep bed in low position, maintain call bell within reach at all times, provide assistance with transfer out of bed and ambulation.  

## 2023-03-07 NOTE — Progress Notes (Signed)
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                  Functional ROM: Unrestricted ROM                  Muscle Tone/Strength: Functionally intact. No obvious neuro-muscular anomalies detected.  Muscle Tone/Strength: Functionally intact. No obvious neuro-muscular anomalies detected.  Sensory (Neurological): Neurogenic pain pattern right L5        Sensory (Neurological): Unimpaired        DTR: Patellar: deferred today Achilles: deferred today Plantar: deferred today  DTR: Patellar: deferred today Achilles: deferred today Plantar: deferred today  Palpation: No palpable anomalies  Palpation: No palpable anomalies    Assessment  Primary Diagnosis & Pertinent Problem List: The primary encounter diagnosis was  Chronic radicular lumbar pain. Diagnoses of Lumbar radiculopathy, Lumbar facet arthropathy, Lumbar spondylosis, Anxiety and depression, S/P CABG x 4, and S/P TAVR (transcatheter aortic valve replacement) were also pertinent to this visit.  Visit Diagnosis (New problems to examiner): 1. Chronic radicular lumbar pain   2. Lumbar radiculopathy   3. Lumbar facet arthropathy   4. Lumbar spondylosis   5. Anxiety and depression   6. S/P CABG x 4   7. S/P TAVR (transcatheter aortic valve replacement)    Plan of Care      Procedure  Orders         Lumbar Transforaminal Epidural     Pharmacotherapy (current): Medications ordered:  Meds ordered this encounter  Medications   gabapentin (NEURONTIN) 300 MG capsule    Sig: Take 1 capsule (300 mg total) by mouth at bedtime for 15 days, THEN 1 capsule (300 mg total) 2 (two) times daily.    Dispense:  105 capsule    Refill:  0   Medications administered during this visit: [name] had no medications administered during this visit.    Provider-requested follow-up: Return in about 2 weeks (around 03/21/2023) for Right L5 TF ESI, in clinic NS.  Future Appointments  Date Time Provider Department Center  03/16/2023  3:10 PM Sondra Barges, PA-C [facility] None  03/22/2023  9:20 AM Edward Jolly, MD [facility] None  04/10/2023 10:30 AM Drake Leach, PA-C [facility] None  05/09/2023 10:20 AM Glori Luis, MD [facility]    Duration of encounter: 60 minutes.  Total time on encounter, as per AMA guidelines included both the face-to-face and non-face-to-face time personally spent by the physician and/or other qualified health care professional(s) on the day of the encounter (includes time in activities that require the physician or other qualified health care professional and does not include time in activities normally performed by clinical staff). Physician's time may include the following activities when performed: Preparing to see the  patient (e.g., pre-charting review of records, searching for previously ordered imaging, lab work, and nerve conduction tests) Review of prior analgesic pharmacotherapies. Reviewing PMP Interpreting ordered tests (e.g., lab work, imaging, nerve conduction tests) Performing post-procedure evaluations, including interpretation of diagnostic procedures Obtaining and/or reviewing separately obtained history Performing a medically appropriate examination and/or evaluation Counseling and educating the patient/family/caregiver Ordering medications, tests, or procedures Referring and communicating with other health care professionals (when not separately reported) Documenting clinical information in the electronic or other health record Independently interpreting results (not separately reported) and communicating results to the patient/ family/caregiver Care coordination (not separately reported)  Note by: Edward Jolly, MD (TTS technology used. I apologize for any typographical errors that were not detected and corrected.) Date: 03/07/2023; Time: 9:29 AM

## 2023-03-15 NOTE — Progress Notes (Unsigned)
Cardiology Office Note    Date:  03/16/2023   ID:  James Yates, DOB [birth year], MRN [id number]  PCP:  Glori Luis, MD  Cardiologist:  Lorine Bears, MD  Electrophysiologist:  None   Chief Complaint: LHC follow-up  History of Present Illness:   James Yates is a 67 y.o. male with history of CAD s/p CABG in 06/2013 following a NSTEMI with chronic angina, bicuspid aortic valve with severe stenosis s/p TAVR in 02/2018, paroxysmal SVT, HTN, HLD, and RBBB who presents for follow up of LHC.   LHC in 10/2015 showed severe underlying 3-vessel CAD with a patent LIMA-LAD, and SVG-RCA. The SVG-LCx was occluded. The native LCx was occluded proximally with faint collaterals. There was moderate aortic stenosis with a peak to peak gradient of 22 mmHg. LHC was reviewed with our CTO team with the opinion being the LCx was not optimal for PCI. Echo in 09/2017 showed an EF of 60-65%, normal wall motion, progression of his aortic valve stenosis to severe with a peak velocity of 422 cm/s, mean gradient of 38 mmHg, and apeak gradient of 71 mmHg, left atrium was normal in size, RVSF normal, PASP 46 mmHg. He was seen on 11/23/2017 with worsening exertional chest tightness. He underwent R/LHC on 11/27/2017 that showed significant underlying 3-vessel CAD with a patent LIMA to LAD (could not be selectively engaged), patent SVG to RCA and known occluded SVG to OM3 along with an occluded LCx. There was moderate to severe aortic stenosis with a mean gradient of 26 mmHg and a valve area of 1.2. However, his aortic stenosis was felt to be likely severe based on echo findings and his valve area noted on 11/27/2017 was felt to be overestimated due to high cardiac output. Valve area of echo noted to be 0.6. The aortic valve morphology was also consistent with severe aortic stenosis. RHC showed mildly elevated filling pressures with mild pulmonary hypertension. He subsequently underwent TAVR in 02/2018. Echo from 02/2019  showed an EF of 60-65%, normal RVSF and ventricular cavity size, normal PASP, and a normal functioning bioprosthetic aortic valve with a mean gradient of 10 mmHg.  He underwent Zio in 2021 patch monitoring which showed a predominant rhythm of sinus with an average heart rate of 89 bpm, IVCD was noted, 10 episodes of SVT with the longest episode lasting 16 beats with an average heart rate of 152 bpm, rare PACs and PVCs.  Some patient triggered events correlated with PVCs and sinus tachycardia.  Echo in 03/2020 showed an EF of 50 to 55%, no regional wall motion abnormalities, normal LV diastolic function parameters, normal RV systolic function and ventricular cavity size, and normal structure and function of aortic valve prosthesis.     He was seen in the office in 05/2021 and had lost his health insurance briefly.  In this setting, he had ran out of medications.  Given this, he was not taking his antihypertensive medications and reported worsening chest pain and shortness of breath.  Symptoms were felt to be in the setting of being off antianginal medications.  At time of his visit, he was back under health insurance.   He was seen on 02/02/2022 continuing to note some exertional chest discomfort that was consistent what he was feeling at his visit in early 2023.  He also continued to note unchanged and longstanding palpitations.  Lexiscan MPI on 02/10/2022 was overall low risk with no significant ischemia with a small region of fixed defect in the apical, distal  anterior apical, and inferior apical region and an EF of 46%.  CT attenuated corrected images showed a mild aortic atherosclerosis, moderate coronary artery calcification, and TAVR valve.   In follow up on 04/11/2022, he noted stable chest discomfort.  Echo on 04/18/2022 demonstrated an EF of 55 to 60%, no regional wall motion abnormalities, indeterminate LV diastolic function parameters, low normal RV systolic function with normal ventricular cavity size,  normal structure and function of aortic valve prosthesis, and an estimated right atrial pressure of 3 mmHg.   He was seen in 04/2022 with shoulder pain, headache, and intermittent bleeding from the gums.  Amlodipine was discontinued to see if this would help with his gingival bleeding.  He was prescribed ranolazine, though could not afford the medication.  He was started on Imdur, though this worsened his headache.  He was seen in the office in 07/2022, and he continued to report stable exertional chest pain and shortness of breath with overexertion.  Symptoms were felt to be likely due to occluded LCx and graft to OM 3.  Imdur was discontinued due to headache.  Losartan was titrated to 100 mg daily with continuation of metoprolol.  Rosuvastatin was titrated to 40 mg daily.   He was seen in the office on 01/17/2023 and reported his chest discomfort was improved from prior, though did continue to have some exertional shortness of breath and chest discomfort.  Discontinuation of Imdur did not improve headache.  Amlodipine was titrated to 10 mg daily with continuation of Lopressor 100 mg twice daily.   He was seen in the ED on 01/24/2023 with multiple complaints including chest pressure lasting for several hours improved with nitroglycerin as well as headache, dizziness, and imbalance.  High-sensitivity troponin 29 with a delta troponin 26.  Chest x-ray without active cardiopulmonary disease.  CTA of the head and neck showed no emergent large vessel occlusion or hemodynamically significant stenosis of the head/neck with mild bilateral carotid bifurcation atherosclerosis without hemodynamically significant stenosis.  CTA chest showed no acute intrathoracic pathology or PE.  MRI of the brain was unrevealing.    He was most recently seen in the office in 01/2023 and continued to note intermittent chest discomfort that was largely unchanged compared to his prior visits.  He continued to have discomfort despite  improvement in blood pressure readings.  He also reported exertional shortness of breath and fatigue.  Given persistent symptoms, he underwent LHC on 02/20/2023 which showed significant underlying three-vessel CAD with patent LIMA to LAD and patent SVG to RPDA.  Chronically occluded proximal LCx with bridging collaterals and right to left collaterals with known chronically occluded SVG to OM was also noted.  No significant gradient across the TAVR prosthesis by pullback.  High normal LVEDP at 13 mmHg.  There was no significant change in coronary anatomy since most recent cardiac cath in 2019.  The LCx was well collateralized.  Medical therapy was recommended.  He comes in doing well from a cardiac perspective and is without symptoms of angina or cardiac decompensation.  He has not had any further chest pain since undergoing cardiac cath.  No dyspnea, palpitations, dizziness, presyncope, or syncope.  No falls, hematochezia, or melena.  No lower extremity swelling or progressive orthopnea.  He reports he has been taking rosuvastatin 40 mg every day despite most recent LDL significantly elevated at 162.  No left radial arteriotomy site complications.  Main complaint at this time is chronic back pain, for which she is scheduled to get  injections next week.   Labs independently reviewed: 01/2023 - TC 245, TG 226, HDL 41, LDL 162, BUN 15, serum creatinine 0.87, potassium 3.5, albumin 4.7, AST/ALT normal, Hgb 15.6, PLT 177 12/2022 - magnesium 2.2   Past Medical History:  Diagnosis Date   Coronary artery disease    s/p CABG   Glucose intolerance (impaired glucose tolerance)    Headache    chronic, no red flag or atypical signs or symptoms   Hyperlipidemia    Hypertension    Obesity    S/P TAVR (transcatheter aortic valve replacement)    26 mm Edwards Sapien 3 transcatheter heart valve placed via percutaneous right transfemoral approach    Severe aortic stenosis     Past Surgical History:  Procedure  Laterality Date   CARDIAC CATHETERIZATION  06/2013   [facility]   CARDIAC CATHETERIZATION N/A 10/29/2015   Procedure: Left Heart Cath and Cors/Grafts Angiography;  Surgeon: Iran Ouch, MD;  Location: [facility] INVASIVE CV LAB;  Service: Cardiovascular;  Laterality: N/A;   CORONARY ARTERY BYPASS GRAFT N/A 07/05/2013   Procedure: CORONARY ARTERY BYPASS GRAFTING (CABG);  Surgeon: [name], MD;  Location: [facility] OR;  Service: Open Heart Surgery;  Laterality: N/A;   INTRAOPERATIVE TRANSESOPHAGEAL ECHOCARDIOGRAM N/A 07/05/2013   Procedure: INTRAOPERATIVE TRANSESOPHAGEAL ECHOCARDIOGRAM;  Surgeon: [name], MD;  Location: [facility] OR;  Service: Open Heart Surgery;  Laterality: N/A;   INTRAOPERATIVE TRANSTHORACIC ECHOCARDIOGRAM N/A 03/13/2018   Procedure: INTRAOPERATIVE TRANSTHORACIC ECHOCARDIOGRAM;  Surgeon: Kathleene Hazel, MD;  Location: [facility] OR;  Service: Open Heart Surgery;  Laterality: N/A;   LEFT HEART CATH AND CORS/GRAFTS ANGIOGRAPHY N/A 02/20/2023   Procedure: LEFT HEART CATH AND CORS/GRAFTS ANGIOGRAPHY;  Surgeon: Iran Ouch, MD;  Location: [facility] INVASIVE CV LAB;  Service: Cardiovascular;  Laterality: N/A;   NO PAST SURGERIES     RIGHT/LEFT HEART CATH AND CORONARY ANGIOGRAPHY Bilateral 11/27/2017   Procedure: RIGHT/LEFT HEART CATH AND CORONARY ANGIOGRAPHY;  Surgeon: Iran Ouch, MD;  Location: [facility] INVASIVE CV LAB;  Service: Cardiovascular;  Laterality: Bilateral;   TRANSCATHETER AORTIC VALVE REPLACEMENT, TRANSFEMORAL N/A 03/13/2018   Procedure: TRANSCATHETER AORTIC VALVE REPLACEMENT, TRANSFEMORAL. Edwards Sapien 3 Transcatheter Heart Valve size 26mm.;  Surgeon: Kathleene Hazel, MD;  Location: [facility] OR;  Service: Open Heart Surgery;  Laterality: N/A;    Current Medications: Current Meds  Medication Sig   amLODipine (NORVASC) 10 MG tablet Take 1 tablet (10 mg total) by mouth daily.   aspirin EC 81 MG tablet Take 1 tablet (81 mg total) by mouth daily.   ezetimibe (ZETIA) 10 MG  tablet Take 1 tablet (10 mg total) by mouth daily.   gabapentin (NEURONTIN) 300 MG capsule Take 1 capsule (300 mg total) by mouth at bedtime for 15 days, THEN 1 capsule (300 mg total) 2 (two) times daily.   losartan (COZAAR) 100 MG tablet Take 1 tablet (100 mg total) by mouth daily.   meclizine (ANTIVERT) 25 MG tablet Take 1 tablet (25 mg total) by mouth 3 (three) times daily as needed for dizziness.   metoprolol tartrate (LOPRESSOR) 100 MG tablet Take 1 tablet (100 mg total) by mouth 2 (two) times daily.   pantoprazole (PROTONIX) 40 MG tablet TAKE 1 TABLET BY MOUTH TWO TIMES DAILY BEFORE A MEAL FOR 30 DAYS, THEN 1 TABLET BY MOUTH DAILY BEFORE BREAKFAST   rosuvastatin (CRESTOR) 40 MG tablet Take 1 tablet (40 mg total) by mouth daily.   sertraline (ZOLOFT) 100 MG tablet Take 1 tablet (100 mg  total) by mouth daily.    Allergies:   Patient has no known allergies.   Social History   Socioeconomic History   Marital status: Married    Spouse name: Aurelia   Number of children: 3   Years of education: Not on file   Highest education level: Not on file  Occupational History   Not on file  Tobacco Use   Smoking status: Never   Smokeless tobacco: Never  Vaping Use   Vaping status: Never Used  Substance and Sexual Activity   Alcohol use: No   Drug use: No   Sexual activity: Not on file  Other Topics Concern   Not on file  Social History Narrative   Lives with wife at home and son [place]    Social Determinants of Health   Financial Resource Strain: Not on file  Food Insecurity: Not on file  Transportation Needs: Not on file  Physical Activity: Not on file  Stress: Not on file  Social Connections: Not on file     Family History:  The patient's family history includes Heart attack in his father; Heart disease in his father.  ROS:   12-point review of systems is negative unless otherwise noted in the HPI.   EKGs/Labs/Other Studies Reviewed:    Studies reviewed were  summarized above. The additional studies were reviewed today:  LHC 02/20/2023:   Dist LAD lesion is 30% stenosed.   Ost LAD to Mid LAD lesion is 80% stenosed.   Prox Cx to Mid Cx lesion is 100% stenosed.   Prox RCA lesion is 30% stenosed.   Mid RCA lesion is 100% stenosed.   Prox Graft lesion before Ramus  is 100% stenosed.   Ost 1st Diag to 1st Diag lesion is 70% stenosed.   Ost 2nd Diag to 2nd Diag lesion is 100% stenosed.   Origin to Prox Graft lesion is 100% stenosed.   SVG graft was visualized by angiography and is normal in caliber.   LIMA graft was visualized by angiography and is normal in caliber.   The graft exhibits no disease.   The graft exhibits no disease.   1.  Significant underlying three-vessel coronary artery disease with patent LIMA to LAD and patent SVG to right PDA.  Chronically occluded proximal left circumflex with bridging collaterals and right to left collaterals with known chronically occluded SVG to OM. 2.  No significant gradient across the TAVR prosthesis by pullback. 3.  Left ventricular angiography was not performed.  EF was normal by echo.  High normal LVEDP at 13 mmHg.   Recommendations: No significant change in coronary anatomy since most recent cardiac catheterization.  The left circumflex is well collateralized.  Recommend continuing medical therapy. __________  2D echo 04/18/2022: 1. Left ventricular ejection fraction, by estimation, is 55 to 60%. The  left ventricle has normal function. The left ventricle has no regional  wall motion abnormalities. Left ventricular diastolic parameters are  indeterminate. The average left  ventricular global longitudinal strain is -22.8 %. The global longitudinal  strain is normal.   2. Right ventricular systolic function is low normal. The right  ventricular size is normal.   3. The mitral valve is normal in structure. No evidence of mitral valve  regurgitation.   4. The aortic valve has been  repaired/replaced. Aortic valve  regurgitation is not visualized. Echo findings are consistent with normal  structure and function of the aortic valve prosthesis. Aortic valve mean  gradient measures 11.0 mmHg.  5. The inferior vena cava is normal in size with greater than 50%  respiratory variability, suggesting right atrial pressure of 3 mmHg.   Comparison(s): Echocardiogram done 04/16/20 showed an EF of 50-55%.  __________   Eugenie Birks MPI 02/10/2022: Pharmacological myocardial perfusion imaging study with no significant  ischemia Small region fixed defect in the apical, distal anterior apical, inferior apical region, unable to exclude small region prior MI Normal wall motion, EF estimated at 46% No EKG changes concerning for ischemia at peak stress or in recovery. CT attenuation correction images with mild aortic atherosclerosis and moderate coronary calcification, TAVR valve noted Low risk scan __________   Zio patch 03/2020: Normal sinus rhythm with an average heart rate 89 bpm.  IVCD. 10 episodes of supraventricular tachycardia.  The longest lasted 16 beats with an average heart rate of 152 bpm. Rare PACs and rare PVCs. Some triggered events correlated with PVCs and sinus tachycardia. __________   2D echo 03/2020: 1. Left ventricular ejection fraction, by estimation, is 50 to 55%. Left  ventricular ejection fraction by 3D volume is 53 %. The left ventricle has  low normal function. The left ventricle has no regional wall motion  abnormalities. Left ventricular  diastolic parameters were normal.   2. Right ventricular systolic function is normal. The right ventricular  size is normal.   3. The mitral valve is normal in structure. No evidence of mitral valve  regurgitation.   4. The aortic valve has been repaired/replaced. Aortic valve  regurgitation is not visualized. Echo findings are consistent with normal  structure and function of the aortic valve  prosthesis. ___________   See Epic for remaining cardiac studies   EKG:  EKG is ordered today.  The EKG ordered today demonstrates NSR, 72 bpm, RBBB, consistent with prior tracing  Recent Labs: 01/24/2023: Magnesium 2.2 02/14/2023: ALT 19; BUN 15; Creatinine, Ser 0.87; Hemoglobin 15.6; Platelets 177; Potassium 3.5; Sodium 146  Recent Lipid Panel    Component Value Date/Time   CHOL 245 (H) 02/14/2023 1206   CHOL 215 (H) 07/02/2013 0058   TRIG 226 (H) 02/14/2023 1206   TRIG 143 07/02/2013 0058   HDL 41 02/14/2023 1206   HDL 38 (L) 07/02/2013 0058   CHOLHDL 6.0 (H) 02/14/2023 1206   CHOLHDL 5.3 04/11/2022 1057   VLDL 55 (H) 04/11/2022 1057   VLDL 29 07/02/2013 0058   LDLCALC 162 (H) 02/14/2023 1206   LDLCALC 148 (H) 07/02/2013 0058    PHYSICAL EXAM:    VS:  BP 126/74 (BP Location: Left Arm, Patient Position: Sitting, Cuff Size: Normal)   Pulse 72   Ht 5\' 5"  (1.651 m)   Wt 196 lb 9.6 oz (89.2 kg)   SpO2 96%   BMI 32.72 kg/m   BMI: Body mass index is 32.72 kg/m.  Physical Exam Vitals reviewed.  Constitutional:      Appearance: He is well-developed.  HENT:     Head: Normocephalic and atraumatic.  Eyes:     General:        Right eye: No discharge.        Left eye: No discharge.  Neck:     Vascular: No JVD.  Cardiovascular:     Rate and Rhythm: Normal rate and regular rhythm.     Heart sounds: S1 normal and S2 normal. Heart sounds not distant. No midsystolic click and no opening snap. Murmur heard.     Systolic murmur is present with a grade of 2/6 at the upper right sternal  border.     No friction rub.     Comments: Left radial arteriotomy site is well-healed without active bleeding, bruising, swelling, warmth, erythema, or tenderness to palpation.  Radial pulse 2+ proximal and distal to the arteriotomy site. Pulmonary:     Effort: Pulmonary effort is normal. No respiratory distress.     Breath sounds: Normal breath sounds. No decreased breath sounds, wheezing,  rhonchi or rales.  Chest:     Chest wall: No tenderness.  Abdominal:     General: There is no distension.  Musculoskeletal:     Cervical back: Normal range of motion.     Right lower leg: No edema.     Left lower leg: No edema.  Skin:    General: Skin is warm and dry.     Nails: There is no clubbing.  Neurological:     Mental Status: He is alert and oriented to person, place, and time.  Psychiatric:        Speech: Speech normal.        Behavior: Behavior normal.        Thought Content: Thought content normal.        Judgment: Judgment normal.     Wt Readings from Last 3 Encounters:  03/16/23 196 lb 9.6 oz (89.2 kg)  03/07/23 191 lb (86.6 kg)  02/20/23 191 lb (86.6 kg)     ASSESSMENT & PLAN:   CAD status post CABG with stable angina: In the setting of ongoing angina and he recently underwent diagnostic LHC on 02/20/2023 that showed significant underlying three-vessel CAD with patent LIMA to LAD and patent SVG to RPDA.  Chronically occluded proximal LCx with bridging collaterals and right to left collaterals with known chronically occluded SVG to OM.  There was no significant change in coronary anatomy since a cardiac cath in 2019 with recommendation to continue medical therapy.  Recommend aggressive risk factor modification prevention with continuation of aspirin, amlodipine, low tartrate, and rosuvastatin.  Not currently on Imdur out of concern for this exacerbating his chronic headache.  Not on ranolazine and due to financial constraints.  No indication for further ischemic testing at this time.  Severe aortic stenosis status post TAVR: Recent echo less than 12 months prior showed normal prosthetic valve structure and function.  Remains on aspirin.  SBE prophylaxis indicated for dental procedures.  HTN: Blood pressure is well-controlled today.  Query if elevations in blood pressure are contributing to some of his intermittent angina.  Remains on amlodipine, losartan, and Lopressor.   Low-sodium diet recommended.  HLD: LDL 162 in 01/2023 with target LDL less than 70.  Reports adherence to rosuvastatin 40 mg.  Add ezetimibe 10 mg daily.  Follow-up fasting lipid panel and LFT in 3 months.  If LDL remains above goal of less than 70 we will need to consider PCSK9 inhibitor, if not cost prohibitive.  Language barrier: [facility] medical interpreter utilized for today's visit.    Disposition: F/u with Dr. Kirke Corin or an APP in 3 months.   Medication Adjustments/Labs and Tests Ordered: Current medicines are reviewed at length with the patient today.  Concerns regarding medicines are outlined above. Medication changes, Labs and Tests ordered today are summarized above and listed in the Patient Instructions accessible in Encounters.   Signed, Eula Listen, PA-C 03/16/2023 4:50 PM     [facility] [place] [address] [place], [place] [address] [phone]

## 2023-03-16 ENCOUNTER — Encounter: Payer: Self-pay | Admitting: Physician Assistant

## 2023-03-16 ENCOUNTER — Ambulatory Visit: Payer: Medicare Other | Attending: Physician Assistant | Admitting: Physician Assistant

## 2023-03-16 VITALS — BP 126/74 | HR 72 | Ht 65.0 in | Wt 196.6 lb

## 2023-03-16 DIAGNOSIS — Z952 Presence of prosthetic heart valve: Secondary | ICD-10-CM | POA: Insufficient documentation

## 2023-03-16 DIAGNOSIS — Z603 Acculturation difficulty: Secondary | ICD-10-CM | POA: Diagnosis present

## 2023-03-16 DIAGNOSIS — Z951 Presence of aortocoronary bypass graft: Secondary | ICD-10-CM | POA: Insufficient documentation

## 2023-03-16 DIAGNOSIS — E785 Hyperlipidemia, unspecified: Secondary | ICD-10-CM | POA: Diagnosis present

## 2023-03-16 DIAGNOSIS — I350 Nonrheumatic aortic (valve) stenosis: Secondary | ICD-10-CM | POA: Insufficient documentation

## 2023-03-16 DIAGNOSIS — Z758 Other problems related to medical facilities and other health care: Secondary | ICD-10-CM | POA: Diagnosis present

## 2023-03-16 DIAGNOSIS — I25118 Atherosclerotic heart disease of native coronary artery with other forms of angina pectoris: Secondary | ICD-10-CM | POA: Insufficient documentation

## 2023-03-16 DIAGNOSIS — I10 Essential (primary) hypertension: Secondary | ICD-10-CM | POA: Insufficient documentation

## 2023-03-16 MED ORDER — EZETIMIBE 10 MG PO TABS
10.0000 mg | ORAL_TABLET | Freq: Every day | ORAL | 3 refills | Status: DC
Start: 2023-03-16 — End: 2023-05-30

## 2023-03-16 NOTE — Patient Instructions (Signed)
Medication Instructions:  Your physician has recommended you make the following change in your medication:   START Ezetimibe 10 mg once daily   *If you need a refill on your cardiac medications before your next appointment, please call your pharmacy*   Lab Work: None  If you have labs (blood work) drawn today and your tests are completely normal, you will receive your results only by: MyChart Message (if you have MyChart) OR A paper copy in the mail If you have any lab test that is abnormal or we need to change your treatment, we will call you to review the results.   Testing/Procedures: None   Follow-Up: At [facility], you and your health needs are our priority.  As part of our continuing mission to provide you with exceptional heart care, we have created designated Provider Care Teams.  These Care Teams include your primary Cardiologist (physician) and Advanced Practice Providers (APPs -  Physician Assistants and Nurse Practitioners) who all work together to provide you with the care you need, when you need it.  We recommend signing up for the patient portal called "MyChart".  Sign up information is provided on this After Visit Summary.  MyChart is used to connect with patients for Virtual Visits (Telemedicine).  Patients are able to view lab/test results, encounter notes, upcoming appointments, etc.  Non-urgent messages can be sent to your provider as well.   To learn more about what you can do with MyChart, go to [email or web address].    Your next appointment:   3 month(s)  Provider:   Lorine Bears, MD or Eula Listen, PA-C

## 2023-03-17 ENCOUNTER — Ambulatory Visit: Payer: Medicare Other | Admitting: Physician Assistant

## 2023-03-22 ENCOUNTER — Encounter: Payer: Self-pay | Admitting: Student in an Organized Health Care Education/Training Program

## 2023-03-22 ENCOUNTER — Ambulatory Visit
Admission: RE | Admit: 2023-03-22 | Discharge: 2023-03-22 | Disposition: A | Discharge status: Home / Self Care | Payer: Medicare Other | Source: Ambulatory Visit | Attending: Student in an Organized Health Care Education/Training Program | Admitting: Student in an Organized Health Care Education/Training Program

## 2023-03-22 ENCOUNTER — Ambulatory Visit
Payer: Medicare Other | Attending: Student in an Organized Health Care Education/Training Program | Admitting: Student in an Organized Health Care Education/Training Program

## 2023-03-22 VITALS — BP 137/95 | HR 73 | Temp 98.1°F | Resp 13 | Ht 65.0 in | Wt 196.0 lb

## 2023-03-22 DIAGNOSIS — G8929 Other chronic pain: Secondary | ICD-10-CM | POA: Diagnosis not present

## 2023-03-22 DIAGNOSIS — M5416 Radiculopathy, lumbar region: Secondary | ICD-10-CM | POA: Diagnosis present

## 2023-03-22 MED ORDER — ROPIVACAINE HCL 2 MG/ML IJ SOLN
1.0000 mL | Freq: Once | INTRAMUSCULAR | Status: AC
  Administered 2023-03-22: 1 mL via EPIDURAL
  Filled 2023-03-22: qty 20

## 2023-03-22 MED ORDER — SODIUM CHLORIDE 0.9% FLUSH
1.0000 mL | Freq: Once | INTRAVENOUS | Status: AC
  Administered 2023-03-22: 1 mL

## 2023-03-22 MED ORDER — LIDOCAINE HCL 2 % IJ SOLN
20.0000 mL | Freq: Once | INTRAMUSCULAR | Status: AC
  Administered 2023-03-22: 400 mg
  Filled 2023-03-22: qty 20

## 2023-03-22 MED ORDER — SODIUM CHLORIDE (PF) 0.9 % IJ SOLN
INTRAMUSCULAR | Status: AC
  Filled 2023-03-22: qty 10

## 2023-03-22 MED ORDER — DEXAMETHASONE SODIUM PHOSPHATE 10 MG/ML IJ SOLN
INTRAMUSCULAR | Status: AC
  Filled 2023-03-22: qty 1

## 2023-03-22 MED ORDER — DEXAMETHASONE SODIUM PHOSPHATE 10 MG/ML IJ SOLN
10.0000 mg | Freq: Once | INTRAMUSCULAR | Status: AC
  Administered 2023-03-22: 10 mg

## 2023-03-22 MED ORDER — DEXAMETHASONE SODIUM PHOSPHATE 10 MG/ML IJ SOLN
10.0000 mg | Freq: Once | INTRAMUSCULAR | Status: AC
  Administered 2023-03-22: 10 mg
  Filled 2023-03-22: qty 1

## 2023-03-22 MED ORDER — IOHEXOL 180 MG/ML  SOLN
10.0000 mL | Freq: Once | INTRAMUSCULAR | Status: AC
  Administered 2023-03-22: 10 mL via EPIDURAL
  Filled 2023-03-22: qty 20

## 2023-03-22 NOTE — Progress Notes (Signed)
PROVIDER NOTE: Interpretation of information contained herein should be left to medically-trained personnel. Specific patient instructions are provided elsewhere under "Patient Instructions" section of medical record. This document was created in part using STT-dictation technology, any transcriptional errors that may result from this process are unintentional.  Patient: James Yates Type: Established DOB: Jun 12, 1955 MRN: [id number] PCP: Glori Luis, MD  Service: Procedure DOS: 03/22/2023 Setting: Ambulatory Location: Ambulatory outpatient facility Delivery: Face-to-face Provider: Edward Jolly, MD Specialty: Interventional Pain Management Specialty designation: 09 Location: Outpatient facility Ref. Prov.: Glori Luis, MD       Interventional Therapy   Procedure: Lumbar trans-foraminal epidural steroid injection (L-TFESI) #1  Laterality: Bilateral (-50)  Level: L5 nerve root(s) Imaging: Fluoroscopy-guided         Anesthesia: Local anesthesia (1-2% Lidocaine) Sedation: No Sedation                       DOS: 03/22/2023  Performed by: Edward Jolly, MD  Purpose: Diagnostic/Therapeutic Indications: Lumbar radicular pain severe enough to impact quality of life or function. 1. Chronic radicular lumbar pain   2. Lumbar radiculopathy    NAS-11 Pain score:   Pre-procedure: 7 /10   Post-procedure: 5 /10     Position / Prep / Materials:  Position: Prone  Prep solution: ChloraPrep (2% chlorhexidine gluconate and 70% isopropyl alcohol) Prep Area: Entire Posterior Lumbosacral Area.  From the lower tip of the scapula down to the tailbone and from flank to flank. Materials:  Tray: Block Needle(s):  Type: Spinal  Gauge (G): 22  Length: 5-in  Qty: 2      H&P (Pre-op Assessment):  James Yates is a 67 y.o. (year old), male patient, seen today for interventional treatment. 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 James Yates has a current medication list which includes the following prescription(s): amlodipine, aspirin ec, ezetimibe, gabapentin, losartan, meclizine, metoprolol tartrate, pantoprazole, rosuvastatin, sertraline, nitroglycerin, and terbinafine, and the following Facility-Administered Medications: dexamethasone. His primarily concern today is the Back Pain (Lumbar right L5)  Initial Vital Signs:  Pulse/HCG Rate: 73ECG Heart Rate: 76 Temp: 98.1 F (36.7 C) Resp: 16 BP: (!) 141/90 SpO2: 99 %  BMI: Estimated body mass index is 32.62 kg/m as calculated from the following:   Height as of this encounter: 5\' 5"  (1.651 m).   Weight as of this encounter: 196 lb (88.9 kg).  Risk Assessment: Allergies: Reviewed. He has No Known Allergies.  Allergy Precautions: None required Coagulopathies: Reviewed. None identified.  Blood-thinner therapy: None at this time Active Infection(s): Reviewed. None identified. James Yates is afebrile  Site Confirmation: James Yates was asked to confirm the procedure and laterality before marking the site Procedure checklist: Completed Consent: Before the procedure and under the influence of no sedative(s), amnesic(s), or anxiolytics, the patient was informed of the treatment options, risks and possible complications. To fulfill our ethical and legal obligations, as recommended by the American Medical Association's Code of Ethics, I have informed the patient of my clinical impression; the nature and purpose of the treatment or procedure; the risks,  benefits, and possible complications of the intervention; the alternatives, including doing nothing; the risk(s) and benefit(s) of the  alternative treatment(s) or procedure(s); and the risk(s) and benefit(s) of doing nothing. The patient was provided information about the general risks and possible complications associated with the procedure. These may include, but are not limited to: failure to achieve desired goals, infection, bleeding, organ or nerve damage, allergic reactions, paralysis, and death. In addition, the patient was informed of those risks and complications associated to Spine-related procedures, such as failure to decrease pain; infection (i.e.: Meningitis, epidural or intraspinal abscess); bleeding (i.e.: epidural hematoma, subarachnoid hemorrhage, or any other type of intraspinal or peri-dural bleeding); organ or nerve damage (i.e.: Any type of peripheral nerve, nerve root, or spinal cord injury) with subsequent damage to sensory, motor, and/or autonomic systems, resulting in permanent pain, numbness, and/or weakness of one or several areas of the body; allergic reactions; (i.e.: anaphylactic reaction); and/or death. Furthermore, the patient was informed of those risks and complications associated with the medications. These include, but are not limited to: allergic reactions (i.e.: anaphylactic or anaphylactoid reaction(s)); adrenal axis suppression; blood sugar elevation that in diabetics may result in ketoacidosis or comma; water retention that in patients with history of congestive heart failure may result in shortness of breath, pulmonary edema, and decompensation with resultant heart failure; weight gain; swelling or edema; medication-induced neural toxicity; particulate matter embolism and blood vessel occlusion with resultant organ, and/or nervous system infarction; and/or aseptic necrosis of one or more joints. Finally, the patient was informed that Medicine is not an exact science;  therefore, there is also the possibility of unforeseen or unpredictable risks and/or possible complications that may result in a catastrophic outcome. The patient indicated having understood very clearly. We have given the patient no guarantees and we have made no promises. Enough time was given to the patient to ask questions, all of which were answered to the patient's satisfaction. James Yates has indicated that he wanted to continue with the procedure. Attestation: I, the ordering provider, attest that I have discussed with the patient the benefits, risks, side-effects, alternatives, likelihood of achieving goals, and potential problems during recovery for the procedure that I have provided informed consent. Date  Time: 03/22/2023  8:52 AM   Pre-Procedure Preparation:  Monitoring: As per clinic protocol. Respiration, ETCO2, SpO2, BP, heart rate and rhythm monitor placed and checked for adequate function Safety Precautions: Patient was assessed for positional comfort and pressure points before starting the procedure. Time-out: I initiated and conducted the "Time-out" before starting the procedure, as per protocol. The patient was asked to participate by confirming the accuracy of the "Time Out" information. Verification of the correct person, site, and procedure were performed and confirmed by me, the nursing staff, and the patient. "Time-out" conducted as per Joint Commission's Universal Protocol (UP.01.01.01). Time: 1002 Start Time: 1002 hrs.  Description/Narrative of Procedure:          Target: The 6 o'clock position under the pedicle, on the affected side. Region: Posterolateral Lumbosacral Approach: Posterior Percutaneous Paravertebral approach.  Rationale (medical necessity): procedure needed and proper for the diagnosis and/or treatment of the patient's medical symptoms and needs. Procedural Technique Safety Precautions: Aspiration looking for blood return was conducted prior to all  injections. At no point did we inject any substances, as a needle was being advanced. No attempts were made at seeking any paresthesias. Safe injection practices and needle disposal techniques used. Medications properly checked for expiration dates. SDV (single dose vial) medications used. Description of the Procedure: Protocol guidelines were followed. The patient was placed in position over the procedure  table. The target area was identified and the area prepped in the usual manner. Skin & deeper tissues infiltrated with local anesthetic. Appropriate amount of time allowed to pass for local anesthetics to take effect. The procedure needles were then advanced to the target area. Proper needle placement secured. Negative aspiration confirmed. Solution injected in intermittent fashion, asking for systemic symptoms every 0.5cc of injectate. The needles were then removed and the area cleansed, making sure to leave some of the prepping solution back to take advantage of its long term bactericidal properties.  5 cc solution made of 2 cc of preservative-free saline, 2 cc of 0.2% ropivacaine, 1 cc of Decadron 10 mg/cc. 2.5 cc injected for the Left L5 nerve, 2.5 cc injected for the right L5 nerve   Vitals:   03/22/23 0955 03/22/23 1000 03/22/23 1005 03/22/23 1009  BP: (!) 131/100 (!) 133/98 (!) 128/99 (!) 137/95  Pulse:      Resp: 10 (!) 8 13 13   Temp:      TempSrc:      SpO2: 96% 95% 96% 96%  Weight:      Height:        Start Time: 1002 hrs. End Time: 1008 hrs.  Imaging Guidance (Spinal):          Type of Imaging Technique: Fluoroscopy Guidance (Spinal) Indication(s): Assistance in needle guidance and placement for procedures requiring needle placement in or near specific anatomical locations not easily accessible without such assistance. Exposure Time: Please see nurses notes. Contrast: Before injecting any contrast, we confirmed that the patient did not have an allergy to iodine, shellfish, or  radiological contrast. Once satisfactory needle placement was completed at the desired level, radiological contrast was injected. Contrast injected under live fluoroscopy. No contrast complications. See chart for type and volume of contrast used. Fluoroscopic Guidance: I was personally present during the use of fluoroscopy. "Tunnel Vision Technique" used to obtain the best possible view of the target area. Parallax error corrected before commencing the procedure. "Direction-depth-direction" technique used to introduce the needle under continuous pulsed fluoroscopy. Once target was reached, antero-posterior, oblique, and lateral fluoroscopic projection used confirm needle placement in all planes. Images permanently stored in EMR. Interpretation: I personally interpreted the imaging intraoperatively. Adequate needle placement confirmed in multiple planes. Appropriate spread of contrast into desired area was observed. No evidence of afferent or efferent intravascular uptake. No intrathecal or subarachnoid spread observed. Permanent images saved into the patient's record.  Post-operative Assessment:  Post-procedure Vital Signs:  Pulse/HCG Rate: 7373 Temp: 98.1 F (36.7 C) Resp: 13 BP: (!) 137/95 SpO2: 96 %  EBL: None  Complications: No immediate post-treatment complications observed by team, or reported by patient.  Note: The patient tolerated the entire procedure well. A repeat set of vitals were taken after the procedure and the patient was kept under observation following institutional policy, for this type of procedure. Post-procedural neurological assessment was performed, showing return to baseline, prior to discharge. The patient was provided with post-procedure discharge instructions, including a section on how to identify potential problems. Should any problems arise concerning this procedure, the patient was given instructions to immediately contact us, at any time, without hesitation. In any  case, we plan to contact the patient by telephone for a follow-up status report regarding this interventional procedure.  Comments:  No additional relevant information.  Plan of Care (POC)  Orders:  Orders Placed This Encounter  Procedures   DG PAIN CLINIC C-ARM 1-60 MIN NO REPORT    Intraoperative  interpretation by procedural physician at [facility].    Standing Status:   Standing    Number of Occurrences:   1    Order Specific Question:   Reason for exam:    Answer:   Assistance in needle guidance and placement for procedures requiring needle placement in or near specific anatomical locations not easily accessible without such assistance.   Medications ordered for procedure: Meds ordered this encounter  Medications   iohexol (OMNIPAQUE) 180 MG/ML injection 10 mL    Must be Myelogram-compatible. If not available, you may substitute with a water-soluble, non-ionic, hypoallergenic, myelogram-compatible radiological contrast medium.   lidocaine (XYLOCAINE) 2 % (with pres) injection 400 mg   sodium chloride flush (NS) 0.9 % injection 1 mL   ropivacaine (PF) 2 mg/mL (0.2%) (NAROPIN) injection 1 mL   dexamethasone (DECADRON) injection 10 mg   dexamethasone (DECADRON) injection 10 mg   Medications administered: We administered iohexol, lidocaine, sodium chloride flush, ropivacaine (PF) 2 mg/mL (0.2%), and dexamethasone.  See the medical record for exact dosing, route, and time of administration.  Follow-up plan:   Return in about 4 weeks (around 04/19/2023) for PPE F2F.       B/L L5 TF ESI 03/22/23    Recent Visits Date Type Provider Dept  03/07/23 Office Visit Edward Jolly, MD [facility]  Showing recent visits within past 90 days and meeting all other requirements Today's Visits Date Type Provider Dept  03/22/23 Procedure visit Edward Jolly, MD [facility]  Showing today's visits and meeting all other requirements Future Appointments Date  Type Provider Dept  04/18/23 Appointment Edward Jolly, MD [facility]  Showing future appointments within next 90 days and meeting all other requirements  Disposition: Discharge home  Discharge (Date  Time): 03/22/2023; 1015 hrs.   Primary Care Physician: Glori Luis, MD Location: [facility] Note by: Edward Jolly, MD (TTS technology used. I apologize for any typographical errors that were not detected and corrected.) Date: 03/22/2023; Time: 11:26 AM  Disclaimer:  Medicine is not an Visual merchandiser. The only guarantee in medicine is that nothing is guaranteed. It is important to note that the decision to proceed with this intervention was based on the information collected from the patient. The Data and conclusions were drawn from the patient's questionnaire, the interview, and the physical examination. Because the information was provided in large part by the patient, it cannot be guaranteed that it has not been purposely or unconsciously manipulated. Every effort has been made to obtain as much relevant data as possible for this evaluation. It is important to note that the conclusions that lead to this procedure are derived in large part from the available data. Always take into account that the treatment will also be dependent on availability of resources and existing treatment guidelines, considered by other Pain Management Practitioners as being common knowledge and practice, at the time of the intervention. For Medico-Legal purposes, it is also important to point out that variation in procedural techniques and pharmacological choices are the acceptable norm. The indications, contraindications, technique, and results of the above procedure should only be interpreted and judged by a Board-Certified Interventional Pain Specialist with extensive familiarity and expertise in the same exact procedure and technique.

## 2023-03-22 NOTE — Progress Notes (Signed)
Safety precautions to be maintained throughout the outpatient stay will include: orient to surroundings, keep bed in low position, maintain call bell within reach at all times, provide assistance with transfer out of bed and ambulation.  

## 2023-03-22 NOTE — Patient Instructions (Signed)

## 2023-03-23 ENCOUNTER — Telehealth: Payer: Self-pay

## 2023-03-23 NOTE — Telephone Encounter (Signed)
Post procedure follow up.  Mailbox full, can't leave message,.

## 2023-04-07 NOTE — Progress Notes (Unsigned)
Referring Physician:  Glori Luis, MD [address] [place],  [place] [address]  Primary Physician:  Glori Luis, MD  Interpreter used as he speaks spanish.   History of Present Illness: [name] has a history of CAD, HTN, GERD, history of CABG, hyperlipidemia, obesity, s/p aortic valve replacement, hypercholesterolemia.   Last seen by me on 02/17/23 for chronic back and bilateral leg pain. He has known lumbar spondylosis with right paracentral disc L4-L5 with bilateral lateral recess stenosis. LBP is likely due to spondylosis. Leg pain may be from L4-L5.   He was sent to pain management and he was started on neurontin. He had bilateral L5 TF ESI 03/22/23.   He is here for follow up.   Injection gave him good relief (about 80%) but only for 4-5 days.   He has constant LBP with bilateral leg pain, right leg is lateral to calf, left leg is more lateral hip pain. Pain is worse with walking, prolonged sitting/driving. He has numbness and tingling with weakness in his legs.   He is taking his neurontin and thinks it helps.   Bowel/Bladder Dysfunction: none  He does not smoke.  Conservative measures:  Physical therapy: [facility] PT May to 11/23/22- does not look like he was discharged.  Multimodal medical therapy including regular antiinflammatories: advil, tylenol, neurontin Injections:  bilateral L5 TF ESI 03/22/23 he epidural steroid injections years ago   Past Surgery: no spinal surgery  James Yates has no symptoms of cervical myelopathy.  The symptoms are causing a significant impact on the patient's life.   Review of Systems:  A 10 point review of systems is negative, except for the pertinent positives and negatives detailed in the HPI.  Past Medical History: Past Medical History:  Diagnosis Date   Coronary artery disease    s/p CABG   Glucose intolerance (impaired glucose tolerance)    Headache    chronic, no red flag or  atypical signs or symptoms   Hyperlipidemia    Hypertension    Obesity    S/P TAVR (transcatheter aortic valve replacement)    26 mm Edwards Sapien 3 transcatheter heart valve placed via percutaneous right transfemoral approach    Severe aortic stenosis     Past Surgical History: Past Surgical History:  Procedure Laterality Date   CARDIAC CATHETERIZATION  06/2013   [facility]   CARDIAC CATHETERIZATION N/A 10/29/2015   Procedure: Left Heart Cath and Cors/Grafts Angiography;  Surgeon: Iran Ouch, MD;  Location: [facility];  Service: Cardiovascular;  Laterality: N/A;   CORONARY ARTERY BYPASS GRAFT N/A 07/05/2013   Procedure: CORONARY ARTERY BYPASS GRAFTING (CABG);  Surgeon: [name], MD;  Location: [facility] OR;  Service: Open Heart Surgery;  Laterality: N/A;   INTRAOPERATIVE TRANSESOPHAGEAL ECHOCARDIOGRAM N/A 07/05/2013   Procedure: INTRAOPERATIVE TRANSESOPHAGEAL ECHOCARDIOGRAM;  Surgeon: [name], MD;  Location: [facility] OR;  Service: Open Heart Surgery;  Laterality: N/A;   INTRAOPERATIVE TRANSTHORACIC ECHOCARDIOGRAM N/A 03/13/2018   Procedure: INTRAOPERATIVE TRANSTHORACIC ECHOCARDIOGRAM;  Surgeon: Kathleene Hazel, MD;  Location: [facility] OR;  Service: Open Heart Surgery;  Laterality: N/A;   LEFT HEART CATH AND CORS/GRAFTS ANGIOGRAPHY N/A 02/20/2023   Procedure: LEFT HEART CATH AND CORS/GRAFTS ANGIOGRAPHY;  Surgeon: Iran Ouch, MD;  Location: [facility];  Service: Cardiovascular;  Laterality: N/A;   NO PAST SURGERIES     RIGHT/LEFT HEART CATH AND CORONARY ANGIOGRAPHY Bilateral 11/27/2017   Procedure: RIGHT/LEFT HEART CATH AND CORONARY ANGIOGRAPHY;  Surgeon: Iran Ouch, MD;  Location: [facility];  Service: Cardiovascular;  Laterality: Bilateral;   TRANSCATHETER AORTIC VALVE REPLACEMENT, TRANSFEMORAL N/A 03/13/2018   Procedure: TRANSCATHETER AORTIC VALVE REPLACEMENT, TRANSFEMORAL. Edwards Sapien 3 Transcatheter Heart Valve size 26mm.;  Surgeon: Kathleene Hazel, MD;  Location: [facility] OR;  Service: Open Heart Surgery;  Laterality: N/A;    Allergies: Allergies as of 04/10/2023   (No Known Allergies)    Medications: Outpatient Encounter Medications as of 04/10/2023  Medication Sig   amLODipine (NORVASC) 10 MG tablet Take 1 tablet (10 mg total) by mouth daily.   aspirin EC 81 MG tablet Take 1 tablet (81 mg total) by mouth daily.   ezetimibe (ZETIA) 10 MG tablet Take 1 tablet (10 mg total) by mouth daily.   gabapentin (NEURONTIN) 300 MG capsule Take 1 capsule (300 mg total) by mouth at bedtime for 15 days, THEN 1 capsule (300 mg total) 2 (two) times daily.   losartan (COZAAR) 100 MG tablet Take 1 tablet (100 mg total) by mouth daily.   meclizine (ANTIVERT) 25 MG tablet Take 1 tablet (25 mg total) by mouth 3 (three) times daily as needed for dizziness.   metoprolol tartrate (LOPRESSOR) 100 MG tablet Take 1 tablet (100 mg total) by mouth 2 (two) times daily.   nitroGLYCERIN (NITROSTAT) 0.4 MG SL tablet Place 1 tablet (0.4 mg total) under the tongue every 5 (five) minutes as needed for chest pain. (Patient not taking: Reported on 03/16/2023)   pantoprazole (PROTONIX) 40 MG tablet TAKE 1 TABLET BY MOUTH TWO TIMES DAILY BEFORE A MEAL FOR 30 DAYS, THEN 1 TABLET BY MOUTH DAILY BEFORE BREAKFAST   rosuvastatin (CRESTOR) 40 MG tablet Take 1 tablet (40 mg total) by mouth daily.   sertraline (ZOLOFT) 100 MG tablet Take 1 tablet (100 mg total) by mouth daily.   terbinafine (LAMISIL) 250 MG tablet Take 1 tablet (250 mg total) by mouth daily. (Patient not taking: Reported on 03/07/2023)   No facility-administered encounter medications on file as of 04/10/2023.    Social History: Social History   Tobacco Use   Smoking status: Never   Smokeless tobacco: Never  Vaping Use   Vaping status: Never Used  Substance Use Topics   Alcohol use: No   Drug use: No    Family Medical History: Family History  Problem Relation Age of Onset   Heart disease  Father    Heart attack Father     Physical Examination: There were no vitals filed for this visit.    Awake, alert, oriented to person, place, and time.  Speech is clear and fluent. Fund of knowledge is appropriate.   Cranial Nerves: Pupils equal round and reactive to light.  Facial tone is symmetric.    Mild lower posterior lumbar tenderness.   No abnormal lesions on exposed skin.   Strength: Side Iliopsoas Quads Hamstring PF DF EHL  R 5 5 5 5 5 5   L 5 5 5 5 5 5    Reflexes are 2+ and symmetric at the patella and achilles.   Clonus is not present.   Bilateral lower extremity sensation is intact to light touch.     Gait is normal.    Medical Decision Making  Imaging: none  Assessment and Plan: Mr. Damian is a pleasant 67 y.o. male who had good relief (about 80% improvement) with lumbar ESI, but only for 4-5 days.   He has constant LBP with bilateral leg pain, right leg is lateral  to calf, left leg is more lateral hip pain. Pain is worse with walking, prolonged sitting/driving. He has numbness and tingling with weakness in his legs.   He has known lumbar spondylosis with right paracentral disc L4-L5 with bilateral lateral recess stenosis. LBP is likely due to spondylosis. Leg pain may be from L4-L5.  Treatment options discussed with patient and following plan made:   - Recommend he follow back up with Dr. Cherylann Ratel to discuss further injections.  - No relief with previous PT that he did for 6 weeks.  - Follow up with me in 6-8 weeks for recheck. If no improvement at that time, may need to have him follow up with one of the surgeons.   I spent a total of 15 minutes in face-to-face and non-face-to-face activities related to this patient's care today including review of outside records, review of imaging, review of symptoms, physical exam, discussion of differential diagnosis, discussion of treatment options, and documentation.   Drake Leach PA-C Dept. of Neurosurgery

## 2023-04-10 ENCOUNTER — Ambulatory Visit (INDEPENDENT_AMBULATORY_CARE_PROVIDER_SITE_OTHER): Payer: Medicare Other | Admitting: Orthopedic Surgery

## 2023-04-10 ENCOUNTER — Encounter: Payer: Self-pay | Admitting: Orthopedic Surgery

## 2023-04-10 VITALS — BP 132/86 | Ht 65.0 in | Wt 196.0 lb

## 2023-04-10 DIAGNOSIS — M48061 Spinal stenosis, lumbar region without neurogenic claudication: Secondary | ICD-10-CM | POA: Diagnosis not present

## 2023-04-10 DIAGNOSIS — M4726 Other spondylosis with radiculopathy, lumbar region: Secondary | ICD-10-CM | POA: Diagnosis not present

## 2023-04-10 DIAGNOSIS — M47816 Spondylosis without myelopathy or radiculopathy, lumbar region: Secondary | ICD-10-CM

## 2023-04-10 DIAGNOSIS — M5416 Radiculopathy, lumbar region: Secondary | ICD-10-CM

## 2023-04-18 ENCOUNTER — Encounter: Payer: Self-pay | Admitting: Student in an Organized Health Care Education/Training Program

## 2023-04-18 ENCOUNTER — Ambulatory Visit
Payer: Medicare Other | Attending: Student in an Organized Health Care Education/Training Program | Admitting: Student in an Organized Health Care Education/Training Program

## 2023-04-18 VITALS — BP 117/72 | HR 66 | Temp 98.4°F | Ht 65.0 in | Wt 192.0 lb

## 2023-04-18 DIAGNOSIS — Z951 Presence of aortocoronary bypass graft: Secondary | ICD-10-CM | POA: Insufficient documentation

## 2023-04-18 DIAGNOSIS — G8929 Other chronic pain: Secondary | ICD-10-CM | POA: Diagnosis present

## 2023-04-18 DIAGNOSIS — Z952 Presence of prosthetic heart valve: Secondary | ICD-10-CM | POA: Insufficient documentation

## 2023-04-18 DIAGNOSIS — M5416 Radiculopathy, lumbar region: Secondary | ICD-10-CM | POA: Insufficient documentation

## 2023-04-18 NOTE — Patient Instructions (Signed)

## 2023-04-18 NOTE — Progress Notes (Signed)
PROVIDER NOTE: Information contained herein reflects review and annotations entered in association with encounter. Interpretation of such information and data should be left to medically-trained personnel. Information provided to patient can be located elsewhere in the medical record under "Patient Instructions". Document created using STT-dictation technology, any transcriptional errors that may result from process are unintentional.    Patient: James Yates  Service Category: E/M  Provider: Edward Jolly, MD  DOB: 24-Dec-1955  DOS: 04/18/2023  Referring Provider: Glori Luis, MD  MRN: [id number]  Specialty: Interventional Pain Management  PCP: Glori Luis, MD  Type: Established Patient  Setting: Ambulatory outpatient    Location: Office  Delivery: Face-to-face     HPI  Mr. Stokely Southwell, a 67 y.o. year old male, is here today because of his Chronic radicular lumbar pain [M54.16, G89.29]. Mr. Grissett primary complain today is Hip Pain and Leg Pain (Right )  Pertinent problems: Mr. Royes does not have any pertinent problems on file. Pain Assessment: Severity of Chronic pain is reported as a 5 /10. Location: Hip Right/radiates from hip into right leg down to calf of right leg. Onset: More than a month ago. Quality: Aching, Tightness, Constant, Discomfort, Sharp, Shooting (stiff). Timing: Constant. Modifying factor(s): Medications, PT, moving around. Vitals:  height is 5\' 5"  (1.651 m) and weight is 192 lb (87.1 kg). His temporal temperature is 98.4 F (36.9 C). His blood pressure is 117/72 and his pulse is 66. His oxygen saturation is 98%.  BMI: Estimated body mass index is 31.95 kg/m as calculated from the following:   Height as of this encounter: 5\' 5"  (1.651 m).   Weight as of this encounter: 192 lb (87.1 kg). Last encounter: 03/07/2023. Last procedure: 03/22/2023.  Reason for encounter: post-procedure evaluation and assessment.  The patient, with a history of back pain  and leg pain, reported a 50% improvement in back pain for two weeks following a bilateral L5 nerve root transforaminal injection. However, he noted persistent leg pain. The patient was seen by neurosurgery, who recommended further diagnostic evaluation. The patient is on aspirin as a blood thinner. He expressed interest in another injection, preferring to avoid surgery if possible. The patient also requested a disability form for a handicap placard due to his orthopedic condition.  Post-procedure evaluation   Procedure: Lumbar trans-foraminal epidural steroid injection (L-TFESI) #1  Laterality: Bilateral (-50)  Level: L5 nerve root(s) Imaging: Fluoroscopy-guided         Anesthesia: Local anesthesia (1-2% Lidocaine) Sedation: No Sedation                       DOS: 03/22/2023  Performed by: Edward Jolly, MD  Purpose: Diagnostic/Therapeutic Indications: Lumbar radicular pain severe enough to impact quality of life or function. 1. Chronic radicular lumbar pain   2. Lumbar radiculopathy    NAS-11 Pain score:   Pre-procedure: 7 /10   Post-procedure: 5 /10      Effectiveness:  Initial hour after procedure: 100 %  Subsequent 4-6 hours post-procedure: 100 %  Analgesia past initial 6 hours: 50% for 2 weeks Ongoing improvement:  Analgesic:  back to basline Function: Back to baseline ROM: Back to baseline    ROS  Constitutional: Denies any fever or chills Gastrointestinal: No reported hemesis, hematochezia, vomiting, or acute GI distress Musculoskeletal:  low back and bilateral leg pain Neurological: No reported episodes of acute onset apraxia, aphasia, dysarthria, agnosia, amnesia, paralysis, loss of coordination, or loss of consciousness  Medication Review  HYDROcodone-acetaminophen,  amLODipine, aspirin EC, ezetimibe, losartan, meclizine, metoprolol tartrate, nitroGLYCERIN, pantoprazole, rosuvastatin, sertraline, silver sulfADIAZINE, and terbinafine  History Review  Allergy: Mr.  Bayat has No Known Allergies. Drug: Mr. Arif  reports no history of drug use. Alcohol:  reports no history of alcohol use. Tobacco:  reports that he has never smoked. He has never used smokeless tobacco. Social: Mr. Denis  reports that he has never smoked. He has never used smokeless tobacco. He reports that he does not drink alcohol and does not use drugs. Medical:  has a past medical history of Coronary artery disease, Glucose intolerance (impaired glucose tolerance), Headache, Hyperlipidemia, Hypertension, Obesity, S/P TAVR (transcatheter aortic valve replacement), and Severe aortic stenosis. Surgical: Mr. Biga  has a past surgical history that includes No past surgeries; Coronary artery bypass graft (N/A, 07/05/2013); Intraoprative transesophageal echocardiogram (N/A, 07/05/2013); Cardiac catheterization (06/2013); Cardiac catheterization (N/A, 10/29/2015); RIGHT/LEFT HEART CATH AND CORONARY ANGIOGRAPHY (Bilateral, 11/27/2017); Transcatheter aortic valve replacement, transfemoral (N/A, 03/13/2018); Intraoperative transthoracic echocardiogram (N/A, 03/13/2018); and LEFT HEART CATH AND CORS/GRAFTS ANGIOGRAPHY (N/A, 02/20/2023). Family: family history includes Heart attack in his father; Heart disease in his father.  Laboratory Chemistry Profile   Renal Lab Results  Component Value Date   BUN 15 02/14/2023   CREATININE 0.87 02/14/2023   BCR 17 02/14/2023   GFR 82.26 02/07/2023   GFRAA 95 07/15/2020   GFRNONAA >60 01/24/2023    Hepatic Lab Results  Component Value Date   AST 17 02/14/2023   ALT 19 02/14/2023   ALBUMIN 4.7 02/14/2023   ALKPHOS 107 02/14/2023   LIPASE 45 01/24/2023    Electrolytes Lab Results  Component Value Date   NA 146 (H) 02/14/2023   K 3.5 02/14/2023   CL 105 02/14/2023   CALCIUM 9.1 02/14/2023   MG 2.2 01/24/2023    Bone No results found for: "VD25OH", "VD125OH2TOT", "ZO1096EA5", "WU9811BJ4", "25OHVITD1", "25OHVITD2", "25OHVITD3", "TESTOFREE",  "TESTOSTERONE"  Inflammation (CRP: Acute Phase) (ESR: Chronic Phase) Lab Results  Component Value Date   ESRSEDRATE 16 06/17/2022   LATICACIDVEN 1.1 07/20/2015         Note: Above Lab results reviewed.   MR Lumbar Spine Wo Contrast   Narrative CLINICAL DATA:  Low back pain going into the right leg for over a year and worsening. No known injury.   EXAM: MRI LUMBAR SPINE WITHOUT CONTRAST   TECHNIQUE: Multiplanar, multisequence MR imaging of the lumbar spine was performed. No intravenous contrast was administered.   COMPARISON:  None Available.   FINDINGS: Segmentation:  Standard.   Alignment:  Physiologic.   Vertebrae: No acute fracture, evidence of discitis, or aggressive bone lesion.   Conus medullaris and cauda equina: Conus extends to the T12-L1 level. Conus and cauda equina appear normal.   Paraspinal and other soft tissues: No acute paraspinal abnormality.   Disc levels:   Disc spaces: Disc desiccation at L2-3, L3-4 and L5-S1. Minimal disc height loss at L4-5.   T12-L1: No significant disc bulge. No neural foraminal stenosis. No central canal stenosis.   L1-L2: No significant disc bulge. No neural foraminal stenosis. No central canal stenosis.   L2-L3: No significant disc bulge. Mild bilateral facet arthropathy. No foraminal or central canal stenosis.   L3-L4: Broad-based disc bulge with a right paracentral annular fissure. Mild bilateral facet arthropathy. No foraminal or central canal stenosis.   L4-L5: Broad-based disc bulge with a small central/right paracentral disc protrusion contacting the right intraspinal L5 nerve root. Mild bilateral facet arthropathy. No foraminal stenosis. No spinal stenosis. Bilateral  lateral recess stenosis.   L5-S1: Broad-based disc bulge with a small left paracentral annular fissure. Mild bilateral facet arthropathy. No foraminal or central canal stenosis.   IMPRESSION: 1. At L4-5 there is a broad-based disc  bulge with a small central/right paracentral disc protrusion contacting the right intraspinal L5 nerve root. Mild bilateral facet arthropathy. No foraminal stenosis. No spinal stenosis. Bilateral lateral recess stenosis. 2. At L3-4 there is a broad-based disc bulge with a right paracentral annular fissure. Mild bilateral facet arthropathy. No foraminal or central canal stenosis. 3. At L5-S1 there is a broad-based disc bulge with a small left paracentral annular fissure. Mild bilateral facet arthropathy. No foraminal or central canal stenosis. 4.  No acute osseous injury of the lumbar spine.   Physical Exam  General appearance: Well nourished, well developed, and well hydrated. In no apparent acute distress Mental status: Alert, oriented x 3 (person, place, & time)       Respiratory: No evidence of acute respiratory distress Eyes: PERLA Vitals: BP 117/72   Pulse 66   Temp 98.4 F (36.9 C) (Temporal)   Ht 5\' 5"  (1.651 m)   Wt 192 lb (87.1 kg)   SpO2 98%   BMI 31.95 kg/m  BMI: Estimated body mass index is 31.95 kg/m as calculated from the following:   Height as of this encounter: 5\' 5"  (1.651 m).   Weight as of this encounter: 192 lb (87.1 kg). Ideal: Ideal body weight: 61.5 kg (135 lb 9.3 oz) Adjusted ideal body weight: 71.7 kg (158 lb 2.4 oz)  Lumbar Spine Area Exam  Skin & Axial Inspection: No masses, redness, or swelling Alignment: Symmetrical Functional ROM: Pain restricted ROM       Stability: No instability detected Muscle Tone/Strength: Functionally intact. No obvious neuro-muscular anomalies detected. Sensory (Neurological): Dermatomal pain pattern right>left L5 Palpation: No palpable anomalies       Provocative Tests: Hyperextension/rotation test: (+) due to pain. Lumbar quadrant test (Kemp's test): (+) on the right >left for foraminal stenosis   Gait & Posture Assessment  Ambulation: Unassisted Gait: Relatively normal for age and body habitus Posture: WNL   Lower Extremity Exam      Side: Right lower extremity   Side: Left lower extremity  Stability: No instability observed           Stability: No instability observed          Skin & Extremity Inspection: Skin color, temperature, and hair growth are WNL. No peripheral edema or cyanosis. No masses, redness, swelling, asymmetry, or associated skin lesions. No contractures.   Skin & Extremity Inspection: Skin color, temperature, and hair growth are WNL. No peripheral edema or cyanosis. No masses, redness, swelling, asymmetry, or associated skin lesions. No contractures.  Functional ROM: Unrestricted ROM                   Functional ROM: Unrestricted ROM                  Muscle Tone/Strength: Functionally intact. No obvious neuro-muscular anomalies detected.   Muscle Tone/Strength: Functionally intact. No obvious neuro-muscular anomalies detected.  Sensory (Neurological): Neurogenic pain pattern right L5         Sensory (Neurological): Unimpaired        DTR: Patellar: deferred today Achilles: deferred today Plantar: deferred today   DTR: Patellar: deferred today Achilles: deferred today Plantar: deferred today  Palpation: No palpable anomalies   Palpation: No palpable anomalies     Assessment  Diagnosis  1. Chronic radicular lumbar pain   2. Lumbar radiculopathy   3. S/P CABG x 4   4. S/P TAVR (transcatheter aortic valve replacement)      Plan of Care  Chronic Low Back Pain with Radiculopathy   He experienced partial relief from a previous L5 nerve root transforaminal injection, with a 50% improvement for two weeks, but significant leg pain persists. We will attempt an intralaminar injection to potentially provide better relief and avoid surgery, aligning with his goal to avoid surgical intervention. He can continue aspirin. We will schedule an intralaminar epidural injection for next Wednesday and print and sign the disability form for the [employer].  Orders:  Orders Placed This Encounter   Procedures   Lumbar Epidural Injection    Standing Status:   Future    Standing Expiration Date:   07/19/2023    Scheduling Instructions:     Procedure: Interlaminar Lumbar Epidural Steroid injection (LESI)            Laterality: L4/5     Sedation: without     Timeframe: ASAA    Order Specific Question:   Where will this procedure be performed?    Answer:   [facility]   Follow-up plan:   Return in about 8 days (around 04/26/2023) for L4/5 ESI ([place]), in clinic [facility].      B/L L5 TF ESI 03/22/23     Recent Visits Date Type Provider Dept  03/22/23 Procedure visit Edward Jolly, MD [facility]  03/07/23 Office Visit Edward Jolly, MD [facility]  Showing recent visits within past 90 days and meeting all other requirements Today's Visits Date Type Provider Dept  04/18/23 Office Visit Edward Jolly, MD [facility]  Showing today's visits and meeting all other requirements Future Appointments No visits were found meeting these conditions. Showing future appointments within next 90 days and meeting all other requirements  I discussed the assessment and treatment plan with the patient. The patient was provided an opportunity to ask questions and all were answered. The patient agreed with the plan and demonstrated an understanding of the instructions.  Patient advised to call back or seek an in-person evaluation if the symptoms or condition worsens.  Duration of encounter: .  Total time on encounter, as per AMA guidelines included both the face-to-face and non-face-to-face time personally spent by the physician and/or other qualified health care professional(s) on the day of the encounter (includes time in activities that require the physician or other qualified health care professional and does not include time in activities normally performed by clinical staff). Physician's time may include the following activities when performed: Preparing  to see the patient (e.g., pre-charting review of records, searching for previously ordered imaging, lab work, and nerve conduction tests) Review of prior analgesic pharmacotherapies. Reviewing PMP Interpreting ordered tests (e.g., lab work, imaging, nerve conduction tests) Performing post-procedure evaluations, including interpretation of diagnostic procedures Obtaining and/or reviewing separately obtained history Performing a medically appropriate examination and/or evaluation Counseling and educating the patient/family/caregiver Ordering medications, tests, or procedures Referring and communicating with other health care professionals (when not separately reported) Documenting clinical information in the electronic or other health record Independently interpreting results (not separately reported) and communicating results to the patient/ family/caregiver Care coordination (not separately reported)  Note by: Edward Jolly, MD Date: 04/18/2023; Time: 1:33 PM

## 2023-04-18 NOTE — Progress Notes (Signed)
Safety precautions to be maintained throughout the outpatient stay will include: orient to surroundings, keep bed in low position, maintain call bell within reach at all times, provide assistance with transfer out of bed and ambulation.  

## 2023-04-26 ENCOUNTER — Ambulatory Visit
Payer: Medicare Other | Attending: Student in an Organized Health Care Education/Training Program | Admitting: Student in an Organized Health Care Education/Training Program

## 2023-04-26 ENCOUNTER — Ambulatory Visit
Admission: RE | Admit: 2023-04-26 | Discharge: 2023-04-26 | Disposition: A | Discharge status: Home / Self Care | Payer: Medicare Other | Source: Ambulatory Visit | Attending: Student in an Organized Health Care Education/Training Program | Admitting: Student in an Organized Health Care Education/Training Program

## 2023-04-26 ENCOUNTER — Encounter: Payer: Self-pay | Admitting: Student in an Organized Health Care Education/Training Program

## 2023-04-26 VITALS — BP 135/92 | HR 75 | Temp 98.1°F | Resp 16 | Ht 65.0 in | Wt 192.0 lb

## 2023-04-26 DIAGNOSIS — G8929 Other chronic pain: Secondary | ICD-10-CM

## 2023-04-26 DIAGNOSIS — M5416 Radiculopathy, lumbar region: Secondary | ICD-10-CM | POA: Diagnosis present

## 2023-04-26 MED ORDER — SODIUM CHLORIDE (PF) 0.9 % IJ SOLN
INTRAMUSCULAR | Status: AC
  Filled 2023-04-26: qty 10

## 2023-04-26 MED ORDER — SODIUM CHLORIDE 0.9% FLUSH
2.0000 mL | Freq: Once | INTRAVENOUS | Status: AC
  Administered 2023-04-26: 2 mL

## 2023-04-26 MED ORDER — ROPIVACAINE HCL 2 MG/ML IJ SOLN
2.0000 mL | Freq: Once | INTRAMUSCULAR | Status: AC
  Administered 2023-04-26: 2 mL via EPIDURAL
  Filled 2023-04-26: qty 20

## 2023-04-26 MED ORDER — IOHEXOL 180 MG/ML  SOLN
10.0000 mL | Freq: Once | INTRAMUSCULAR | Status: AC
  Administered 2023-04-26: 10 mL via EPIDURAL
  Filled 2023-04-26: qty 20

## 2023-04-26 MED ORDER — DEXAMETHASONE SODIUM PHOSPHATE 10 MG/ML IJ SOLN
10.0000 mg | Freq: Once | INTRAMUSCULAR | Status: AC
  Administered 2023-04-26: 10 mg
  Filled 2023-04-26: qty 1

## 2023-04-26 MED ORDER — LIDOCAINE HCL 2 % IJ SOLN
20.0000 mL | Freq: Once | INTRAMUSCULAR | Status: AC
  Administered 2023-04-26: 200 mg
  Filled 2023-04-26: qty 20

## 2023-04-26 NOTE — Patient Instructions (Signed)

## 2023-04-26 NOTE — Progress Notes (Signed)
Safety precautions to be maintained throughout the outpatient stay will include: orient to surroundings, keep bed in low position, maintain call bell within reach at all times, provide assistance with transfer out of bed and ambulation.  

## 2023-04-26 NOTE — Progress Notes (Signed)
PROVIDER NOTE: Interpretation of information contained herein should be left to medically-trained personnel. Specific patient instructions are provided elsewhere under "Patient Instructions" section of medical record. This document was created in part using STT-dictation technology, any transcriptional errors that may result from this process are unintentional.  Patient: James Yates Type: Established DOB: [birth year] MRN: [id number] PCP: Glori Luis, MD  Service: Procedure DOS: 04/26/2023 Setting: Ambulatory Location: Ambulatory outpatient facility Delivery: Face-to-face Provider: Edward Jolly, MD Specialty: Interventional Pain Management Specialty designation: 09 Location: Outpatient facility Ref. Prov.: Glori Luis, MD       Interventional Therapy   Type: Lumbar epidural steroid injection (LESI) (interlaminar) #1    Laterality: Right   Level:  L4-5 Level.  Imaging: Fluoroscopic guidance         Anesthesia: Local anesthesia (1-2% Lidocaine) DOS: 04/26/2023  Performed by: Edward Jolly, MD  Purpose: Diagnostic/Therapeutic Indications: Lumbar radicular pain of intraspinal etiology of more than 4 weeks that has failed to respond to conservative therapy and is severe enough to impact quality of life or function. 1. Chronic radicular lumbar pain   2. Lumbar radiculopathy    NAS-11 Pain score:   Pre-procedure: 6 /10   Post-procedure: 2 /10      Position / Prep / Materials:  Position: Prone w/ head of the table raised (slight reverse trendelenburg) to facilitate breathing.  Prep solution: ChloraPrep (2% chlorhexidine gluconate and 70% isopropyl alcohol) Prep Area: Entire Posterior Lumbar Region from lower scapular tip down to mid buttocks area and from flank to flank. Materials:  Tray: Epidural tray Needle(s):  Type: Epidural needle (Tuohy) Gauge (G):  22 Length: Regular (3.5-in) Qty: 1   H&P (Pre-op Assessment):  James Yates is a 67 y.o. (year old), male  patient, seen today for interventional treatment. 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 James Yates has a current medication list which includes the following prescription(s): amlodipine, aspirin ec, hydrocodone-acetaminophen, losartan, meclizine, metoprolol tartrate, nitroglycerin, pantoprazole, rosuvastatin, sertraline, ezetimibe, silver sulfadiazine, and terbinafine. His primarily concern today is the Back Pain  Initial Vital Signs:  Pulse/HCG Rate: 75ECG Heart Rate: 75 Temp: 98.1 F (36.7 C) Resp: 16 BP: 121/73 SpO2: 99 %  BMI: Estimated body mass index is 31.95 kg/m as calculated from the following:   Height as of this encounter: 5\' 5"  (1.651 m).   Weight as of this encounter: 192 lb (87.1 kg).  Risk Assessment: Allergies: Reviewed. He has No Known Allergies.  Allergy Precautions: None required Coagulopathies: Reviewed. None identified.  Blood-thinner therapy: None at this time Active Infection(s): Reviewed. None identified. James Yates is afebrile  Site Confirmation: James Yates was asked to confirm the procedure and laterality before marking the site Procedure checklist: Completed Consent: Before the procedure and under the influence of no sedative(s), amnesic(s), or anxiolytics, the patient was informed of the treatment options, risks and possible complications. To fulfill our ethical and legal obligations, as recommended by the American Medical Association's Code of Ethics, I have informed the patient of  my clinical impression; the nature and purpose of the treatment or procedure; the risks, benefits, and possible complications of the intervention; the alternatives, including doing nothing; the risk(s) and benefit(s) of the alternative treatment(s)  or procedure(s); and the risk(s) and benefit(s) of doing nothing. The patient was provided information about the general risks and possible complications associated with the procedure. These may include, but are not limited to: failure to achieve desired goals, infection, bleeding, organ or nerve damage, allergic reactions, paralysis, and death. In addition, the patient was informed of those risks and complications associated to Spine-related procedures, such as failure to decrease pain; infection (i.e.: Meningitis, epidural or intraspinal abscess); bleeding (i.e.: epidural hematoma, subarachnoid hemorrhage, or any other type of intraspinal or peri-dural bleeding); organ or nerve damage (i.e.: Any type of peripheral nerve, nerve root, or spinal cord injury) with subsequent damage to sensory, motor, and/or autonomic systems, resulting in permanent pain, numbness, and/or weakness of one or several areas of the body; allergic reactions; (i.e.: anaphylactic reaction); and/or death. Furthermore, the patient was informed of those risks and complications associated with the medications. These include, but are not limited to: allergic reactions (i.e.: anaphylactic or anaphylactoid reaction(s)); adrenal axis suppression; blood sugar elevation that in diabetics may result in ketoacidosis or comma; water retention that in patients with history of congestive heart failure may result in shortness of breath, pulmonary edema, and decompensation with resultant heart failure; weight gain; swelling or edema; medication-induced neural toxicity; particulate matter embolism and blood vessel occlusion with resultant organ, and/or nervous system infarction; and/or aseptic necrosis of one or  more joints. Finally, the patient was informed that Medicine is not an exact science; therefore, there is also the possibility of unforeseen or unpredictable risks and/or possible complications that may result in a catastrophic outcome. The patient indicated having understood very clearly. We have given the patient no guarantees and we have made no promises. Enough time was given to the patient to ask questions, all of which were answered to the patient's satisfaction. James Yates has indicated that he wanted to continue with the procedure. Attestation: I, the ordering provider, attest that I have discussed with the patient the benefits, risks, side-effects, alternatives, likelihood of achieving goals, and potential problems during recovery for the procedure that I have provided informed consent. Date  Time: 04/26/2023  1:04 PM   Pre-Procedure Preparation:  Monitoring: As per clinic protocol. Respiration, ETCO2, SpO2, BP, heart rate and rhythm monitor placed and checked for adequate function Safety Precautions: Patient was assessed for positional comfort and pressure points before starting the procedure. Time-out: I initiated and conducted the "Time-out" before starting the procedure, as per protocol. The patient was asked to participate by confirming the accuracy of the "Time Out" information. Verification of the correct person, site, and procedure were performed and confirmed by me, the nursing staff, and the patient. "Time-out" conducted as per Joint Commission's Universal Protocol (UP.01.01.01). Time: 1427 Start Time: 1427 hrs.  Description/Narrative of Procedure:          Target: Epidural space via interlaminar opening, initially targeting the lower laminar border of the superior vertebral body. Region: Lumbar Approach: Percutaneous paravertebral  Rationale (medical necessity): procedure needed and proper for the diagnosis and/or treatment of the patient's medical symptoms and  needs. Procedural Technique Safety Precautions: Aspiration looking for blood return was conducted prior to all injections. At no point did we inject any substances, as a needle was being advanced. No attempts were made at seeking any paresthesias. Safe injection practices and needle disposal techniques used. Medications properly checked for expiration dates. SDV (single dose vial) medications used. Description of the Procedure: Protocol guidelines were followed. The procedure needle was introduced through the skin, ipsilateral  to the reported pain, and advanced to the target area. Bone was contacted and the needle walked caudad, until the lamina was cleared. The epidural space was identified using "loss-of-resistance technique" with 2-3 ml of PF-NaCl (0.9% NSS), in a 5cc LOR glass syringe.  Vitals:   04/26/23 1328 04/26/23 1424 04/26/23 1429  BP: 121/73 115/80 (!) 135/92  Pulse: 75    Resp: 16 17 16   Temp: 98.1 F (36.7 C)    TempSrc: Temporal    SpO2: 99% 100% 99%  Weight: 192 lb (87.1 kg)    Height: 5\' 5"  (1.651 m)      Start Time: 1427 hrs. End Time: 1429 hrs.  Imaging Guidance (Spinal):          Type of Imaging Technique: Fluoroscopy Guidance (Spinal) Indication(s): Fluoroscopy guidance for needle placement to enhance accuracy in procedures requiring precise needle localization for targeted delivery of medication in or near specific anatomical locations not easily accessible without such real-time imaging assistance. Exposure Time: Please see nurses notes. Contrast: Before injecting any contrast, we confirmed that the patient did not have an allergy to iodine, shellfish, or radiological contrast. Once satisfactory needle placement was completed at the desired level, radiological contrast was injected. Contrast injected under live fluoroscopy. No contrast complications. See chart for type and volume of contrast used. Fluoroscopic Guidance: I was personally present during the use of  fluoroscopy. "Tunnel Vision Technique" used to obtain the best possible view of the target area. Parallax error corrected before commencing the procedure. "Direction-depth-direction" technique used to introduce the needle under continuous pulsed fluoroscopy. Once target was reached, antero-posterior, oblique, and lateral fluoroscopic projection used confirm needle placement in all planes. Images permanently stored in EMR. Interpretation: I personally interpreted the imaging intraoperatively. Adequate needle placement confirmed in multiple planes. Appropriate spread of contrast into desired area was observed. No evidence of afferent or efferent intravascular uptake. No intrathecal or subarachnoid spread observed. Permanent images saved into the patient's record.  Antibiotic Prophylaxis:   Anti-infectives (From admission, onward)    None      Indication(s): None identified  Post-operative Assessment:  Post-procedure Vital Signs:  Pulse/HCG Rate: 7575 Temp: 98.1 F (36.7 C) Resp: 16 BP: (!) 135/92 SpO2: 99 %  EBL: None  Complications: No immediate post-treatment complications observed by team, or reported by patient.  Note: The patient tolerated the entire procedure well. A repeat set of vitals were taken after the procedure and the patient was kept under observation following institutional policy, for this type of procedure. Post-procedural neurological assessment was performed, showing return to baseline, prior to discharge. The patient was provided with post-procedure discharge instructions, including a section on how to identify potential problems. Should any problems arise concerning this procedure, the patient was given instructions to immediately contact us, at any time, without hesitation. In any case, we plan to contact the patient by telephone for a follow-up status report regarding this interventional procedure.  Comments:  No additional relevant information.  Plan of Care (POC)   Orders:  Orders Placed This Encounter  Procedures   DG PAIN CLINIC C-ARM 1-60 MIN NO REPORT    Intraoperative interpretation by procedural physician at [facility].    Standing Status:   Standing    Number of Occurrences:   1    Order Specific Question:   Reason for exam:    Answer:   Assistance in needle guidance and placement for procedures requiring needle placement in or near specific anatomical locations not easily accessible  without such assistance.    Medications ordered for procedure: Meds ordered this encounter  Medications   iohexol (OMNIPAQUE) 180 MG/ML injection 10 mL    Must be Myelogram-compatible. If not available, you may substitute with a water-soluble, non-ionic, hypoallergenic, myelogram-compatible radiological contrast medium.   lidocaine (XYLOCAINE) 2 % (with pres) injection 400 mg   ropivacaine (PF) 2 mg/mL (0.2%) (NAROPIN) injection 2 mL   sodium chloride flush (NS) 0.9 % injection 2 mL   dexamethasone (DECADRON) injection 10 mg   Medications administered: We administered iohexol, lidocaine, ropivacaine (PF) 2 mg/mL (0.2%), sodium chloride flush, and dexamethasone.  See the medical record for exact dosing, route, and time of administration.  Follow-up plan:   Return in about 1 month (around 05/29/2023) for PPE F2F .       B/L L5 TF ESI 03/22/23, Right L4/5 ESI 04/26/23      Recent Visits Date Type Provider Dept  04/18/23 Office Visit Edward Jolly, MD [facility]  03/22/23 Procedure visit Edward Jolly, MD [facility]  03/07/23 Office Visit Edward Jolly, MD [facility]  Showing recent visits within past 90 days and meeting all other requirements Today's Visits Date Type Provider Dept  04/26/23 Procedure visit Edward Jolly, MD [facility]  Showing today's visits and meeting all other requirements Future Appointments Date Type Provider Dept  06/06/23 Appointment Edward Jolly, MD [facility]  Showing future appointments within next 90 days and meeting all other requirements  Disposition: Discharge home  Discharge (Date  Time): 04/26/2023;   hrs.   Primary Care Physician: Glori Luis, MD Location: [facility] Note by: Edward Jolly, MD (TTS technology used. I apologize for any typographical errors that were not detected and corrected.) Date: 04/26/2023; Time: 2:44 PM  Disclaimer:  Medicine is not an Visual merchandiser. The only guarantee in medicine is that nothing is guaranteed. It is important to note that the decision to proceed with this intervention was based on the information collected from the patient. The Data and conclusions were drawn from the patient's questionnaire, the interview, and the physical examination. Because the information was provided in large part by the patient, it cannot be guaranteed that it has not been purposely or unconsciously manipulated. Every effort has been made to obtain as much relevant data as possible for this evaluation. It is important to note that the conclusions that lead to this procedure are derived in large part from the available data. Always take into account that the treatment will also be dependent on availability of resources and existing treatment guidelines, considered by other Pain Management Practitioners as being common knowledge and practice, at the time of the intervention. For Medico-Legal purposes, it is also important to point out that variation in procedural techniques and pharmacological choices are the acceptable norm. The indications, contraindications, technique, and results of the above procedure should only be interpreted and judged by a Board-Certified Interventional Pain Specialist with extensive familiarity and expertise in the same exact procedure and technique.

## 2023-04-28 ENCOUNTER — Telehealth: Payer: Self-pay

## 2023-04-28 NOTE — Telephone Encounter (Signed)
Post procedure follow up..  Voicemail was full.  Unable to leave a message.

## 2023-04-28 NOTE — Telephone Encounter (Signed)
Post procedure follow up.  LM 

## 2023-05-06 ENCOUNTER — Other Ambulatory Visit: Payer: Self-pay | Admitting: Physician Assistant

## 2023-05-06 DIAGNOSIS — I10 Essential (primary) hypertension: Secondary | ICD-10-CM

## 2023-05-08 NOTE — Telephone Encounter (Signed)
last visit: 03/16/23 with plan to f/u in 3 months.  next visit:  06/16/23

## 2023-05-09 ENCOUNTER — Ambulatory Visit (INDEPENDENT_AMBULATORY_CARE_PROVIDER_SITE_OTHER): Payer: Medicare Other

## 2023-05-09 ENCOUNTER — Ambulatory Visit: Payer: Medicare Other | Admitting: Family Medicine

## 2023-05-09 ENCOUNTER — Encounter: Payer: Self-pay | Admitting: Family Medicine

## 2023-05-09 VITALS — BP 126/80 | HR 78 | Temp 98.5°F | Ht 65.0 in | Wt 197.4 lb

## 2023-05-09 DIAGNOSIS — E785 Hyperlipidemia, unspecified: Secondary | ICD-10-CM

## 2023-05-09 DIAGNOSIS — R1030 Lower abdominal pain, unspecified: Secondary | ICD-10-CM

## 2023-05-09 DIAGNOSIS — I25719 Atherosclerosis of autologous vein coronary artery bypass graft(s) with unspecified angina pectoris: Secondary | ICD-10-CM

## 2023-05-09 DIAGNOSIS — R109 Unspecified abdominal pain: Secondary | ICD-10-CM | POA: Insufficient documentation

## 2023-05-09 DIAGNOSIS — Z23 Encounter for immunization: Secondary | ICD-10-CM

## 2023-05-09 DIAGNOSIS — I10 Essential (primary) hypertension: Secondary | ICD-10-CM | POA: Diagnosis not present

## 2023-05-09 LAB — COMPREHENSIVE METABOLIC PANEL
ALT: 16 U/L (ref 0–53)
AST: 16 U/L (ref 0–37)
Albumin: 4.6 g/dL (ref 3.5–5.2)
Alkaline Phosphatase: 87 U/L (ref 39–117)
BUN: 14 mg/dL (ref 6–23)
CO2: 29 meq/L (ref 19–32)
Calcium: 8.7 mg/dL (ref 8.4–10.5)
Chloride: 103 meq/L (ref 96–112)
Creatinine, Ser: 0.88 mg/dL (ref 0.40–1.50)
GFR: 89.34 mL/min (ref >60.00)
Glucose, Bld: 125 mg/dL — ABNORMAL HIGH (ref 70–99)
Potassium: 3.7 meq/L (ref 3.5–5.1)
Sodium: 143 meq/L (ref 135–145)
Total Bilirubin: 0.7 mg/dL (ref 0.2–1.2)
Total Protein: 7.2 g/dL (ref 6.0–8.3)

## 2023-05-09 LAB — CBC WITH DIFFERENTIAL/PLATELET
Basophils Absolute: 0 10*3/uL (ref 0.0–0.1)
Basophils Relative: 0.3 % (ref 0.0–3.0)
Eosinophils Absolute: 0.1 10*3/uL (ref 0.0–0.7)
Eosinophils Relative: 1.8 % (ref 0.0–5.0)
HCT: 45.7 % (ref 39.0–52.0)
Hemoglobin: 15.2 g/dL (ref 13.0–17.0)
Lymphocytes Relative: 17.7 % (ref 12.0–46.0)
Lymphs Abs: 1.3 10*3/uL (ref 0.7–4.0)
MCHC: 33.4 g/dL (ref 30.0–36.0)
MCV: 89.5 fL (ref 78.0–100.0)
Monocytes Absolute: 0.4 10*3/uL (ref 0.1–1.0)
Monocytes Relative: 6 % (ref 3.0–12.0)
Neutro Abs: 5.4 10*3/uL (ref 1.4–7.7)
Neutrophils Relative %: 74.2 % (ref 43.0–77.0)
Platelets: 202 10*3/uL (ref 150.0–400.0)
RBC: 5.1 Mil/uL (ref 4.22–5.81)
RDW: 14.5 % (ref 11.5–15.5)
WBC: 7.2 10*3/uL (ref 4.0–10.5)

## 2023-05-09 NOTE — Patient Instructions (Signed)
Nice to see you. We will get lab work today and contact you with the results.  Will also get an x-ray today and contact you with the results.  If your pain gets worse or you develop fevers please get reevaluated.

## 2023-05-09 NOTE — Assessment & Plan Note (Signed)
Chronic issue.  Continue risk factor management. 

## 2023-05-09 NOTE — Assessment & Plan Note (Addendum)
Lower abdominal pain for a week or so.  Does have some tenderness that is mild on exam.  Discussed that this could represent a viral illness, constipation, or some other cause.  Will check lab work and an x-ray today to evaluate for stool burden.

## 2023-05-09 NOTE — Assessment & Plan Note (Signed)
Chronic issue.  Patient will continue losartan 100 mg daily, metoprolol 100 mg twice daily, and amlodipine 5 mg daily.  Blood pressures have been much better controlled during office visits.  Suspect home BP monitor might be inaccurate.

## 2023-05-09 NOTE — Assessment & Plan Note (Signed)
Chronic issue.  Patient will continue Crestor 40 mg daily and Zetia 10 mg daily.  Lab work to be done in January with cardiology.

## 2023-05-09 NOTE — Progress Notes (Signed)
Marikay Alar, MD Phone: [phone]  James Yates is a 67 y.o. male who presents today for f/u.  HYPERTENSION Disease Monitoring Home BP Monitoring 140 at home Chest pain- no    Dyspnea- no Medications Compliance-  taking losartan, metoprolol, amlodipine.  BMET    Component Value Date/Time   NA 146 (H) 02/14/2023 1206   NA 134 (L) 07/01/2013 1641   K 3.5 02/14/2023 1206   K 3.5 07/01/2013 1641   CL 105 02/14/2023 1206   CL 104 07/01/2013 1641   CO2 24 02/14/2023 1206   CO2 29 07/01/2013 1641   GLUCOSE 109 (H) 02/14/2023 1206   GLUCOSE 107 (H) 02/07/2023 1057   GLUCOSE 104 (H) 07/01/2013 1641   BUN 15 02/14/2023 1206   BUN 12 07/01/2013 1641   CREATININE 0.87 02/14/2023 1206   CREATININE 0.94 07/01/2013 1641   CALCIUM 9.1 02/14/2023 1206   CALCIUM 8.6 07/01/2013 1641   GFRNONAA >60 01/24/2023 0010   GFRNONAA >60 07/01/2013 1641   GFRAA 95 07/15/2020 1030   GFRAA >60 07/01/2013 1641   CAD/hyperlipidemia: Patient had a catheterization recently that revealed stable CAD.  They recommended continued medical management.  They placed him on Zetia to help with his cholesterol.  He continues on Crestor as well.  Lower abdominal pain: Patient notes this has been going on for the last week or so.  Notes the pain comes and goes.  Notes that sharp when it does occur.  No nausea.  He had a few nights where he had some vomiting.  No diarrhea.  No dysuria or urinary frequency.  He has a bowel movement every other day.  He notes they are normal-appearing.  No straining.  No fevers.   Social History   Tobacco Use  Smoking Status Never  Smokeless Tobacco Never    Current Outpatient Medications on File Prior to Visit  Medication Sig Dispense Refill   amLODipine (NORVASC) 10 MG tablet TOME 1 TABLETA POR VIA ORAL TODOS LOS DIAS 90 tablet 2   aspirin EC 81 MG tablet Take 1 tablet (81 mg total) by mouth daily. 30 tablet 3   ezetimibe (ZETIA) 10 MG tablet Take 1 tablet (10 mg  total) by mouth daily. (Patient not taking: Reported on 04/26/2023) 90 tablet 3   HYDROcodone-acetaminophen (NORCO/VICODIN) 5-325 MG tablet Take by mouth.     losartan (COZAAR) 100 MG tablet Take 1 tablet (100 mg total) by mouth daily. 90 tablet 3   meclizine (ANTIVERT) 25 MG tablet Take 1 tablet (25 mg total) by mouth 3 (three) times daily as needed for dizziness. 30 tablet 0   metoprolol tartrate (LOPRESSOR) 100 MG tablet Take 1 tablet (100 mg total) by mouth 2 (two) times daily. 180 tablet 1   nitroGLYCERIN (NITROSTAT) 0.4 MG SL tablet Place 1 tablet (0.4 mg total) under the tongue every 5 (five) minutes as needed for chest pain. 30 tablet 1   pantoprazole (PROTONIX) 40 MG tablet TAKE 1 TABLET BY MOUTH TWO TIMES DAILY BEFORE A MEAL FOR 30 DAYS, THEN 1 TABLET BY MOUTH DAILY BEFORE BREAKFAST 90 tablet 1   rosuvastatin (CRESTOR) 40 MG tablet Take 1 tablet (40 mg total) by mouth daily. 90 tablet 3   sertraline (ZOLOFT) 100 MG tablet Take 1 tablet (100 mg total) by mouth daily. 90 tablet 1   silver sulfADIAZINE (SILVADENE) 1 % cream Apply topically. (Patient not taking: Reported on 04/26/2023)     terbinafine (LAMISIL) 250 MG tablet Take 1 tablet (250 mg total)  by mouth daily. (Patient not taking: Reported on 04/26/2023) 56 tablet 0   No current facility-administered medications on file prior to visit.     ROS see history of present illness  Objective  Physical Exam Vitals:   05/09/23 1012  BP: 126/80  Pulse: 78  Temp: 98.5 F (36.9 C)  SpO2: 95%    BP Readings from Last 3 Encounters:  05/09/23 126/80  04/26/23 (!) 135/92  04/18/23 117/72   Wt Readings from Last 3 Encounters:  05/09/23 197 lb 6.4 oz (89.5 kg)  04/26/23 192 lb (87.1 kg)  04/18/23 192 lb (87.1 kg)    Physical Exam Constitutional:      General: He is not in acute distress.    Appearance: He is not diaphoretic.  Cardiovascular:     Rate and Rhythm: Normal rate and regular rhythm.     Heart sounds: Normal  heart sounds.  Pulmonary:     Effort: Pulmonary effort is normal.     Breath sounds: Normal breath sounds.  Abdominal:     General: Bowel sounds are normal. There is no distension.     Palpations: Abdomen is soft.     Tenderness: There is abdominal tenderness (Mild tenderness bilateral lower quadrants). There is no guarding.  Skin:    General: Skin is warm and dry.  Neurological:     Mental Status: He is alert.      Assessment/Plan: Please see individual problem list.  HTN, goal below 140/90 Assessment & Plan: Chronic issue.  Patient will continue losartan 100 mg daily, metoprolol 100 mg twice daily, and amlodipine 5 mg daily.  Blood pressures have been much better controlled during office visits.  Suspect home BP monitor might be inaccurate.    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  Continue risk factor management.   Hyperlipidemia, unspecified hyperlipidemia type Assessment & Plan: Chronic issue.  Patient will continue Crestor 40 mg daily and Zetia 10 mg daily.  Lab work to be done in January with cardiology.   Lower abdominal pain Assessment & Plan: Lower abdominal pain for a week or so.  Does have some tenderness that is mild on exam.  Discussed that this could represent a viral illness, constipation, or some other cause.  Will check lab work and an x-ray today to evaluate for stool burden.  Orders: -     Comprehensive metabolic panel -     CBC with Differential/Platelet -     DG Abd 1 View; Future    Return in about 3 months (around 08/07/2023) for Transfer of care.   Marikay Alar, MD [facility]

## 2023-05-28 NOTE — Progress Notes (Signed)
 Referring Physician:  Maribeth Camellia MATSU, MD [address] [place],  [place] [address]  Primary Physician:  Maribeth Camellia MATSU, MD  Interpreter used as he speaks spanish.   History of Present Illness: James Yates has a history of CAD, HTN, GERD, history of CABG, hyperlipidemia, obesity, s/p aortic valve replacement, hypercholesterolemia.   Last seen by me on 04/10/23 for chronic back and bilateral leg pain. He has known lumbar spondylosis with right paracentral disc L4-L5 with bilateral lateral recess stenosis. LBP is likely due to spondylosis. Leg pain may be from L4-L5.   He had right L4-L5 IL ESI by Dr. Marcelino on 04/26/23.   He is here for follow up.   He again had good relief with above ESI, but it only lasted for few days. He continues with constant LBP with bilateral leg pain, right leg is lateral to calf, left leg is more lateral hip pain. Right leg pain > left leg pain. Pain is worse in the morning. It is also worse with walking, prolonged sitting/driving. He has numbness and tingling with weakness in his legs.   He was on neurontin  and it helped. Is not taking it now. Not sure why he stopped. He was doing 300mg  q hs. Did not tolerate bid- made his sleepy.   Bowel/Bladder Dysfunction: none  He does not smoke.  Conservative measures:  Physical therapy: [facility] May to 11/23/22- does not look like he was discharged, but he did 6 weeks.  Multimodal medical therapy including regular antiinflammatories: advil , tylenol , neurontin  Injections:  right L4-L5 IL ESI by Dr. Marcelino on 04/26/23 bilateral L5 TF ESI 03/22/23 he had epidural steroid injections years ago   Past Surgery: no spinal surgery  The symptoms are causing a significant impact on the patient's life.   Review of Systems:  A 10 point review of systems is negative, except for the pertinent positives and negatives detailed in the HPI.  Past Medical History: Past Medical History:  Diagnosis  Date   Coronary artery disease    s/p CABG   Glucose intolerance (impaired glucose tolerance)    Headache    chronic, no red flag or atypical signs or symptoms   Hyperlipidemia    Hypertension    Obesity    S/P TAVR (transcatheter aortic valve replacement)    26 mm Edwards Sapien 3 transcatheter heart valve placed via percutaneous right transfemoral approach    Severe aortic stenosis     Past Surgical History: Past Surgical History:  Procedure Laterality Date   CARDIAC CATHETERIZATION  06/2013   [facility]   CARDIAC CATHETERIZATION N/A 10/29/2015   Procedure: Left Heart Cath and Cors/Grafts Angiography;  Surgeon: Deatrice DELENA Cage, MD;  Location: [facility] INVASIVE CV LAB;  Service: Cardiovascular;  Laterality: N/A;   CORONARY ARTERY BYPASS GRAFT N/A 07/05/2013   Procedure: CORONARY ARTERY BYPASS GRAFTING (CABG);  Surgeon: Maude Fleeta Ochoa, MD;  Location: [facility] OR;  Service: Open Heart Surgery;  Laterality: N/A;   INTRAOPERATIVE TRANSESOPHAGEAL ECHOCARDIOGRAM N/A 07/05/2013   Procedure: INTRAOPERATIVE TRANSESOPHAGEAL ECHOCARDIOGRAM;  Surgeon: Maude Fleeta Ochoa, MD;  Location: [facility] OR;  Service: Open Heart Surgery;  Laterality: N/A;   INTRAOPERATIVE TRANSTHORACIC ECHOCARDIOGRAM N/A 03/13/2018   Procedure: INTRAOPERATIVE TRANSTHORACIC ECHOCARDIOGRAM;  Surgeon: Verlin Lonni BIRCH, MD;  Location: [facility] OR;  Service: Open Heart Surgery;  Laterality: N/A;   LEFT HEART CATH AND CORS/GRAFTS ANGIOGRAPHY N/A 02/20/2023   Procedure: LEFT HEART CATH AND CORS/GRAFTS ANGIOGRAPHY;  Surgeon: Cage Deatrice DELENA, MD;  Location: [facility]  INVASIVE CV LAB;  Service: Cardiovascular;  Laterality: N/A;   NO PAST SURGERIES     RIGHT/LEFT HEART CATH AND CORONARY ANGIOGRAPHY Bilateral 11/27/2017   Procedure: RIGHT/LEFT HEART CATH AND CORONARY ANGIOGRAPHY;  Surgeon: Darron Deatrice LABOR, MD;  Location: [facility] INVASIVE CV LAB;  Service: Cardiovascular;  Laterality: Bilateral;   TRANSCATHETER AORTIC VALVE REPLACEMENT, TRANSFEMORAL N/A 03/13/2018    Procedure: TRANSCATHETER AORTIC VALVE REPLACEMENT, TRANSFEMORAL. Edwards Sapien 3 Transcatheter Heart Valve size 26mm.;  Surgeon: Verlin Lonni BIRCH, MD;  Location: [facility] OR;  Service: Open Heart Surgery;  Laterality: N/A;    Allergies: Allergies as of 05/30/2023   (No Known Allergies)    Medications: Outpatient Encounter Medications as of 05/30/2023  Medication Sig   amLODipine  (NORVASC ) 10 MG tablet TOME 1 TABLETA POR VIA ORAL TODOS LOS DIAS   aspirin  EC 81 MG tablet Take 1 tablet (81 mg total) by mouth daily.   gabapentin  (NEURONTIN ) 300 MG capsule Take 1 capsule (300 mg total) by mouth at bedtime.   losartan  (COZAAR ) 100 MG tablet Take 1 tablet (100 mg total) by mouth daily.   metoprolol  tartrate (LOPRESSOR ) 100 MG tablet Take 1 tablet (100 mg total) by mouth 2 (two) times daily.   pantoprazole  (PROTONIX ) 40 MG tablet TAKE 1 TABLET BY MOUTH TWO TIMES DAILY BEFORE A MEAL FOR 30 DAYS, THEN 1 TABLET BY MOUTH DAILY BEFORE BREAKFAST   rosuvastatin  (CRESTOR ) 40 MG tablet Take 1 tablet (40 mg total) by mouth daily.   sertraline  (ZOLOFT ) 100 MG tablet Take 1 tablet (100 mg total) by mouth daily.   nitroGLYCERIN  (NITROSTAT ) 0.4 MG SL tablet Place 1 tablet (0.4 mg total) under the tongue every 5 (five) minutes as needed for chest pain. (Patient not taking: Reported on 05/30/2023)   [DISCONTINUED] ezetimibe  (ZETIA ) 10 MG tablet Take 1 tablet (10 mg total) by mouth daily. (Patient not taking: Reported on 04/26/2023)   [DISCONTINUED] HYDROcodone -acetaminophen  (NORCO/VICODIN) 5-325 MG tablet Take by mouth.   [DISCONTINUED] meclizine  (ANTIVERT ) 25 MG tablet Take 1 tablet (25 mg total) by mouth 3 (three) times daily as needed for dizziness.   No facility-administered encounter medications on file as of 05/30/2023.    Social History: Social History   Tobacco Use   Smoking status: Never   Smokeless tobacco: Never  Vaping Use   Vaping status: Never Used  Substance Use Topics   Alcohol use: No    Drug use: No    Family Medical History: Family History  Problem Relation Age of Onset   Heart disease Father    Heart attack Father     Physical Examination: Vitals:   05/30/23 1107  BP: 116/72      Awake, alert, oriented to person, place, and time.  Speech is clear and fluent. Fund of knowledge is appropriate.   Cranial Nerves: Pupils equal round and reactive to light.  Facial tone is symmetric.    Mild lower posterior lumbar tenderness.   No abnormal lesions on exposed skin.   Strength: Side Iliopsoas Quads Hamstring PF DF EHL  R 5 5 5 5 5 5   L 5 5 5 5 5 5    Reflexes are 2+ and symmetric at the patella and achilles.   Clonus is not present.   Bilateral lower extremity sensation is intact to light touch.     Gait is normal.    Medical Decision Making  Imaging: none  Assessment and Plan: James Yates is a pleasant 67 y.o. male who had good relief (about 80% improvement)  with lumbar ESI x 2, but only for a few days.   He has constant LBP with bilateral leg pain, right leg is lateral to calf, left leg is more lateral hip pain. Right leg pain > left leg pain. He has numbness and tingling with weakness in his legs.   He has known lumbar spondylosis with right paracentral disc L4-L5 with bilateral lateral recess stenosis. LBP is likely due to spondylosis. Leg pain may be from L4-L5.  Treatment options discussed with patient and following plan made:   - He has failed conservative management. No long term improvement with PT (did for 6 weeks), injections, or medications.  - Will restart neurontin  300mg  q hs. Reviewed dosing and side effects. Will call if any issues.  - Lumbar flexion/extension xrays ordered. He will get prior to his follow up.  - Will cancel his follow up with Dr. Lateef for now. Message sent to [facility].  - Appointment scheduled with Dr. Claudene to discuss possible surgery options for lumbar spine. Interpreter will be requested.  - Of note, he has  significant cardiac history. May need cardiac clearance prior to surgery.   I spent a total of 20 minutes in face-to-face and non-face-to-face activities related to this patient's care today including review of outside records, review of imaging, review of symptoms, physical exam, discussion of differential diagnosis, discussion of treatment options, and documentation.   Glade Boys PA-C Dept. of Neurosurgery

## 2023-05-30 ENCOUNTER — Ambulatory Visit (INDEPENDENT_AMBULATORY_CARE_PROVIDER_SITE_OTHER): Payer: Medicare Other | Admitting: Orthopedic Surgery

## 2023-05-30 ENCOUNTER — Encounter: Payer: Self-pay | Admitting: Orthopedic Surgery

## 2023-05-30 VITALS — BP 116/72 | Ht 65.0 in | Wt 197.0 lb

## 2023-05-30 DIAGNOSIS — M4726 Other spondylosis with radiculopathy, lumbar region: Secondary | ICD-10-CM

## 2023-05-30 DIAGNOSIS — M48061 Spinal stenosis, lumbar region without neurogenic claudication: Secondary | ICD-10-CM | POA: Diagnosis not present

## 2023-05-30 DIAGNOSIS — M5416 Radiculopathy, lumbar region: Secondary | ICD-10-CM

## 2023-05-30 DIAGNOSIS — M47816 Spondylosis without myelopathy or radiculopathy, lumbar region: Secondary | ICD-10-CM

## 2023-05-30 MED ORDER — GABAPENTIN 300 MG PO CAPS: 300.0000 mg | ORAL_CAPSULE | Freq: Every day | ORAL | 1 refills | Status: DC

## 2023-06-06 ENCOUNTER — Ambulatory Visit
Admission: RE | Admit: 2023-06-06 | Discharge: 2023-06-06 | Disposition: A | Discharge status: Home / Self Care | Payer: Medicare Other | Source: Ambulatory Visit | Attending: Orthopedic Surgery | Admitting: Orthopedic Surgery

## 2023-06-06 ENCOUNTER — Ambulatory Visit: Payer: Medicare Other | Admitting: Student in an Organized Health Care Education/Training Program

## 2023-06-06 ENCOUNTER — Ambulatory Visit
Admission: RE | Admit: 2023-06-06 | Discharge: 2023-06-06 | Disposition: A | Discharge status: Home / Self Care | Payer: Medicare Other | Attending: Orthopedic Surgery | Admitting: Orthopedic Surgery

## 2023-06-06 DIAGNOSIS — M5416 Radiculopathy, lumbar region: Secondary | ICD-10-CM | POA: Insufficient documentation

## 2023-06-06 DIAGNOSIS — M47816 Spondylosis without myelopathy or radiculopathy, lumbar region: Secondary | ICD-10-CM | POA: Diagnosis present

## 2023-06-08 NOTE — Progress Notes (Signed)
 Referring Physician:  Glori Luis, MD [address] [place],  [place] [address]  Primary Physician:  Glori Luis, MD   History of Present Illness: 06/09/23 History of significant coronary artery disease and previous aortic surgery.  He has been following with Drake Leach for some time for her back and mostly right leg pain.  He has been dealing with a right sided L4-5 radiculopathy for multiple months.  He has been cared for in physical therapy previously.  He is also had multiple lumbar injections.  He feels like these are no longer giving him as much relief.  His main complaint is pain radiating down his right lower extremity down past his knee into the back of his leg.  This gets worse while he is walking standing or prolonged sitting.  He has tried neuropathic pain medications.  No bowel or bladder dysfunction.  No smoking.  Conservative measures:  Physical therapy: [facility] PT May to 11/23/22 Multimodal medical therapy including regular antiinflammatories: advil, tylenol, neurontin Injections:  right L4-L5 IL ESI by Dr. Cherylann Ratel on 04/26/23 bilateral L5 TF ESI 03/22/23 he had epidural steroid injections years ago    Past Surgery: no spinal surgery   The symptoms are causing a significant impact on the patient's life.  I have utilized the care everywhere function in epic to review the outside records available from external health systems.  Review of Systems:  A 10 point review of systems is negative, except for the pertinent positives and negatives detailed in the HPI.  Past Medical History: Past Medical History:  Diagnosis Date   Coronary artery disease    s/p CABG   Glucose intolerance (impaired glucose tolerance)    Headache    chronic, no red flag or atypical signs or symptoms   Hyperlipidemia    Hypertension    Obesity    S/P TAVR (transcatheter aortic valve replacement)    26 mm Edwards Sapien 3 transcatheter heart valve placed via  percutaneous right transfemoral approach    Severe aortic stenosis     Past Surgical History: Past Surgical History:  Procedure Laterality Date   CARDIAC CATHETERIZATION  06/2013   [facility]   CARDIAC CATHETERIZATION N/A 10/29/2015   Procedure: Left Heart Cath and Cors/Grafts Angiography;  Surgeon: Iran Ouch, MD;  Location: [facility] INVASIVE CV LAB;  Service: Cardiovascular;  Laterality: N/A;   CORONARY ARTERY BYPASS GRAFT N/A 07/05/2013   Procedure: CORONARY ARTERY BYPASS GRAFTING (CABG);  Surgeon: [name], MD;  Location: [facility] OR;  Service: Open Heart Surgery;  Laterality: N/A;   INTRAOPERATIVE TRANSESOPHAGEAL ECHOCARDIOGRAM N/A 07/05/2013   Procedure: INTRAOPERATIVE TRANSESOPHAGEAL ECHOCARDIOGRAM;  Surgeon: [name], MD;  Location: [facility] OR;  Service: Open Heart Surgery;  Laterality: N/A;   INTRAOPERATIVE TRANSTHORACIC ECHOCARDIOGRAM N/A 03/13/2018   Procedure: INTRAOPERATIVE TRANSTHORACIC ECHOCARDIOGRAM;  Surgeon: Kathleene Hazel, MD;  Location: [facility] OR;  Service: Open Heart Surgery;  Laterality: N/A;   LEFT HEART CATH AND CORS/GRAFTS ANGIOGRAPHY N/A 02/20/2023   Procedure: LEFT HEART CATH AND CORS/GRAFTS ANGIOGRAPHY;  Surgeon: Iran Ouch, MD;  Location: [facility] INVASIVE CV LAB;  Service: Cardiovascular;  Laterality: N/A;   NO PAST SURGERIES     RIGHT/LEFT HEART CATH AND CORONARY ANGIOGRAPHY Bilateral 11/27/2017   Procedure: RIGHT/LEFT HEART CATH AND CORONARY ANGIOGRAPHY;  Surgeon: Iran Ouch, MD;  Location: [facility] INVASIVE CV LAB;  Service: Cardiovascular;  Laterality: Bilateral;   TRANSCATHETER AORTIC VALVE REPLACEMENT, TRANSFEMORAL N/A 03/13/2018   Procedure: TRANSCATHETER AORTIC VALVE  REPLACEMENT, TRANSFEMORAL. Edwards Sapien 3 Transcatheter Heart Valve size 26mm.;  Surgeon: Kathleene Hazel, MD;  Location: [facility] OR;  Service: Open Heart Surgery;  Laterality: N/A;    Allergies: Allergies as of 06/09/2023   (No Known Allergies)    Medications:  Current  Outpatient Medications:    amLODipine (NORVASC) 10 MG tablet, TOME 1 TABLETA POR VIA ORAL TODOS LOS DIAS, Disp: 90 tablet, Rfl: 2   aspirin EC 81 MG tablet, Take 1 tablet (81 mg total) by mouth daily., Disp: 30 tablet, Rfl: 3   gabapentin (NEURONTIN) 300 MG capsule, Take 1 capsule (300 mg total) by mouth at bedtime., Disp: 30 capsule, Rfl: 1   losartan (COZAAR) 100 MG tablet, Take 1 tablet (100 mg total) by mouth daily., Disp: 90 tablet, Rfl: 3   metoprolol tartrate (LOPRESSOR) 100 MG tablet, Take 1 tablet (100 mg total) by mouth 2 (two) times daily., Disp: 180 tablet, Rfl: 1   pantoprazole (PROTONIX) 40 MG tablet, TAKE 1 TABLET BY MOUTH TWO TIMES DAILY BEFORE A MEAL FOR 30 DAYS, THEN 1 TABLET BY MOUTH DAILY BEFORE BREAKFAST, Disp: 90 tablet, Rfl: 1   rosuvastatin (CRESTOR) 40 MG tablet, Take 1 tablet (40 mg total) by mouth daily., Disp: 90 tablet, Rfl: 3   sertraline (ZOLOFT) 100 MG tablet, Take 1 tablet (100 mg total) by mouth daily., Disp: 90 tablet, Rfl: 1   nitroGLYCERIN (NITROSTAT) 0.4 MG SL tablet, Place 1 tablet (0.4 mg total) under the tongue every 5 (five) minutes as needed for chest pain. (Patient not taking: Reported on 06/09/2023), Disp: 30 tablet, Rfl: 1  Social History: Social History   Tobacco Use   Smoking status: Never   Smokeless tobacco: Never  Vaping Use   Vaping status: Never Used  Substance Use Topics   Alcohol use: No   Drug use: No    Family Medical History: Family History  Problem Relation Age of Onset   Heart disease Father    Heart attack Father     Physical Examination: Vitals:   06/09/23 0902  BP: (!) 138/102    General: Patient is in no apparent distress. Attention to examination is appropriate.  Neck:   Supple.  Full range of motion.  Respiratory: Patient is breathing without any difficulty.   NEUROLOGICAL:     Awake, alert, oriented to person, place, and time.  Speech is clear and fluent.   Cranial Nerves: Pupils equal round and reactive  to light.  Facial tone is symmetric.  Facial sensation is symmetric. Shoulder shrug is symmetric. Tongue protrusion is midline.    Strength:  Side Iliopsoas Quads Hamstring PF DF EHL  R 5 5 5 5 5 5   L 5 5 5 5 5 5    Reflexes are 2+ at the bilateral patella.  Left Achilles and medial hamstrings reflexes 2+.  Right Achilles and medial hamstrings reflex 1+.  Decreased sensation in the right lower extremity past the knee.  Straight leg raise is positive, hip examination negative.  No evidence of dysmetria noted.  Gait is normal.    Imaging: Narrative & Impression  CLINICAL DATA:  Low back pain going into the right leg for over a year and worsening. No known injury.   EXAM: MRI LUMBAR SPINE WITHOUT CONTRAST   TECHNIQUE: Multiplanar, multisequence MR imaging of the lumbar spine was performed. No intravenous contrast was administered.   COMPARISON:  None Available.   FINDINGS: Segmentation:  Standard.   Alignment:  Physiologic.   Vertebrae: No acute fracture,  evidence of discitis, or aggressive bone lesion.   Conus medullaris and cauda equina: Conus extends to the T12-L1 level. Conus and cauda equina appear normal.   Paraspinal and other soft tissues: No acute paraspinal abnormality.   Disc levels:   Disc spaces: Disc desiccation at L2-3, L3-4 and L5-S1. Minimal disc height loss at L4-5.   T12-L1: No significant disc bulge. No neural foraminal stenosis. No central canal stenosis.   L1-L2: No significant disc bulge. No neural foraminal stenosis. No central canal stenosis.   L2-L3: No significant disc bulge. Mild bilateral facet arthropathy. No foraminal or central canal stenosis.   L3-L4: Broad-based disc bulge with a right paracentral annular fissure. Mild bilateral facet arthropathy. No foraminal or central canal stenosis.   L4-L5: Broad-based disc bulge with a small central/right paracentral disc protrusion contacting the right intraspinal L5 nerve root.  Mild bilateral facet arthropathy. No foraminal stenosis. No spinal stenosis. Bilateral lateral recess stenosis.   L5-S1: Broad-based disc bulge with a small left paracentral annular fissure. Mild bilateral facet arthropathy. No foraminal or central canal stenosis.   IMPRESSION: 1. At L4-5 there is a broad-based disc bulge with a small central/right paracentral disc protrusion contacting the right intraspinal L5 nerve root. Mild bilateral facet arthropathy. No foraminal stenosis. No spinal stenosis. Bilateral lateral recess stenosis. 2. At L3-4 there is a broad-based disc bulge with a right paracentral annular fissure. Mild bilateral facet arthropathy. No foraminal or central canal stenosis. 3. At L5-S1 there is a broad-based disc bulge with a small left paracentral annular fissure. Mild bilateral facet arthropathy. No foraminal or central canal stenosis. 4.  No acute osseous injury of the lumbar spine.     Electronically Signed   By: Elige Ko M.D.   On: 01/23/2023 08:10   I have personally reviewed the images and agree with the above interpretation.  Medical Decision Making/Assessment and Plan: Mr. Loadholt is a pleasant 68 y.o. male with a significant cardiac history as well as a history of lower back pain radiating to his right lower extremity.  He has been following in our clinic since September and has been working through his conservative therapy since May.  He has had physical therapy as well as multiple spinal injections.  He continues today with continued worsening pain in his right lower extremity.  He does not have any weakness but does have a loss of sensation in the distal right lower extremity.  He also has decreased reflexes on the right distal lower extremity compared to the left at the medial hamstrings as well as the Achilles.  On imaging there is evidence of a L4-5 broad-based disc bulge with a small component of right paracentral disc protrusion contacting the  right sided L5 nerve root.  This is an clear localization with his symptomatology.  He has had previous relief with some injections however those are no longer working.  Continues to have a positive straight leg raise.  Given his continued radiculopathy that is refractory to conservative care, we have offered him a right sided L4-5 laminectomy discectomy through a minimally invasive approach.  He would like to go forward with surgery.  Risk and benefits were discussed through the interpreter services.  Thank you for involving me in the care of this patient.    Lovenia Kim MD/MSCR Neurosurgery

## 2023-06-08 NOTE — H&P (View-Only) (Signed)
Referring Physician:  Glori Luis, MD [address] [place],  [place] [address]  Primary Physician:  Glori Luis, MD   History of Present Illness: 06/09/23 History of significant coronary artery disease and previous aortic surgery.  He has been following with Drake Leach for some time for her back and mostly right leg pain.  He has been dealing with a right sided L4-5 radiculopathy for multiple months.  He has been cared for in physical therapy previously.  He is also had multiple lumbar injections.  He feels like these are no longer giving him as much relief.  His main complaint is pain radiating down his right lower extremity down past his knee into the back of his leg.  This gets worse while he is walking standing or prolonged sitting.  He has tried neuropathic pain medications.  No bowel or bladder dysfunction.  No smoking.  Conservative measures:  Physical therapy: Roseanne Reno PT May to 11/23/22 Multimodal medical therapy including regular antiinflammatories: advil, tylenol, neurontin Injections:  right L4-L5 IL ESI by Dr. Cherylann Ratel on 04/26/23 bilateral L5 TF ESI 03/22/23 he had epidural steroid injections years ago    Past Surgery: no spinal surgery   The symptoms are causing a significant impact on the patient's life.  I have utilized the care everywhere function in epic to review the outside records available from external health systems.  Review of Systems:  A 10 point review of systems is negative, except for the pertinent positives and negatives detailed in the HPI.  Past Medical History: Past Medical History:  Diagnosis Date   Coronary artery disease    s/p CABG   Glucose intolerance (impaired glucose tolerance)    Headache    chronic, no red flag or atypical signs or symptoms   Hyperlipidemia    Hypertension    Obesity    S/P TAVR (transcatheter aortic valve replacement)    26 mm Edwards Sapien 3 transcatheter heart valve placed via  percutaneous right transfemoral approach    Severe aortic stenosis     Past Surgical History: Past Surgical History:  Procedure Laterality Date   CARDIAC CAT[facility]IZATION  06/2013   [facility]   CARDIAC CATHETERIZATION N/A 10/29/2015   Procedure: Left Heart Cath and Cors/Grafts Angiography;  Surgeon: Iran Ouch, MD;  Location: [facility] INVASIVE CV LAB;  Service: Cardiovascular;  Laterality: N/A;   CORONARY ARTERY BYPASS GRAFT N/A 07/05/2013   Procedure: CORONARY ARTERY BYPASS GRAFTING (CABG);  Surgeon: [name], MD;  Location: [facility] OR;  Service: Open Heart Surgery;  Laterality: N/A;   INTRAOPERATIVE TRANSESOPHAGEAL ECHOCARDIOGRAM N/A 07/05/2013   Procedure: INTRAOPERATIVE TRANSESOPHAGEAL ECHOCARDIOGRAM;  Surgeon: [name], MD;  Location: [facility] OR;  Service: Open Heart Surgery;  Laterality: N/A;   INTRAOPERATIVE TRANSTHORACIC ECHOCARDIOGRAM N/A 03/13/2018   Procedure: INTRAOPERATIVE TRANSTHORACIC ECHOCARDIOGRAM;  Surgeon: Kathleene Hazel, MD;  Location: [facility] OR;  Service: Open Heart Surgery;  Laterality: N/A;   LEFT HEART CATH AND CORS/GRAFTS ANGIOGRAPHY N/A 02/20/2023   Procedure: LEFT HEART CATH AND CORS/GRAFTS ANGIOGRAPHY;  Surgeon: Iran Ouch, MD;  Location: [facility] INVASIVE CV LAB;  Service: Cardiovascular;  Laterality: N/A;   NO PAST SURGERIES     RIGHT/LEFT HEART CATH AND CORONARY ANGIOGRAPHY Bilateral 11/27/2017   Procedure: RIGHT/LEFT HEART CATH AND CORONARY ANGIOGRAPHY;  Surgeon: Iran Ouch, MD;  Location: [facility] INVASIVE CV LAB;  Service: Cardiovascular;  Laterality: Bilateral;   TRANSCATHETER AORTIC VALVE REPLACEMENT, TRANSFEMORAL N/A 03/13/2018   Procedure: TRANSCATHETER AORTIC VALVE  REPLACEMENT, TRANSFEMORAL. Edwards Sapien 3 Transcatheter Heart Valve size 26mm.;  Surgeon: Kathleene Hazel, MD;  Location: [facility] OR;  Service: Open Heart Surgery;  Laterality: N/A;    Allergies: Allergies as of 06/09/2023   (No Known Allergies)    Medications:  Current  Outpatient Medications:    amLODipine (NORVASC) 10 MG tablet, TOME 1 TABLETA POR VIA ORAL TODOS LOS DIAS, Disp: 90 tablet, Rfl: 2   aspirin EC 81 MG tablet, Take 1 tablet (81 mg total) by mouth daily., Disp: 30 tablet, Rfl: 3   gabapentin (NEURONTIN) 300 MG capsule, Take 1 capsule (300 mg total) by mouth at bedtime., Disp: 30 capsule, Rfl: 1   losartan (COZAAR) 100 MG tablet, Take 1 tablet (100 mg total) by mouth daily., Disp: 90 tablet, Rfl: 3   metoprolol tartrate (LOPRESSOR) 100 MG tablet, Take 1 tablet (100 mg total) by mouth 2 (two) times daily., Disp: 180 tablet, Rfl: 1   pantoprazole (PROTONIX) 40 MG tablet, TAKE 1 TABLET BY MOUTH TWO TIMES DAILY BEFORE A MEAL FOR 30 DAYS, THEN 1 TABLET BY MOUTH DAILY BEFORE BREAKFAST, Disp: 90 tablet, Rfl: 1   rosuvastatin (CRESTOR) 40 MG tablet, Take 1 tablet (40 mg total) by mouth daily., Disp: 90 tablet, Rfl: 3   sertraline (ZOLOFT) 100 MG tablet, Take 1 tablet (100 mg total) by mouth daily., Disp: 90 tablet, Rfl: 1   nitroGLYCERIN (NITROSTAT) 0.4 MG SL tablet, Place 1 tablet (0.4 mg total) under the tongue every 5 (five) minutes as needed for chest pain. (Patient not taking: Reported on 06/09/2023), Disp: 30 tablet, Rfl: 1  Social History: Social History   Tobacco Use   Smoking status: Never   Smokeless tobacco: Never  Vaping Use   Vaping status: Never Used  Substance Use Topics   Alcohol use: No   Drug use: No    Family Medical History: Family History  Problem Relation Age of Onset   Heart disease Father    Heart attack Father     Physical Examination: Vitals:   06/09/23 0902  BP: (!) 138/102    General: Patient is in no apparent distress. Attention to examination is appropriate.  Neck:   Supple.  Full range of motion.  Respiratory: Patient is breathing without any difficulty.   NEUROLOGICAL:     Awake, alert, oriented to person, place, and time.  Speech is clear and fluent.   Cranial Nerves: Pupils equal round and reactive  to light.  Facial tone is symmetric.  Facial sensation is symmetric. Shoulder shrug is symmetric. Tongue protrusion is midline.    Strength:  Side Iliopsoas Quads Hamstring PF DF EHL  R 5 5 5 5 5 5   L 5 5 5 5 5 5    Reflexes are 2+ at the bilateral patella.  Left Achilles and medial hamstrings reflexes 2+.  Right Achilles and medial hamstrings reflex 1+.  Decreased sensation in the right lower extremity past the knee.  Straight leg raise is positive, hip examination negative.  No evidence of dysmetria noted.  Gait is normal.    Imaging: Narrative & Impression  CLINICAL DATA:  Low back pain going into the right leg for over a year and worsening. No known injury.   EXAM: MRI LUMBAR SPINE WITHOUT CONTRAST   TECHNIQUE: Multiplanar, multisequence MR imaging of the lumbar spine was performed. No intravenous contrast was administered.   COMPARISON:  None Available.   FINDINGS: Segmentation:  Standard.   Alignment:  Physiologic.   Vertebrae: No acute fracture,  evidence of discitis, or aggressive bone lesion.   Conus medullaris and cauda equina: Conus extends to the T12-L1 level. Conus and cauda equina appear normal.   Paraspinal and other soft tissues: No acute paraspinal abnormality.   Disc levels:   Disc spaces: Disc desiccation at L2-3, L3-4 and L5-S1. Minimal disc height loss at L4-5.   T12-L1: No significant disc bulge. No neural foraminal stenosis. No central canal stenosis.   L1-L2: No significant disc bulge. No neural foraminal stenosis. No central canal stenosis.   L2-L3: No significant disc bulge. Mild bilateral facet arthropathy. No foraminal or central canal stenosis.   L3-L4: Broad-based disc bulge with a right paracentral annular fissure. Mild bilateral facet arthropathy. No foraminal or central canal stenosis.   L4-L5: Broad-based disc bulge with a small central/right paracentral disc protrusion contacting the right intraspinal L5 nerve root.  Mild bilateral facet arthropathy. No foraminal stenosis. No spinal stenosis. Bilateral lateral recess stenosis.   L5-S1: Broad-based disc bulge with a small left paracentral annular fissure. Mild bilateral facet arthropathy. No foraminal or central canal stenosis.   IMPRESSION: 1. At L4-5 there is a broad-based disc bulge with a small central/right paracentral disc protrusion contacting the right intraspinal L5 nerve root. Mild bilateral facet arthropathy. No foraminal stenosis. No spinal stenosis. Bilateral lateral recess stenosis. 2. At L3-4 there is a broad-based disc bulge with a right paracentral annular fissure. Mild bilateral facet arthropathy. No foraminal or central canal stenosis. 3. At L5-S1 there is a broad-based disc bulge with a small left paracentral annular fissure. Mild bilateral facet arthropathy. No foraminal or central canal stenosis. 4.  No acute osseous injury of the lumbar spine.     Electronically Signed   By: Elige Ko M.D.   On: 01/23/2023 08:10   I have personally reviewed the images and agree with the above interpretation.  Medical Decision Making/Assessment and Plan: Mr. Loadholt is a pleasant 68 y.o. male with a significant cardiac history as well as a history of lower back pain radiating to his right lower extremity.  He has been following in our clinic since September and has been working through his conservative therapy since May.  He has had physical therapy as well as multiple spinal injections.  He continues today with continued worsening pain in his right lower extremity.  He does not have any weakness but does have a loss of sensation in the distal right lower extremity.  He also has decreased reflexes on the right distal lower extremity compared to the left at the medial hamstrings as well as the Achilles.  On imaging there is evidence of a L4-5 broad-based disc bulge with a small component of right paracentral disc protrusion contacting the  right sided L5 nerve root.  This is an clear localization with his symptomatology.  He has had previous relief with some injections however those are no longer working.  Continues to have a positive straight leg raise.  Given his continued radiculopathy that is refractory to conservative care, we have offered him a right sided L4-5 laminectomy discectomy through a minimally invasive approach.  He would like to go forward with surgery.  Risk and benefits were discussed through the interpreter services.  Thank you for involving me in the care of this patient.    Lovenia Kim MD/MSCR Neurosurgery

## 2023-06-09 ENCOUNTER — Other Ambulatory Visit: Payer: Self-pay

## 2023-06-09 ENCOUNTER — Ambulatory Visit (INDEPENDENT_AMBULATORY_CARE_PROVIDER_SITE_OTHER): Payer: Medicare Other | Admitting: Neurosurgery

## 2023-06-09 ENCOUNTER — Encounter: Payer: Self-pay | Admitting: Neurosurgery

## 2023-06-09 VITALS — BP 138/102 | Ht 65.0 in | Wt 198.0 lb

## 2023-06-09 DIAGNOSIS — Z01818 Encounter for other preprocedural examination: Secondary | ICD-10-CM

## 2023-06-09 DIAGNOSIS — M4726 Other spondylosis with radiculopathy, lumbar region: Secondary | ICD-10-CM | POA: Diagnosis not present

## 2023-06-09 DIAGNOSIS — M5126 Other intervertebral disc displacement, lumbar region: Secondary | ICD-10-CM

## 2023-06-09 DIAGNOSIS — M47816 Spondylosis without myelopathy or radiculopathy, lumbar region: Secondary | ICD-10-CM | POA: Insufficient documentation

## 2023-06-09 DIAGNOSIS — M5416 Radiculopathy, lumbar region: Secondary | ICD-10-CM

## 2023-06-09 NOTE — Patient Instructions (Signed)
 Por favor, lea la informacin a continuacin con respecto a su prxima ciruga:    Ciruga planeada: Right L4-5 hemilaminectomy and discectomy    Fecha de la ciruga: 06/29/23 en el [facility]: [address] [place], [place] [address]) - se le informar sobre su hora de llegada en un da laboral anterior a su ciruga.    Cita preoperatoria en el Departamento de Pruebas de Preadmisin del [facility]: Le llamaremos para darle su fecha y hora para esta cita. Si se le ha programado una cita para education administrator, Engineer, Manufacturing Systems de Preadmisiones se immunologist del edificio [employer], [address], [address]. Por favor traiga a su cita todas los medicamentos recetados en los frascos originales. Durante esta cita, le informarn united stationers medicamentos que debe tomar en la maana de la ciruga y los medicamentos que debe dejar de tomar para la ciruga. Es posible que se le hagan pruebas de laboratorio (como exmenes de sangre, artist) en su cita preoperatoria. No es necesario que est en ayunas para estos exmenes. Si necesita cambiar su cita preoperatoria, por favor llame al [employer] de Preadmisin al [phone].     Anticoagulantes: Aspirin  81mg : Est bien que contine tomando la aspirina de 81mg      Aprobacin para la ciruga: enviaremos un formulario de aprobacin a Dr Darron    Restricciones comunes despus de la ciruga: No agacharse, no levantar objetos pesados, ni girarse/torcerse. Evite levantar objetos que pesen ms de 10 libras durante las primeras 6 semanas despus de la ciruga. En la medida de lo posible, evite las lubrizol corporation que impliquen levantar objetos, public librarian, barista, empujar o jalar, como lavar la ropa, pasar la aspiradora, hacer la compra y cuidar a los nios. Trate de pedir ayuda a amigos y familiares para estas actividades mientras se recupera. No  conduzca si est tomando medicamentos recetados para el dolor. Entre la semana 6 y la 12 despus de la operacin: evite levantar ms de 25 libras.    Cmo contactarnos: Si tiene preguntas/inquietudes antes o despus de la ciruga, puede comunicarse con nosotros llamando al [phone], o puede enviarnos un mensaje por Mychart. Puede ponerse en contacto con nosotros por telfono o a travs de Mychart de 8am-4pm, de lunes a viernes. *Por favor, tenga en cuenta: Las llamadas despus de las 4pm se transfieren a un servicio de metallurgist de corporate investment banker. Los mensajes de Mychart no se monitorean de forma rutinaria durante las noches, los fines de retail buyer ni [employer]. Por favor, llame a nuestro consultorio para ponerse en contacto con el servicio de contestador de llamadas externo para asuntos urgentes durante las horas no laborales.    Si tiene documentacin de FMLA/incapacidad, por favor, trigala al consultorio o envela por fax al [phone], a la atencin de [place].    Para las citas y documentacin de FMLA y de incapacidad: [place], [place], y [place]  Enfermera: Zahriyah Joo  Asistentes mdicas: Damien y Tarkio  Asociadas mdicas: Lyle Decamp, Edsel Goods, Glade Boys  Cirujanos: Reeves Daisy, MD y Penne Sharps, MD

## 2023-06-12 ENCOUNTER — Telehealth: Payer: Self-pay | Admitting: Cardiovascular Disease

## 2023-06-12 NOTE — Telephone Encounter (Signed)
   Pre-operative Risk Assessment    Patient Name: James Yates  DOB: 08-25-1955 MRN: [id number]   Date of last office visit: 03/06/23 Date of next office visit: 06/16/23   Request for Surgical Clearance    Procedure: Right L4-5 Hemilaminectomy and Discectomy   Date of Surgery:  Clearance 06/29/23                                Surgeon:  Penne Sharps, MD Surgeon's Group or Practice Name:  [facility]  Phone number:  [phone] Fax number:  [phone]   Type of Clearance Requested:  ok to stay on Aspirin  81mg     Type of Anesthesia:  Not Indicated   Additional requests/questions:    Signed, Arsenio Celine GAILS   06/12/2023, 8:46 AM

## 2023-06-12 NOTE — Telephone Encounter (Signed)
   Patient Name: James Yates  DOB: [birth year] MRN: [id number]  Primary Cardiologist: Deatrice Cage, MD  Chart reviewed as part of pre-operative protocol coverage.   Regarding ASA therapy, we recommend continuation of ASA throughout the perioperative period.  However, if the surgeon feels that cessation of ASA is required in the perioperative period, it may be stopped 5-7 days prior to surgery with a plan to resume it as soon as felt to be feasible from a surgical standpoint in the post-operative period.    Wyn Raddle, Jackee Shove, NP 06/12/2023, 9:16 AM

## 2023-06-16 ENCOUNTER — Ambulatory Visit: Payer: Medicare Other | Attending: Cardiovascular Disease | Admitting: Cardiovascular Disease

## 2023-06-16 ENCOUNTER — Encounter: Payer: Self-pay | Admitting: Cardiovascular Disease

## 2023-06-16 VITALS — BP 136/78 | HR 73 | Ht 65.0 in | Wt 197.8 lb

## 2023-06-16 DIAGNOSIS — I10 Essential (primary) hypertension: Secondary | ICD-10-CM | POA: Diagnosis present

## 2023-06-16 DIAGNOSIS — Z952 Presence of prosthetic heart valve: Secondary | ICD-10-CM | POA: Insufficient documentation

## 2023-06-16 DIAGNOSIS — E785 Hyperlipidemia, unspecified: Secondary | ICD-10-CM | POA: Insufficient documentation

## 2023-06-16 DIAGNOSIS — I25118 Atherosclerotic heart disease of native coronary artery with other forms of angina pectoris: Secondary | ICD-10-CM | POA: Diagnosis present

## 2023-06-16 NOTE — Patient Instructions (Signed)

## 2023-06-16 NOTE — Progress Notes (Signed)
Cardiology Office Note   Date:  06/16/2023   ID:  James Yates, DOB 1955/11/17, MRN [id number]  PCP: None Cardiologist:   Lorine Bears, MD   Chief Complaint  Patient presents with   Follow-up    3 month follow up visit. Patient states that when he walks he experiences chest pain due to his back pain in which he is having back surgery on Monday. Patient states that when it hurts, he has to brace himself when he is walking. Patient is doing well on today. Meds reviewed.       History of Present Illness: James Yates is a 68 y.o. male who presents for a followup visit regarding coronary artery disease status post CABG in February of 2015 after NSTEMI and severe aortic stenosis due to bicuspid aortic valve status post TAVR in October 2019. Other medical problems include hypertension, hyperlipidemia and RBBB . Cardiac catheterization in July 2019 showed significant underlying three-vessel coronary artery disease with patent LIMA to LAD, patent SVG to RCA and known chronically occluded SVG to OM 3 with occluded native left circumflex.  He had a ZIO monitor done in November of 2021 which showed 10 episodes of SVT.  Echocardiogram also November, 2021 showed an EF of 50 to 55% with normal functioning TAVR prosthesis.    He continued to have angina and thus he underwent cardiac catheterization September 2024 which showed no significant change in coronary anatomy.  His angina is felt to be due to ischemia in the left circumflex distribution with no good revascularization option.  His symptoms improved with blood pressure control. At the present time, he denies chest pain or shortness of breath.  He is scheduled for back surgery on January 30.   Past Medical History:  Diagnosis Date   Coronary artery disease    s/p CABG   Glucose intolerance (impaired glucose tolerance)    Headache    chronic, no red flag or atypical signs or symptoms   Hyperlipidemia    Hypertension    Obesity     S/P TAVR (transcatheter aortic valve replacement)    26 mm Edwards Sapien 3 transcatheter heart valve placed via percutaneous right transfemoral approach    Severe aortic stenosis     Past Surgical History:  Procedure Laterality Date   CARDIAC CAT[facility]IZATION  06/2013   [facility]   CARDIAC CATHETERIZATION N/A 10/29/2015   Procedure: Left Heart Cath and Cors/Grafts Angiography;  Surgeon: Iran Ouch, MD;  Location: [facility] INVASIVE CV LAB;  Service: Cardiovascular;  Laterality: N/A;   CORONARY ARTERY BYPASS GRAFT N/A 07/05/2013   Procedure: CORONARY ARTERY BYPASS GRAFTING (CABG);  Surgeon: [name], MD;  Location: [facility] OR;  Service: Open Heart Surgery;  Laterality: N/A;   INTRAOPERATIVE TRANSESOPHAGEAL ECHOCARDIOGRAM N/A 07/05/2013   Procedure: INTRAOPERATIVE TRANSESOPHAGEAL ECHOCARDIOGRAM;  Surgeon: [name], MD;  Location: [facility] OR;  Service: Open Heart Surgery;  Laterality: N/A;   INTRAOPERATIVE TRANSTHORACIC ECHOCARDIOGRAM N/A 03/13/2018   Procedure: INTRAOPERATIVE TRANSTHORACIC ECHOCARDIOGRAM;  Surgeon: Kathleene Hazel, MD;  Location: [facility] OR;  Service: Open Heart Surgery;  Laterality: N/A;   LEFT HEART CATH AND CORS/GRAFTS ANGIOGRAPHY N/A 02/20/2023   Procedure: LEFT HEART CATH AND CORS/GRAFTS ANGIOGRAPHY;  Surgeon: Iran Ouch, MD;  Location: [facility] INVASIVE CV LAB;  Service: Cardiovascular;  Laterality: N/A;   NO PAST SURGERIES     RIGHT/LEFT HEART CATH AND CORONARY ANGIOGRAPHY Bilateral 11/27/2017   Procedure: RIGHT/LEFT HEART CATH AND CORONARY ANGIOGRAPHY;  Surgeon: Iran Ouch, MD;  Location: [facility] INVASIVE CV LAB;  Service: Cardiovascular;  Laterality: Bilateral;   TRANSCATHETER AORTIC VALVE REPLACEMENT, TRANSFEMORAL N/A 03/13/2018   Procedure: TRANSCATHETER AORTIC VALVE REPLACEMENT, TRANSFEMORAL. Edwards Sapien 3 Transcatheter Heart Valve size 26mm.;  Surgeon: Kathleene Hazel, MD;  Location: [facility] OR;  Service: Open Heart Surgery;  Laterality: N/A;      Current Outpatient Medications  Medication Sig Dispense Refill   amLODipine (NORVASC) 10 MG tablet TOME 1 TABLETA POR VIA ORAL TODOS LOS DIAS 90 tablet 2   aspirin EC 81 MG tablet Take 1 tablet (81 mg total) by mouth daily. 30 tablet 3   gabapentin (NEURONTIN) 300 MG capsule Take 1 capsule (300 mg total) by mouth at bedtime. 30 capsule 1   losartan (COZAAR) 100 MG tablet Take 1 tablet (100 mg total) by mouth daily. 90 tablet 3   metoprolol tartrate (LOPRESSOR) 100 MG tablet Take 1 tablet (100 mg total) by mouth 2 (two) times daily. 180 tablet 1   nitroGLYCERIN (NITROSTAT) 0.4 MG SL tablet Place 1 tablet (0.4 mg total) under the tongue every 5 (five) minutes as needed for chest pain. 30 tablet 1   pantoprazole (PROTONIX) 40 MG tablet TAKE 1 TABLET BY MOUTH TWO TIMES DAILY BEFORE A MEAL FOR 30 DAYS, THEN 1 TABLET BY MOUTH DAILY BEFORE BREAKFAST 90 tablet 1   rosuvastatin (CRESTOR) 40 MG tablet Take 1 tablet (40 mg total) by mouth daily. 90 tablet 3   sertraline (ZOLOFT) 100 MG tablet Take 1 tablet (100 mg total) by mouth daily. 90 tablet 1   No current facility-administered medications for this visit.    Allergies:   Patient has no known allergies.    Social History:  The patient  reports that he has never smoked. He has never used smokeless tobacco. He reports that he does not drink alcohol and does not use drugs.   Family History:  The patient's family history includes Heart attack in his father; Heart disease in his father.    ROS:  Please see the history of present illness.   Otherwise, review of systems are positive for none.   All other systems are reviewed and negative.    PHYSICAL EXAM: VS:  BP 136/78   Pulse 73   Ht 5\' 5"  (1.651 m)   Wt 197 lb 12.8 oz (89.7 kg)   SpO2 96%   BMI 32.92 kg/m  , BMI Body mass index is 32.92 kg/m. GEN: Well nourished, well developed, in no acute distress  HEENT: normal  Neck: no JVD, carotid bruits, or masses Cardiac: RRR; no  rubs, or  gallops,no edema . There is a 2/6 systolic aortic murmur which is a early peaking. Respiratory:  clear to auscultation bilaterally, normal work of breathing GI: soft, nontender, nondistended, + BS MS: no deformity or atrophy  Skin: warm and dry, no rash Neuro:  Strength and sensation are intact Psych: euthymic mood, full affect   EKG:  EKG is  ordered today. EKG done showed: Normal sinus rhythm Right bundle branch block Inferior infarct , age undetermined When compared with ECG of 16-Mar-2023 15:13, No significant change was found      Recent Labs: 01/24/2023: Magnesium 2.2 05/09/2023: ALT 16; BUN 14; Creatinine, Ser 0.88; Hemoglobin 15.2; Platelets 202.0; Potassium 3.7; Sodium 143    Lipid Panel    Component Value Date/Time   CHOL 245 (H) 02/14/2023 1206   CHOL 215 (H) 07/02/2013 0058   TRIG 226 (H) 02/14/2023 1206   TRIG 143 07/02/2013 0058  HDL 41 02/14/2023 1206   HDL 38 (L) 07/02/2013 0058   CHOLHDL 6.0 (H) 02/14/2023 1206   CHOLHDL 5.3 04/11/2022 1057   VLDL 55 (H) 04/11/2022 1057   VLDL 29 07/02/2013 0058   LDLCALC 162 (H) 02/14/2023 1206   LDLCALC 148 (H) 07/02/2013 0058      Wt Readings from Last 3 Encounters:  06/16/23 197 lb 12.8 oz (89.7 kg)  06/09/23 198 lb (89.8 kg)  05/30/23 197 lb (89.4 kg)        ASSESSMENT AND PLAN:  1.  Status post TAVR for severe aortic stenosis:  Continue with antibiotic prophylaxis before dental procedures.  Most recent echocardiogram in November 2023 showed normal LV systolic function with normal functioning TAVR prosthesis and mean gradient of 11 mmHg.  2. Coronary artery disease involving native coronary arteries with stable angina: He reports improvement in symptoms after controlling his blood pressure and he is doing well at the present time.  3. Essential hypertension: Blood pressure is well-controlled on current medications.  4. Hyperlipidemia: Most recent lipid profile in September showed an LDL of 162 but he  was not consistently taking rosuvastatin.  Discussed the importance of compliance and he is on rosuvastatin 40 mg once daily.  Will consider adding ezetimibe if LDL remains above goal.  5.  Preop cardiovascular evaluation for back surgery: Low risk from a cardiac standpoint with no need for ischemic cardiac evaluation.      Disposition:   FU with me in 6  months  Signed,  Lorine Bears, MD  06/16/2023 11:19 AM    [facility]

## 2023-06-19 ENCOUNTER — Other Ambulatory Visit: Payer: Self-pay

## 2023-06-19 ENCOUNTER — Encounter
Admission: RE | Admit: 2023-06-19 | Discharge: 2023-06-19 | Disposition: A | Discharge status: Home / Self Care | Payer: Medicare Other | Source: Ambulatory Visit | Attending: Neurosurgery | Admitting: Neurosurgery

## 2023-06-19 VITALS — BP 132/76 | HR 70 | Temp 98.9°F | Resp 18 | Ht 65.0 in | Wt 198.2 lb

## 2023-06-19 DIAGNOSIS — Z01812 Encounter for preprocedural laboratory examination: Secondary | ICD-10-CM | POA: Diagnosis present

## 2023-06-19 DIAGNOSIS — I25719 Atherosclerosis of autologous vein coronary artery bypass graft(s) with unspecified angina pectoris: Secondary | ICD-10-CM | POA: Insufficient documentation

## 2023-06-19 DIAGNOSIS — I10 Essential (primary) hypertension: Secondary | ICD-10-CM | POA: Diagnosis not present

## 2023-06-19 DIAGNOSIS — Z01818 Encounter for other preprocedural examination: Secondary | ICD-10-CM

## 2023-06-19 HISTORY — DX: Acute myocardial infarction, unspecified: I21.9

## 2023-06-19 HISTORY — DX: Tinea unguium: B35.1

## 2023-06-19 HISTORY — DX: Spondylosis without myelopathy or radiculopathy, lumbar region: M47.816

## 2023-06-19 HISTORY — DX: Radiculopathy, cervical region: M54.12

## 2023-06-19 HISTORY — DX: Gastro-esophageal reflux disease without esophagitis: K21.9

## 2023-06-19 HISTORY — DX: Heart failure, unspecified: I50.9

## 2023-06-19 HISTORY — DX: Radiculopathy, lumbar region: M54.16

## 2023-06-19 LAB — URINALYSIS, COMPLETE (UACMP) WITH MICROSCOPIC
Bacteria, UA: NONE SEEN
Bilirubin Urine: NEGATIVE
Glucose, UA: NEGATIVE mg/dL
Hgb urine dipstick: NEGATIVE
Ketones, ur: NEGATIVE mg/dL
Leukocytes,Ua: NEGATIVE
Nitrite: NEGATIVE
Protein, ur: 30 mg/dL — AB
Specific Gravity, Urine: 1.02 (ref 1.005–1.030)
pH: 5 (ref 5.0–8.0)

## 2023-06-19 LAB — SURGICAL PCR SCREEN
MRSA, PCR: NEGATIVE
Staphylococcus aureus: NEGATIVE

## 2023-06-19 LAB — TYPE AND SCREEN
ABO/RH(D): B POS
Antibody Screen: NEGATIVE

## 2023-06-19 NOTE — Patient Instructions (Addendum)
Su trmite est programado para el: Jueves 30 de Enero  Presntese en el mostrador de Chartered certified accountant piso del [facility] Para saber su hora de llegada, llame al [phone] entre la 1:00 p. m. y las 3:00 p. m. el mircoles 29 de enero.  Si su hora de llegada es a las 6:00 am, no llegue antes de esa hora ya que las puertas de [place] [facility] no se abren hasta las 6:00 am.  RECORDAR: Las instrucciones que no se siguen completamente pueden generar riesgos mdicos graves, que pueden llegar hasta la [place]; o, segn el criterio de su cirujano y Scientific laboratory technician, es posible que sea Aeronautical engineer su Leisure centre manager.  No ingiera alimentos despus de la medianoche del da anterior a la ciruga.  No mascar chicle ni caramelos duros.  Sin embargo, puede beber AGUA hasta 2 horas antes de la fecha prevista de llegada a la [place]. No beba nada dentro de las 2 horas anteriores a su hora de Nurse, learning disability.   Una semana antes de la ciruga: A partir del jueves 23 de enero  Detenga los antiinflamatorios (AINE) como Advil, Aleve, Ibuprofeno, Motrin, Naproxen, Naprosyn y productos a base de aspirina como Excedrin, Goody's Powder, BC Powder. Suspenda CUALQUIER suplemento de venta libre hasta despus de la [place].  Sin embargo, puede Educational psychologist tomando Tylenol si es necesario para Marketing executive de la [place].  **Siga las recomendaciones sobre cmo suspender los anticoagulantes.** Est bien continuar con aspirina EC segn el Dr. Katrinka Blazing   Contine tomando todos sus dems medicamentos recetados hasta el da de la [place].  EL DA DE LA CIRUGA SLO TOMA ESTOS MEDICAMENTOS CON sorbos de agua:  1. amLODipino (NORVASC)  2. tartrato de metoprolol (LOPRESSOR)  3. pantoprazol (PROTONIX)  4. sertralina (ZOLOFT)   No consumir alcohol durante 24 horas antes o despus de la [place].  No fumar, incluidos los cigarrillos electrnicos, durante las 24 horas previas a la [place].  No  consumir productos de tabaco masticables durante al menos 6 horas antes de la [place].  Sin parches de Optometrist de la [place].  No use ningn medicamento "recreativo" durante al menos una semana (preferiblemente 2 semanas) antes de la ciruga.  Tenga en cuenta que la combinacin de cocana y anestesia puede [employer], que pueden llegar hasta la [place]. Si su prueba de cocana da positivo, su ciruga ser cancelada.  La maana de la ciruga cepille sus dientes con pasta dental y agua, puede enjuagarse la boca con enjuague bucal si lo desea. No ingiera pasta de dientes ni enjuague bucal.  Utilice el jabn CHG como se indica en la hoja de instrucciones.  No use joyas, maquillaje, horquillas, clips ni esmalte de uas.  Para joyera soldada (permanente): pulseras, tobilleras, cinturillas, etc. Retrelos antes de la ciruga.  Si no se extrae, existe la posibilidad de que el personal del hospital deba cortrselo el da de la [place].  No use lociones, polvos ni perfumes.   No se afeite el vello corporal desde el cuello hacia abajo 48 horas antes de la [place].  No se pueden usar lentes de contacto, audfonos ni dentaduras postizas durante la [place].  No lleve objetos de valor al hospital. [facility] no es responsable de ninguna pertenencia u objeto de valor perdido o perdido.   Notifique a su mdico si hay algn cambio en su condicin mdica (resfriado, fiebre, infeccin).  Lleve ropa cmoda (especfica para su tipo de [place]) al hospital.  Despus de la [place], usted puede ayudar a prevenir complicaciones pulmonares haciendo ejercicios de respiracin.  Respire profundamente y tosa cada 1 o 2 horas.   Si le dan el alta el da de la [place], no se le permitir conducir a casa. Necesitar que una persona responsable lo lleve a su casa y se quede con usted durante las 24 horas posteriores a la [place].   Si viaja en transporte pblico, deber ir acompaado de una  persona responsable.  Llame al Departamento de pruebas previas a la admisin al [phone] si tiene alguna pregunta sobre estas instrucciones.  Poltica de visitas a ciruga:  Los [employer] se someten a [place] o un procedimiento pueden [employer] visitantes.  Los nios menores de 16 aos deben estar acompaados por un adulto que no sea el [place].  Restricciones temporales para visitantes Debido al aumento de casos de gripe, ( Virus sincitial respiratorio) RSV y COVID-19:   Los nios menores de 12 aos no podrn visitar a los Dispensing optician hospitales de [employer] en la mayora de las circunstancias.      Your procedure is scheduled on:  Thursday January 30  Report to the Registration Desk on the 1st floor of the [facility] To find out your arrival time, please call [phone] between 1PM - 3PM on:  Wednesday January 29  If your arrival time is 6:00 am, do not arrive before that time as the [facility] entrance doors do not open until 6:00 am.  REMEMBER: Instructions that are not followed completely may result in serious medical risk, up to and including death; or upon the discretion of your surgeon and anesthesiologist your surgery may need to be rescheduled.  Do not eat food after midnight the night before surgery.  No gum chewing or hard candies.  You may however, drink WATER up to 2 hours before you are scheduled to arrive for your surgery. Do not drink anything within 2 hours of your scheduled arrival time.   One week prior to surgery: Starting Thursday January 23  Stop Anti-inflammatories (NSAIDS) such as Advil, Aleve, Ibuprofen, Motrin, Naproxen, Naprosyn and Aspirin based products such as Excedrin, Goody's Powder, BC Powder. Stop ANY OVER THE COUNTER supplements until after surgery.  You may however, continue to take Tylenol if needed for pain up until the day of surgery.  **Follow recommendations regarding stopping blood thinners.** Okay to  continue aspirin EC per Dr. Katrinka Blazing   Continue taking all of your other prescription medications up until the day of surgery.  ON THE DAY OF SURGERY ONLY TAKE THESE MEDICATIONS WITH SIPS OF WATER:  amLODipine (NORVASC)  metoprolol tartrate (LOPRESSOR)  pantoprazole (PROTONIX)  sertraline (ZOLOFT)   No Alcohol for 24 hours before or after surgery.  No Smoking including e-cigarettes for 24 hours before surgery.  No chewable tobacco products for at least 6 hours before surgery.  No nicotine patches on the day of surgery.  Do not use any "recreational" drugs for at least a week (preferably 2 weeks) before your surgery.  Please be advised that the combination of cocaine and anesthesia may have negative outcomes, up to and including death. If you test positive for cocaine, your surgery will be cancelled.  On the morning of surgery brush your teeth with toothpaste and water, you may rinse your mouth with mouthwash if you wish. Do not swallow any toothpaste or mouthwash.  Use CHG Soap as directed on instruction sheet.  Do not wear jewelry, make-up,  hairpins, clips or nail polish.  For welded (permanent) jewelry: bracelets, anklets, waist bands, etc.  Please have this removed prior to surgery.  If it is not removed, there is a chance that hospital personnel will need to cut it off on the day of surgery.  Do not wear lotions, powders, or perfumes.   Do not shave body hair from the neck down 48 hours before surgery.  Contact lenses, hearing aids and dentures may not be worn into surgery.  Do not bring valuables to the hospital. [facility] is not responsible for any missing/lost belongings or valuables.   Notify your doctor if there is any change in your medical condition (cold, fever, infection).  Wear comfortable clothing (specific to your surgery type) to the hospital.  After surgery, you can help prevent lung complications by doing breathing exercises.  Take deep breaths and cough  every 1-2 hours.   If you are being discharged the day of surgery, you will not be allowed to drive home. You will need a responsible individual to drive you home and stay with you for 24 hours after surgery.   If you are taking public transportation, you will need to have a responsible individual with you.  Please call the Pre-admissions Testing Dept. at [phone] if you have any questions about these instructions.  Surgery Visitation Policy:  Patients having surgery or a procedure may have two visitors.  Children under the age of 2 must have an adult with them who is not the patient.  Temporary Visitor Restrictions Due to increasing cases of flu, RSV and COVID-19: Children ages 86 and under will not be able to visit patients in [facility] hospitals under most circumstances.          Instrucciones para baarse con el jabn de 5 CHG antes de la ciruga Pre-operative 5 CHG Bath Instructions  Usted puede desempear un papel clave en la reduccin del riesgo de infeccin despus de la ciruga. Su piel debe estar lo ms limpia posible de grmenes. Puede reducir el nmero de grmenes en su piel lavndose con un jabn de CHG (gluconato de clorhexidina) antes de la ciruga. El CHG es un jabn antisptico que [employer] grmenes y sigue matndolos incluso despus de que se bae.  NO LO UTILICE si usted tiene alergia a la clorhexidina/CHG o a los Theatre manager. Si la piel se le enrojece o se irrita, deje de usar el CHG e infrmelo a uno de nuestros enfermeros(as) al [phone].  Por favor, dchese con el jabn CHG comenzando 4 das antes de la ciruga segn el horario siguiente:     Por favor, tenga en cuenta lo siguiente:  NO SE AFEITE, incluyendo las piernas y [place], a Glass blower/designer del da de la primera ducha. Usted puede afeitarse la cara en cualquier momento antes o el mismo da de la [place]. Ponga sbanas limpias en su cama el da que comience a usar el jabn  CHG. Use una toallita limpia (que no haya utilizado desde que se lav) para cada ducha. NO duerma con mascotas una vez que empiece a usar el CHG.  Instrucciones para la ducha con el CHG:  Si usted elige lavarse el pelo y las partes ntimas, lvese primero con el champ/jabn habitual.  Despus de usar el champ/jabn, enjuguese completamente el pelo y el cuerpo para eliminar los residuos del champ/jabn.  CIERRE el grifo y aplique unas 3 cucharadas (45 ml) del jabn CHG en una toallita LIMPIA. Aplquese el Mauri Pole  CHG SOLAMENTE DESDE EL CUELLO HASTA LOS DEDOS DE LOS PIES (lavndose durante 3-5 minutos).  NO USE el jabn CHG en la cara, las zonas ntimas, en heridas abiertas o en llagas.  Preste una atencin especial al rea donde le Zenaida Niece a operar.  Si le van a [employer], puede ser conveniente que otra persona le lave la espalda. Espere 2 minutos despus de haberse aplicado el jabn CHG, luego se lo puede enjuagar.  Squese con toquecitos ligeros usando una toalla limpia. Pngase ropa/pijama limpia. Si usted elige ponerse locin o crema, por favor, SOLAMENTE use las lociones compatibles con el CHG que se enumeran al reverso de este papel.    Instrucciones adicionales para Medical laboratory scientific officer de la ciruga: NO SE APLIQUE lociones, desodorantes, colonias ni perfumes.    Pngase ropa limpia y cmoda.   Cepllese los dientes.  Pregunte a su enfermero(a) antes de aplicarse cualquier medicamento recetado a la piel.      Lociones compatibles con el CHG   Aveeno Moisturizing Lotion (locin humectante Aveeno)  Cetaphil Moisturizing Cream (crema humectante Cetaphil)  Cetaphil Moisturizing Lotion (locin humectante Cetaphil)  Clairol Herbal Essence Moisturizing Lotion, Dry Skin (locin Clairol Herbal Essence humectante para piel seca)  Clairol Herbal Essence Moisturizing Lotion, Extra Dry Skin (locin Clairol Herbal Essence humectante para piel muy reseca)  Clairol Herbal Essence Moisturizing  Lotion, Normal Skin (locin Clairol Herbal Essence humectante para piel normal)  Curel Age Defying Therapeutic Moisturizing Lotion with Alpha Hydroxy (locin Curel humectante teraputica para el antienvejecimiento con alfahidroxicidos)  Curel Extreme Care Body Lotion (locin corporal Curel para cuidados extremos)  Curel Soothing Hands Moisturizing Hand Lotion (locin Curel calmante para las manos)  Curel Therapeutic Moisturizing Cream, Fragrance-Free (crema Curel humectante teraputica sin fragancias)  Curel Therapeutic Moisturizing Lotion, Fragrance-Free (locin Curel humectante teraputica sin fragancias)  Curel Therapeutic Moisturizing Lotion, Original Formula (locin Curel humectante teraputica de frmula original)  Eucerin Daily Replenishing Lotion (locin Eucerin rejuvenecedora diaria)  Eucerin Dry Skin Therapy Plus Alpha Hydroxy Crme (terapia Eucerin contra la piel seca con crema de alfahidroxicidos)  Eucerin Dry Skin Therapy Plus Alpha Hydroxy Lotion (locin Eucerin terapia contra la piel seca con alfahidroxicidos)  Eucerin Original Crme (crema Eucerin original)  Eucerin Original Lotion (locin Eucerin original)  Eucerin Plus Crme Eucerin Plus Lotion (locin o crema Eucerin Plus)  Eucerin TriLipid Replenishing Lotion (locin Eucerin rejuvenecedora Trilipid)  Keri Anti-Bacterial Hand Lotion (locin Keri antibacteriana para las manos)  Keri Deep Conditioning Original Lotion Dry Skin Formula Softly Scented (locin Keri original de hidratacin profunda, frmula para piel seca, con fragancia suave)  Keri Deep Conditioning Original Lotion, Fragrance Free Sensitive Skin Formula (locin Keri original de hidratacin profunda, frmula para piel sensible, sin fragancia)  Keri Lotion Fast Absorbing Fragrance Free Sensitive Skin Formula (locin Keri de absorcin rpida, frmula para piel sensible, sin fragancia)  Keri Lotion Fast Absorbing Softly Scented Dry Skin Formula (locin Keri de absorcin  rpida, frmula para piel seca, con fragancia suave)  Keri Original Lotion (locin Keri original)  Keri Skin Renewal Lotion Keri Silky Smooth Lotion (lociones Keri de renovacin drmica y sedosamente suave)  Keri Silky Smooth Sensitive Skin Lotion (locin Keri sedosamente suave para piel sensible)  Nivea Body Creamy Conditioning Oil (aceite Nivea cremoso hidratante para el cuerpo)  Company secretary Extra Enriched Lotion (locin Nivea extraenriquecida para el cuerpo)  Company secretary Original Lotion (locin Nivea original para el cuerpo)  Geneticist, molecular Crme (locin Company secretary [place] humectante y crema [place])  Nivea Skin Firming Lotion (locin Nivea reafirmante)  NutraDerm 30 Skin Lotion (locin NutraDerm 30 para la piel)  NutraDerm Skin Lotion (locin NutraDerm para la piel)  NutraDerm Therapeutic Skin Cream (crema NutraDerm teraputica para la piel)  NutraDerm Therapeutic Skin Lotion (locin NutraDerm teraputica para la piel)  ProShield Protective Hand Cream (crema ProShield protectora para las manos)  Provon moisturizing lotion (locin humectante Provon)          Pre-operative 5 CHG Bath Instructions   You can play a key role in reducing the risk of infection after surgery. Your skin needs to be as free of germs as possible. You can reduce the number of germs on your skin by washing with CHG (chlorhexidine gluconate) soap before surgery. CHG is an antiseptic soap that kills germs and continues to kill germs even after washing.   DO NOT use if you have an allergy to chlorhexidine/CHG or antibacterial soaps. If your skin becomes reddened or irritated, stop using the CHG and notify one of our RNs at [phone].   Please shower with the CHG soap starting 4 days before surgery using the following schedule:     Please keep in mind the following:  DO NOT shave, including legs and underarms, starting the day of your first shower.   You may shave your face at any  point before/day of surgery.  Place clean sheets on your bed the day you start using CHG soap. Use a clean washcloth (not used since being washed) for each shower. DO NOT sleep with pets once you start using the CHG.   CHG Shower Instructions:  If you choose to wash your hair and private area, wash first with your normal shampoo/soap.  After you use shampoo/soap, rinse your hair and body thoroughly to remove shampoo/soap residue.  Turn the water OFF and apply about 3 tablespoons (45 ml) of CHG soap to a CLEAN washcloth.  Apply CHG soap ONLY FROM YOUR NECK DOWN TO YOUR TOES (washing for 3-5 minutes)  DO NOT use CHG soap on face, private areas, open wounds, or sores.  Pay special attention to the area where your surgery is being performed.  If you are having back surgery, having someone wash your back for you may be helpful. Wait 2 minutes after CHG soap is applied, then you may rinse off the CHG soap.  Pat dry with a clean towel  Put on clean clothes/pajamas   If you choose to wear lotion, please use ONLY the CHG-compatible lotions on the back of this paper.     Additional instructions for the day of surgery: DO NOT APPLY any lotions, deodorants, cologne, or perfumes.   Put on clean/comfortable clothes.  Brush your teeth.  Ask your nurse before applying any prescription medications to the skin.      CHG Compatible Lotions   Aveeno Moisturizing lotion  Cetaphil Moisturizing Cream  Cetaphil Moisturizing Lotion  Clairol Herbal Essence Moisturizing Lotion, Dry Skin  Clairol Herbal Essence Moisturizing Lotion, Extra Dry Skin  Clairol Herbal Essence Moisturizing Lotion, Normal Skin  Curel Age Defying Therapeutic Moisturizing Lotion with Alpha Hydroxy  Curel Extreme Care Body Lotion  Curel Soothing Hands Moisturizing Hand Lotion  Curel Therapeutic Moisturizing Cream, Fragrance-Free  Curel Therapeutic Moisturizing Lotion, Fragrance-Free  Curel Therapeutic Moisturizing Lotion, Original  Formula  Eucerin Daily Replenishing Lotion  Eucerin Dry Skin Therapy Plus Alpha Hydroxy Crme  Eucerin Dry Skin Therapy Plus Alpha Hydroxy Lotion  Eucerin Original Crme  Eucerin Original Lotion  Eucerin Plus Crme  Eucerin Plus Lotion  Eucerin TriLipid Replenishing Lotion  Keri Anti-Bacterial Hand Lotion  Keri Deep Conditioning Original Lotion Dry Skin Formula Softly Scented  Keri Deep Conditioning Original Lotion, Fragrance Free Sensitive Skin Formula  Keri Lotion Fast Absorbing Fragrance Free Sensitive Skin Formula  Keri Lotion Fast Absorbing Softly Scented Dry Skin Formula  Keri Original Lotion  Keri Skin Renewal Lotion Keri Silky Smooth Lotion  Keri Silky Smooth Sensitive Skin Lotion  Nivea Body Creamy Conditioning Oil  Nivea Body Extra Enriched Teacher, adult education Moisturizing Lotion Nivea Crme  Nivea Skin Firming Lotion  NutraDerm 30 Skin Lotion  NutraDerm Skin Lotion  NutraDerm Therapeutic Skin Cream  NutraDerm Therapeutic Skin Lotion  ProShield Protective Hand Cream  Provon moisturizing lotion

## 2023-06-27 ENCOUNTER — Encounter: Payer: Self-pay | Admitting: Neurosurgery

## 2023-06-28 ENCOUNTER — Encounter: Payer: Self-pay | Admitting: Neurosurgery

## 2023-06-28 MED ORDER — CEFAZOLIN IN SODIUM CHLORIDE 2-0.9 GM/100ML-% IV SOLN
2.0000 g | Freq: Once | INTRAVENOUS | Status: DC
  Filled 2023-06-28: qty 100

## 2023-06-28 MED ORDER — SODIUM CHLORIDE 0.9 % IV SOLN: INTRAVENOUS | Status: DC

## 2023-06-28 MED ORDER — ORAL CARE MOUTH RINSE: 15.0000 mL | Freq: Once | OROMUCOSAL | Status: AC

## 2023-06-28 MED ORDER — CEFAZOLIN SODIUM-DEXTROSE 2-4 GM/100ML-% IV SOLN
2.0000 g | INTRAVENOUS | Status: AC
  Administered 2023-06-29: 2 g via INTRAVENOUS

## 2023-06-28 MED ORDER — CHLORHEXIDINE GLUCONATE 0.12 % MT SOLN
15.0000 mL | Freq: Once | OROMUCOSAL | Status: AC
  Administered 2023-06-29: 15 mL via OROMUCOSAL

## 2023-06-28 NOTE — Progress Notes (Signed)
Perioperative / Anesthesia Services  Pre-Admission Testing Clinical Review / Pre-Operative Anesthesia Consult  Date: 06/28/23  Patient Demographics:  Name: James Yates DOB: [birth year] MRN:   [id number]  Planned Surgical Procedure(s):    Case: [id number] Date/Time: 06/29/23 0816   Procedure: RIGHT L4-5 HEMILAMINECTOMY AND DISCECTOMY (Right)   Anesthesia type: General   Pre-op diagnosis:      M54.16 Lumbar radiculopathy     M47.816 Lumbar spondylosis   Location: [facility] OR ROOM 03 / [facility]   Surgeons: Lovenia Kim, MD      NOTE: Available PAT nursing documentation and vital signs have been reviewed. Clinical nursing staff has updated patient's PMH/PSHx, current medication list, and drug allergies/intolerances to ensure comprehensive history available to assist in medical decision making as it pertains to the aforementioned surgical procedure and anticipated anesthetic course. Extensive review of available clinical information personally performed. [place] PMH and PSHx updated with any diagnoses/procedures that  may have been inadvertently omitted during his intake with the pre-admission testing department's nursing staff.  Clinical Discussion:  James Yates is a 68 y.o. male who is submitted for pre-surgical anesthesia review and clearance prior to him undergoing the above procedure. Patient has never been a smoker in the past. Pertinent PMH includes: CAD (s/p CABG), NSTEMI, CHF, severe aortic stenosis due to bicuspid valve (s/p AVR), aortic atherosclerosis, BILATERAL carotid artery stenosis, RBBB, HTN, HLD, GERD (on daily PPI), cervical radiculopathy, lumbar spondylosis with radiculopathy.  Patient is followed by cardiology Kirke Corin, MD). He was last seen in the cardiology clinic on 06/16/2023; notes reviewed. At the time of his clinic visit, patient doing well overall from a cardiovascular perspective. Patient denied any chest pain, shortness of breath,  PND, orthopnea, palpitations, significant peripheral edema, weakness, fatigue, vertiginous symptoms, or presyncope/syncope. Patient with a past medical history significant for cardiovascular diagnoses. Documented physical exam was grossly benign, providing no evidence of acute exacerbation and/or decompensation of the patient's known cardiovascular conditions.  Patient suffered an NSTEMI on 07/02/2013.  Diagnostic LEFT heart catheterization was performed revealing multivessel CAD; 80% proximal LAD, 90% D1, 95% proximal LCx, 99% mid LCx, 90% OM 2, 60% OM3, 40% left atrioventricular groove artery, 50% proximal RCA, 40% distal RCA, and 80% RPL1.  Given the degree and complexity of his coronary artery disease, the decision was made to refer patient to CVTS for consideration of revascularization.  Patient underwent four-vessel revascularization (CABG) on 07/05/2013.  LIMA-LAD, SVG-RPDA, SVG-OM1, and SVG-OM3 bypass grafts were placed.  Stress test was performed on 10/19/2015 revealing a mildly reduced left ventricular systolic function with EF of 45-54%.  Patient was only able to exercise for 3 minutes due to fatigue, thus prompting change to Lexiscan (myocardial perfusion imaging). There was a large severe defect noted in the basal inferolateral, basal anterolateral, mid inferolateral, mid anterolateral, and apical lateral location findings consistent with ischemia in the LCX artery distribution.  Study determined to be high risk.  Repeat diagnostic LEFT heart catheterization was performed on 10/29/2015, again revealing multivessel CAD; 30% proximal RCA, 100% mid RCA, 90% ostial to mid LAD, 70% ostial D1-D1, 30% distal LAD, 100% ostial D2-D2, and 100% proximal LCx.  LIMA-LAD and SVG-PDA bypass grafts were patent.  There was complete occlusion of the SVG-OM 3.  LCx with left to left and right to left collateral formation.  Aggressive medical therapy was recommended.  Diagnostic RIGHT/LEFT heart catheterization  was performed on 11/27/2017 revealing multivessel CAD; 80% ostial-mid LAD, 30% distal LAD, 70% ostial D1-D1, 100%  ostial D2-D2, 30% proximal RCA, 100 send mid RCA, and a known 100% occlusion of the SVG-OM3.  Hemodynamic pressures consistent with patient's known moderate to severe aortic valve stenosis; mean transvalvular gradient = 26 mmHg with an of AVA (VTI) = 1.2 cm.  Hemodynamics: mean PA = 26 mmHg, mean PCWP = 16 mmHg, AO saturation 97%, CO 6.74 L/min, and CI 3.48 L/min/m.  Patient was referred back to CVTS for consideration of TAVR and additional one-vessel CABG to the OM1.  Patient found to have minimal carotid artery stenosis on Doppler study performed on 02/27/2018.  1-39% stenosis noted of the BILATERAL internal carotid arteries.  Vertebrals demonstrated antegrade flow hemodynamics.  Patient diagnosed with aortic valve stenosis in 12/2014, at which time degree of stenosis was noted to be moderate with a mean transvalvular gradient of 19; AVA (VTI) = 1.18.  Since the time of diagnosis, patient's cardiac function and degree of stenosis has been serially monitored.  Patient ultimately underwent TAVR procedure on 03/13/2018 placing a #23 Edwards SAPIEN 3 transcatheter heart valve via a percutaneous RIGHT transfemoral approach.  Long-term cardiac event monitor study performed on 03/31/2020 revealing a predominant underlying sinus rhythm at an average rate of 89 bpm.  There were 10 episodes of PSVT with the longest lasting 16 beats an average rate of 152.  Rare atrial and ventricular ectopy noted.  There were no significant arrhythmias or sustained pulses.  Patient triggered events corresponded with PVCs and sinus tachycardia.  Study was reassuring overall.  Repeat myocardial perfusion imaging study was performed on 02/10/2022 revealing a mildly reduced left ventricular systolic function with an EF of 46%.  There was a small fixed perfusion defect in the apical, distal anterior apical, and inferior  apical regions.  Cardiology unable to exclude small region prior MI.  CT attenuation correction images with mild aortic atherosclerosis and moderate coronary calcification.  Previously placed bio prosthetic aortic valve noted.  There was no evidence of significant stress-induced myocardial ischemia or arrhythmia.  Study determined to be low risk overall.   Most recent TTE was performed on 04/18/2022 revealing a normal left ventricular systolic function with an EF of 55 to 60%.  There were no regional wall motion abnormalities.  Diastolic Doppler parameters were indeterminate.  Average left ventricular GLS normal at -22.8%.  Right ventricular size and function normal.  Bioprosthetic aortic valve noted to be well-seated and functioning properly with a mean transvalvular gradient of 11.0 mmHg; AVA (VTI) 1.37 cm.  All other transvalvular gradients noted to be normal.  Aorta normal in size with no evidence of ectasia or aneurysmal dilatation.  Patient with recurrent anginal symptoms necessitating need for repeat diagnostic LEFT heart catheterization, which she had on 02/20/2023.  Angiography study revealed known multivessel CAD; 30% distal LAD, 80% ostial-mid LAD, 100% proximal-mid LCx, 30% proximal LCx, 100% mid RCA, 70% ostial D1-D1, and 100% ostial D2-D2.  LIMA-LAD and SVG-RPDA bypass grafts patent.  SVG-OM bypass graft chronically occluded.  Chronic occlusion in the LCx noted to be well collateralized with bridging and right to left collateral formation.  Cardiology team noted that there were no significant changes in the coronary anatomy since previous cardiac catheterization.  Given collateralization of the LCx, continued aggressive medical management was recommended.  Blood pressure reasonably controlled at 136/78 mmHg on currently prescribed CCB (amlodipine), ARB (losartan), and beta-blocker (metoprolol tartrate therapies.  Patient is on rosuvastatin for his HLD diagnosis and ASCVD prevention.  Patient has  a supply of short acting nitrates (NTG) to  use on a as needed basis for recurrent angina/anginal equivalent symptoms; denied recent use.  Patient is not diabetic. He does not have an OSAH diagnosis. Patient is able to complete all of his  ADL/IADLs without cardiovascular limitation.  Per the DASI, patient is able to achieve at least 4 METS of physical activity without experiencing any significant degree of angina/anginal equivalent symptoms. No changes were made to his medication regimen during his visit with cardiology.  Patient scheduled to follow-up with outpatient cardiology in 6 months or sooner if needed.  James Yates is scheduled for an elective RIGHT L4-5 HEMILAMINECTOMY AND DISCECTOMY (Right) on 06/29/2023 with Dr. Ernestine Mcmurray, MD.  Given patient's past medical history significant for cardiovascular diagnoses, presurgical cardiac clearance was sought by the PAT team.  Per cardiology, "patient is a LOW risk from a cardiac standpoint for upcoming back surgery.  No further ischemic cardiac evaluation needed prior to surgery.  May proceed as planned".  In review of the patient's chart, it is noted that he is on daily oral antithrombotic therapy.  Given his history of significant coronary artery disease, neurosurgery has cleared patient to continue his daily low-dose ASA throughout his perioperative course.  Patient has been updated on these recommendations by both the neurosurgery office and the PAT team.  Patient denies previous perioperative complications with anesthesia in the past. In review his EMR, it is noted that patient underwent a general anesthetic course at [facility] in 01/2020 without documented complications.      06/19/2023   10:04 AM 06/16/2023    9:41 AM 06/09/2023    9:02 AM  Vitals with BMI  Height 5\' 5"  5\' 5"  5\' 5"   Weight 198 lbs 3 oz 197 lbs 13 oz 198 lbs  BMI 32.98 32.92 32.95  Systolic 132 136 161  Diastolic 76 78  102  Pulse 70 73    Providers/Specialists:  NOTE: Primary physician provider listed below. Patient may have been seen by APP or partner within same practice.   PROVIDER ROLE / SPECIALTY LAST OV  Lovenia Kim, MD Neurosurgery (Surgeon) 06/09/2023  Glori Luis, MD Primary Care Provider 05/09/2023  Lorine Bears, MD Cardiology 06/16/2023  Edward Jolly, MD Pain Management 04/26/2023   Allergies:  No Known Allergies Current Home Medications:   No current facility-administered medications for this encounter.    amLODipine (NORVASC) 10 MG tablet   aspirin EC 81 MG tablet   gabapentin (NEURONTIN) 300 MG capsule   losartan (COZAAR) 100 MG tablet   metoprolol tartrate (LOPRESSOR) 100 MG tablet   nitroGLYCERIN (NITROSTAT) 0.4 MG SL tablet   pantoprazole (PROTONIX) 40 MG tablet   rosuvastatin (CRESTOR) 40 MG tablet   sertraline (ZOLOFT) 100 MG tablet   History:   Past Medical History:  Diagnosis Date   Aortic atherosclerosis (HCC)    Aortic stenosis due to bicuspid aortic valve    a.) TTE 01/26/15: mild-mod AS (MPG 19; VTI 1.18); b.) TTE 07/20/15: mild AS (MPG 13; VTI 1.06); c.) LHC 10/29/15: mod AS (peak-peak grad 22); d.) TTE 08/23/16: mod AS (MPG 21; VTI 0.93); e.) TTE 10/10/17: sev AS (MPG 38; VTI 0.6); f.) R/LHC 11/27/17: mod-sev AS (MPG 26; VTI 1.2); g.) TTE 02/27/18: mod AS (MPG 30; VTI 0.52); h.) TTE 03/13/18: sev AS (MPG 33; VTI 0.94); i.) s/p TAVR 03/13/2018   Bilateral carotid artery stenosis    a.) doppler 02/27/2018: 1-39% BICA   Cervical radiculopathy    CHF (congestive  heart failure) (HCC)    Coronary artery disease    a.) NSTEMI 07/02/2013 --> 4v CABG 07/05/2013 (LIMA-LAD, SVG-PDA, SVG-OM1, SVG-OM3); b.) MV 10/19/2015: LAD territory isch; c.) LHC 10/29/2015: 30% pRCA, 100% mRCA, 90% o-mLAD, 70% oD1-D1, 30% dLAD, 100% oD2-D2, 100% pLCx, 100% SVG-OM3 -- med mgmt; d.   GERD (gastroesophageal reflux disease)    Glucose intolerance (impaired glucose tolerance)     Headache    chronic, no red flag or atypical signs or symptoms   Hyperlipidemia    Hypertension    Lumbar radiculopathy    Lumbar spondylosis    NSTEMI (non-ST elevated myocardial infarction) (HCC) 07/02/2013   a.) LHC 07/02/2013: 80% pLAD, 90% D1, 95% pLCx, 99% mLCx, 90% OM2, 60% OM3, 40% left AV groove, 50% pRCA, 40% dRCA, 90% RPL1 --> CVTS; b.) s/p 4v CABG 07/05/2013   Obesity    Onychomycosis    RBBB (right bundle branch block)    S/P CABG x 4 07/05/2013   a.) LIMA-LAD, SVG-PDA, SVG-OM1, SVG-OM3   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   a.) 26 mm Edwards Sapien 3 transcatheter heart valve placed via percutaneous right transfemoral approach   Past Surgical History:  Procedure Laterality Date   CARDIAC CATHETERIZATION  06/30/2013   [facility]   CARDIAC CATHETERIZATION N/A 10/29/2015   Procedure: Left Heart Cath and Cors/Grafts Angiography;  Surgeon: Iran Ouch, MD;  Location: [facility] INVASIVE CV LAB;  Service: Cardiovascular;  Laterality: N/A;   CORONARY ARTERY BYPASS GRAFT N/A 07/05/2013   Procedure: CORONARY ARTERY BYPASS GRAFTING (CABG);  Surgeon: [name], MD;  [facility];  Service: Open Heart Surgery;  Laterality: N/A;   INTRAOPERATIVE TRANSESOPHAGEAL ECHOCARDIOGRAM N/A 07/05/2013   Procedure: INTRAOPERATIVE TRANSESOPHAGEAL ECHOCARDIOGRAM;  Surgeon: [name], MD;  Location: [facility] OR;  Service: Open Heart Surgery;  Laterality: N/A;   INTRAOPERATIVE TRANSTHORACIC ECHOCARDIOGRAM N/A 03/13/2018   Procedure: INTRAOPERATIVE TRANSTHORACIC ECHOCARDIOGRAM;  Surgeon: Kathleene Hazel, MD;  Location: [facility] OR;  Service: Open Heart Surgery;  Laterality: N/A;   LEFT HEART CATH AND CORS/GRAFTS ANGIOGRAPHY N/A 02/20/2023   Procedure: LEFT HEART CATH AND CORS/GRAFTS ANGIOGRAPHY;  Surgeon: Iran Ouch, MD;  Location: [facility] INVASIVE CV LAB;  Service: Cardiovascular;  Laterality: N/A;   RIGHT/LEFT HEART CATH AND CORONARY ANGIOGRAPHY Bilateral  11/27/2017   Procedure: RIGHT/LEFT HEART CATH AND CORONARY ANGIOGRAPHY;  Surgeon: Iran Ouch, MD;  Location: [facility] INVASIVE CV LAB;  Service: Cardiovascular;  Laterality: Bilateral;   TRANSCATHETER AORTIC VALVE REPLACEMENT, TRANSFEMORAL N/A 03/13/2018   Procedure: TRANSCATHETER AORTIC VALVE REPLACEMENT, TRANSFEMORAL. Edwards Sapien 3 Transcatheter Heart Valve size 26mm.;  Surgeon: Kathleene Hazel, MD;  Location: [facility] OR;  Service: Open Heart Surgery;  Laterality: N/A;   Family History  Problem Relation Age of Onset   Heart disease Father    Heart attack Father    Social History   Tobacco Use   Smoking status: Never   Smokeless tobacco: Never  Substance Use Topics   Alcohol use: No   Pertinent Clinical Results:  LABS:  Lab Results  Component Value Date   WBC 7.2 05/09/2023   HGB 15.2 05/09/2023   HCT 45.7 05/09/2023   MCV 89.5 05/09/2023   PLT 202.0 05/09/2023   Lab Results  Component Value Date   NA 143 05/09/2023   CL 103 05/09/2023   K 3.7 05/09/2023   CO2 29 05/09/2023   BUN 14 05/09/2023   CREATININE 0.88 05/09/2023   GFR 89.34 05/09/2023   CALCIUM 8.7  05/09/2023   ALBUMIN 4.6 05/09/2023   GLUCOSE 125 (H) 05/09/2023   Lab Results  Component Value Date   HGBA1C 6.8 (H) 01/04/2023     Component Date Value Ref Range Status   MRSA, PCR 06/19/2023 NEGATIVE  NEGATIVE Final   Staphylococcus aureus 06/19/2023 NEGATIVE  NEGATIVE Final   Comment: (NOTE) The Xpert SA Assay (FDA approved for NASAL specimens in patients 38 years of age and older), is one component of a comprehensive surveillance program. It is not intended to diagnose infection nor to guide or monitor treatment. Performed at [facility], [address] [place], [place] [address]    Color, Urine 06/19/2023 YELLOW (A)  YELLOW Final   APPearance 06/19/2023 HAZY (A)  CLEAR Final   Specific Gravity, Urine 06/19/2023 1.020  1.005 - 1.030 Final   pH 06/19/2023 5.0  5.0 - 8.0 Final    Glucose, UA 06/19/2023 NEGATIVE  NEGATIVE mg/dL Final   Hgb urine dipstick 06/19/2023 NEGATIVE  NEGATIVE Final   Bilirubin Urine 06/19/2023 NEGATIVE  NEGATIVE Final   Ketones, ur 06/19/2023 NEGATIVE  NEGATIVE mg/dL Final   Protein, ur 60/45/4098 30 (A)  NEGATIVE mg/dL Final   Nitrite 11/91/4782 NEGATIVE  NEGATIVE Final   Leukocytes,Ua 06/19/2023 NEGATIVE  NEGATIVE Final   RBC / HPF 06/19/2023 0-5  0 - 5 RBC/hpf Final   WBC, UA 06/19/2023 0-5  0 - 5 WBC/hpf Final   Bacteria, UA 06/19/2023 NONE SEEN  NONE SEEN Final   Squamous Epithelial / HPF 06/19/2023 0-5  0 - 5 /HPF Final   Mucus 06/19/2023 PRESENT   Final   Performed at [facility], [address] [place], [place] [address]   ABO/RH(D) 06/19/2023 B POS   Final   Antibody Screen 06/19/2023 NEG   Final   Sample Expiration 06/19/2023 07/03/2023,2359   Final   Extend sample reason 06/19/2023    Final                   Value:NO TRANSFUSIONS OR PREGNANCY IN THE PAST 3 MONTHS Performed at [facility], [address] [place], [place] [address]     ECG: Date: 06/16/2023  Time ECG obtained: 0950 AM Rate: 71 bpm Rhythm:  NSR; RBBB Axis (leads I and aVF): normal Intervals: PR 160 ms. QRS 138 ms. QTc 465 ms. ST segment and T wave changes: No evidence of acute T wave abnormalities or significant ST segment elevation or depression. Evidence of an age undetermined inferior infarct noted.  Comparison: Similar to previous tracing obtained on 03/16/2023   IMAGING / PROCEDURES: LEFT HEART CATHETERIZATION AND CORONARY ANGIOGRAPHY performed on 02/20/2023 Significant underlying three-vessel coronary artery disease with patent LIMA to LAD and patent SVG to right PDA.  Chronically occluded proximal left circumflex with bridging collaterals and right to left collaterals with known chronically occluded SVG to OM. 30% distal LAD  80% ostial to mid LAD 100% proximal to mid LCx with bridging and right to left collaterals 30%  proximal RCA 100% mid RCA 70% ostial D1-D1 100% ostial D2-D2 LIMA-LAD patent SVG-RPDA patent SVG-OM chronically occluded No significant gradient across the TAVR prosthesis by pullback. Left ventricular angiography was not performed.  EF was normal by echo.  High normal LVEDP at 13 mmHg. Recommendations: no significant change in coronary anatomy since most recent cardiac catheterization.  The left circumflex is well collateralized.  Recommend continuing medical therapy.   TRANSTHORACIC ECHOCARDIOGRAM performed on 04/18/2022 Left ventricular ejection fraction, by estimation, is 55 to 60%.  The left ventricle has normal function. The left ventricle has no regional wall motion abnormalities. Left ventricular diastolic parameters are indeterminate. The average left ventricular global longitudinal strain is -22.8 %. The global longitudinal strain is normal.  Right ventricular systolic function is low normal. The right ventricular size is normal.  The mitral valve is normal in structure. No evidence of mitral valve regurgitation.  The aortic valve has been repaired/replaced. Aortic valve regurgitation is not visualized. Echo findings are consistent with normal structure and function of the aortic valve prosthesis. Aortic valve mean gradient measures 11.0 mmHg.  The inferior vena cava is normal in size with greater than 50% respiratory variability, suggesting right atrial pressure of 3 mmHg.   MYOCARDIAL PERFUSION IMAGING STUDY (LEXISCAN) performed on 02/10/2022 Normal wall motion, EF estimated at 46%  Small region fixed defect in the apical, distal anterior apical, inferior apical region, unable to exclude small region prior MI  No EKG changes concerning for ischemia at peak stress or in recovery. CT attenuation correction images with mild aortic atherosclerosis and moderate coronary calcification, TAVR valve noted Low risk scan  LONG TERM CARDIAC EVENT MONITOR STUDY performed on  03/31/2020 Predominant rhythm of sinus with an average heart rate of 89 bpm.   There were 10 episodes of SVT with the longest episode lasting 16 beats with an average heart rate of 152 bpm.   Rare atrial and ventricular ectopy noted. Some of the patient triggered events correlated with PVCs and sinus tachycardia.   Overall reassuring study.  CT CORONARY MORPH W/CTA COR W/SCORE W/CA W/CM &/OR WO/CM performed on 12/14/2017 Trileaflet aortic valve with partially co-joined left and right leaflets. Leaflets are moderately thickened and calcified leaflets with moderately restricted leaflet excursions and no calcifications are extending into the LVOT. Annular measurements suitable for delivery of a 23 mm Edwards-SAPIEN 3 valve. Sufficient coronary to annulus distance. Optimum Fluoroscopic Angle for Delivery:  RAO 5 CAU 3 No thrombus in the left atrial appendage.  RIGHT/LEFT HEART CATHETERIZATION AND CORONARY ANGIOGRAPHY performed on 11/27/2017 Significant underlying three-vessel coronary artery disease with patent LIMA to LAD (could not be selectively engaged), patent SVG to RCA and known occluded SVG to OM 3.  Occluded native left circumflex. Moderate to severe aortic stenosis on current hemodynamics with a mean gradient of 26 mmHg and valve area of 1.2 centimeter squared.  However, his aortic stenosis is likely severe based on echocardiogram findings.  Aortic valve area today is overestimated due to high cardiac output.  Valve area by echocardiogram was 0.6 centimeter square.  Valve morphology is also consistent with severe stenosis. Right heart catheterization showed mildly elevated filling pressures with mild pulmonary hypertension. Mean PA = 26 mmHg Mean PCWP = 60 mmHg AO saturation = 97% CO = 6.74 L/min CI = 3.48 L/min/m Recommendations: aortic valve replacement +1 vessel CABG to OM1 (which is the biggest of OM branches).   CORONARY ARTERY BYPASS GRAFTING performed on 07/05/2013 Four-vessel  revascularization procedure LIMA-LAD  SVG-RPDA SVG-OM1 SVG-OM3  Impression and Plan:  Wenceslaus Gist has been referred for pre-anesthesia review and clearance prior to him undergoing the planned anesthetic and procedural courses. Available labs, pertinent testing, and imaging results were personally reviewed by me in preparation for upcoming operative/procedural course. [facility] medical record has been updated following extensive record review and patient interview with PAT staff.   This patient has been appropriately cleared by cardiology with an overall LOW risk of experiencing significant perioperative cardiovascular complications. Based on clinical review performed today (  06/28/23), barring any significant acute changes in the patient's overall condition, it is anticipated that he will be able to proceed with the planned surgical intervention. Any acute changes in clinical condition may necessitate his procedure being postponed and/or cancelled. Patient will meet with anesthesia team (MD and/or CRNA) on the day of his procedure for preoperative evaluation/assessment. Questions regarding anesthetic course will be fielded at that time.   Pre-surgical instructions were reviewed with the patient during his PAT appointment, and questions were fielded to satisfaction by PAT clinical staff. He has been instructed on which medications that he will need to hold prior to surgery, as well as the ones that have been deemed safe/appropriate to take on the day of his procedure. As part of the general education provided by PAT, patient made aware both verbally and in writing, that he would need to abstain from the use of any illegal substances during his perioperative course. He was advised that failure to follow the provided instructions could necessitate case cancellation or result in serious perioperative complications up to and including death. Patient encouraged to contact PAT and/or his surgeon's office  to discuss any questions or concerns that may arise prior to surgery; verbalized understanding.   Quentin Mulling, MSN, APRN, FNP-C, CEN [facility]  Perioperative Services Nurse Practitioner Phone: [phone] Fax: [phone] 06/28/23 10:47 AM  NOTE: This note has been prepared using Dragon dictation software. Despite my best ability to proofread, there is always the potential that unintentional transcriptional errors may still occur from this process.

## 2023-06-29 ENCOUNTER — Ambulatory Visit
Admission: RE | Admit: 2023-06-29 | Discharge: 2023-06-29 | Disposition: A | Discharge status: Home / Self Care | Payer: Medicare Other | Source: Ambulatory Visit | Attending: Neurosurgery | Admitting: Neurosurgery

## 2023-06-29 ENCOUNTER — Other Ambulatory Visit: Payer: Self-pay

## 2023-06-29 ENCOUNTER — Ambulatory Visit: Payer: Medicare Other

## 2023-06-29 ENCOUNTER — Ambulatory Visit: Payer: Medicare Other | Admitting: Urgent Care

## 2023-06-29 ENCOUNTER — Encounter
Admission: RE | Disposition: A | Discharge status: Home / Self Care | Payer: Self-pay | Source: Ambulatory Visit | Attending: Neurosurgery

## 2023-06-29 ENCOUNTER — Encounter: Payer: Self-pay | Admitting: Neurosurgery

## 2023-06-29 DIAGNOSIS — I25719 Atherosclerosis of autologous vein coronary artery bypass graft(s) with unspecified angina pectoris: Secondary | ICD-10-CM

## 2023-06-29 DIAGNOSIS — E669 Obesity, unspecified: Secondary | ICD-10-CM | POA: Insufficient documentation

## 2023-06-29 DIAGNOSIS — Z6832 Body mass index (BMI) 32.0-32.9, adult: Secondary | ICD-10-CM | POA: Insufficient documentation

## 2023-06-29 DIAGNOSIS — I509 Heart failure, unspecified: Secondary | ICD-10-CM | POA: Insufficient documentation

## 2023-06-29 DIAGNOSIS — G8929 Other chronic pain: Secondary | ICD-10-CM | POA: Diagnosis present

## 2023-06-29 DIAGNOSIS — I2510 Atherosclerotic heart disease of native coronary artery without angina pectoris: Secondary | ICD-10-CM | POA: Diagnosis not present

## 2023-06-29 DIAGNOSIS — I700 Atherosclerosis of aorta: Secondary | ICD-10-CM | POA: Diagnosis not present

## 2023-06-29 DIAGNOSIS — Z951 Presence of aortocoronary bypass graft: Secondary | ICD-10-CM | POA: Diagnosis not present

## 2023-06-29 DIAGNOSIS — Z952 Presence of prosthetic heart valve: Secondary | ICD-10-CM | POA: Diagnosis not present

## 2023-06-29 DIAGNOSIS — K219 Gastro-esophageal reflux disease without esophagitis: Secondary | ICD-10-CM | POA: Diagnosis not present

## 2023-06-29 DIAGNOSIS — M5416 Radiculopathy, lumbar region: Secondary | ICD-10-CM | POA: Diagnosis present

## 2023-06-29 DIAGNOSIS — I252 Old myocardial infarction: Secondary | ICD-10-CM | POA: Insufficient documentation

## 2023-06-29 DIAGNOSIS — M47816 Spondylosis without myelopathy or radiculopathy, lumbar region: Secondary | ICD-10-CM | POA: Diagnosis present

## 2023-06-29 DIAGNOSIS — Z01818 Encounter for other preprocedural examination: Secondary | ICD-10-CM

## 2023-06-29 DIAGNOSIS — I4891 Unspecified atrial fibrillation: Secondary | ICD-10-CM | POA: Insufficient documentation

## 2023-06-29 DIAGNOSIS — I110 Hypertensive heart disease with heart failure: Secondary | ICD-10-CM | POA: Insufficient documentation

## 2023-06-29 DIAGNOSIS — Z01812 Encounter for preprocedural laboratory examination: Secondary | ICD-10-CM

## 2023-06-29 HISTORY — DX: Occlusion and stenosis of bilateral carotid arteries: I65.23

## 2023-06-29 HISTORY — PX: LUMBAR LAMINECTOMY/DECOMPRESSION MICRODISCECTOMY: SHX5026

## 2023-06-29 HISTORY — DX: Unspecified right bundle-branch block: I45.10

## 2023-06-29 HISTORY — DX: Atherosclerosis of aorta: I70.0

## 2023-06-29 LAB — GLUCOSE, CAPILLARY
Glucose-Capillary: 121 mg/dL — ABNORMAL HIGH (ref 70–99)
Glucose-Capillary: 153 mg/dL — ABNORMAL HIGH (ref 70–99)

## 2023-06-29 SURGERY — LUMBAR LAMINECTOMY/DECOMPRESSION MICRODISCECTOMY 1 LEVEL
Anesthesia: General | Site: Back | Laterality: Right

## 2023-06-29 MED ORDER — DEXAMETHASONE SODIUM PHOSPHATE 10 MG/ML IJ SOLN
INTRAMUSCULAR | Status: AC
  Filled 2023-06-29: qty 1

## 2023-06-29 MED ORDER — FENTANYL CITRATE (PF) 100 MCG/2ML IJ SOLN
INTRAMUSCULAR | Status: AC
  Filled 2023-06-29: qty 2

## 2023-06-29 MED ORDER — EPHEDRINE 5 MG/ML INJ
INTRAVENOUS | Status: AC
  Filled 2023-06-29: qty 5

## 2023-06-29 MED ORDER — SODIUM CHLORIDE (PF) 0.9 % IJ SOLN: INTRAMUSCULAR | Status: DC | PRN

## 2023-06-29 MED ORDER — ROCURONIUM BROMIDE 10 MG/ML (PF) SYRINGE
PREFILLED_SYRINGE | INTRAVENOUS | Status: AC
  Filled 2023-06-29: qty 10

## 2023-06-29 MED ORDER — CYCLOBENZAPRINE HCL 5 MG PO TABS
5.0000 mg | ORAL_TABLET | Freq: Once | ORAL | Status: AC
  Administered 2023-06-29: 5 mg via ORAL
  Filled 2023-06-29: qty 1

## 2023-06-29 MED ORDER — ALBUTEROL SULFATE HFA 108 (90 BASE) MCG/ACT IN AERS
INHALATION_SPRAY | RESPIRATORY_TRACT | Status: DC | PRN
  Administered 2023-06-29: 6 via RESPIRATORY_TRACT

## 2023-06-29 MED ORDER — PROPOFOL 10 MG/ML IV BOLUS
INTRAVENOUS | Status: DC | PRN
  Administered 2023-06-29: 150 mg via INTRAVENOUS

## 2023-06-29 MED ORDER — LIDOCAINE HCL (CARDIAC) PF 100 MG/5ML IV SOSY
PREFILLED_SYRINGE | INTRAVENOUS | Status: DC | PRN
  Administered 2023-06-29: 100 mg via INTRAVENOUS

## 2023-06-29 MED ORDER — SURGIFLO WITH THROMBIN (HEMOSTATIC MATRIX KIT) OPTIME
TOPICAL | Status: DC | PRN
  Administered 2023-06-29: 1 via TOPICAL

## 2023-06-29 MED ORDER — ASPIRIN EC 81 MG PO TBEC: 81.0000 mg | DELAYED_RELEASE_TABLET | Freq: Every day | ORAL | Status: AC

## 2023-06-29 MED ORDER — SUGAMMADEX SODIUM 200 MG/2ML IV SOLN
INTRAVENOUS | Status: DC | PRN
  Administered 2023-06-29: 178.8 mg via INTRAVENOUS

## 2023-06-29 MED ORDER — PHENYLEPHRINE HCL-NACL 20-0.9 MG/250ML-% IV SOLN
INTRAVENOUS | Status: DC | PRN
  Administered 2023-06-29: 30 ug/min via INTRAVENOUS

## 2023-06-29 MED ORDER — PHENYLEPHRINE HCL-NACL 20-0.9 MG/250ML-% IV SOLN
INTRAVENOUS | Status: AC
  Filled 2023-06-29: qty 250

## 2023-06-29 MED ORDER — BUPIVACAINE-EPINEPHRINE (PF) 0.5% -1:200000 IJ SOLN
INTRAMUSCULAR | Status: DC | PRN
  Administered 2023-06-29: 7 mL

## 2023-06-29 MED ORDER — PHENYLEPHRINE 80 MCG/ML (10ML) SYRINGE FOR IV PUSH (FOR BLOOD PRESSURE SUPPORT)
PREFILLED_SYRINGE | INTRAVENOUS | Status: AC
  Filled 2023-06-29: qty 10

## 2023-06-29 MED ORDER — HYDROMORPHONE HCL 1 MG/ML IJ SOLN
INTRAMUSCULAR | Status: AC
  Filled 2023-06-29: qty 1

## 2023-06-29 MED ORDER — SODIUM CHLORIDE FLUSH 0.9 % IV SOLN
INTRAVENOUS | Status: AC
  Filled 2023-06-29: qty 20

## 2023-06-29 MED ORDER — EPHEDRINE SULFATE-NACL 50-0.9 MG/10ML-% IV SOSY
PREFILLED_SYRINGE | INTRAVENOUS | Status: DC | PRN
  Administered 2023-06-29 (×2): 5 mg via INTRAVENOUS

## 2023-06-29 MED ORDER — SUCCINYLCHOLINE CHLORIDE 200 MG/10ML IV SOSY
PREFILLED_SYRINGE | INTRAVENOUS | Status: DC | PRN
  Administered 2023-06-29: 100 mg via INTRAVENOUS

## 2023-06-29 MED ORDER — BUPIVACAINE LIPOSOME 1.3 % IJ SUSP
INTRAMUSCULAR | Status: AC
  Filled 2023-06-29: qty 40

## 2023-06-29 MED ORDER — ONDANSETRON HCL 4 MG/2ML IJ SOLN
INTRAMUSCULAR | Status: AC
  Filled 2023-06-29: qty 2

## 2023-06-29 MED ORDER — METHYLPREDNISOLONE ACETATE 40 MG/ML IJ SUSP
INTRAMUSCULAR | Status: AC
  Filled 2023-06-29: qty 2

## 2023-06-29 MED ORDER — PROPOFOL 10 MG/ML IV BOLUS
INTRAVENOUS | Status: AC
  Filled 2023-06-29: qty 20

## 2023-06-29 MED ORDER — OXYCODONE HCL 5 MG PO TABS
5.0000 mg | ORAL_TABLET | Freq: Once | ORAL | Status: AC | PRN
  Administered 2023-06-29: 5 mg via ORAL

## 2023-06-29 MED ORDER — MIDAZOLAM HCL 2 MG/2ML IJ SOLN
INTRAMUSCULAR | Status: DC | PRN
  Administered 2023-06-29: 2 mg via INTRAVENOUS

## 2023-06-29 MED ORDER — FENTANYL CITRATE (PF) 100 MCG/2ML IJ SOLN
INTRAMUSCULAR | Status: DC | PRN
  Administered 2023-06-29: 50 ug via INTRAVENOUS

## 2023-06-29 MED ORDER — PHENYLEPHRINE 80 MCG/ML (10ML) SYRINGE FOR IV PUSH (FOR BLOOD PRESSURE SUPPORT)
PREFILLED_SYRINGE | INTRAVENOUS | Status: DC | PRN
  Administered 2023-06-29: 120 ug via INTRAVENOUS
  Administered 2023-06-29 (×2): 80 ug via INTRAVENOUS

## 2023-06-29 MED ORDER — OXYCODONE HCL 5 MG PO TABS: 5.0000 mg | ORAL_TABLET | ORAL | 0 refills | Status: DC | PRN

## 2023-06-29 MED ORDER — CYCLOBENZAPRINE HCL 5 MG PO TABS: 5.0000 mg | ORAL_TABLET | Freq: Three times a day (TID) | ORAL | 0 refills | Status: DC | PRN

## 2023-06-29 MED ORDER — ALBUTEROL SULFATE HFA 108 (90 BASE) MCG/ACT IN AERS
INHALATION_SPRAY | RESPIRATORY_TRACT | Status: AC
  Filled 2023-06-29: qty 6.7

## 2023-06-29 MED ORDER — VASOPRESSIN 20 UNIT/ML IV SOLN
INTRAVENOUS | Status: AC
  Filled 2023-06-29: qty 1

## 2023-06-29 MED ORDER — CEFAZOLIN SODIUM-DEXTROSE 2-4 GM/100ML-% IV SOLN
INTRAVENOUS | Status: AC
  Filled 2023-06-29: qty 100

## 2023-06-29 MED ORDER — CHLORHEXIDINE GLUCONATE 0.12 % MT SOLN
OROMUCOSAL | Status: AC
  Filled 2023-06-29: qty 15

## 2023-06-29 MED ORDER — ONDANSETRON HCL 4 MG/2ML IJ SOLN
INTRAMUSCULAR | Status: DC | PRN
  Administered 2023-06-29: 4 mg via INTRAVENOUS

## 2023-06-29 MED ORDER — SODIUM CHLORIDE (PF) 0.9 % IJ SOLN
INTRAMUSCULAR | Status: DC | PRN
  Administered 2023-06-29: 25 mL

## 2023-06-29 MED ORDER — MIDAZOLAM HCL 2 MG/2ML IJ SOLN
INTRAMUSCULAR | Status: AC
  Filled 2023-06-29: qty 2

## 2023-06-29 MED ORDER — OXYCODONE HCL 5 MG PO TABS
ORAL_TABLET | ORAL | Status: AC
  Filled 2023-06-29: qty 1

## 2023-06-29 MED ORDER — BUPIVACAINE HCL (PF) 0.5 % IJ SOLN
INTRAMUSCULAR | Status: AC
  Filled 2023-06-29: qty 60

## 2023-06-29 MED ORDER — DEXAMETHASONE SODIUM PHOSPHATE 10 MG/ML IJ SOLN
INTRAMUSCULAR | Status: DC | PRN
  Administered 2023-06-29: 10 mg via INTRAVENOUS

## 2023-06-29 MED ORDER — ROCURONIUM BROMIDE 100 MG/10ML IV SOLN
INTRAVENOUS | Status: DC | PRN
  Administered 2023-06-29: 40 mg via INTRAVENOUS

## 2023-06-29 MED ORDER — HYDROMORPHONE HCL 1 MG/ML IJ SOLN
0.2500 mg | INTRAMUSCULAR | Status: DC | PRN
  Administered 2023-06-29 (×4): 0.5 mg via INTRAVENOUS

## 2023-06-29 MED ORDER — METHYLPREDNISOLONE ACETATE 40 MG/ML IJ SUSP
INTRAMUSCULAR | Status: DC | PRN
  Administered 2023-06-29: 40 mg

## 2023-06-29 MED ORDER — BUPIVACAINE-EPINEPHRINE (PF) 0.5% -1:200000 IJ SOLN
INTRAMUSCULAR | Status: AC
  Filled 2023-06-29: qty 60

## 2023-06-29 MED ORDER — OXYCODONE HCL 5 MG/5ML PO SOLN: 5.0000 mg | Freq: Once | ORAL | Status: AC | PRN

## 2023-06-29 SURGICAL SUPPLY — 31 items
BASIN KIT SINGLE STR (MISCELLANEOUS) ×1 IMPLANT
BRUSH SCRUB EZ 4% CHG (MISCELLANEOUS) ×1 IMPLANT
BUR NEURO DRILL SOFT 3.0X3.8M (BURR) ×1 IMPLANT
DERMABOND ADVANCED .7 DNX12 (GAUZE/BANDAGES/DRESSINGS) ×1 IMPLANT
DRAPE C-ARM XRAY 36X54 (DRAPES) ×2 IMPLANT
DRAPE LAPAROTOMY 100X77 ABD (DRAPES) ×1 IMPLANT
DRAPE MICROSCOPE SPINE 48X150 (DRAPES) ×1 IMPLANT
DRSG OPSITE POSTOP 3X4 (GAUZE/BANDAGES/DRESSINGS) ×1 IMPLANT
DRSG TEGADERM 4X4.75 (GAUZE/BANDAGES/DRESSINGS) ×1 IMPLANT
ELECT EZSTD 165MM 6.5IN (MISCELLANEOUS) ×1
ELECT REM PT RETURN 9FT ADLT (ELECTROSURGICAL) ×1
ELECTRODE EZSTD 165MM 6.5IN (MISCELLANEOUS) ×1 IMPLANT
ELECTRODE REM PT RTRN 9FT ADLT (ELECTROSURGICAL) ×1 IMPLANT
GLOVE BIOGEL PI IND STRL 8 (GLOVE) ×2 IMPLANT
GLOVE SURG SYN 7.5 E (GLOVE) ×1
GLOVE SURG SYN 7.5 PF PI (GLOVE) ×1 IMPLANT
GOWN SRG XL LVL 3 NONREINFORCE (GOWNS) ×1 IMPLANT
GOWN STRL REUS W/ TWL LRG LVL3 (GOWN DISPOSABLE) ×1 IMPLANT
KIT WILSON FRAME (KITS) ×1 IMPLANT
KNIFE BAYONET SHORT DISCETOMY (MISCELLANEOUS) IMPLANT
NDL SAFETY ECLIPSE 18X1.5 (NEEDLE) ×1 IMPLANT
NS IRRIG 500ML POUR BTL (IV SOLUTION) ×1 IMPLANT
PACK LAMINECTOMY ARMC (PACKS) ×1 IMPLANT
PAD ARMBOARD 7.5X6 YLW CONV (MISCELLANEOUS) ×2 IMPLANT
SURGIFLO W/THROMBIN 8M KIT (HEMOSTASIS) ×1 IMPLANT
SUT STRATA 3-0 15 PS-2 (SUTURE) ×1 IMPLANT
SUT VIC AB 0 CT1 27XCR 8 STRN (SUTURE) ×1 IMPLANT
SUT VIC AB 2-0 CT1 18 (SUTURE) ×1 IMPLANT
SYR 30ML LL (SYRINGE) ×2 IMPLANT
SYR 3ML LL SCALE MARK (SYRINGE) ×1 IMPLANT
TRAP FLUID SMOKE EVACUATOR (MISCELLANEOUS) ×1 IMPLANT

## 2023-06-29 NOTE — Anesthesia Procedure Notes (Signed)
Procedure Name: Intubation Date/Time: 06/29/2023 8:47 AM  Performed by: Emeterio Reeve, CRNAPre-anesthesia Checklist: Patient identified, Emergency Drugs available, Suction available and Patient being monitored Patient Re-evaluated:Patient Re-evaluated prior to induction Oxygen Delivery Method: Circle system utilized Preoxygenation: Pre-oxygenation with 100% oxygen Induction Type: IV induction Ventilation: Mask ventilation without difficulty Laryngoscope Size: McGrath and 4 Grade View: Grade I Tube type: Oral Number of attempts: 1 Airway Equipment and Method: Stylet and Oral airway Placement Confirmation: ETT inserted through vocal cords under direct vision, positive ETCO2 and breath sounds checked- equal and bilateral Secured at: 22 cm Tube secured with: Tape Dental Injury: Teeth and Oropharynx as per pre-operative assessment  Comments: Cords clear; neutral c-spine maintained throughout airway instrumentation. CA

## 2023-06-29 NOTE — Transfer of Care (Signed)
Immediate Anesthesia Transfer of Care Note  Patient: James Yates  Procedure(s) Performed: RIGHT L4-5 HEMILAMINECTOMY AND DISCECTOMY (Right: Back)  Patient Location: PACU  Anesthesia Type:General  Level of Consciousness: drowsy and patient cooperative  Airway & Oxygen Therapy: Patient Spontanous Breathing and Patient connected to face mask oxygen  Post-op Assessment: Report given to RN and Post -op Vital signs reviewed and stable  Post vital signs: stable  Last Vitals:  Vitals Value Taken Time  BP 131/80 06/29/23 1030  Temp    Pulse 82 06/29/23 1031  Resp 20 06/29/23 1031  SpO2 99 % 06/29/23 1031  Vitals shown include unfiled device data.  Last Pain:  Vitals:   06/29/23 0749  TempSrc: Temporal  PainSc: 8          Complications: No notable events documented.

## 2023-06-29 NOTE — Discharge Instructions (Signed)
Your surgeon has performed an operation on your lumbar spine (low back) to relieve pressure on one or more nerves. Many times, patients feel better immediately after surgery and can "overdo it." Even if you feel well, it is important that you follow these activity guidelines. If you do not let your back heal properly from the surgery, you can increase the chance of a disc herniation and/or return of your symptoms. The following are instructions to help in your recovery once you have been discharged from the hospital.  * It is ok to take NSAIDs after surgery.  Activity    No bending, lifting, or twisting ("BLT"). Avoid lifting objects heavier than 10 pounds (gallon milk jug).  Where possible, avoid household activities that involve lifting, bending, pushing, or pulling such as laundry, vacuuming, grocery shopping, and childcare. Try to arrange for help from friends and family for these activities while your back heals.  Increase physical activity slowly as tolerated.  Taking short walks is encouraged, but avoid strenuous exercise. Do not jog, run, bicycle, lift weights, or participate in any other exercises unless specifically allowed by your doctor. Avoid prolonged sitting, including car rides.  Talk to your doctor before resuming sexual activity.  You should not drive until cleared by your doctor.  Until released by your doctor, you should not return to work or school.  You should rest at home and let your body heal.   You may shower three days after your surgery.  After showering, lightly dab your incision dry. Do not take a tub bath or go swimming for 3 weeks, or until approved by your doctor at your follow-up appointment.  If you smoke, we strongly recommend that you quit.  Smoking has been proven to interfere with normal healing in your back and will dramatically reduce the success rate of your surgery. Please contact [facility] (800-QUIT-NOW) and use the resources at www.[facility].com for  assistance in stopping smoking.  Surgical Incision   If you have a dressing on your incision, you may remove it three days after your surgery. Keep your incision area clean and dry.  If you have staples or stitches on your incision, you should have a follow up scheduled for removal. If you do not have staples or stitches, you will have steri-strips (small pieces of surgical tape) or Dermabond glue. The steri-strips/glue should begin to peel away within about a week (it is fine if the steri-strips fall off before then). If the strips are still in place one week after your surgery, you may gently remove them.  Diet            You may return to your usual diet. Be sure to stay hydrated.  When to Contact us  Although your surgery and recovery will likely be uneventful, you may have some residual numbness, aches, and pains in your back and/or legs. This is normal and should improve in the next few weeks.  However, should you experience any of the following, contact us immediately: New numbness or weakness Pain that is progressively getting worse, and is not relieved by your pain medications or rest Bleeding, redness, swelling, pain, or drainage from surgical incision Chills or flu-like symptoms Fever greater than 101.0 F (38.3 C) Problems with bowel or bladder functions Difficulty breathing or shortness of breath Warmth, tenderness, or swelling in your calf  Contact Information How to contact us:  If you have any questions/concerns before or after surgery, you can reach [place] at [phone], or you can  send a [employer]. We can be reached by phone or mychart 8am-4pm, Monday-Friday.  *Please note: Calls after 4pm are forwarded to a third party answering service. Mychart messages are not routinely monitored during evenings, weekends, and holidays. Please call our office to contact the answering service for urgent concerns during non-business hours.

## 2023-06-29 NOTE — Anesthesia Postprocedure Evaluation (Signed)
Anesthesia Post Note  Patient: James Yates  Procedure(s) Performed: RIGHT L4-5 HEMILAMINECTOMY AND DISCECTOMY (Right: Back)  Patient location during evaluation: PACU Anesthesia Type: General Level of consciousness: awake and alert Pain management: pain level controlled Vital Signs Assessment: post-procedure vital signs reviewed and stable Respiratory status: spontaneous breathing, nonlabored ventilation, respiratory function stable and patient connected to nasal cannula oxygen Cardiovascular status: blood pressure returned to baseline and stable Postop Assessment: no apparent nausea or vomiting Anesthetic complications: no   No notable events documented.   Last Vitals:  Vitals:   06/29/23 1149 06/29/23 1151  BP:    Pulse:    Resp: 12 12  Temp:    SpO2:      Last Pain:  Vitals:   06/29/23 1145  TempSrc:   PainSc: Asleep                 Louie Boston

## 2023-06-29 NOTE — Interval H&P Note (Signed)
History and Physical Interval Note:  06/29/2023 8:25 AM  James Yates  has presented today for surgery, with the diagnosis of M54.16 Lumbar radiculopathy M47.816 Lumbar spondylosis.  The various methods of treatment have been discussed with the patient and family. After consideration of risks, benefits and other options for treatment, the patient has consented to  Procedure(s): RIGHT L4-5 HEMILAMINECTOMY AND DISCECTOMY (Right) as a surgical intervention.  The patient's history has been reviewed, patient examined, no change in status, stable for surgery.  I have reviewed the patient's chart and labs.  Questions were answered to the patient's satisfaction.    Heart and lungs clear  Lovenia Kim

## 2023-06-29 NOTE — Op Note (Signed)
Indications: [name] is suffering from lumbar radiculopathy. The patient tried and failed conservative management, prompting surgical intervention.  Findings: Ligamentum overgrowth, medial facet overgrowth and small disk bulge   Preoperative Diagnosis: Lumbar radiculopathy (ICD-10 M54.16) Postoperative Diagnosis: same   EBL: 20 ml IVF: see anesthesia record Drains: none Disposition: Extubated and Stable to PACU Complications: none  No foley catheter was placed.   Preoperative Note:   Risks of surgery discussed include: infection, bleeding, stroke, coma, death, paralysis, CSF leak, nerve/spinal cord injury, numbness, tingling, weakness, complex regional pain syndrome, recurrent stenosis and/or disc herniation, vascular injury, development of instability, neck/back pain, need for further surgery, persistent symptoms, development of deformity, and the risks of anesthesia. The patient understood these risks and agreed to proceed.  Operative Note:   1) Right L4/5 microdiscectomy  The patient was then brought from the preoperative center with intravenous access established.  The patient underwent general anesthesia and endotracheal tube intubation, and was then rotated on the [place] rail top where all pressure points were appropriately padded.  The skin was then thoroughly cleansed.  Perioperative antibiotic prophylaxis was administered.  Sterile prep and drapes were then applied and a timeout was then observed.  C-arm was brought into the field under sterile conditions, and the L4-5 disc space identified and marked with an incision on the right 1cm lateral to midline.  Once this was complete a 2 cm incision was opened with the use of a #10 blade knife.  The Metrx tubes were sequentially advanced under lateral fluoroscopy until a 18 x 60 mm Metrx tube was placed over the facet and lamina and secured to the bed.    The microscope was then sterilely brought into the field and  muscle creep was hemostased with a bipolar and resected with a pituitary rongeur.  A Bovie extender was then used to expose the spinous process and lamina.  Careful attention was placed to not violate the facet capsule. A 3 mm matchstick drill bit was then used to make a hemi-laminotomy trough until the ligamentum flavum was exposed.  This was extended to the base of the spinous process.  Once this was complete and the underlying ligamentum flavum was visualized, the ligamentum was dissected with an up angle curette and resected with a #2 and #3 mm biting Kerrison.  The laminotomy opening was also expanded in similar fashion and hemostasis was obtained with Surgifoam and a patty as well as bone wax.  The rostral aspect of the caudal level of the lamina was also resected with a #2 biting Kerrison effort to further enhance exposure.  Once the underlying dura was visualized a Penfield 4 was then used to dissect and expose the traversing nerve root.  Once this was identified a nerve root retractor suction was used to mobilize this medially.  The venous plexus was hemostased with Surgifoam and light bipolar use.  A small penfied was then used to make a small annulotomy within the disc space and a small amount of disc space contents were noted to come through the annulus.    Once the thecal sac and nerve root were noted to be relaxed and under less tension the ball-tipped feeler was passed along the foramen distally to ensure no residual compression was noted.    Depo-Medrol was placed along the nerve root.  The area was irrigated. The tube system was then removed under microscopic visualization and hemostasis was obtained with a bipolar.    The fascial layer was reapproximated with  the use of a 0- Vicryl suture.  Subcutaneous tissue layer was reapproximated using 2-0 Vicryl suture.  3-0 monocryl was used on the skin. The skin was then cleansed and Dermabond was used to close the skin opening.  Patient was then  rotated back to the preoperative bed awakened from anesthesia and taken to recovery all counts are correct in this case.   I performed the entire procedure with the assistance of Joan Flores, [place] as an Designer, television/film set. An assistant was required for this procedure due to the complexity.  The assistant provided assistance in tissue manipulation and suction, and was required for the successful and safe performance of the procedure. I performed the critical portions of the procedure.   Lovenia Kim MD

## 2023-06-29 NOTE — Anesthesia Preprocedure Evaluation (Addendum)
Anesthesia Evaluation  Patient identified by MRN, date of birth, ID band Patient awake    Reviewed: Allergy & Precautions, NPO status , Patient's Chart, lab work & pertinent test results  History of Anesthesia Complications Negative for: history of anesthetic complications  Airway Mallampati: III  TM Distance: >3 FB Neck ROM: full    Dental no notable dental hx.    Pulmonary neg pulmonary ROS   Pulmonary exam normal        Cardiovascular hypertension, (-) angina + CAD, + Past MI, + CABG, + Peripheral Vascular Disease and +CHF  (-) DOE Normal cardiovascular exam+ dysrhythmias Atrial Fibrillation + Valvular Problems/Murmurs (s/p TAVR) AS      Neuro/Psych  Headaches PSYCHIATRIC DISORDERS Anxiety Depression     Neuromuscular disease    GI/Hepatic Neg liver ROS,GERD  Medicated,,  Endo/Other  negative endocrine ROS    Renal/GU      Musculoskeletal   Abdominal   Peds  Hematology negative hematology ROS (+)   Anesthesia Other Findings Past Medical History: No date: Aortic atherosclerosis (HCC) No date: Aortic stenosis due to bicuspid aortic valve     Comment:  a.) TTE 01/26/15: mild-mod AS (MPG 19; VTI 1.18); b.)               TTE 07/20/15: mild AS (MPG 13; VTI 1.06); c.) LHC               10/29/15: mod AS (peak-peak grad 22); d.) TTE 08/23/16:               mod AS (MPG 21; VTI 0.93); e.) TTE 10/10/17: sev AS (MPG               38; VTI 0.6); f.) R/LHC 11/27/17: mod-sev AS (MPG 26; VTI              1.2); g.) TTE 02/27/18: mod AS (MPG 30; VTI 0.52); h.)               TTE 03/13/18: sev AS (MPG 33; VTI 0.94); i.) s/p TAVR               03/13/2018 No date: Bilateral carotid artery stenosis     Comment:  a.) doppler 02/27/2018: 1-39% BICA No date: Cervical radiculopathy No date: CHF (congestive heart failure) (HCC) No date: Coronary artery disease     Comment:  a.) NSTEMI 07/02/2013 --> 4v CABG 07/05/2013 (LIMA-LAD,                SVG-PDA, SVG-OM1, SVG-OM3); b.) MV 10/19/2015: LAD               territory isch; c.) LHC 10/29/2015: 30% pRCA, 100% mRCA,               90% o-mLAD, 70% oD1-D1, 30% dLAD, 100% oD2-D2, 100% pLCx,              100% SVG-OM3 -- med mgmt; d. No date: GERD (gastroesophageal reflux disease) No date: Glucose intolerance (impaired glucose tolerance) No date: Headache     Comment:  chronic, no red flag or atypical signs or symptoms No date: Hyperlipidemia No date: Hypertension No date: Lumbar radiculopathy No date: Lumbar spondylosis 07/02/2013: NSTEMI (non-ST elevated myocardial infarction) [facility])     Comment:  a.) LHC 07/02/2013: 80% pLAD, 90% D1, 95% pLCx, 99%               mLCx, 90% OM2, 60% OM3, 40% left AV groove, 50% pRCA, 40%  dRCA, 90% RPL1 --> CVTS; b.) s/p 4v CABG 07/05/2013 No date: Obesity No date: Onychomycosis No date: RBBB (right bundle branch block) 07/05/2013: S/P CABG x 4     Comment:  a.) LIMA-LAD, SVG-PDA, SVG-OM1, SVG-OM3 03/13/2018: S/P TAVR (transcatheter aortic valve replacement)     Comment:  a.) 26 mm Edwards Sapien 3 transcatheter heart valve               placed via percutaneous right transfemoral approach  Past Surgical History: 06/30/2013: CARDIAC CATHETERIZATION     Comment:  [facility] 10/29/2015: CARDIAC CATHETERIZATION; N/A     Comment:  Procedure: Left Heart Cath and Cors/Grafts Angiography;               Surgeon: Iran Ouch, MD;  Location: [facility] INVASIVE               CV LAB;  Service: Cardiovascular;  Laterality: N/A; 07/05/2013: CORONARY ARTERY BYPASS GRAFT; N/A     Comment:  Procedure: CORONARY ARTERY BYPASS GRAFTING (CABG);                Surgeon: [name], MD;  Location: [facility] OR;  Service:              Open Heart Surgery;  Laterality: N/A; 07/05/2013: INTRAOPERATIVE TRANSESOPHAGEAL ECHOCARDIOGRAM; N/A     Comment:  Procedure: INTRAOPERATIVE TRANSESOPHAGEAL               ECHOCARDIOGRAM;  Surgeon: [name], MD;   Location:              [facility] OR;  Service: Open Heart Surgery;  Laterality: N/A; 03/13/2018: INTRAOPERATIVE TRANSTHORACIC ECHOCARDIOGRAM; N/A     Comment:  Procedure: INTRAOPERATIVE TRANSTHORACIC ECHOCARDIOGRAM;               Surgeon: Kathleene Hazel, MD;  Location: [facility] OR;                Service: Open Heart Surgery;  Laterality: N/A; 02/20/2023: LEFT HEART CATH AND CORS/GRAFTS ANGIOGRAPHY; N/A     Comment:  Procedure: LEFT HEART CATH AND CORS/GRAFTS ANGIOGRAPHY;               Surgeon: Iran Ouch, MD;  Location: [facility] INVASIVE               CV LAB;  Service: Cardiovascular;  Laterality: N/A; 11/27/2017: RIGHT/LEFT HEART CATH AND CORONARY ANGIOGRAPHY; Bilateral     Comment:  Procedure: RIGHT/LEFT HEART CATH AND CORONARY               ANGIOGRAPHY;  Surgeon: Iran Ouch, MD;  Location:               [facility] INVASIVE CV LAB;  Service: Cardiovascular;                Laterality: Bilateral; 03/13/2018: TRANSCATHETER AORTIC VALVE REPLACEMENT, TRANSFEMORAL; N/A     Comment:  Procedure: TRANSCATHETER AORTIC VALVE REPLACEMENT,               TRANSFEMORAL. Edwards Sapien 3 Transcatheter Heart Valve               size 26mm.;  Surgeon: Kathleene Hazel, MD;                Location: [facility] OR;  Service: Open Heart Surgery;                Laterality: N/A;  BMI    Body Mass Index: 32.78 kg/m  Reproductive/Obstetrics negative OB ROS                             Anesthesia Physical Anesthesia Plan  ASA: 3  Anesthesia Plan: General ETT   Post-op Pain Management: Toradol IV (intra-op)*, Ofirmev IV (intra-op)* and Dilaudid IV   Induction: Intravenous  PONV Risk Score and Plan: 2 and Ondansetron, Dexamethasone, Midazolam and Treatment may vary due to age or medical condition  Airway Management Planned: Oral ETT  Additional Equipment:   Intra-op Plan:   Post-operative Plan: Extubation in OR  Informed Consent: I have reviewed the patients History  and Physical, chart, labs and discussed the procedure including the risks, benefits and alternatives for the proposed anesthesia with the patient or authorized representative who has indicated his/her understanding and acceptance.     Dental Advisory Given  Plan Discussed with: Anesthesiologist, CRNA and Surgeon  Anesthesia Plan Comments: (Patient consented for risks of anesthesia including but not limited to:  - adverse reactions to medications - damage to eyes, teeth, lips or other oral mucosa - nerve damage due to positioning  - sore throat or hoarseness - Damage to heart, brain, nerves, lungs, other parts of body or loss of life  Patient voiced understanding and assent.)       Anesthesia Quick Evaluation

## 2023-06-30 ENCOUNTER — Encounter: Payer: Self-pay | Admitting: Neurosurgery

## 2023-07-05 ENCOUNTER — Ambulatory Visit: Payer: Medicare Other | Admitting: Student in an Organized Health Care Education/Training Program

## 2023-07-11 NOTE — Progress Notes (Unsigned)
   REFERRING PHYSICIAN:  Glori Luis, Md [address],  [place] [address]  Interpreter used as patient speaks spanish.***  DOS: 06/29/23 Right L4-L5 microdiscectomy  HISTORY OF PRESENT ILLNESS: James Yates is approximately 2 weeks status post above surgery. Was given flexeril and oxycodone on discharge from the hospital.      PHYSICAL EXAMINATION:  General: Patient is well developed, well nourished, calm, collected, and in no apparent distress.   NEUROLOGICAL:  General: In no acute distress.   Awake, alert, oriented to person, place, and time.  Pupils equal round and reactive to light.  Facial tone is symmetric.     Strength:            Side Iliopsoas Quads Hamstring PF DF EHL  R 5 5 5 5 5 5   L 5 5 5 5 5 5    Incision c/d/i   ROS (Neurologic):  Negative except as noted above  IMAGING: Nothing new to review.   ASSESSMENT/PLAN:  James Yates is doing well s/p above surgery. Treatment options reviewed with patient and following plan made:   - I have advised the patient to lift up to 10 pounds until 6 weeks after surgery (follow up with Dr. Myer Haff).  - Reviewed wound care.  - No bending, twisting, or lifting.  - Continue on current medications including ***.  - Follow up as scheduled in 4 weeks and prn.   Advised to contact the office if any questions or concerns arise.  Drake Leach PA-C Department of neurosurgery

## 2023-07-12 ENCOUNTER — Encounter: Payer: Self-pay | Admitting: Orthopedic Surgery

## 2023-07-12 ENCOUNTER — Ambulatory Visit (INDEPENDENT_AMBULATORY_CARE_PROVIDER_SITE_OTHER): Payer: Medicare Other | Admitting: Orthopedic Surgery

## 2023-07-12 VITALS — BP 124/84 | Temp 98.8°F | Ht 65.0 in | Wt 197.0 lb

## 2023-07-12 DIAGNOSIS — Z9889 Other specified postprocedural states: Secondary | ICD-10-CM

## 2023-07-12 DIAGNOSIS — M5416 Radiculopathy, lumbar region: Secondary | ICD-10-CM

## 2023-07-12 MED ORDER — OXYCODONE HCL 5 MG PO TABS: 5.0000 mg | ORAL_TABLET | Freq: Three times a day (TID) | ORAL | 0 refills | Status: DC | PRN

## 2023-07-14 ENCOUNTER — Other Ambulatory Visit: Payer: Self-pay | Admitting: Family Medicine

## 2023-07-14 DIAGNOSIS — F32A Depression, unspecified: Secondary | ICD-10-CM

## 2023-08-08 ENCOUNTER — Ambulatory Visit (INDEPENDENT_AMBULATORY_CARE_PROVIDER_SITE_OTHER): Payer: Medicare Other | Admitting: Nurse Practitioner

## 2023-08-08 ENCOUNTER — Telehealth: Payer: Self-pay | Admitting: Nurse Practitioner

## 2023-08-08 VITALS — BP 130/80 | HR 81 | Temp 98.4°F | Ht 65.0 in | Wt 198.4 lb

## 2023-08-08 DIAGNOSIS — R042 Hemoptysis: Secondary | ICD-10-CM

## 2023-08-08 DIAGNOSIS — I10 Essential (primary) hypertension: Secondary | ICD-10-CM | POA: Diagnosis not present

## 2023-08-08 DIAGNOSIS — Z9889 Other specified postprocedural states: Secondary | ICD-10-CM | POA: Diagnosis not present

## 2023-08-08 DIAGNOSIS — Z1329 Encounter for screening for other suspected endocrine disorder: Secondary | ICD-10-CM

## 2023-08-08 DIAGNOSIS — Z131 Encounter for screening for diabetes mellitus: Secondary | ICD-10-CM

## 2023-08-08 DIAGNOSIS — E785 Hyperlipidemia, unspecified: Secondary | ICD-10-CM

## 2023-08-08 DIAGNOSIS — F419 Anxiety disorder, unspecified: Secondary | ICD-10-CM

## 2023-08-08 DIAGNOSIS — I25719 Atherosclerosis of autologous vein coronary artery bypass graft(s) with unspecified angina pectoris: Secondary | ICD-10-CM

## 2023-08-08 DIAGNOSIS — K219 Gastro-esophageal reflux disease without esophagitis: Secondary | ICD-10-CM

## 2023-08-08 DIAGNOSIS — F32A Depression, unspecified: Secondary | ICD-10-CM

## 2023-08-08 MED ORDER — SERTRALINE HCL 100 MG PO TABS: 100.0000 mg | ORAL_TABLET | Freq: Every day | ORAL | 3 refills | Status: DC

## 2023-08-08 MED ORDER — PANTOPRAZOLE SODIUM 40 MG PO TBEC: 40.0000 mg | DELAYED_RELEASE_TABLET | Freq: Every day | ORAL | 3 refills | Status: DC

## 2023-08-08 MED ORDER — SERTRALINE HCL 50 MG PO TABS: 50.0000 mg | ORAL_TABLET | Freq: Every day | ORAL | 3 refills | Status: DC

## 2023-08-08 NOTE — Progress Notes (Unsigned)
 Bethanie Dicker, NP-C Phone: [phone]  James Yates is a 68 y.o. male who presents today for transfer of care.   Discussed the use of AI scribe software for clinical note transcription with the patient, who gave verbal consent to proceed.  History of Present Illness   The patient presents for transfer of care with back pain and hypertension.  He experiences back pain that is particularly severe in the mornings, making it difficult to get out of bed. The pain varies in intensity, sometimes being strong and other times mild. He had back surgery in January and has a follow-up appointment with neurosurgery scheduled for tomorrow. He has been prescribed pain medication for his back.  He monitors his blood pressure at home and notes that it is often high, typically around 150/97 or 140/97. He takes three blood pressure medications: losartan, metoprolol, and amlodipine. His blood pressure readings are lower when taken at the clinic compared to at home. He experiences dizziness, particularly at night or when transitioning from sitting to standing, and sometimes feels short of breath. No chest pain, but occasionally mild shortness of breath.  He experiences a sour taste in his mouth every morning, sometimes accompanied by a small amount of blood. He notices it some with small coughing episodes but mentions that he occasionally finds blood in his mouth upon waking. He has a history of epistaxis but does not believe the blood is coming from his nose. He experiences chills and feels cold at times but has not traveled recently.  He has a history of depression and takes Zoloft (sertraline) 100 mg daily. He feels that his depression is not well-managed and wants a dosage increase. He denies any thoughts of self-harm or harm to others but reports a lack of interest and motivation.      Social History   Tobacco Use  Smoking Status Never  Smokeless Tobacco Never    Current Outpatient Medications on  File Prior to Visit  Medication Sig Dispense Refill   amLODipine (NORVASC) 10 MG tablet TOME 1 TABLETA POR VIA ORAL TODOS LOS DIAS 90 tablet 2   aspirin EC 81 MG tablet Take 1 tablet (81 mg total) by mouth daily. Resume 07/01/22     cyclobenzaprine (FLEXERIL) 5 MG tablet Take 1 tablet (5 mg total) by mouth 3 (three) times daily as needed for muscle spasms. 30 tablet 0   gabapentin (NEURONTIN) 300 MG capsule Take 1 capsule (300 mg total) by mouth at bedtime. 30 capsule 1   losartan (COZAAR) 100 MG tablet Take 1 tablet (100 mg total) by mouth daily. 90 tablet 3   metoprolol tartrate (LOPRESSOR) 100 MG tablet Take 1 tablet (100 mg total) by mouth 2 (two) times daily. 180 tablet 1   nitroGLYCERIN (NITROSTAT) 0.4 MG SL tablet Place 1 tablet (0.4 mg total) under the tongue every 5 (five) minutes as needed for chest pain. 30 tablet 1   oxyCODONE (ROXICODONE) 5 MG immediate release tablet Take 1 tablet (5 mg total) by mouth every 8 (eight) hours as needed for severe pain (pain score 7-10). 21 tablet 0   rosuvastatin (CRESTOR) 40 MG tablet Take 1 tablet (40 mg total) by mouth daily. 90 tablet 3   No current facility-administered medications on file prior to visit.     ROS see history of present illness  Objective  Physical Exam Vitals:   08/08/23 1407  BP: 130/80  Pulse: 81  Temp: 98.4 F (36.9 C)  SpO2: 97%    BP Readings  from Last 3 Encounters:  08/09/23 (!) 140/88  08/08/23 130/80  07/12/23 124/84   Wt Readings from Last 3 Encounters:  08/09/23 198 lb (89.8 kg)  08/08/23 198 lb 6.4 oz (90 kg)  07/12/23 197 lb (89.4 kg)    Physical Exam Constitutional:      General: He is not in acute distress.    Appearance: Normal appearance.  HENT:     Head: Normocephalic.     Mouth/Throat:     Mouth: Mucous membranes are moist.     Pharynx: Oropharynx is clear. No oropharyngeal exudate or posterior oropharyngeal erythema.  Cardiovascular:     Rate and Rhythm: Normal rate and regular  rhythm.     Heart sounds: Normal heart sounds.  Pulmonary:     Effort: Pulmonary effort is normal.     Breath sounds: Normal breath sounds.  Skin:    General: Skin is warm and dry.  Neurological:     General: No focal deficit present.     Mental Status: He is alert.  Psychiatric:        Mood and Affect: Mood normal.        Behavior: Behavior normal.    Assessment/Plan: Please see individual problem list.  Hemoptysis Assessment & Plan: He experiences recurrent hemoptysis, with a differential diagnosis including pulmonary or upper respiratory sources. A chest CT is ordered to evaluate potential causes. He is instructed to seek emergency care if there is a significant increase in the amount of blood being coughed up.  Orders: -     CT CHEST W CONTRAST; Future -     CBC with Differential/Platelet  S/P lumbar microdiscectomy Assessment & Plan: Chronic back pain exacerbated post-surgery and is disrupting his sleep and ability to stand. He is under neurosurgery care and will follow up with a neurosurgery appointment tomorrow for pain management and further evaluation.   HTN, goal below 140/90 Assessment & Plan: His hypertension is often elevated in home readings, with concerns about the accuracy of his home cuff. Blood pressure stable in office. He is on losartan 100 mg daily, metoprolol 100 mg twice daily, and amlodipine 5 mg daily. Dizziness may relate to blood pressure fluctuations. He should take blood pressure readings daily after resting and bring his home blood pressure cuff to the next appointment for comparison with office readings. A blood pressure log is provided for recording home readings. Continue current antihypertensive medications: losartan, metoprolol, and amlodipine. Return precautions given to patient. Lab work as outlined.   Orders: -     Comprehensive metabolic panel  Hyperlipidemia, unspecified hyperlipidemia type Assessment & Plan: Currently on Crestor 40 mg  daily and Zetia 10 mg daily. Continue. We will check lipid panel today. Follow up with Cardiology as scheduled.   Orders: -     Lipid panel -     LDL cholesterol, direct  Anxiety and depression Assessment & Plan: His depression symptoms are worsening. He is on Zoloft (sertraline) 100 mg daily and desires a dose adjustment. Increase Zoloft (sertraline) dose to 150 mg daily. Encouraged to contact if worsening symptoms, unusual behavior changes or suicidal thoughts occur.   Orders: -     Sertraline HCl; Take 1 tablet (100 mg total) by mouth daily.  Dispense: 90 tablet; Refill: 3 -     Sertraline HCl; Take 1 tablet (50 mg total) by mouth daily. Take with 100 mg tablet to total 150 mg daily  Dispense: 90 tablet; Refill: 3  Gastroesophageal reflux disease, unspecified whether esophagitis  present -     Pantoprazole Sodium; Take 1 tablet (40 mg total) by mouth daily.  Dispense: 90 tablet; Refill: 3  Diabetes mellitus screening -     Hemoglobin A1c  Thyroid disorder screen -     TSH    Return in about 3 months (around 11/08/2023), or if symptoms worsen or fail to improve, for Follow up.   Bethanie Dicker, NP-C [facility]

## 2023-08-08 NOTE — Telephone Encounter (Signed)
Vm full . thanks

## 2023-08-09 ENCOUNTER — Encounter: Payer: Self-pay | Admitting: Neurosurgery

## 2023-08-09 ENCOUNTER — Ambulatory Visit: Payer: Medicare Other | Admitting: Neurosurgery

## 2023-08-09 ENCOUNTER — Encounter: Payer: Self-pay | Admitting: Nurse Practitioner

## 2023-08-09 VITALS — BP 140/88 | Ht 65.0 in | Wt 198.0 lb

## 2023-08-09 DIAGNOSIS — Z9889 Other specified postprocedural states: Secondary | ICD-10-CM

## 2023-08-09 DIAGNOSIS — M5416 Radiculopathy, lumbar region: Secondary | ICD-10-CM

## 2023-08-09 LAB — CBC WITH DIFFERENTIAL/PLATELET
Basophils Absolute: 0 10*3/uL (ref 0.0–0.1)
Basophils Relative: 0.5 % (ref 0.0–3.0)
Eosinophils Absolute: 0.2 10*3/uL (ref 0.0–0.7)
Eosinophils Relative: 1.8 % (ref 0.0–5.0)
HCT: 47.7 % (ref 39.0–52.0)
Hemoglobin: 16.1 g/dL (ref 13.0–17.0)
Lymphocytes Relative: 18.3 % (ref 12.0–46.0)
Lymphs Abs: 1.6 10*3/uL (ref 0.7–4.0)
MCHC: 33.7 g/dL (ref 30.0–36.0)
MCV: 88 fl (ref 78.0–100.0)
Monocytes Absolute: 0.7 10*3/uL (ref 0.1–1.0)
Monocytes Relative: 8 % (ref 3.0–12.0)
Neutro Abs: 6.1 10*3/uL (ref 1.4–7.7)
Neutrophils Relative %: 71.4 % (ref 43.0–77.0)
Platelets: 198 10*3/uL (ref 150.0–400.0)
RBC: 5.42 Mil/uL (ref 4.22–5.81)
RDW: 13.2 % (ref 11.5–15.5)
WBC: 8.6 10*3/uL (ref 4.0–10.5)

## 2023-08-09 LAB — COMPREHENSIVE METABOLIC PANEL
ALT: 23 U/L (ref 0–53)
AST: 17 U/L (ref 0–37)
Albumin: 4.7 g/dL (ref 3.5–5.2)
Alkaline Phosphatase: 97 U/L (ref 39–117)
BUN: 14 mg/dL (ref 6–23)
CO2: 29 meq/L (ref 19–32)
Calcium: 9.1 mg/dL (ref 8.4–10.5)
Chloride: 100 meq/L (ref 96–112)
Creatinine, Ser: 0.85 mg/dL (ref 0.40–1.50)
GFR: 90.12 mL/min (ref >60.00)
Glucose, Bld: 81 mg/dL (ref 70–99)
Potassium: 3.3 meq/L — ABNORMAL LOW (ref 3.5–5.1)
Sodium: 139 meq/L (ref 135–145)
Total Bilirubin: 0.7 mg/dL (ref 0.2–1.2)
Total Protein: 7.6 g/dL (ref 6.0–8.3)

## 2023-08-09 LAB — HEMOGLOBIN A1C: Hgb A1c MFr Bld: 6.6 % — ABNORMAL HIGH (ref 4.6–6.5)

## 2023-08-09 LAB — LIPID PANEL
Cholesterol: 260 mg/dL — ABNORMAL HIGH (ref 0–200)
HDL: 44.1 mg/dL (ref >39.00)
NonHDL: 215.43
Total CHOL/HDL Ratio: 6
Triglycerides: 460 mg/dL — ABNORMAL HIGH (ref 0.0–149.0)
VLDL: 92 mg/dL — ABNORMAL HIGH (ref 0.0–40.0)

## 2023-08-09 LAB — TSH: TSH: 2.18 u[IU]/mL (ref 0.35–5.50)

## 2023-08-09 LAB — LDL CHOLESTEROL, DIRECT: Direct LDL: 141 mg/dL

## 2023-08-09 NOTE — Progress Notes (Signed)
   REFERRING PHYSICIAN:  Glori Luis, Md No address on file  Interpreter used as patient speaks spanish.  DOS: 06/29/23 Right L4-L5 microdiscectomy  HISTORY OF PRESENT ILLNESS: James Yates is approximately 6 weeks status post above surgery. Was given flexeril and oxycodone on discharge from the hospital.  He states that his leg pain has improved, however he continues to have back pain and cramping.  This does continue to bother him significantly although he is happy that his leg pain has improved.   PHYSICAL EXAMINATION:  General: Patient is well developed, well nourished, calm, collected, and in no apparent distress.   NEUROLOGICAL:  General: In no acute distress.   Awake, alert, oriented to person, place, and time.  Pupils equal round and reactive to light.  Facial tone is symmetric.     Strength:            Side Iliopsoas Quads Hamstring PF DF EHL  R 5 5 5 5 5 5   L 5 5 5 5 5 5    Incision c/d/i no rednes or drainage.   ROS (Neurologic):  Negative except as noted above  IMAGING: Nothing new to review.   ASSESSMENT/PLAN:  James Yates is doing well in regards to his radiculopathy, however he is having continued back pain  -I like him to get a x-ray of his spine to evaluate for any abnormalities or slippage which can occur after lumbar decompressions. - Reviewed wound care, no evidence of infection, patient states that he has not had any fever.  Given his pain we will plan on following up on a CBC to make sure he does not have any elevated white count which may indicate a reason for cross-sectional imaging. -Plan was reviewed with the patient as well as the interpreter.  I also think that he would benefit from physical therapy at this time given his back soreness after surgery.  Advised to contact the office if any questions or concerns arise.  Lovenia Kim, MD Department of neurosurgery

## 2023-08-09 NOTE — Assessment & Plan Note (Addendum)
 Chronic back pain exacerbated post-surgery and is disrupting his sleep and ability to stand. He is under neurosurgery care and will follow up with a neurosurgery appointment tomorrow for pain management and further evaluation.

## 2023-08-09 NOTE — Assessment & Plan Note (Signed)
 Currently on Crestor 40 mg daily and Zetia 10 mg daily. Continue. We will check lipid panel today. Follow up with Cardiology as scheduled.

## 2023-08-09 NOTE — Assessment & Plan Note (Addendum)
 His hypertension is often elevated in home readings, with concerns about the accuracy of his home cuff. Blood pressure stable in office. He is on losartan 100 mg daily, metoprolol 100 mg twice daily, and amlodipine 5 mg daily. Dizziness may relate to blood pressure fluctuations. He should take blood pressure readings daily after resting and bring his home blood pressure cuff to the next appointment for comparison with office readings. A blood pressure log is provided for recording home readings. Continue current antihypertensive medications: losartan, metoprolol, and amlodipine. Return precautions given to patient. Lab work as outlined.

## 2023-08-09 NOTE — Assessment & Plan Note (Signed)
 His depression symptoms are worsening. He is on Zoloft (sertraline) 100 mg daily and desires a dose adjustment. Increase Zoloft (sertraline) dose to 150 mg daily. Encouraged to contact if worsening symptoms, unusual behavior changes or suicidal thoughts occur.

## 2023-08-09 NOTE — Assessment & Plan Note (Signed)
 He experiences recurrent hemoptysis, with a differential diagnosis including pulmonary or upper respiratory sources. A chest CT is ordered to evaluate potential causes. He is instructed to seek emergency care if there is a significant increase in the amount of blood being coughed up.

## 2023-08-09 NOTE — Patient Instructions (Signed)
 LOCAL PHYSICAL THERAPY  [facility]  [address]  [place], [place] [address]  [phone]  [facility]  [address] [place], [place] [address]  [phone]  [facility] (2 locations)  [address]  [place], [place] [address]  [phone]          or  [address]  [place], [place] [address]  [phone]  [facility]  [address]  [place], [place] [address]  [phone]  **dry needling**  [facility])  [address]  [place], [place] [address]  [phone]  Fax: [phone]  ** Aquatic therapy[address] [place], [place] [address] [phone] **Aquatic therapy**  [facility] [address] [place], [place] [address] [phone]  [facility]  [address]  [place], [place] [address]  [phone]  [facility]  [address]  [place], [place] [address]  [phone]  [facility]  [address]   Janora Norlander  [place], [place] [address]  [phone]  [facility]  [address]  [place], [place] [address]  [phone]  **dry needling**   PELVIC FLOOR/SI JOINT  [facility]  Mariane Masters, PT  [email or web address]   [place]  [facility]  [address]  [address] [place], [place] [address]  [phone]   [facility] [place]  [address], [place] [address]  [phone]   [facility], [place]  [facility]  Raymond Gurney, PT  [address]  [place], [place] [address] [phone]   Samara Deist  [facility]  [address]  [phone]   [facility]  [address]  [phone]   [facility]  Chiropractic and Sports Recovery  [facility]nam[facility] Boots [facility]  [address]  [place], [place] [address]  [phone]  **No Aetna or medicaid**  [facility]  [phone] [address] [place] [address]  [phone]  [facility]  [address]  [place], [place] [address]  [phone]  Dannial Monarch, [place]  [address] [place], [place] [address]  [phone]  [facility]  [address] [place] [address]  [phone]  [facility]  [address]  [place], [place] [address]  [phone]  [facility]  (3 locations)  [address]  [place], [place] [address]  [phone]  **dry needling**           or  [address] [place], [place] [address]  [phone]  **Additionally has Gloris [place], OT**           or  [address]  [place], [place] [address]  [phone]  **Pediatric therapy**  [facility]  [address]  [phone]  **dry needlingVerdie [facility]  [address]  [place], [place] [address]  [phone]  [facility]   ([facility] [address] [facility])  [address]  [place], [place] [address]  [phone]  **dry needling**  **MEDICAID or UNINSURED** [facility] dept. Of Physical Therapy , [place] [address] [phone]  [facility]  [address] [place], [place] [address]  [phone]   [facility]  [address]  [place], [place] [address]  [phone]   [facility]  [address] [place], [place] [address]  [phone]   [facility]  [address]  [place], [place] [address]  [phone]   [facility]  [address] [place], [place] [address]  [phone]  [facility]  [address]  [place], [place] [address]  [phone]  AQUATIC  Kathalene Frames  [facility]  [employer]  [facility]  [place]  [place]  *Residents only*  [facility] [employer]  *[facility]  Exercise class  [facility]  Exercise class  [facility]  [address]  [place], [place]   [phone]  [facility]  [address]  [place], [place] [address]  [phone]   [place], [place]  [facility]  [address]  [phone]   [facility]  [facility]  [address]  [phone]           or  [address]  [phone]   [facility]   Provides education and support and practical information for coping with arthritis for arthritis sufferers and their families.   When: 12:15 - 1:30 p.m. the second Monday of each month, March through December  Info: Call Rehabilitation Services at [phone]

## 2023-08-10 ENCOUNTER — Other Ambulatory Visit: Payer: Self-pay | Admitting: Nurse Practitioner

## 2023-08-10 DIAGNOSIS — E119 Type 2 diabetes mellitus without complications: Secondary | ICD-10-CM

## 2023-08-10 MED ORDER — METFORMIN HCL ER 500 MG PO TB24: 500.0000 mg | ORAL_TABLET | Freq: Every day | ORAL | 3 refills | Status: DC

## 2023-08-10 NOTE — Patient Instructions (Signed)
 Diabetes mellitus y nutricin, en adultos Diabetes Mellitus and Nutrition, Adult Si sufre de diabetes, o diabetes mellitus, es muy importante tener hbitos alimenticios saludables debido a que sus niveles de Psychologist, counselling sangre (glucosa) se ven afectados en gran medida por lo que come y bebe. [place] alimentos saludables en las cantidades correctas, aproximadamente a la misma hora todos los [place], [place] ayudar a: Chief Operating Officer su glucemia. Disminuir el riesgo de sufrir una enfermedad cardaca. Mejorar la presin arterial. Barista o mantener un peso saludable. Qu puede afectar mi plan de alimentacin? Todas las personas que sufren de diabetes son diferentes y cada una tiene necesidades diferentes en cuanto a un plan de alimentacin. El mdico puede recomendarle que trabaje con un nutricionista para elaborar el mejor plan para usted. Su plan de alimentacin puede variar segn factores como: Las caloras que necesita. Los medicamentos que toma. Su peso. Sus niveles de glucemia, presin arterial y colesterol. Su nivel de [place]. Otras afecciones que tenga, como enfermedades cardacas o renales. Cmo me afectan los carbohidratos? Los carbohidratos, o hidratos de carbono, afectan su nivel de glucemia ms que cualquier otro tipo de alimento. La ingesta de carbohidratos aumenta la cantidad de [employer]. Es importante conocer la cantidad de carbohidratos que se pueden ingerir en cada comida sin correr Surveyor, minerals. Esto es Government social research officer. Su nutricionista puede ayudarlo a calcular la cantidad de carbohidratos que debe ingerir en cada comida y en cada refrigerio. Cmo me afecta el alcohol? El alcohol puede provocar una disminucin de la glucemia (hipoglucemia), especialmente si [place] insulina o toma determinados medicamentos por va oral para la diabetes. La hipoglucemia es una afeccin potencialmente mortal. Los sntomas de la hipoglucemia, como somnolencia, mareos y confusin, son  similares a los sntomas de haber consumido demasiado alcohol. No beba alcohol si: Su mdico le indica no hacerlo. Est embarazada, puede estar embarazada o est tratando de [place]. Si bebe alcohol: Limite la cantidad que bebe a lo siguiente: De 0 a 1 medida por da para las mujeres. De 0 a 2 medidas por da para los hombres. Sepa cunta cantidad de alcohol hay en las bebidas que toma. En los [address], una medida equivale a una botella de cerveza de 12 oz (355 ml), un vaso de vino de 5 oz (148 ml) o un vaso de una bebida alcohlica de alta graduacin de 1 oz (44 ml). Mantngase hidratado bebiendo agua, refrescos dietticos o t helado sin azcar. Tenga en cuenta que los refrescos comunes, los jugos y otras bebidas para mezclar pueden contener Product/process development scientist y se deben contar como carbohidratos. Consejos para seguir Social worker las etiquetas de los alimentos Comience por leer el tamao de la porcin en la etiqueta de Informacin nutricional de los alimentos envasados y las bebidas. La cantidad de caloras, carbohidratos, grasas y otros nutrientes detallados en la etiqueta se basan en una porcin del alimento. Muchos alimentos contienen ms de una porcin por envase. Verifique la cantidad total de gramos (g) de carbohidratos totales en una porcin. Verifique la cantidad de gramos de grasas saturadas y grasas trans en una porcin. Escoja alimentos que no contengan estas grasas o que su contenido de estas sea [place]. Verifique la cantidad de miligramos (mg) de sal (sodio) en una porcin. La [employer] de las personas deben limitar la ingesta de sodio total a menos de 2300 mg Google. Siempre consulte la informacin nutricional de los alimentos etiquetados como "con bajo contenido de grasa" o "sin grasa".  Estos alimentos pueden tener un mayor contenido de International aid/development worker agregada o carbohidratos refinados, y deben evitarse. Hable con su nutricionista para identificar sus objetivos diarios en  cuanto a los nutrientes mencionados en la etiqueta. Al ir de compras Evite comprar alimentos procesados, enlatados o precocidos. Estos alimentos tienden a Counselling psychologist mayor cantidad de [place], sodio y azcar agregada. Compre en la zona exterior de la tienda de comestibles. Esta es la zona donde se encuentran con mayor frecuencia las frutas y las verduras frescas, los cereales a granel, las carnes frescas y los productos lcteos frescos. Al cocinar Use mtodos de coccin a baja temperatura, como hornear, en lugar de mtodos de coccin a alta temperatura, como frer en abundante aceite. Cocine con aceites saludables, como el aceite de [place], canola o [place]. Evite cocinar con manteca, crema o carnes con alto contenido de grasa. Planificacin de las comidas Coma las comidas y los refrigerios regularmente, preferentemente a la misma hora todos [place]. Evite pasar largos perodos de tiempo sin comer. Consuma alimentos ricos en fibra, como frutas frescas, verduras, frijoles y cereales integrales. Consuma entre 4 y 6 onzas (entre 112 y 168 g) de protenas magras por da, como carnes [place], pollo, pescado, huevos o tofu. Una onza (oz) (28 g) de protena magra equivale a: 1 onza (28 g) de carne, pollo o pescado. 1 huevo.  taza (62 g) de tofu. Coma algunos alimentos por da que contengan grasas saludables, como aguacates, frutos secos, semillas y pescado. Qu alimentos debo comer? Nils Pyle Bayas. Manzanas. Naranjas. Duraznos. Damascos. Ciruelas. Uvas. Mangos. Papayas. Granadas. Kiwi. Cerezas. Verduras Verduras de [employer], que incluyen [place], [place], col rizada, acelga, hojas de berza, hojas de mostaza y repollo. Remolachas. Coliflor. Brcoli. Zanahorias. Judas verdes. Tomates. Pimientos. Cebollas. Pepinos. Coles de Bruselas. Granos Granos integrales, como panes, galletas, tortillas, cereales y pastas de salvado o integrales. Avena sin azcar. Quinua. Arroz integral o salvaje. Carnes y otras  protenas Frutos de mar. Carne de ave sin piel. Cortes magros de ave y carne de res. Tofu. Frutos secos. Semillas. Lcteos Productos lcteos sin grasa o con bajo contenido de [place], [place], yogur y [place]. Es posible que los productos detallados arriba no constituyan una lista completa de los alimentos y las bebidas que puede tomar. Consulte a un nutricionista para obtener ms informacin. Qu alimentos debo evitar? Nils Pyle Frutas enlatadas al almbar. Verduras Verduras enlatadas. Verduras congeladas con mantequilla o salsa de crema. Granos Productos elaborados con [place] y [place], como panes, pastas, bocadillos y cereales. Evite todos los alimentos procesados. Carnes y [address] de carne con alto contenido de Holiday representative. Carne de ave con piel. Carnes empanizadas o fritas. Carne procesada. Evite las grasas saturadas. Lcteos Yogur, queso o [employer] Health. Bebidas Bebidas azucaradas, como gaseosas o t helado. Es posible que los productos que se enumeran ms [place] no constituyan una lista completa de los alimentos y las bebidas que Personnel officer. Consulte a un nutricionista para obtener ms informacin. Preguntas para hacerle al mdico Debo consultar con un especialista certificado en atencin y educacin sobre la diabetes? Es necesario que me rena con un nutricionista? A qu nmero puedo llamar si tengo preguntas? Cules son los mejores momentos para controlar la glucemia? Dnde encontrar ms informacin: American Diabetes Association (Asociacin Estadounidense de la Diabetes): diabetes.org Academy of Nutrition and Dietetics (Academia de Nutricin y Pension scheme manager): eatright.Dana Corporation of Diabetes and Digestive and Kidney Diseases [employer] de la Diabetes y las Enfermedades Digestivas y Renales): StageSync.si Association of Diabetes  Care & Education Specialists (Asociacin de Especialistas en Atencin y Francella Solian la Diabetes):  diabeteseducator.org Resumen Es importante tener hbitos alimenticios saludables debido a que sus niveles de Psychologist, counselling sangre (glucosa) se ven afectados en gran medida por lo que come y bebe. Es importante consumir alcohol con prudencia. Un plan de comidas saludable lo ayudar a controlar la glucosa en sangre y a reducir el riesgo de enfermedades cardacas. El mdico puede recomendarle que trabaje con un nutricionista para elaborar el mejor plan para usted. Esta informacin no tiene Theme park manager el consejo del mdico. Asegrese de hacerle al mdico cualquier pregunta que tenga. Document Revised: 01/22/2020 Document Reviewed: 01/22/2020 Elsevier Patient Education  2024 [employer].

## 2023-08-15 ENCOUNTER — Ambulatory Visit
Admission: RE | Admit: 2023-08-15 | Discharge: 2023-08-15 | Disposition: A | Discharge status: Home / Self Care | Attending: Neurosurgery | Admitting: Neurosurgery

## 2023-08-15 ENCOUNTER — Ambulatory Visit
Admission: RE | Admit: 2023-08-15 | Discharge: 2023-08-15 | Disposition: A | Discharge status: Home / Self Care | Source: Ambulatory Visit | Attending: Neurosurgery | Admitting: Neurosurgery

## 2023-08-15 DIAGNOSIS — Z9889 Other specified postprocedural states: Secondary | ICD-10-CM | POA: Diagnosis present

## 2023-08-15 DIAGNOSIS — R042 Hemoptysis: Secondary | ICD-10-CM | POA: Diagnosis present

## 2023-08-16 ENCOUNTER — Ambulatory Visit
Admission: RE | Admit: 2023-08-16 | Discharge: 2023-08-16 | Disposition: A | Discharge status: Home / Self Care | Source: Ambulatory Visit | Attending: Nurse Practitioner

## 2023-08-16 DIAGNOSIS — R042 Hemoptysis: Secondary | ICD-10-CM

## 2023-08-16 MED ORDER — IOHEXOL 300 MG/ML  SOLN
75.0000 mL | Freq: Once | INTRAMUSCULAR | Status: AC | PRN
  Administered 2023-08-16: 75 mL via INTRAVENOUS

## 2023-08-23 ENCOUNTER — Other Ambulatory Visit: Payer: Self-pay | Admitting: Nurse Practitioner

## 2023-08-23 DIAGNOSIS — J849 Interstitial pulmonary disease, unspecified: Secondary | ICD-10-CM

## 2023-09-01 ENCOUNTER — Telehealth: Admitting: Neurosurgery

## 2023-09-04 ENCOUNTER — Telehealth: Admitting: Neurosurgery

## 2023-09-05 ENCOUNTER — Ambulatory Visit (INDEPENDENT_AMBULATORY_CARE_PROVIDER_SITE_OTHER): Admitting: Neurosurgery

## 2023-09-05 DIAGNOSIS — M47816 Spondylosis without myelopathy or radiculopathy, lumbar region: Secondary | ICD-10-CM

## 2023-09-05 NOTE — Progress Notes (Signed)
 I had a follow-up phone visit today with James Yates.  That this was done with interpretive assistance on the phone service.  We discussed his x-rays which demonstrated some compression fractures.  These appear to be new from some remote imaging and could be the cause of his newer back pain since surgery.  He does state that he has had some improvement and that his pain seems to fluctuate.  I let him know that these are generally improved without treatment but without we will continue to follow him and see him at his next appointment to discuss whether or not he would need a referral to interventional radiology for evaluation for possible kyphoplasty.  He acknowledged understanding and said he would let us know.  We discussed red flag symptoms.  Spent a total of 10 minutes on the phone visit today.

## 2023-09-15 ENCOUNTER — Other Ambulatory Visit: Payer: Self-pay | Admitting: Orthopedic Surgery

## 2023-09-15 DIAGNOSIS — S22080D Wedge compression fracture of T11-T12 vertebra, subsequent encounter for fracture with routine healing: Secondary | ICD-10-CM

## 2023-09-15 NOTE — Progress Notes (Signed)
   REFERRING PHYSICIAN:  Kent Pear, Md No address on file  Interpreter IPAD used as patient speaks spanish.  DOS: 06/29/23 Right L4-L5 microdiscectomy  HISTORY OF PRESENT ILLNESS:  Preop right leg pain was gone at his last visit but he continued with LBP. Xrays from 08/15/23 showed compression deformities of T11 and T12.   He is here for follow up and repeat xrays.   He has constant LBP that is worse in the morning. He feels stiff all day. He also has 3 week history of constant right lateral leg pain to his foot. This is worse with standing and walking. He has numbness and tingling in the leg. No weakness. This feels like his preop right leg pain. LBP is slightly worse than right leg pain.   He needs a refill of neurontin  and flexeril .   PHYSICAL EXAMINATION:  General: Patient is well developed, well nourished, calm, collected, and in no apparent distress.   NEUROLOGICAL:  General: In no acute distress.   Awake, alert, oriented to person, place, and time.  Pupils equal round and reactive to light.  Facial tone is symmetric.     Strength:          Side Iliopsoas Quads Hamstring PF DF EHL  R 5 5 5 5 5 5   L 5 5 5 5 5 5    Incision well healed  Has pain with strength testing right quad- no gross weakness.   No tenderness at TL junction. He has diffuse lower lumbar tenderness.   No pain with IR/ER of both hips.   Sensation intact to light touch in bilateral lower extremities.    ROS (Neurologic):  Negative except as noted above  IMAGING: Lumbar xrays dated 09/26/23:  Stable compression T11 and T12 from previous xrays.   Report not yet available for above xrays.   ASSESSMENT/PLAN:  James Yates has constant LBP that is worse in the morning. He feels stiff all day. He also has 3 week history of constant right lateral leg pain to his foot.He has numbness and tingling in the leg. No weakness. This feels like his preop right leg pain. LBP is slightly worse than  right leg pain.   Above xrays show stable compression of T11 and T12.   No tenderness at TL junction and most of his pain is in lower lumbar spine.   Treatment options reviewed with patient and following plan made:   - Medrol  dose pack to help with symptom relief. Reviewed dosing and side effects. He is on metformin  for prediabetes. Does not check blood sugar regularly.  - Refill of neurontin  sent to pharmacy. Reviewed side effects.  - Refill of flexeril  sent to pharmacy. Reviewed side effects. He knows this can make him sleepy.  - He will avoid bending, twisting, and lifting for now.  - Will call him in 2 weeks to check on him. If no improvement, consider MRI of thoracic and lumbar spine.   Advised to contact the office if any questions or concerns arise.  Lucetta Russel PA-C Department of neurosurgery

## 2023-09-18 ENCOUNTER — Encounter: Payer: Medicare Other | Admitting: Orthopedic Surgery

## 2023-09-26 ENCOUNTER — Encounter: Payer: Self-pay | Admitting: Orthopedic Surgery

## 2023-09-26 ENCOUNTER — Ambulatory Visit (INDEPENDENT_AMBULATORY_CARE_PROVIDER_SITE_OTHER): Payer: Medicare Other | Admitting: Orthopedic Surgery

## 2023-09-26 ENCOUNTER — Ambulatory Visit
Admission: RE | Admit: 2023-09-26 | Discharge: 2023-09-26 | Disposition: A | Discharge status: Home / Self Care | Source: Ambulatory Visit | Attending: Orthopedic Surgery | Admitting: Orthopedic Surgery

## 2023-09-26 ENCOUNTER — Ambulatory Visit
Admission: RE | Admit: 2023-09-26 | Discharge: 2023-09-26 | Disposition: A | Discharge status: Home / Self Care | Attending: Orthopedic Surgery | Admitting: Orthopedic Surgery

## 2023-09-26 VITALS — BP 130/84 | Temp 98.2°F | Ht 65.0 in | Wt 198.0 lb

## 2023-09-26 DIAGNOSIS — S22080D Wedge compression fracture of T11-T12 vertebra, subsequent encounter for fracture with routine healing: Secondary | ICD-10-CM

## 2023-09-26 DIAGNOSIS — M5416 Radiculopathy, lumbar region: Secondary | ICD-10-CM

## 2023-09-26 DIAGNOSIS — M47816 Spondylosis without myelopathy or radiculopathy, lumbar region: Secondary | ICD-10-CM

## 2023-09-26 DIAGNOSIS — Z9889 Other specified postprocedural states: Secondary | ICD-10-CM

## 2023-09-26 DIAGNOSIS — M4726 Other spondylosis with radiculopathy, lumbar region: Secondary | ICD-10-CM

## 2023-09-26 MED ORDER — METHYLPREDNISOLONE 4 MG PO TBPK: ORAL_TABLET | ORAL | 0 refills | Status: DC

## 2023-09-26 MED ORDER — CYCLOBENZAPRINE HCL 5 MG PO TABS: 5.0000 mg | ORAL_TABLET | Freq: Three times a day (TID) | ORAL | 0 refills | Status: DC | PRN

## 2023-09-26 MED ORDER — GABAPENTIN 300 MG PO CAPS: 300.0000 mg | ORAL_CAPSULE | Freq: Every day | ORAL | 1 refills | Status: DC

## 2023-09-26 NOTE — Patient Instructions (Signed)
 It was nice to see you today.   No bending, twisting, or lifting.   I sent a refill of  gabapentin  to your pharmacy. Take as directed.   I sent a refill of flexeril  to your pharmacy. Remember this can make you sleepy.   I sent a steroid pack to your pharmacy. Take as directed.   We will call you to check on you in 2 weeks.  Please call with any questions or concerns.   Lucetta Russel PA-C [phone]     The physicians and staff at [facility] at [facility] are committed to providing excellent care. You may receive a survey asking for feedback about your experience at our office. We value you your feedback and appreciate you taking the time to to fill it out. The [facility] leadership team is also available to discuss your experience in person, feel free to contact us  [phone].

## 2023-10-10 ENCOUNTER — Telehealth: Payer: Self-pay | Admitting: Orthopedic Surgery

## 2023-10-10 DIAGNOSIS — S22080D Wedge compression fracture of T11-T12 vertebra, subsequent encounter for fracture with routine healing: Secondary | ICD-10-CM

## 2023-10-10 DIAGNOSIS — M5416 Radiculopathy, lumbar region: Secondary | ICD-10-CM

## 2023-10-10 DIAGNOSIS — Z9889 Other specified postprocedural states: Secondary | ICD-10-CM

## 2023-10-10 NOTE — Telephone Encounter (Signed)
-----   Message from Lucetta Russel M sent at 09/26/2023 10:13 AM EDT ----- Have James Yates call him. If no better, get MRI of T/L spine????

## 2023-10-10 NOTE — Addendum Note (Signed)
 Addended byLucetta Russel on: 10/10/2023 03:48 PM   Modules accepted: Orders

## 2023-10-10 NOTE — Telephone Encounter (Signed)
 Patient aware.

## 2023-10-10 NOTE — Telephone Encounter (Signed)
 Patient states that he did get some relief from the dose pack but not much. The pain is more intense in the morning. He now has to ambulate with a cane. He would like for you to order a MRI.

## 2023-10-10 NOTE — Telephone Encounter (Signed)
 MRI of thoracic and lumbar spine ordered. Will call him for follow up once I have the results back. Please let him know.   Thanks!

## 2023-10-10 NOTE — Telephone Encounter (Signed)
 Can you please call him and  see how he is doing? Any relief with dose pack?   If he is still hurting a lot, then I want to get an MRI of his thoracic and lumbar spine.   Please let me know.   Thanks!

## 2023-10-12 ENCOUNTER — Encounter: Payer: Self-pay | Admitting: Pulmonary Disease

## 2023-10-12 ENCOUNTER — Ambulatory Visit: Admitting: Pulmonary Disease

## 2023-10-12 VITALS — BP 126/78 | HR 84 | Ht 65.0 in | Wt 197.0 lb

## 2023-10-12 DIAGNOSIS — J849 Interstitial pulmonary disease, unspecified: Secondary | ICD-10-CM | POA: Diagnosis not present

## 2023-10-12 DIAGNOSIS — I200 Unstable angina: Secondary | ICD-10-CM

## 2023-10-12 MED ORDER — BUDESONIDE-FORMOTEROL FUMARATE 160-4.5 MCG/ACT IN AERO: 2.0000 | INHALATION_SPRAY | Freq: Two times a day (BID) | RESPIRATORY_TRACT | 12 refills | Status: DC

## 2023-10-12 NOTE — Progress Notes (Signed)
 Synopsis: Referred in by Bluford Burkitt, NP   Subjective:   PATIENT ID: James Yates GENDER: male DOB: 1955-10-06, MRN: [id number]  Chief Complaint  Patient presents with   Pulmonary Consult    Referred by Bluford Burkitt, NP for eval of abn CT Chest done 08/16/23. He c/o SOB over the past 4-5 years.  He feels SOB "all the time" with or without exertion. He has occ cough- prod with yellow sputum.     HPI James Yates is a 68 year old male patient with a past medical history of hypertension, CAD status post CABG in February 2015, bicuspid aortic valve status post TAVR in October 2019 presenting for evaluation of interstitial lung disease.  He reports shortness of breath for about 5 years that has been progressively worsening.  Describes it as sometimes he has to take 2-3 deep breaths on that he feels that his lungs are filling with air.  It is associated with a cough with yellowish sputum production.  Also describes dyspnea on exertion that is worsening.  Also described his specific chest pain  He does report joint stiffness mostly in his wrists.  He does have lumbar radiculopathy status post surgery recently.  He denies any skin rash denies any difficulty swallowing denies any Raynaud's.   08/16/2023 CT chest w contrast -diffuse groundglass appearance in both lungs without any specific distribution pattern. No sign of mediastinal LAN. This is stable compared to 2024.   04/18/2022 Echocardiogram Normal EF 55 to 60%. LVEF normal function. RV syst fct is low normal but otherwise no signs of PH.   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 Has 1 dog at home. He is originally from [place] and denies any exposure to birds. Last visited [place] a year ago.    ROS All systems were reviewed and are negative except for the above.  Objective:   Vitals:   10/12/23 1018  BP: 126/78  Pulse: 84  SpO2: 96%  Weight: 197 lb (89.4  kg)  Height: 5\' 5"  (1.651 m)   96% on RA BMI Readings from Last 3 Encounters:  10/12/23 32.78 kg/m  09/26/23 32.95 kg/m  08/09/23 32.95 kg/m   Wt Readings from Last 3 Encounters:  10/12/23 197 lb (89.4 kg)  09/26/23 198 lb (89.8 kg)  08/09/23 198 lb (89.8 kg)    Physical Exam GEN: NAD, Healthy Appearing HEENT: Supple Neck, Reactive Pupils, EOMI  CVS: Normal S1, Normal S2, RRR, Systolic murmur heard best at the right sternal border Lungs: Clear bilateral air entry.  Abdomen: Soft, non tender, non distended, + BS  Extremities: Warm and well perfused, No edema  Skin: No suspicious lesions appreciated  Psych: Normal Affect  Ancillary Information   CBC    Component Value Date/Time   WBC 8.6 08/08/2023 1437   RBC 5.42 08/08/2023 1437   HGB 16.1 08/08/2023 1437   HGB 15.6 02/14/2023 1206   HCT 47.7 08/08/2023 1437   HCT 48.7 02/14/2023 1206   PLT 198.0 08/08/2023 1437   PLT 177 02/14/2023 1206   MCV 88.0 08/08/2023 1437   MCV 89 02/14/2023 1206   MCV 87 07/01/2013 1641   MCH 28.5 02/14/2023 1206   MCH 28.4 01/24/2023 0010   MCHC 33.7 08/08/2023 1437   RDW 13.2 08/08/2023 1437   RDW 13.4 02/14/2023 1206   RDW 13.3 07/01/2013 1641   LYMPHSABS 1.6 08/08/2023 1437   LYMPHSABS  0.9 03/07/2019 1033   MONOABS 0.7 08/08/2023 1437   EOSABS 0.2 08/08/2023 1437   EOSABS 0.1 03/07/2019 1033   BASOSABS 0.0 08/08/2023 1437   BASOSABS 0.0 03/07/2019 1033   Labs and imaging were reviewed.     Latest Ref Rng & Units 02/27/2018    1:07 PM 07/03/2013    2:50 PM  PFT Results  FVC-Pre L 3.25  2.92   FVC-Predicted Pre % 80  69   FVC-Post L 3.05  3.12   FVC-Predicted Post % 75  74   Pre FEV1/FVC % % 78  76   Post FEV1/FCV % % 85  77   FEV1-Pre L 2.53  2.23   FEV1-Predicted Pre % 82  69   FEV1-Post L 2.58  2.41   DLCO uncorrected ml/min/mmHg 26.10  35.37   DLCO UNC% % 96  131   DLCO corrected ml/min/mmHg  33.54   DLCO COR %Predicted %  124   DLVA Predicted % 128  167   TLC  L  4.73   TLC % Predicted %  76   RV % Predicted %  73      Assessment & Plan:  James Yates is a 68 year old male patient with a past medical history of hypertension, CAD status post CABG in February 2015, bicuspid aortic valve status post TAVR in October 2019 presenting for evaluation of interstitial lung disease.  #DPLD Non specific pattern, Diffuse GGO with no specific spatial distribution. He does report joint stiffness but no other systemic symptoms to suggests CTD-ILD.  No exposure to environement ag know to cause HP but this is on the differential.  Alternatively air trapping can cause this appearance and I am suspecting some degree of asthma with this patient.   []  High Res CT chest on inspiration and expiration.  []  PFTs  []  ANA 12 profile and Myomarker 3 panel.  []  BNP  []  Trial ICS-LABA and assess response in 3 months.   Return in about 3 months (around 01/12/2024).  I spent 60 minutes caring for this patient today, including preparing to see the patient, obtaining a medical history , reviewing a separately obtained history, performing a medically appropriate examination and/or evaluation, counseling and educating the patient/family/caregiver, ordering medications, tests, or procedures, documenting clinical information in the electronic health record, and independently interpreting results (not separately reported/billed) and communicating results to the patient/family/caregiver  Annitta Kindler, MD [facility] 10/12/2023 11:31 AM

## 2023-10-13 ENCOUNTER — Other Ambulatory Visit
Admission: RE | Admit: 2023-10-13 | Discharge: 2023-10-13 | Disposition: A | Discharge status: Home / Self Care | Attending: Pulmonary Disease | Admitting: Pulmonary Disease

## 2023-10-13 DIAGNOSIS — J849 Interstitial pulmonary disease, unspecified: Secondary | ICD-10-CM | POA: Insufficient documentation

## 2023-10-13 DIAGNOSIS — I200 Unstable angina: Secondary | ICD-10-CM | POA: Diagnosis not present

## 2023-10-13 LAB — BRAIN NATRIURETIC PEPTIDE: B Natriuretic Peptide: 34.8 pg/mL (ref 0.0–100.0)

## 2023-10-15 ENCOUNTER — Ambulatory Visit
Admission: RE | Admit: 2023-10-15 | Discharge: 2023-10-15 | Disposition: A | Discharge status: Home / Self Care | Source: Ambulatory Visit | Attending: Orthopedic Surgery | Admitting: Orthopedic Surgery

## 2023-10-15 DIAGNOSIS — S22080D Wedge compression fracture of T11-T12 vertebra, subsequent encounter for fracture with routine healing: Secondary | ICD-10-CM | POA: Insufficient documentation

## 2023-10-15 DIAGNOSIS — Z9889 Other specified postprocedural states: Secondary | ICD-10-CM | POA: Insufficient documentation

## 2023-10-15 DIAGNOSIS — M5416 Radiculopathy, lumbar region: Secondary | ICD-10-CM

## 2023-10-24 NOTE — Telephone Encounter (Signed)
 MRIs on 10/15/2023. Results are still pending.

## 2023-10-27 NOTE — Telephone Encounter (Signed)
 Report still pending.

## 2023-10-28 LAB — MISC LABCORP TEST (SEND OUT): Labcorp test code: 520085

## 2023-11-06 ENCOUNTER — Telehealth: Payer: Self-pay

## 2023-11-06 NOTE — Telephone Encounter (Signed)
 Pt has an appt on 11-08-23 needs an interpreter requested

## 2023-11-08 ENCOUNTER — Telehealth: Payer: Self-pay

## 2023-11-08 ENCOUNTER — Ambulatory Visit (INDEPENDENT_AMBULATORY_CARE_PROVIDER_SITE_OTHER): Admitting: Nurse Practitioner

## 2023-11-08 ENCOUNTER — Other Ambulatory Visit: Payer: Self-pay

## 2023-11-08 VITALS — BP 120/78 | HR 108 | Temp 98.9°F | Ht 65.0 in | Wt 199.4 lb

## 2023-11-08 DIAGNOSIS — J849 Interstitial pulmonary disease, unspecified: Secondary | ICD-10-CM | POA: Diagnosis not present

## 2023-11-08 DIAGNOSIS — I10 Essential (primary) hypertension: Secondary | ICD-10-CM | POA: Diagnosis not present

## 2023-11-08 DIAGNOSIS — M5416 Radiculopathy, lumbar region: Secondary | ICD-10-CM

## 2023-11-08 DIAGNOSIS — Z7984 Long term (current) use of oral hypoglycemic drugs: Secondary | ICD-10-CM

## 2023-11-08 DIAGNOSIS — E119 Type 2 diabetes mellitus without complications: Secondary | ICD-10-CM

## 2023-11-08 DIAGNOSIS — E7800 Pure hypercholesterolemia, unspecified: Secondary | ICD-10-CM

## 2023-11-08 DIAGNOSIS — F419 Anxiety disorder, unspecified: Secondary | ICD-10-CM

## 2023-11-08 DIAGNOSIS — F32A Depression, unspecified: Secondary | ICD-10-CM

## 2023-11-08 DIAGNOSIS — M47816 Spondylosis without myelopathy or radiculopathy, lumbar region: Secondary | ICD-10-CM

## 2023-11-08 DIAGNOSIS — Z9889 Other specified postprocedural states: Secondary | ICD-10-CM

## 2023-11-08 DIAGNOSIS — S22080D Wedge compression fracture of T11-T12 vertebra, subsequent encounter for fracture with routine healing: Secondary | ICD-10-CM

## 2023-11-08 DIAGNOSIS — R051 Acute cough: Secondary | ICD-10-CM | POA: Diagnosis not present

## 2023-11-08 DIAGNOSIS — I25719 Atherosclerosis of autologous vein coronary artery bypass graft(s) with unspecified angina pectoris: Secondary | ICD-10-CM | POA: Diagnosis not present

## 2023-11-08 LAB — POCT GLYCOSYLATED HEMOGLOBIN (HGB A1C)
HbA1c POC (<> result, manual entry): 6.3 % (ref 4.0–5.6)
HbA1c, POC (controlled diabetic range): 6.3 % (ref 0.0–7.0)
HbA1c, POC (prediabetic range): 6.3 % (ref 5.7–6.4)
Hemoglobin A1C: 6.3 % — AB (ref 4.0–5.6)

## 2023-11-08 LAB — MISC LABCORP TEST (SEND OUT): Labcorp test code: 520180

## 2023-11-08 MED ORDER — ROSUVASTATIN CALCIUM 40 MG PO TABS: 40.0000 mg | ORAL_TABLET | Freq: Every day | ORAL | 3 refills | Status: AC

## 2023-11-08 MED ORDER — PREDNISONE 20 MG PO TABS: 40.0000 mg | ORAL_TABLET | Freq: Every day | ORAL | 0 refills | Status: DC

## 2023-11-08 MED ORDER — GABAPENTIN 300 MG PO CAPS: 300.0000 mg | ORAL_CAPSULE | Freq: Every day | ORAL | 1 refills | Status: DC

## 2023-11-08 MED ORDER — AMLODIPINE BESYLATE 10 MG PO TABS: 10.0000 mg | ORAL_TABLET | Freq: Every day | ORAL | 3 refills | Status: AC

## 2023-11-08 MED ORDER — METOPROLOL TARTRATE 100 MG PO TABS: 100.0000 mg | ORAL_TABLET | Freq: Two times a day (BID) | ORAL | 3 refills | Status: DC

## 2023-11-08 MED ORDER — LOSARTAN POTASSIUM 100 MG PO TABS: 100.0000 mg | ORAL_TABLET | Freq: Every day | ORAL | 3 refills | Status: AC

## 2023-11-08 MED ORDER — CYCLOBENZAPRINE HCL 5 MG PO TABS
5.0000 mg | ORAL_TABLET | Freq: Three times a day (TID) | ORAL | 0 refills | Status: DC | PRN
Start: 2023-11-08 — End: 2024-04-18

## 2023-11-08 NOTE — Addendum Note (Signed)
 Addended byLucetta Russel on: 11/08/2023 01:10 PM   Modules accepted: Orders

## 2023-11-08 NOTE — Telephone Encounter (Signed)
 Error

## 2023-11-08 NOTE — Progress Notes (Signed)
 James Glance, NP-C Phone: [phone]  James Yates is a 68 y.o. male who presents today for follow up.   Discussed the use of AI scribe software for clinical note transcription with the patient, who gave verbal consent to proceed.  History of Present Illness   James Yates is a 68 year old male with hypertension and coronary artery disease who presents for a three-month follow-up.  He experiences increased anxiety and stomach pain when coughing. He has not been taking his sertraline , which was increased from 50 mg to 100 mg during his last visit, as he ran out of the medication last week.  He continues to take his blood pressure medications and monitors his blood pressure at home, usually recording readings of 140-145 mmHg, though it once reached 170 mmHg. He experiences headaches and feels sick when his blood pressure is high. He has intermittent chest pain and shortness of breath, particularly on exertion, such as when walking faster. He also experiences these symptoms at rest, sometimes when moving from one area to another. He has a history of heart surgeries and valve replacements and is scheduled to see cardiology next month.  He was started on metformin  three months ago for diabetes. He does not check his blood sugar at home and denies excessive thirst or urination, noting he drinks a little water  and urinates twice a day.  He experiences back pain that sometimes radiates to his leg, which is relieved by muscle relaxers. He also reports abdominal muscle pain when coughing, which he attributes to muscle tension. He occasionally coughs but does not do so frequently.  He was prescribed an inhaler by pulmonology, but he could not purchase it due to the high cost. He mentions that his insurance does not cover much of his medication costs, and his retirement income is insufficient to cover these expenses.      Social History   Tobacco Use  Smoking Status Never   Passive exposure:  Never  Smokeless Tobacco Never    Current Outpatient Medications on File Prior to Visit  Medication Sig Dispense Refill   aspirin  EC 81 MG tablet Take 1 tablet (81 mg total) by mouth daily. Resume 07/01/22     budesonide -formoterol  (SYMBICORT ) 160-4.5 MCG/ACT inhaler Inhale 2 puffs into the lungs 2 (two) times daily. 1 each 12   metFORMIN  (GLUCOPHAGE -XR) 500 MG 24 hr tablet Take 1 tablet (500 mg total) by mouth daily with breakfast. 90 tablet 3   nitroGLYCERIN  (NITROSTAT ) 0.4 MG SL tablet Place 1 tablet (0.4 mg total) under the tongue every 5 (five) minutes as needed for chest pain. 30 tablet 1   pantoprazole  (PROTONIX ) 40 MG tablet Take 1 tablet (40 mg total) by mouth daily. 90 tablet 3   sertraline  (ZOLOFT ) 100 MG tablet Take 1 tablet (100 mg total) by mouth daily. 90 tablet 3   No current facility-administered medications on file prior to visit.     ROS see history of present illness  Objective  Physical Exam Vitals:   11/08/23 1041  BP: 120/78  Pulse: (!) 108  Temp: 98.9 F (37.2 C)  SpO2: 96%    BP Readings from Last 3 Encounters:  11/08/23 120/78  10/12/23 126/78  09/26/23 130/84   Wt Readings from Last 3 Encounters:  11/08/23 199 lb 6.4 oz (90.4 kg)  10/12/23 197 lb (89.4 kg)  09/26/23 198 lb (89.8 kg)    Physical Exam Constitutional:      General: He is not in acute distress.  Appearance: Normal appearance.  HENT:     Head: Normocephalic.   Cardiovascular:     Rate and Rhythm: Normal rate and regular rhythm.     Heart sounds: Normal heart sounds.  Pulmonary:     Effort: Pulmonary effort is normal.     Breath sounds: Normal breath sounds.   Skin:    General: Skin is warm and dry.   Neurological:     General: No focal deficit present.     Mental Status: He is alert.   Psychiatric:        Mood and Affect: Mood normal.        Behavior: Behavior normal.      Assessment/Plan: Please see individual problem list.  Type 2 diabetes mellitus  without complication, without long-term current use of insulin  (HCC) Assessment & Plan: A1c has improved to 6.3 with metformin , with no side effects. Continue metformin  once daily. Encourage healthy diet and exercise.   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 Ensure cardiology follow-up in July. Continue risk factor management. Strict precautions given to patient.   Orders: -     AMB Referral VBCI Care Management  ILD (interstitial lung disease) (HCC) Assessment & Plan: He is unable to afford Symbicort  and needs an affordable inhaler. Contact pulmonology for alternative, affordable inhaler options.   HTN, goal below 140/90 Assessment & Plan: His hypertension is often elevated in home readings, with concerns about the accuracy of his home cuff. Blood pressure at goal in office. He is on losartan  100 mg daily, metoprolol  100 mg twice daily, and amlodipine  5 mg daily. He should take blood pressure readings daily after resting and bring his home blood pressure cuff to the next appointment for comparison with office readings. A blood pressure log is provided for recording home readings. Continue current antihypertensive medications: losartan , metoprolol , and amlodipine . Return precautions given to patient.  Orders: -     Metoprolol  Tartrate; Take 1 tablet (100 mg total) by mouth 2 (two) times daily.  Dispense: 180 tablet; Refill: 3 -     Losartan  Potassium; Take 1 tablet (100 mg total) by mouth daily.  Dispense: 90 tablet; Refill: 3 -     amLODIPine  Besylate; Take 1 tablet (10 mg total) by mouth daily.  Dispense: 90 tablet; Refill: 3 -     AMB Referral VBCI Care Management  Pure  hypercholesterolemia Assessment & Plan: Managed with Crestor  40 mg daily and Zetia  10 mg daily. Continue.   Orders: -     Rosuvastatin  Calcium ; Take 1 tablet (40 mg total) by mouth daily.  Dispense: 90 tablet; Refill: 3 -     AMB Referral VBCI Care Management  Anxiety and depression Assessment & Plan: Managed with Zoloft  150 mg daily. Continue.   Acute cough -     predniSONE ; Take 2 tablets (40 mg total) by mouth daily.  Dispense: 10 tablet; Refill: 0      Return in about 3 months (around 02/08/2024) for Follow up.   James Glance, NP-C [facility]

## 2023-11-08 NOTE — Telephone Encounter (Signed)
 Flexeril  and neurontin  refills sent to pharmacy. Would be out of neurontin  in next week or so.   Please let him know.   Please call reading room to read his lumbar and thoracic MRI from 10/15/23 as well.   Thanks.

## 2023-11-08 NOTE — Telephone Encounter (Signed)
 Informed patient to pick up medication from the pharmacy. I called the reading room to get the MRI read.

## 2023-11-08 NOTE — Telephone Encounter (Signed)
 Pt came in for a follow up appointment on today and was requesting refills on Gabapentin  and flexeril . Provider Bluford Burkitt, NP noticed that Neurosurgery is in control of both the meds and wanted to inform provider that pt is requesting a refill.

## 2023-11-09 NOTE — Telephone Encounter (Signed)
Results are in his chart.

## 2023-11-10 ENCOUNTER — Telehealth: Payer: Self-pay | Admitting: Orthopedic Surgery

## 2023-11-10 DIAGNOSIS — M5416 Radiculopathy, lumbar region: Secondary | ICD-10-CM

## 2023-11-10 DIAGNOSIS — Z9889 Other specified postprocedural states: Secondary | ICD-10-CM

## 2023-11-10 DIAGNOSIS — M47816 Spondylosis without myelopathy or radiculopathy, lumbar region: Secondary | ICD-10-CM

## 2023-11-10 NOTE — Telephone Encounter (Signed)
 Please make him f/u with Felipe Horton to review MRI scans. He needs interpreter.   He also needs to get repeat lumbar xrays prior to seeing Dr. Felipe Horton. I have ordered them.

## 2023-11-10 NOTE — Telephone Encounter (Signed)
 Thoracic and Lumbar MRI dated 10/15/23:  FINDINGS: MRI THORACIC SPINE FINDINGS   Alignment: Thoracic kyphosis is maintained. No significant listhesis.   Vertebrae: No bone marrow edema or evidence of fracture. Mild chronic irregularity of the T10 and T11 superior endplates. Additional mild anterior wedging of T12 with mild irregularity of the superior endplate. No significant endplate irregularity or bone marrow edema at T12. Schmorl's node involving the T6 inferior endplate. Vertebral body heights otherwise maintained. No suspicious osseous lesion.   Cord:  Normal signal and morphology.   Paraspinal and other soft tissues: Prominent anterior endplate osteophytes particularly at T3-4 and T7-8. The paraspinal soft tissues are unremarkable.   Disc levels:   Disc desiccation at multiple levels. No large disc herniation or evidence of high-grade spinal canal stenosis. There are small disc bulges at T6-7 and T7-8 which indent the ventral thecal sac without contacting the spinal cord. Mild facet arthrosis at multiple levels. No high-grade foraminal stenosis.   MRI LUMBAR SPINE FINDINGS   Segmentation:  Standard.   Alignment: Lumbar lordosis is maintained. No significant listhesis.   Vertebrae: No bone marrow edema or evidence of fracture. Vertebral body heights are maintained. Hemangioma in the S1 vertebra. Bone marrow signal intensity otherwise unremarkable. Postsurgical changes of right hemilaminectomy at L4-5.   Conus medullaris and cauda equina: Conus extends to the L1 level. Fatty filum terminale noted. Conus and cauda equina appear normal.   Paraspinal and other soft tissues: Postsurgical changes in the right paraspinal soft tissues at L4-5. Paraspinal musculature is otherwise unremarkable. Mild bladder wall thickening.   Disc levels:   T12-L1: No significant disc bulge. No significant spinal canal stenosis or foraminal stenosis. Degenerative endplate  osteophytes projecting into the left anterolateral paraspinal soft tissues.   L1-2: No significant disc bulge. No significant spinal canal stenosis or foraminal stenosis.   L2-3: Disc desiccation. No significant spinal canal stenosis. Mild facet arthrosis. No significant foraminal stenosis.   L3-4: Disc desiccation. No significant spinal canal stenosis. Mild facet arthrosis and thickening of the ligamentum flavum. Mild foraminal stenosis on the left. Paracentral annular fissure noted on sagittal images.   L4-5: Annular fissure and broad-based central disc extrusion with approximately 3 mm cranial migration of disc contents. There is lateral recess narrowing possible impingement upon the traversing nerve roots. Mild facet arthrosis. Mild spinal canal stenosis. No significant foraminal stenosis.   L5-S1: Disc desiccation. Small disc bulge slightly eccentric to the left. Mild facet arthrosis. No significant spinal canal stenosis. No significant foraminal stenosis.   IMPRESSION: MR THORACIC SPINE IMPRESSION   No evidence of acute fracture.   Chronic irregularity of the T10, T11, and T12 superior endplates with mild height loss suggestive of remote compression deformity.   Mild degenerative changes. No high-grade spinal canal or foraminal stenosis.   MR LUMBAR SPINE IMPRESSION   Degenerative changes of the lumbar spine. Similar appearance of central disc extrusion at L4-5 with 3 mm cranial migration. Lateral recess narrowing and mild spinal canal stenosis at L4-5 is similar to prior.   Right paracentral annular fissure at L3-4 and left paracentral annular fissure at L5-S1 again noted.   No high-grade foraminal stenosis.     Electronically Signed   By: Denny Flack M.D.   On: 11/08/2023 21:14  Above reviewed with Dr. Felipe Horton. He recommends repeat lumbar flex/ext xrays and follow up with him to discuss.

## 2023-11-13 NOTE — Telephone Encounter (Signed)
 No answer

## 2023-11-14 NOTE — Telephone Encounter (Signed)
 No answer

## 2023-11-15 NOTE — Telephone Encounter (Signed)
 Left message on his daughter's cell Reyna.

## 2023-11-17 NOTE — Telephone Encounter (Signed)
 No answer

## 2023-11-20 ENCOUNTER — Encounter: Payer: Self-pay | Admitting: Nurse Practitioner

## 2023-11-20 NOTE — Assessment & Plan Note (Addendum)
 His hypertension is often elevated in home readings, with concerns about the accuracy of his home cuff. Blood pressure at goal in office. He is on losartan  100 mg daily, metoprolol  100 mg twice daily, and amlodipine  5 mg daily. He should take blood pressure readings daily after resting and bring his home blood pressure cuff to the next appointment for comparison with office readings. A blood pressure log is provided for recording home readings. Continue current antihypertensive medications: losartan , metoprolol , and amlodipine . Return precautions given to patient.

## 2023-11-20 NOTE — Assessment & Plan Note (Signed)
 A1c has improved to 6.3 with metformin , with no side effects. Continue metformin  once daily. Encourage healthy diet and exercise.

## 2023-11-20 NOTE — Assessment & Plan Note (Signed)
 Intermittent chest pain and dyspnea, primarily on exertion, necessitates cardiology follow-up due to his cardiac history. Ensure cardiology follow-up in July. Continue risk factor management. Strict precautions given to patient.

## 2023-11-20 NOTE — Assessment & Plan Note (Signed)
 Managed with Zoloft  150 mg daily. Continue.

## 2023-11-20 NOTE — Assessment & Plan Note (Signed)
 Managed with Crestor  40 mg daily and Zetia  10 mg daily. Continue.

## 2023-11-20 NOTE — Assessment & Plan Note (Signed)
 He is unable to afford Symbicort  and needs an affordable inhaler. Contact pulmonology for alternative, affordable inhaler options.

## 2023-11-20 NOTE — Telephone Encounter (Signed)
 Left message to call back

## 2023-11-24 ENCOUNTER — Ambulatory Visit
Admission: RE | Admit: 2023-11-24 | Discharge: 2023-11-24 | Disposition: A | Discharge status: Home / Self Care | Source: Ambulatory Visit | Attending: Orthopedic Surgery | Admitting: Orthopedic Surgery

## 2023-11-24 DIAGNOSIS — S22089A Unspecified fracture of T11-T12 vertebra, initial encounter for closed fracture: Secondary | ICD-10-CM | POA: Diagnosis not present

## 2023-11-24 DIAGNOSIS — M5416 Radiculopathy, lumbar region: Secondary | ICD-10-CM | POA: Diagnosis not present

## 2023-11-24 DIAGNOSIS — M47816 Spondylosis without myelopathy or radiculopathy, lumbar region: Secondary | ICD-10-CM | POA: Insufficient documentation

## 2023-11-24 DIAGNOSIS — Z9889 Other specified postprocedural states: Secondary | ICD-10-CM | POA: Insufficient documentation

## 2023-11-24 DIAGNOSIS — M5126 Other intervertebral disc displacement, lumbar region: Secondary | ICD-10-CM | POA: Diagnosis not present

## 2023-11-27 ENCOUNTER — Ambulatory Visit: Admitting: Neurosurgery

## 2023-11-27 DIAGNOSIS — M5416 Radiculopathy, lumbar region: Secondary | ICD-10-CM

## 2023-11-27 DIAGNOSIS — S22080D Wedge compression fracture of T11-T12 vertebra, subsequent encounter for fracture with routine healing: Secondary | ICD-10-CM

## 2023-11-27 DIAGNOSIS — Z9889 Other specified postprocedural states: Secondary | ICD-10-CM

## 2023-11-27 DIAGNOSIS — M47816 Spondylosis without myelopathy or radiculopathy, lumbar region: Secondary | ICD-10-CM

## 2023-11-27 DIAGNOSIS — M4726 Other spondylosis with radiculopathy, lumbar region: Secondary | ICD-10-CM

## 2023-11-27 MED ORDER — GABAPENTIN 300 MG PO CAPS: 300.0000 mg | ORAL_CAPSULE | Freq: Two times a day (BID) | ORAL | 2 refills | Status: AC

## 2023-11-27 NOTE — Progress Notes (Signed)
 REFERRING PHYSICIAN:  No referring provider defined for this encounter.  Interpreter IPAD used as patient speaks spanish.  DOS: 06/29/23 Right L4-L5 microdiscectomy  HISTORY OF PRESENT ILLNESS:  Preop right leg pain was gone at his last visit but he continued with LBP. Xrays from 08/15/23 showed compression deformities of T11 and T12.  He is here today with continued back pain.  We did order physical therapy at his last appointment but he is not able to schedule this.  Continues to have back pain that is worse in the morning.  Also has intermittent right lower extremity pain.  He does get some intermittent numbness and tingling.   PHYSICAL EXAMINATION:  General: Patient is well developed, well nourished, calm, collected, and in no apparent distress.   NEUROLOGICAL:  General: In no acute distress.   Awake, alert, oriented to person, place, and time.  Pupils equal round and reactive to light.  Facial tone is symmetric.     Strength:          Side Iliopsoas Quads Hamstring PF DF EHL  R 5 5 5 5 5 5   L 5 5 5 5 5 5    Incision well healed  ROS (Neurologic):  Negative except as noted above  IMAGING:  Narrative & Impression CLINICAL DATA:  Lower back pain, history of prior surgery. Mid back pain for 6 weeks.   EXAM: MRI THORACIC AND LUMBAR SPINE WITHOUT CONTRAST   TECHNIQUE: Multiplanar and multiecho pulse sequences of the thoracic and lumbar spine were obtained without intravenous contrast.   COMPARISON:  MRI lumbar spine 01/11/2023. Lumbar spine radiograph 09/26/2023.   FINDINGS: MRI THORACIC SPINE FINDINGS   Alignment: Thoracic kyphosis is maintained. No significant listhesis.   Vertebrae: No bone marrow edema or evidence of fracture. Mild chronic irregularity of the T10 and T11 superior endplates. Additional mild anterior wedging of T12 with mild irregularity of the superior endplate. No significant endplate irregularity or bone marrow edema at T12. Schmorl's node  involving the T6 inferior endplate. Vertebral body heights otherwise maintained. No suspicious osseous lesion.   Cord:  Normal signal and morphology.   Paraspinal and other soft tissues: Prominent anterior endplate osteophytes particularly at T3-4 and T7-8. The paraspinal soft tissues are unremarkable.   Disc levels:   Disc desiccation at multiple levels. No large disc herniation or evidence of high-grade spinal canal stenosis. There are small disc bulges at T6-7 and T7-8 which indent the ventral thecal sac without contacting the spinal cord. Mild facet arthrosis at multiple levels. No high-grade foraminal stenosis.   MRI LUMBAR SPINE FINDINGS   Segmentation:  Standard.   Alignment: Lumbar lordosis is maintained. No significant listhesis.   Vertebrae: No bone marrow edema or evidence of fracture. Vertebral body heights are maintained. Hemangioma in the S1 vertebra. Bone marrow signal intensity otherwise unremarkable. Postsurgical changes of right hemilaminectomy at L4-5.   Conus medullaris and cauda equina: Conus extends to the L1 level. Fatty filum terminale noted. Conus and cauda equina appear normal.   Paraspinal and other soft tissues: Postsurgical changes in the right paraspinal soft tissues at L4-5. Paraspinal musculature is otherwise unremarkable. Mild bladder wall thickening.   Disc levels:   T12-L1: No significant disc bulge. No significant spinal canal stenosis or foraminal stenosis. Degenerative endplate osteophytes projecting into the left anterolateral paraspinal soft tissues.   L1-2: No significant disc bulge. No significant spinal canal stenosis or foraminal stenosis.   L2-3: Disc desiccation. No significant spinal canal stenosis. Mild facet arthrosis. No  significant foraminal stenosis.   L3-4: Disc desiccation. No significant spinal canal stenosis. Mild facet arthrosis and thickening of the ligamentum flavum. Mild foraminal stenosis on the left.  Paracentral annular fissure noted on sagittal images.   L4-5: Annular fissure and broad-based central disc extrusion with approximately 3 mm cranial migration of disc contents. There is lateral recess narrowing possible impingement upon the traversing nerve roots. Mild facet arthrosis. Mild spinal canal stenosis. No significant foraminal stenosis.   L5-S1: Disc desiccation. Small disc bulge slightly eccentric to the left. Mild facet arthrosis. No significant spinal canal stenosis. No significant foraminal stenosis.   IMPRESSION: MR THORACIC SPINE IMPRESSION   No evidence of acute fracture.   Chronic irregularity of the T10, T11, and T12 superior endplates with mild height loss suggestive of remote compression deformity.   Mild degenerative changes. No high-grade spinal canal or foraminal stenosis.   MR LUMBAR SPINE IMPRESSION   Degenerative changes of the lumbar spine. Similar appearance of central disc extrusion at L4-5 with 3 mm cranial migration. Lateral recess narrowing and mild spinal canal stenosis at L4-5 is similar to prior.   Right paracentral annular fissure at L3-4 and left paracentral annular fissure at L5-S1 again noted.   No high-grade foraminal stenosis.     Electronically Signed   By: Donnice Mania M.D.   On: 11/08/2023 21:14    ASSESSMENT/PLAN:  Elya Diloreto has constant LBP that is worse in the morning.  He was diagnosed with some compression fractures, we also worked him up for any recurrent stenosis.  He does continue to have annular fissures as expected especially in the setting of his disc herniation and surgery.  He does not have any motor weakness or muscle wasting on examination.  He does have some neuropathic symptoms going down his right leg that are continuous.  At his last appointment we requested a physical therapy evaluation and treatment.  We did discuss that down the line he may be a candidate for spinal cord stimulator if he continues  to have persistent pain such as a failed laminectomy chronic back pain syndrome type issue.  At this point his spine does not appear to be unstable.  He has no ongoing compression.  I am awful that he improves with physical therapy, however if he does not we will plan on getting a psychology referral and referral for evaluation of spinal cord stimulator.  Penne MICAEL Sharps, MD Department of neurosurgery

## 2023-11-27 NOTE — Patient Instructions (Addendum)
 LOCAL PHYSICAL THERAPY  [facility]  [address]  [place], [place] [address]  [phone]   [facility]  [address] [place], [place] [address]  [phone]   [facility] (2 locations)  [address]  [place], [place] [address]  [phone]          or  [address]  [place], [place] [address]  [phone]   [facility]  [address]  [place], [place] [address]  [phone]  **dry needling**   [facility] [place] [facility]  [address]  [place], [place] [address]  [phone]  Fax: [phone]  ** Aquatic therapy[address] [place], [place] [address] [phone] **Aquatic therapy**  [facility] [address] [place], [place] [address] [phone]  [facility]  [address]  [place], [place] [address]  [phone]   [facility]  [address]  [place], [place] [address]  [phone]   [facility]  [address]   Janora Norlander  [place], [place] [address]  [phone]   [facility]  [address]  [place], [place] [address]  [phone]  **dry needling**   PELVIC FLOOR/SI JOINT  [facility]  Mariane Masters, PT  [email or web address]   [place]   [facility]  [address]  Suite [place], [place] [address]  [phone]   [facility] [place]  [address], [place] [address]  [phone]   [facility] [address] [place], [place] [address]  Octavio Manns, [place]   [facility]  Raymond Gurney, PT  [address]  [place], [place] [address] [phone]   Samara Deist   [facility]  [address]   [phone]   [facility]  [address]  [phone]   [facility]  Annamaria Boots [facility]  [address]  [place], [place] [address]  [phone]  **No Aetna or medicaid**   [facility]  [phone] [address] [place] [address]  [phone]   [facility]  [address]  [place], [place] [address]  [phone]   Dannial Monarch, DC  [address], [place] [address]  [phone]   [facility]  [address], [place] [address]  [phone]   [facility]  [address]  [place], [place] [address]  [phone]  [facility]  (3 locations)  [address]  [place], [place] [address]  [phone]  **dry needling**           or  [address] [place], [place] [address]  [phone]  **Additionally has Gloris Manchester, OT**           or  [address]  [place], [place] [address]  [phone]  **Pediatric therapy**  [facility]  [address]  [phone]  **dry needlingVerdie Drown [facility]  [address]  [place], [place] [address]  [phone]  [facility]   (Inside [address] Fitness)  [address]  [place], [place] [address]  [phone]  **dry needling**  **MEDICAID  or UNINSURED** [facility] [employer] [place], [place] [address] [phone]  [facility]  [address] [place], [place] [address]  [phone]   [facility]  [address]  [place], [place] [address]  [phone]   [facility]  [address] [place], [place] [address]  [phone]   [facility]  [address]  [place], [place] [address]  [phone]   [facility]  [address]  [place], [place] [address]  [phone]   [facility]  [address]  [place], [place] [address]  [phone]   AQUATIC   Kathalene Frames [facility]   [employer]  [facility]  [place]   [place]  *Residents only*  [facility] [employer]  *[facility]idents [facility]  Exercise class  [facility]  Exercise class   [facility]  [address]  [place], [place]   [phone]   [facility]  [address]  [place], [place] [address]  [phone]   [place], [place]  [facility]  [address]  [phone]   [facility]  [address]  [phone]           or  [address]  [phone]   [facility]   Provides education and support and practical information for coping with arthritis for arthritis sufferers and their families.   When: 12:15 - 1:30 p.m. the second Monday of each month, March through December  Info: Call Rehabilitation Services at [phone]

## 2023-11-29 ENCOUNTER — Other Ambulatory Visit: Payer: Self-pay

## 2023-11-29 DIAGNOSIS — E119 Type 2 diabetes mellitus without complications: Secondary | ICD-10-CM

## 2023-12-12 ENCOUNTER — Telehealth: Payer: Self-pay

## 2023-12-12 NOTE — Progress Notes (Signed)
 Complex Care Management Note  Care Guide Note 12/12/2023 Name: James Yates MRN: [id number] DOB: [birth year]  [name] is a 68 y.o. year old male who sees Gretel App, NP for primary care. I reached out to Ryan Parker by phone today to offer complex care management services.  [name] was given information about Complex Care Management services today including:   The Complex Care Management services include support from the care team which includes your Nurse Care Manager, Clinical Social Worker, or Pharmacist.  The Complex Care Management team is here to help remove barriers to the health concerns and goals most important to you. Complex Care Management services are voluntary, and the patient may decline or stop services at any time by request to their care team member.   Complex Care Management Consent Status: Patient agreed to services and verbal consent obtained.   Follow up plan:  Telephone appointment with complex care management team member scheduled for:  12/18/23 at 9:00 a.m.  Encounter Outcome:  Patient Scheduled  Dreama [facility]  [facility] Health Care Management Assistant Direct Dial: [phone]  Fax: [phone]

## 2023-12-18 ENCOUNTER — Telehealth: Payer: Self-pay

## 2023-12-18 ENCOUNTER — Telehealth: Payer: Self-pay | Admitting: Orthopedic Surgery

## 2023-12-18 ENCOUNTER — Other Ambulatory Visit (HOSPITAL_COMMUNITY): Payer: Self-pay

## 2023-12-18 ENCOUNTER — Other Ambulatory Visit (INDEPENDENT_AMBULATORY_CARE_PROVIDER_SITE_OTHER): Admitting: Pharmacist

## 2023-12-18 DIAGNOSIS — M47816 Spondylosis without myelopathy or radiculopathy, lumbar region: Secondary | ICD-10-CM

## 2023-12-18 DIAGNOSIS — M5416 Radiculopathy, lumbar region: Secondary | ICD-10-CM | POA: Diagnosis not present

## 2023-12-18 DIAGNOSIS — J849 Interstitial pulmonary disease, unspecified: Secondary | ICD-10-CM

## 2023-12-18 DIAGNOSIS — Z9889 Other specified postprocedural states: Secondary | ICD-10-CM

## 2023-12-18 DIAGNOSIS — Z4789 Encounter for other orthopedic aftercare: Secondary | ICD-10-CM | POA: Diagnosis not present

## 2023-12-18 DIAGNOSIS — M545 Low back pain, unspecified: Secondary | ICD-10-CM | POA: Diagnosis not present

## 2023-12-18 NOTE — Telephone Encounter (Signed)
 New PT [facility] to [facility].

## 2023-12-18 NOTE — Progress Notes (Signed)
   12/18/2023 Name: James Yates MRN: [id number] DOB: 1956/05/29  Subjective  Chief Complaint  Patient presents with   Medication Access    Due to language barrier, an interpreter was present during the history-taking and subsequent discussion (and for part of the physical exam) with this patient. ID: [id number]  Care Team: Primary Care Provider: Gretel App, NP  Reason for visit: ?  James Yates is a 68 y.o. male who presents today for a telephone visit with the pharmacist due to medication access concerns regarding cost of medication. ?  Recent PCP visit on 11/08/23. Unable to afford Symbicort  and needs an affordable inhaler. Patient to contact pulmonology for alternative, affordable inhaler options.    Medication Access: ?  Reports that all medications are not affordable.  He was prescribed an inhaler by pulmonology, but he could not purchase it due to the high cost. He mentions that his insurance does not cover much of his medication costs, and his retirement income is insufficient to cover these expenses.  Has not contacted pulmonology at this time regarding inhaler alternatives.   Prescription drug coverage: YES Payor: Multimedia programmer / Plan: UHC MEDICARE / Product Type: *No Product type* / .  Summary of Benefit: N/A no insurance card on file  Current Patient Assistance: N/A  Patient lives in a household of 2 with his wife. Patient is retired, wife still works. Son also lives with him though is not a legal dependent. Estimated personal income $11k/yr. Wife estimated $20-22k.   Medicare LIS Eligible: Unclear, unsure of personal income.  Borderline eligibility though may exceed assets limit.    Assessment and Plan:   1. Medication Access (ILD) Symbicort  not affordable. Is generic - no patient assistance available.  Called pharmacy. They still have previous insurance plan on file [facility]) which patient no longer has coverage through. They were able to pull new  insurance information for [facility] though could not get a successful claim non-matched cardholder ID. Test claim requested via [facility] team: Symbicort  (brand and/or generic)       Alternative options: Has not contacted pulmonology regarding appropriate alternatives for ILD management.  No formoterol /ICS combinations available through patient assistance, though could possibly consider Breztri with addition of LAMA if deemed appropriate by pulmonology for ILD management. Note forwarded to Pulmonology provider: Malka Domino, MD   Patient Assistance Considerations: [place]: 300% FPL LAMA/LABA/ICS    [name] (glycopyrrolate/formoterol /budesonide ) SABA-ICS     Airspura (albuterol /budesonide )  Patient preference: Signature at [facility] office if plan for PAP inhaler   Future Appointments  Date Time Provider Department Center  12/19/2023  9:15 AM Abigail Bernardino HERO, PA-C [facility] None  02/01/2024  3:00 PM [facility] PFT [facility] None  02/01/2024  4:00 PM Assaker, Domino, MD [facility] None  02/13/2024 11:20 AM Gretel App, NP [facility]    James Yates, PharmD Clinical Pharmacist [facility] [phone]

## 2023-12-18 NOTE — Telephone Encounter (Signed)
-----   Message from Kaufman B sent at 12/18/2023  3:06 PM EDT ----- Regarding: new referral [facility] called and they are needing a new PT referral placed- unable to use the one from March

## 2023-12-18 NOTE — Telephone Encounter (Signed)
 Pharmacy Patient Advocate Encounter  Insurance verification completed.   The patient is insured through [employer] claim for Symmbicort. Currently a quantity of 10.2g is a 30 day supply and the co-pay is $241.62 .   This test claim was processed through [facility]- copay amounts may vary at other pharmacies due to pharmacy/plan contracts, or as the patient moves through the different stages of their insurance plan.

## 2023-12-19 ENCOUNTER — Ambulatory Visit: Attending: Physician Assistant | Admitting: Physician Assistant

## 2023-12-19 ENCOUNTER — Encounter: Payer: Self-pay | Admitting: Physician Assistant

## 2023-12-19 VITALS — BP 120/78 | HR 73 | Ht 65.0 in | Wt 202.2 lb

## 2023-12-19 DIAGNOSIS — I10 Essential (primary) hypertension: Secondary | ICD-10-CM | POA: Diagnosis not present

## 2023-12-19 DIAGNOSIS — I25118 Atherosclerotic heart disease of native coronary artery with other forms of angina pectoris: Secondary | ICD-10-CM

## 2023-12-19 DIAGNOSIS — E785 Hyperlipidemia, unspecified: Secondary | ICD-10-CM

## 2023-12-19 DIAGNOSIS — Z758 Other problems related to medical facilities and other health care: Secondary | ICD-10-CM | POA: Diagnosis not present

## 2023-12-19 DIAGNOSIS — I350 Nonrheumatic aortic (valve) stenosis: Secondary | ICD-10-CM

## 2023-12-19 DIAGNOSIS — Z952 Presence of prosthetic heart valve: Secondary | ICD-10-CM | POA: Diagnosis not present

## 2023-12-19 DIAGNOSIS — Z951 Presence of aortocoronary bypass graft: Secondary | ICD-10-CM | POA: Diagnosis not present

## 2023-12-19 DIAGNOSIS — Z603 Acculturation difficulty: Secondary | ICD-10-CM

## 2023-12-19 NOTE — Progress Notes (Signed)
 Cardiology Office Note    Date:  12/19/2023   ID:  James Yates, DOB [birth year], MRN [id number]  PCP:  Gretel App, NP  Cardiologist:  Deatrice Cage, MD  Electrophysiologist:  None   Chief Complaint: Follow-up  History of Present Illness:   James Yates is a 68 y.o. male with history of CAD s/p CABG in 06/2013 following a NSTEMI with chronic angina, bicuspid aortic valve with severe stenosis s/p TAVR in 02/2018, paroxysmal SVT, ILD, HTN, HLD, and RBBB who presents for follow up of CAD.   LHC in 10/2015 showed severe underlying 3-vessel CAD with a patent LIMA-LAD, and SVG-RCA. The SVG-LCx was occluded. The native LCx was occluded proximally with faint collaterals. There was moderate aortic stenosis with a peak to peak gradient of 22 mmHg. LHC was reviewed with our CTO team with the opinion being the LCx was not optimal for PCI. Echo in 09/2017 showed an EF of 60-65%, normal wall motion, progression of his aortic valve stenosis to severe with a peak velocity of 422 cm/s, mean gradient of 38 mmHg, and apeak gradient of 71 mmHg, left atrium was normal in size, RVSF normal, PASP 46 mmHg. He was seen on 11/23/2017 with worsening exertional chest tightness. He underwent R/LHC on 11/27/2017 that showed significant underlying 3-vessel CAD with a patent LIMA to LAD (could not be selectively engaged), patent SVG to RCA and known occluded SVG to OM3 along with an occluded LCx. There was moderate to severe aortic stenosis with a mean gradient of 26 mmHg and a valve area of 1.2. However, his aortic stenosis was felt to be likely severe based on echo findings and his valve area noted on 11/27/2017 was felt to be overestimated due to high cardiac output. Valve area of echo noted to be 0.6. The aortic valve morphology was also consistent with severe aortic stenosis. RHC showed mildly elevated filling pressures with mild pulmonary hypertension. He subsequently underwent TAVR in 02/2018. Echo from 02/2019 showed an  EF of 60-65%, normal RVSF and ventricular cavity size, normal PASP, and a normal functioning bioprosthetic aortic valve with a mean gradient of 10 mmHg.  He underwent Zio in 2021 patch monitoring which showed a predominant rhythm of sinus with an average heart rate of 89 bpm, IVCD was noted, 10 episodes of SVT with the longest episode lasting 16 beats with an average heart rate of 152 bpm, rare PACs and PVCs.  Some patient triggered events correlated with PVCs and sinus tachycardia.  Echo in 03/2020 showed an EF of 50 to 55%, no regional wall motion abnormalities, normal LV diastolic function parameters, normal RV systolic function and ventricular cavity size, and normal structure and function of aortic valve prosthesis.     He was seen in the office in 05/2021 and had lost his health insurance briefly.  In this setting, he had ran out of medications.  Given this, he was not taking his antihypertensive medications and reported worsening chest pain and shortness of breath.  Symptoms were felt to be in the setting of being off antianginal medications.  At time of his visit, he was back under health insurance.   He was seen on 02/02/2022 continuing to note some exertional chest discomfort that was consistent what he was feeling at his visit in early 2023.  He also continued to note unchanged and longstanding palpitations.  Lexiscan  MPI on 02/10/2022 was overall low risk with no significant ischemia with a small region of fixed defect in the apical, distal anterior  apical, and inferior apical region and an EF of 46%.  CT attenuated corrected images showed a mild aortic atherosclerosis, moderate coronary artery calcification, and TAVR valve.  Echo on 04/18/2022 demonstrated an EF of 55 to 60%, no regional wall motion abnormalities, indeterminate LV diastolic function parameters, low normal RV systolic function with normal ventricular cavity size, normal structure and function of aortic valve prosthesis, and an estimated  right atrial pressure of 3 mmHg.   He was seen in 04/2022 with shoulder pain, headache, and intermittent bleeding from the gums.  Amlodipine  was discontinued to see if this would help with his gingival bleeding.  He was prescribed ranolazine , though could not afford the medication.  He was started on Imdur , though this worsened his headache.  He was seen in the office in 07/2022, and he continued to report stable exertional chest pain and shortness of breath with overexertion.  Symptoms were felt to be likely due to occluded LCx and graft to OM 3.  Imdur  was discontinued due to headache.  Losartan  was titrated to 100 mg daily with continuation of metoprolol .  Rosuvastatin  was titrated to 40 mg daily.   He was seen in the office on 01/17/2023 and reported his chest discomfort was improved from prior, though did continue to have some exertional shortness of breath and chest discomfort.  Discontinuation of Imdur  did not improve headache.  Amlodipine  was titrated to 10 mg daily with continuation of Lopressor  100 mg twice daily.   He was seen in the ED on 01/24/2023 with multiple complaints including chest pressure lasting for several hours improved with nitroglycerin  as well as headache, dizziness, and imbalance.  High-sensitivity troponin 29 with a delta troponin 26.  Chest x-ray without active cardiopulmonary disease.  CTA of the head and neck showed no emergent large vessel occlusion or hemodynamically significant stenosis of the head/neck with mild bilateral carotid bifurcation atherosclerosis without hemodynamically significant stenosis.  CTA chest showed no acute intrathoracic pathology or PE.  MRI of the brain was unrevealing.     He was seen in the office in 01/2023 and continued to note intermittent chest discomfort that was largely unchanged compared to his prior visits.  He continued to have discomfort despite improvement in blood pressure readings.  He also reported exertional shortness of breath and  fatigue.  Given persistent symptoms, he underwent LHC on 02/20/2023 which showed significant underlying three-vessel CAD with patent LIMA to LAD and patent SVG to RPDA.  Chronically occluded proximal LCx with bridging collaterals and right to left collaterals with known chronically occluded SVG to OM was also noted.  No significant gradient across the TAVR prosthesis by pullback.  High normal LVEDP at 13 mmHg.  There was no significant change in coronary anatomy since most recent cardiac cath in 2019.  The LCx was well collateralized.  Medical therapy was recommended.   He was last seen in the office in 05/2023 and reported improved symptoms with improved blood pressure control.  He comes in today and is currently without symptoms of angina or cardiac decompensation.  He continues to note longstanding exertional chest pressure and shortness of breath that will occur after taking 2-3 steps and resolves within 1 minute of resting.  Symptoms are longstanding, though do seem to be a little more pronounced when compared to his last visit.  No significant lower extremity swelling, progressive orthopnea, or abdominal distention.  No dizziness, presyncope, or syncope.  Currently undergoing evaluation by pulmonology for interstitial lung disease.   Labs independently  reviewed: 10/2023 - A1c 6.3 09/2023 - BNP 34 07/2023 - direct LDL 141, TC 260, TG 460, TSH normal, potassium 3.3, BUN 14, serum creatinine 0.85, albumin  4.7, AST/ALT normal, Hgb 16.1, PLT 198  Past Medical History:  Diagnosis Date   Aortic atherosclerosis (HCC)    Aortic stenosis due to bicuspid aortic valve    a.) TTE 01/26/15: mild-mod AS (MPG 19; VTI 1.18); b.) TTE 07/20/15: mild AS (MPG 13; VTI 1.06); c.) LHC 10/29/15: mod AS (peak-peak grad 22); d.) TTE 08/23/16: mod AS (MPG 21; VTI 0.93); e.) TTE 10/10/17: sev AS (MPG 38; VTI 0.6); f.) R/LHC 11/27/17: mod-sev AS (MPG 26; VTI 1.2); g.) TTE 02/27/18: mod AS (MPG 30; VTI 0.52); h.) TTE 03/13/18:  sev AS (MPG 33; VTI 0.94); i.) s/p TAVR 03/13/2018   Bilateral carotid artery stenosis    a.) doppler 02/27/2018: 1-39% BICA   Cervical radiculopathy    CHF (congestive heart failure) (HCC)    Coronary artery disease    a.) NSTEMI 07/02/2013 --> 4v CABG 07/05/2013 (LIMA-LAD, SVG-PDA, SVG-OM1, SVG-OM3); b.) MV 10/19/2015: LAD territory isch; c.) LHC 10/29/2015: 30% pRCA, 100% mRCA, 90% o-mLAD, 70% oD1-D1, 30% dLAD, 100% oD2-D2, 100% pLCx, 100% SVG-OM3 -- med mgmt; d.   GERD (gastroesophageal reflux disease)    Glucose intolerance (impaired glucose tolerance)    Headache    chronic, no red flag or atypical signs or symptoms   Hyperlipidemia    Hypertension    Lumbar radiculopathy    Lumbar spondylosis    NSTEMI (non-ST elevated myocardial infarction) (HCC) 07/02/2013   a.) LHC 07/02/2013: 80% pLAD, 90% D1, 95% pLCx, 99% mLCx, 90% OM2, 60% OM3, 40% left AV groove, 50% pRCA, 40% dRCA, 90% RPL1 --> CVTS; b.) s/p 4v CABG 07/05/2013   Obesity    Onychomycosis    RBBB (right bundle branch block)    S/P CABG x 4 07/05/2013   a.) LIMA-LAD, SVG-PDA, SVG-OM1, SVG-OM3   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   a.) 26 mm Edwards Sapien 3 transcatheter heart valve placed via percutaneous right transfemoral approach    Past Surgical History:  Procedure Laterality Date   CARDIAC CATHETERIZATION  06/30/2013   [facility]   CARDIAC CATHETERIZATION N/A 10/29/2015   Procedure: Left Heart Cath and Cors/Grafts Angiography;  Surgeon: Deatrice DELENA Cage, MD;  Location: [facility] INVASIVE CV LAB;  Service: Cardiovascular;  Laterality: N/A;   CORONARY ARTERY BYPASS GRAFT N/A 07/05/2013   Procedure: CORONARY ARTERY BYPASS GRAFTING (CABG);  Surgeon: Maude Fleeta Ochoa, MD;  Location: [facility] OR;  Service: Open Heart Surgery;  Laterality: N/A;   INTRAOPERATIVE TRANSESOPHAGEAL ECHOCARDIOGRAM N/A 07/05/2013   Procedure: INTRAOPERATIVE TRANSESOPHAGEAL ECHOCARDIOGRAM;  Surgeon: Maude Fleeta Ochoa, MD;  Location: [facility]  OR;  Service: Open Heart Surgery;  Laterality: N/A;   INTRAOPERATIVE TRANSTHORACIC ECHOCARDIOGRAM N/A 03/13/2018   Procedure: INTRAOPERATIVE TRANSTHORACIC ECHOCARDIOGRAM;  Surgeon: Verlin Lonni BIRCH, MD;  Location: [facility] OR;  Service: Open Heart Surgery;  Laterality: N/A;   LEFT HEART CATH AND CORS/GRAFTS ANGIOGRAPHY N/A 02/20/2023   Procedure: LEFT HEART CATH AND CORS/GRAFTS ANGIOGRAPHY;  Surgeon: Cage Deatrice DELENA, MD;  Location: [facility] INVAS[facility] CV LAB;  Service: Cardiovascular;  Laterality: N/A;   LUMBAR LAMINECTOMY/DECOMPRESSION MICRODISCECTOMY Right 06/29/2023   Procedure: RIGHT L4-5 HEMILAMINECTOMY AND DISCECTOMY;  Surgeon: Claudene Penne ORN, MD;  Location: [facility] ORS;  Service: Neurosurgery;  Laterality: Right;   RIGHT/LEFT HEART CATH AND CORONARY ANGIOGRAPHY Bilateral 11/27/2017   Procedure: RIGHT/LEFT HEART CATH AND CORONARY ANGIOGRAPHY;  Surgeon: Cage Deatrice DELENA, MD;  Location: [facility] INVASIVE CV LAB;  Service: Cardiovascular;  Laterality: Bilateral;   TRANSCATHETER AORTIC VALVE REPLACEMENT, TRANSFEMORAL N/A 03/13/2018   Procedure: TRANSCATHETER AORTIC VALVE REPLACEMENT, TRANSFEMORAL. Edwards Sapien 3 Transcatheter Heart Valve size 26mm.;  Surgeon: Verlin Lonni BIRCH, MD;  Location: [facility] OR;  Service: Open Heart Surgery;  Laterality: N/A;    Current Medications: Current Meds  Medication Sig   amLODipine  (NORVASC ) 10 MG tablet Take 1 tablet (10 mg total) by mouth daily.   aspirin  EC 81 MG tablet Take 1 tablet (81 mg total) by mouth daily. Resume 07/01/22   budesonide -formoterol  (SYMBICORT ) 160-4.5 MCG/ACT inhaler Inhale 2 puffs into the lungs 2 (two) times daily.   cyclobenzaprine  (FLEXERIL ) 5 MG tablet Take 1 tablet (5 mg total) by mouth 3 (three) times daily as needed for muscle spasms.   gabapentin  (NEURONTIN ) 300 MG capsule Take 1 capsule (300 mg total) by mouth 2 (two) times daily.   losartan  (COZAAR ) 100 MG tablet Take 1 tablet (100 mg total) by mouth daily.   metFORMIN   (GLUCOPHAGE -XR) 500 MG 24 hr tablet Take 1 tablet (500 mg total) by mouth daily with breakfast.   metoprolol  tartrate (LOPRESSOR ) 100 MG tablet Take 1 tablet (100 mg total) by mouth 2 (two) times daily.   pantoprazole  (PROTONIX ) 40 MG tablet Take 1 tablet (40 mg total) by mouth daily.   rosuvastatin  (CRESTOR ) 40 MG tablet Take 1 tablet (40 mg total) by mouth daily.   sertraline  (ZOLOFT ) 100 MG tablet Take 1 tablet (100 mg total) by mouth daily.    Allergies:   Patient has no known allergies.   Social History   Socioeconomic History   Marital status: Married    Spouse name: Aurelia   Number of children: 3   Years of education: Not on file   Highest education level: Not on file  Occupational History   Not on file  Tobacco Use   Smoking status: Never    Passive exposure: Never   Smokeless tobacco: Never  Vaping Use   Vaping status: Never Used  Substance and Sexual Activity   Alcohol use: No   Drug use: No   Sexual activity: Not on file  Other Topics Concern   Not on file  Social History Narrative   Lives with wife at home and son Sherlean    Social Drivers of Corporate investment banker Strain: Not on file  Food Insecurity: Not on file  Transportation Needs: Not on file  Physical Activity: Not on file  Stress: Not on file  Social Connections: Not on file     Family History:  The patient's family history includes Heart attack in his father; Heart disease in his father.  ROS:   12-point review of systems is negative unless otherwise noted in the HPI.   EKGs/Labs/Other Studies Reviewed:    Studies reviewed were summarized above. The additional studies were reviewed today:  LHC 02/20/2023:   Dist LAD lesion is 30% stenosed.   Ost LAD to Mid LAD lesion is 80% stenosed.   Prox Cx to Mid Cx lesion is 100% stenosed.   Prox RCA lesion is 30% stenosed.   Mid RCA lesion is 100% stenosed.   Prox Graft lesion before Ramus  is 100% stenosed.   Ost 1st Diag to 1st Diag  lesion is 70% stenosed.   Ost 2nd Diag to 2nd Diag lesion is 100% stenosed.   Origin to Prox Graft lesion is 100% stenosed.   SVG graft was visualized by angiography  and is normal in caliber.   LIMA graft was visualized by angiography and is normal in caliber.   The graft exhibits no disease.   The graft exhibits no disease.   1.  Significant underlying three-vessel coronary artery disease with patent LIMA to LAD and patent SVG to right PDA.  Chronically occluded proximal left circumflex with bridging collaterals and right to left collaterals with known chronically occluded SVG to OM. 2.  No significant gradient across the TAVR prosthesis by pullback. 3.  Left ventricular angiography was not performed.  EF was normal by echo.  High normal LVEDP at 13 mmHg.   Recommendations: No significant change in coronary anatomy since most recent cardiac catheterization.  The left circumflex is well collateralized.  Recommend continuing medical therapy. __________   2D echo 04/18/2022: 1. Left ventricular ejection fraction, by estimation, is 55 to 60%. The  left ventricle has normal function. The left ventricle has no regional  wall motion abnormalities. Left ventricular diastolic parameters are  indeterminate. The average left  ventricular global longitudinal strain is -22.8 %. The global longitudinal  strain is normal.   2. Right ventricular systolic function is low normal. The right  ventricular size is normal.   3. The mitral valve is normal in structure. No evidence of mitral valve  regurgitation.   4. The aortic valve has been repaired/replaced. Aortic valve  regurgitation is not visualized. Echo findings are consistent with normal  structure and function of the aortic valve prosthesis. Aortic valve mean  gradient measures 11.0 mmHg.   5. The inferior vena cava is normal in size with greater than 50%  respiratory variability, suggesting right atrial pressure of 3 mmHg.   Comparison(s):  Echocardiogram done 04/16/20 showed an EF of 50-55%.  __________   Lexiscan  MPI 02/10/2022: Pharmacological myocardial perfusion imaging study with no significant  ischemia Small region fixed defect in the apical, distal anterior apical, inferior apical region, unable to exclude small region prior MI Normal wall motion, EF estimated at 46% No EKG changes concerning for ischemia at peak stress or in recovery. CT attenuation correction images with mild aortic atherosclerosis and moderate coronary calcification, TAVR valve noted Low risk scan __________   Zio patch 03/2020: Normal sinus rhythm with an average heart rate 89 bpm.  IVCD. 10 episodes of supraventricular tachycardia.  The longest lasted 16 beats with an average heart rate of 152 bpm. Rare PACs and rare PVCs. Some triggered events correlated with PVCs and sinus tachycardia. __________   2D echo 03/2020: 1. Left ventricular ejection fraction, by estimation, is 50 to 55%. Left  ventricular ejection fraction by 3D volume is 53 %. The left ventricle has  low normal function. The left ventricle has no regional wall motion  abnormalities. Left ventricular  diastolic parameters were normal.   2. Right ventricular systolic function is normal. The right ventricular  size is normal.   3. The mitral valve is normal in structure. No evidence of mitral valve  regurgitation.   4. The aortic valve has been repaired/replaced. Aortic valve  regurgitation is not visualized. Echo findings are consistent with normal  structure and function of the aortic valve prosthesis. ___________   See Epic for remaining cardiac studies   EKG:  EKG is ordered today.  The EKG ordered today demonstrates NSR, 73 bpm, RBBB, prior inferior infarct, consistent with prior tracing  Recent Labs: 01/24/2023: Magnesium  2.2 08/08/2023: ALT 23; BUN 14; Creatinine, Ser 0.85; Hemoglobin 16.1; Platelets 198.0; Potassium 3.3; Sodium 139; TSH  2.18 10/13/2023: B  Natriuretic Peptide 34.8  Recent Lipid Panel    Component Value Date/Time   CHOL 260 (H) 08/08/2023 1437   CHOL 245 (H) 02/14/2023 1206   CHOL 215 (H) 07/02/2013 0058   TRIG (H) 08/08/2023 1437    460.0 Triglyceride is over 400; calculations on Lipids are invalid.   TRIG 143 07/02/2013 0058   HDL 44.10 08/08/2023 1437   HDL 41 02/14/2023 1206   HDL 38 (L) 07/02/2013 0058   CHOLHDL 6 08/08/2023 1437   VLDL 92.0 (H) 08/08/2023 1437   VLDL 29 07/02/2013 0058   LDLCALC 162 (H) 02/14/2023 1206   LDLCALC 148 (H) 07/02/2013 0058   LDLDIRECT 141.0 08/08/2023 1437    PHYSICAL EXAM:    VS:  BP 120/78 (BP Location: Left Arm, Patient Position: Sitting, Cuff Size: Normal)   Pulse 73   Ht 5' 5 (1.651 m)   Wt 202 lb 3.2 oz (91.7 kg)   SpO2 98%   BMI 33.65 kg/m   BMI: Body mass index is 33.65 kg/m.  Physical Exam Vitals reviewed.  Constitutional:      Appearance: He is well-developed.  HENT:     Head: Normocephalic and atraumatic.  Eyes:     General:        Right eye: No discharge.        Left eye: No discharge.  Cardiovascular:     Rate and Rhythm: Normal rate and regular rhythm.     Pulses:          Posterior tibial pulses are 2+ on the right side and 2+ on the left side.     Heart sounds: S1 normal and S2 normal. Heart sounds not distant. No midsystolic click and no opening snap. Murmur heard.     Systolic murmur is present with a grade of 2/6 at the upper right sternal border.     No friction rub.  Pulmonary:     Effort: Pulmonary effort is normal. No respiratory distress.     Breath sounds: Normal breath sounds. No decreased breath sounds, wheezing, rhonchi or rales.  Chest:     Chest wall: No tenderness.  Musculoskeletal:     Cervical back: Normal range of motion.     Right lower leg: No edema.     Left lower leg: No edema.  Skin:    General: Skin is warm and dry.     Nails: There is no clubbing.  Neurological:     Mental Status: He is alert and oriented to  person, place, and time.  Psychiatric:        Speech: Speech normal.        Behavior: Behavior normal.        Thought Content: Thought content normal.        Judgment: Judgment normal.     Wt Readings from Last 3 Encounters:  12/19/23 202 lb 3.2 oz (91.7 kg)  11/27/23 202 lb (91.6 kg)  11/08/23 199 lb 6.4 oz (90.4 kg)     ASSESSMENT & PLAN:   CAD status post CABG with stable angina: Currently without symptoms of angina or cardiac decompensation.  LHC in 01/2023 was unchanged from cath in 2019 with significant underlying three-vessel CAD with patent LIMA to LAD and patent SVG to RPDA.  Chronically occluded proximal LCx with bridging collaterals and right to left collaterals with known chronically occluded SVG to OM.  He reports a progression in exertional dyspnea and chest pressure.  Schedule myocardial PET/CT to  evaluate for significant ischemia and for small vessel disease.  If this is reassuring, do not anticipate further ischemic testing at this time with recommendation to continue current medical therapy including aspirin , amlodipine , metoprolol , and rosuvastatin .  Not currently on Imdur  out of concern for this exacerbating his chronic headache.  Not on ranolazine  due to financial constraints.  Severe aortic stenosis status post TAVR: Echo in 03/2022 showed normal prosthetic valve structure and function.  Remains on aspirin .  SBE prophylaxis indicated for all dental procedures.  HTN: Blood pressure is well-controlled in the office today.  Remains on amlodipine , losartan , and Lopressor .  Low-sodium diet recommended.  HLD: LDL 141.  Reports adherence to rosuvastatin , though is no longer on ezetimibe  (added in 02/2023).  In follow-up, recommend repeating lipid panel with consideration for PCSK9 inhibitor as indicated, if not cost prohibitive.  Language barrier: [place] medical interpreter was utilized for today's visit.  ILD: Likely contributing to underlying shortness of breath.   Ongoing management per pulmonology.   Informed Consent   Shared Decision Making/Informed Consent{  The risks [chest pain, shortness of breath, cardiac arrhythmias, dizziness, blood pressure fluctuations, myocardial infarction, stroke/transient ischemic attack, nausea, vomiting, allergic reaction, radiation exposure, metallic taste sensation and life-threatening complications (estimated to be 1 in 10,000)], benefits (risk stratification, diagnosing coronary artery disease, treatment guidance) and alternatives of a cardiac PET stress test were discussed in detail with Mr. Hermiz and he agrees to proceed.        Disposition: F/u with Dr. Darron or an APP in 2 months.   Medication Adjustments/Labs and Tests Ordered: Current medicines are reviewed at length with the patient today.  Concerns regarding medicines are outlined above. Medication changes, Labs and Tests ordered today are summarized above and listed in the Patient Instructions accessible in Encounters.   Signed, Bernardino Bring, PA-C 12/19/2023 10:06 AM     [facility] [place] [address] [place], [place] [address] [phone]

## 2023-12-19 NOTE — Patient Instructions (Signed)
 Medication Instructions:  Your physician recommends that you continue on your current medications as directed. Please refer to the Current Medication list given to you today.   *If you need a refill on your cardiac medications before your next appointment, please call your pharmacy*  Lab Work: None ordered at this time  If you have labs (blood work) drawn today and your tests are completely normal, you will receive your results only by: MyChart Message (if you have MyChart) OR A paper copy in the mail If you have any lab test that is abnormal or we need to change your treatment, we will call you to review the results.  Testing/Procedures:    Please report to Radiology at the [facility] Main Entrance 30 minutes early for your test.  [address] [place], [place] [address]                         OR   Please report to Radiology at [facility] Main Entrance, medical mall, 30 mins prior to your test.  [address]  [place], [place]  How to Prepare for Your Cardiac PET/CT Stress Test:  Nothing to eat or drink, except water , 3 hours prior to arrival time.  NO caffeine/decaffeinated products, or chocolate 12 hours prior to arrival. (Please note decaffeinated beverages (teas/coffees) still contain caffeine).  If you have caffeine within 12 hours prior, the test will need to be rescheduled.  Medication instructions: Do not take erectile dysfunction medications for 72 hours prior to test (sildenafil, tadalafil) Do not take nitrates (isosorbide  mononitrate, Ranexa ) the day before or day of test Do not take tamsulosin the day before or morning of test Hold theophylline containing medications for 12 hours. Hold Dipyridamole 48 hours prior to the test.  Diabetic Preparation: If able to eat breakfast prior to 3 hour fasting, you may take all medications, including your insulin . Do not worry if you miss your breakfast dose of insulin  - start at your  next meal. If you do not eat prior to 3 hour fast-Hold all diabetes (oral and insulin ) medications. Patients who wear a continuous glucose monitor MUST remove the device prior to scanning.  You may take your remaining medications with water .  NO perfume, cologne or lotion on chest or abdomen area. FEMALES - Please avoid wearing dresses to this appointment.  Total time is 1 to 2 hours; you may want to bring reading material for the waiting time.  IF YOU THINK YOU MAY BE PREGNANT, OR ARE NURSING PLEASE INFORM THE TECHNOLOGIST.  In preparation for your appointment, medication and supplies will be purchased.  Appointment availability is limited, so if you need to cancel or reschedule, please call the Radiology Department Scheduler at [phone] 24 hours in advance to avoid a cancellation fee of $100.00  What to Expect When you Arrive:  Once you arrive and check in for your appointment, you will be taken to a preparation room within the Radiology Department.  A technologist or Nurse will obtain your medical history, verify that you are correctly prepped for the exam, and explain the procedure.  Afterwards, an IV will be started in your arm and electrodes will be placed on your skin for EKG monitoring during the stress portion of the exam. Then you will be escorted to the PET/CT scanner.  There, staff will get you positioned on the scanner and obtain a blood pressure and EKG.  During the exam, you  will continue to be connected to the EKG and blood pressure machines.  A small, safe amount of a radioactive tracer will be injected in your IV to obtain a series of pictures of your heart along with an injection of a stress agent.    After your Exam:  It is recommended that you eat a meal and drink a caffeinated beverage to counter act any effects of the stress agent.  Drink plenty of fluids for the remainder of the day and urinate frequently for the first couple of hours after the exam.  Your doctor will  inform you of your test results within 7-10 business days.  For more information and frequently asked questions, please visit our website: [email or web address]  For questions about your test or how to prepare for your test, please call: Cardiac Imaging Nurse Navigators Office: [phone]   Follow-Up: At [facility], you and your health needs are our priority.  As part of our continuing mission to provide you with exceptional heart care, our providers are all part of one team.  This team includes your primary Cardiologist (physician) and Advanced Practice Providers or APPs (Physician Assistants and Nurse Practitioners) who all work together to provide you with the care you need, when you need it.  Your next appointment:   2 month(s)  Provider:   You may see Deatrice Cage, MD or Bernardino Bring, PA-C  We recommend signing up for the patient portal called MyChart.  Sign up information is provided on this After Visit Summary.  MyChart is used to connect with patients for Virtual Visits (Telemedicine).  Patients are able to view lab/test results, encounter notes, upcoming appointments, etc.  Non-urgent messages can be sent to your provider as well.   To learn more about what you can do with MyChart, go to [email or web address].

## 2023-12-20 DIAGNOSIS — Z4789 Encounter for other orthopedic aftercare: Secondary | ICD-10-CM | POA: Diagnosis not present

## 2023-12-20 DIAGNOSIS — M545 Low back pain, unspecified: Secondary | ICD-10-CM | POA: Diagnosis not present

## 2023-12-20 DIAGNOSIS — M5416 Radiculopathy, lumbar region: Secondary | ICD-10-CM | POA: Diagnosis not present

## 2023-12-25 DIAGNOSIS — M545 Low back pain, unspecified: Secondary | ICD-10-CM | POA: Diagnosis not present

## 2023-12-25 DIAGNOSIS — Z4789 Encounter for other orthopedic aftercare: Secondary | ICD-10-CM | POA: Diagnosis not present

## 2023-12-25 DIAGNOSIS — M5416 Radiculopathy, lumbar region: Secondary | ICD-10-CM | POA: Diagnosis not present

## 2023-12-27 DIAGNOSIS — Z4789 Encounter for other orthopedic aftercare: Secondary | ICD-10-CM | POA: Diagnosis not present

## 2023-12-27 DIAGNOSIS — M545 Low back pain, unspecified: Secondary | ICD-10-CM | POA: Diagnosis not present

## 2023-12-27 DIAGNOSIS — M5416 Radiculopathy, lumbar region: Secondary | ICD-10-CM | POA: Diagnosis not present

## 2024-01-01 DIAGNOSIS — M545 Low back pain, unspecified: Secondary | ICD-10-CM | POA: Diagnosis not present

## 2024-01-01 DIAGNOSIS — Z4789 Encounter for other orthopedic aftercare: Secondary | ICD-10-CM | POA: Diagnosis not present

## 2024-01-01 DIAGNOSIS — M5416 Radiculopathy, lumbar region: Secondary | ICD-10-CM | POA: Diagnosis not present

## 2024-01-03 DIAGNOSIS — Z4789 Encounter for other orthopedic aftercare: Secondary | ICD-10-CM | POA: Diagnosis not present

## 2024-01-03 DIAGNOSIS — M5416 Radiculopathy, lumbar region: Secondary | ICD-10-CM | POA: Diagnosis not present

## 2024-01-03 DIAGNOSIS — M545 Low back pain, unspecified: Secondary | ICD-10-CM | POA: Diagnosis not present

## 2024-01-08 DIAGNOSIS — M545 Low back pain, unspecified: Secondary | ICD-10-CM | POA: Diagnosis not present

## 2024-01-08 DIAGNOSIS — Z4789 Encounter for other orthopedic aftercare: Secondary | ICD-10-CM | POA: Diagnosis not present

## 2024-01-08 DIAGNOSIS — M5416 Radiculopathy, lumbar region: Secondary | ICD-10-CM | POA: Diagnosis not present

## 2024-01-10 DIAGNOSIS — Z4789 Encounter for other orthopedic aftercare: Secondary | ICD-10-CM | POA: Diagnosis not present

## 2024-01-10 DIAGNOSIS — M5416 Radiculopathy, lumbar region: Secondary | ICD-10-CM | POA: Diagnosis not present

## 2024-01-10 DIAGNOSIS — M545 Low back pain, unspecified: Secondary | ICD-10-CM | POA: Diagnosis not present

## 2024-01-16 ENCOUNTER — Ambulatory Visit
Admission: RE | Admit: 2024-01-16 | Discharge: 2024-01-16 | Disposition: A | Discharge status: Home / Self Care | Source: Ambulatory Visit | Attending: Internal Medicine | Admitting: Internal Medicine

## 2024-01-16 DIAGNOSIS — J849 Interstitial pulmonary disease, unspecified: Secondary | ICD-10-CM | POA: Insufficient documentation

## 2024-01-16 DIAGNOSIS — R918 Other nonspecific abnormal finding of lung field: Secondary | ICD-10-CM | POA: Diagnosis not present

## 2024-01-16 DIAGNOSIS — K224 Dyskinesia of esophagus: Secondary | ICD-10-CM | POA: Diagnosis not present

## 2024-01-18 ENCOUNTER — Ambulatory Visit
Admission: RE | Admit: 2024-01-18 | Discharge: 2024-01-18 | Disposition: A | Discharge status: Home / Self Care | Source: Ambulatory Visit | Attending: Physician Assistant | Admitting: Physician Assistant

## 2024-01-18 DIAGNOSIS — I25118 Atherosclerotic heart disease of native coronary artery with other forms of angina pectoris: Secondary | ICD-10-CM | POA: Insufficient documentation

## 2024-01-18 DIAGNOSIS — Z951 Presence of aortocoronary bypass graft: Secondary | ICD-10-CM | POA: Insufficient documentation

## 2024-01-18 LAB — NM PET CT CARDIAC PERFUSION MULTI W/ABSOLUTE BLOODFLOW
LV dias vol: 92 mL (ref 62–150)
LV sys vol: 33 mL (ref 4.2–5.8)
Nuc Rest EF: 60 %
Nuc Stress EF: 64 %
Peak HR: 117 {beats}/min
Rest HR: 106 {beats}/min
Rest Nuclear Isotope Dose: 23.8 mCi
SRS: 0
SSS: 14
ST Depression (mm): 0 mm
Stress Nuclear Isotope Dose: 23.9 mCi
TID: 1.18

## 2024-01-18 MED ORDER — REGADENOSON 0.4 MG/5ML IV SOLN
0.4000 mg | Freq: Once | INTRAVENOUS | Status: AC
  Administered 2024-01-18: 0.4 mg via INTRAVENOUS
  Filled 2024-01-18: qty 5

## 2024-01-18 MED ORDER — REGADENOSON 0.4 MG/5ML IV SOLN
INTRAVENOUS | Status: AC
  Filled 2024-01-18: qty 5

## 2024-01-18 MED ORDER — RUBIDIUM RB82 GENERATOR (RUBYFILL)
25.0000 | PACK | Freq: Once | INTRAVENOUS | Status: AC
  Administered 2024-01-18: 23.88 via INTRAVENOUS

## 2024-01-18 MED ORDER — RUBIDIUM RB82 GENERATOR (RUBYFILL)
25.0000 | PACK | Freq: Once | INTRAVENOUS | Status: AC
  Administered 2024-01-18: 23.78 via INTRAVENOUS

## 2024-01-18 NOTE — Progress Notes (Signed)
 Patient presents for a cardiac PET stress test and tolerated procedure without incident. Patient maintained acceptable vital signs throughout the test and was offered caffeine  after test.  Patient ambulated out of department with a steady gait.

## 2024-01-23 ENCOUNTER — Ambulatory Visit: Payer: Self-pay | Admitting: Physician Assistant

## 2024-01-23 DIAGNOSIS — K219 Gastro-esophageal reflux disease without esophagitis: Secondary | ICD-10-CM

## 2024-01-23 DIAGNOSIS — I10 Essential (primary) hypertension: Secondary | ICD-10-CM

## 2024-01-24 MED ORDER — METOPROLOL TARTRATE 100 MG PO TABS: 100.0000 mg | ORAL_TABLET | Freq: Two times a day (BID) | ORAL | 3 refills | Status: AC

## 2024-01-24 MED ORDER — PANTOPRAZOLE SODIUM 40 MG PO TBEC: 40.0000 mg | DELAYED_RELEASE_TABLET | Freq: Every day | ORAL | 3 refills | Status: AC

## 2024-02-01 ENCOUNTER — Ambulatory Visit: Admitting: Pulmonary Disease

## 2024-02-01 ENCOUNTER — Encounter: Payer: Self-pay | Admitting: Pulmonary Disease

## 2024-02-01 VITALS — BP 120/68 | HR 91 | Temp 97.1°F | Ht 65.0 in | Wt 206.2 lb

## 2024-02-01 DIAGNOSIS — J4540 Moderate persistent asthma, uncomplicated: Secondary | ICD-10-CM | POA: Diagnosis not present

## 2024-02-01 DIAGNOSIS — J849 Interstitial pulmonary disease, unspecified: Secondary | ICD-10-CM

## 2024-02-01 LAB — PULMONARY FUNCTION TEST
DL/VA % pred: 116 %
DL/VA: 4.86 ml/min/mmHg/L
DLCO unc % pred: 101 %
DLCO unc: 22.67 ml/min/mmHg
FEF 25-75 Post: 2.64 L/s
FEF 25-75 Pre: 2.25 L/s
FEF2575-%Change-Post: 17 %
FEF2575-%Pred-Post: 121 %
FEF2575-%Pred-Pre: 103 %
FEV1-%Change-Post: 5 %
FEV1-%Pred-Post: 83 %
FEV1-%Pred-Pre: 78 %
FEV1-Post: 2.28 L
FEV1-Pre: 2.16 L
FEV1FVC-%Change-Post: 2 %
FEV1FVC-%Pred-Pre: 107 %
FEV6-%Change-Post: 3 %
FEV6-%Pred-Post: 79 %
FEV6-%Pred-Pre: 76 %
FEV6-Post: 2.79 L
FEV6-Pre: 2.69 L
FEV6FVC-%Change-Post: 0 %
FEV6FVC-%Pred-Post: 106 %
FEV6FVC-%Pred-Pre: 106 %
FVC-%Change-Post: 3 %
FVC-%Pred-Post: 74 %
FVC-%Pred-Pre: 72 %
FVC-Post: 2.79 L
FVC-Pre: 2.69 L
Post FEV1/FVC ratio: 82 %
Post FEV6/FVC ratio: 100 %
Pre FEV1/FVC ratio: 80 %
Pre FEV6/FVC Ratio: 100 %
RV % pred: 79 %
RV: 1.67 L
TLC % pred: 76 %
TLC: 4.58 L

## 2024-02-01 MED ORDER — AEROCHAMBER MV MISC: 0 refills | Status: AC

## 2024-02-01 NOTE — Patient Instructions (Signed)
 Full PFT completed today ? ?

## 2024-02-01 NOTE — Progress Notes (Signed)
 Synopsis: Referred in by Gretel App, NP   Subjective:   PATIENT ID: James Yates GENDER: male DOB: [birth year], MRN: [id number]  No chief complaint on file.   HPI James Yates is a 68 year old male patient with a past medical history of hypertension, CAD status post CABG in February 2015, bicuspid aortic valve status post TAVR in October 2019 presenting for evaluation of interstitial lung disease.  He reports shortness of breath for about 5 years that has been progressively worsening.  Describes it as sometimes he has to take 2-3 deep breaths on that he feels that his lungs are filling with air.  It is associated with a cough with yellowish sputum production.  Also describes dyspnea on exertion that is worsening.  Also described his specific chest pain  He does report joint stiffness mostly in his wrists.  He does have lumbar radiculopathy status post surgery recently.  He denies any skin rash denies any difficulty swallowing denies any Raynaud's.   08/16/2023 CT chest w contrast -diffuse groundglass appearance in both lungs without any specific distribution pattern. No sign of mediastinal LAN. This is stable compared to 2024.   04/18/2022 Echocardiogram Normal EF 55 to 60%. LVEF normal function. RV syst fct is low normal but otherwise no signs of PH.   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 Has 1 dog at home. He is originally from [place] and denies any exposure to birds. Last visited [place] a year ago.    OV 02/01/2024 - James Yates is here to follow up on his CT chest and PFTs. His High res CT does not show any signs of interstitial lung disease. His PFTs are suggestive of asthma. He is on Symbicort  160 but is taking 1 puff in the morning and 1 puff in the evening. I have advised to increase that to 2 puffs twice a day and will send an aerochamber to use with the inhaler.   ROS All systems were reviewed  and are negative except for the above.  Objective:   There were no vitals filed for this visit.    on RA BMI Readings from Last 3 Encounters:  12/19/23 33.65 kg/m  11/27/23 33.61 kg/m  11/08/23 33.18 kg/m   Wt Readings from Last 3 Encounters:  12/19/23 202 lb 3.2 oz (91.7 kg)  11/27/23 202 lb (91.6 kg)  11/08/23 199 lb 6.4 oz (90.4 kg)    Physical Exam GEN: NAD, Healthy Appearing HEENT: Supple Neck, Reactive Pupils, EOMI  CVS: Normal S1, Normal S2, RRR, Systolic murmur heard best at the right sternal border Lungs: Clear bilateral air entry.  Abdomen: Soft, non tender, non distended, + BS  Extremities: Warm and well perfused, No edema  Skin: No suspicious lesions appreciated  Psych: Normal Affect  Ancillary Information   CBC    Component Value Date/Time   WBC 8.6 08/08/2023 1437   RBC 5.42 08/08/2023 1437   HGB 16.1 08/08/2023 1437   HGB 15.6 02/14/2023 1206   HCT 47.7 08/08/2023 1437   HCT 48.7 02/14/2023 1206   PLT 198.0 08/08/2023 1437   PLT 177 02/14/2023 1206   MCV 88.0 08/08/2023 1437   MCV 89 02/14/2023 1206   MCV 87 07/01/2013 1641   MCH 28.5 02/14/2023 1206   MCH 28.4 01/24/2023 0010   MCHC 33.7 08/08/2023 1437   RDW 13.2 08/08/2023 1437   RDW 13.4 02/14/2023 1206  RDW 13.3 07/01/2013 1641   LYMPHSABS 1.6 08/08/2023 1437   LYMPHSABS 0.9 03/07/2019 1033   MONOABS 0.7 08/08/2023 1437   EOSABS 0.2 08/08/2023 1437   EOSABS 0.1 03/07/2019 1033   BASOSABS 0.0 08/08/2023 1437   BASOSABS 0.0 03/07/2019 1033   Labs and imaging were reviewed.     Latest Ref Rng & Units 02/27/2018    1:07 PM 07/03/2013    2:50 PM  PFT Results  FVC-Pre L 3.25  2.92   FVC-Predicted Pre % 80  69   FVC-Post L 3.05  3.12   FVC-Predicted Post % 75  74   Pre FEV1/FVC % % 78  76   Post FEV1/FCV % % 85  77   FEV1-Pre L 2.53  2.23   FEV1-Predicted Pre % 82  69   FEV1-Post L 2.58  2.41   DLCO uncorrected ml/min/mmHg 26.10  35.37   DLCO UNC% % 96  131   DLCO corrected  ml/min/mmHg  33.54   DLCO COR %Predicted %  124   DLVA Predicted % 128  167   TLC L  4.73   TLC % Predicted %  76   RV % Predicted %  73      Assessment & Plan:  James Yates is a 68 year old male patient with a past medical history of hypertension, CAD status post CABG in February 2015, bicuspid aortic valve status post TAVR in October 2019 presenting for evaluation of interstitial lung disease.  #Moderate persistent asthma  PFTs with some response to bronchodilators.  EOS 200  High res CT negative for ILD. This was likely representing mosaicism due to air trapping.   []  C/w Symbicort  2 puffs bid. Advised mouth rinsing after each use and provided chamber to be used with the inhaler.  []  C/w Albuterol  as needed.   RTC 6 months.   I spent 30 minutes caring for this patient today, including preparing to see the patient, obtaining a medical history , reviewing a separately obtained history, performing a medically appropriate examination and/or evaluation, counseling and educating the patient/family/caregiver, ordering medications, tests, or procedures, documenting clinical information in the electronic health record, and independently interpreting results (not separately reported/billed) and communicating results to the patient/family/caregiver  Darrin Barn, MD [facility] 02/01/2024 2:36 PM

## 2024-02-01 NOTE — Progress Notes (Signed)
 Full PFT completed today ? ?

## 2024-02-08 ENCOUNTER — Ambulatory Visit: Admitting: Nurse Practitioner

## 2024-02-12 ENCOUNTER — Telehealth: Payer: Self-pay

## 2024-02-12 NOTE — Telephone Encounter (Signed)
 Can an interpreter be requested for pts appt 02-13-24 @ 11:20 am ?

## 2024-02-13 ENCOUNTER — Ambulatory Visit: Admitting: Nurse Practitioner

## 2024-02-13 VITALS — BP 124/84 | HR 72 | Temp 98.5°F | Ht 65.0 in | Wt 204.6 lb

## 2024-02-13 DIAGNOSIS — J4540 Moderate persistent asthma, uncomplicated: Secondary | ICD-10-CM | POA: Diagnosis not present

## 2024-02-13 DIAGNOSIS — Z23 Encounter for immunization: Secondary | ICD-10-CM | POA: Diagnosis not present

## 2024-02-13 DIAGNOSIS — E119 Type 2 diabetes mellitus without complications: Secondary | ICD-10-CM | POA: Diagnosis not present

## 2024-02-13 DIAGNOSIS — Z7984 Long term (current) use of oral hypoglycemic drugs: Secondary | ICD-10-CM | POA: Diagnosis not present

## 2024-02-13 DIAGNOSIS — E7800 Pure hypercholesterolemia, unspecified: Secondary | ICD-10-CM | POA: Diagnosis not present

## 2024-02-13 DIAGNOSIS — I10 Essential (primary) hypertension: Secondary | ICD-10-CM | POA: Diagnosis not present

## 2024-02-13 LAB — LIPID PANEL
Cholesterol: 212 mg/dL — ABNORMAL HIGH (ref 0–200)
HDL: 41.7 mg/dL (ref >39.00)
LDL Cholesterol: 111 mg/dL — ABNORMAL HIGH (ref 0–99)
NonHDL: 170.4
Total CHOL/HDL Ratio: 5
Triglycerides: 299 mg/dL — ABNORMAL HIGH (ref 0.0–149.0)
VLDL: 59.8 mg/dL — ABNORMAL HIGH (ref 0.0–40.0)

## 2024-02-13 LAB — COMPREHENSIVE METABOLIC PANEL WITH GFR
ALT: 24 U/L (ref 0–53)
AST: 20 U/L (ref 0–37)
Albumin: 4.6 g/dL (ref 3.5–5.2)
Alkaline Phosphatase: 95 U/L (ref 39–117)
BUN: 15 mg/dL (ref 6–23)
CO2: 29 meq/L (ref 19–32)
Calcium: 9 mg/dL (ref 8.4–10.5)
Chloride: 104 meq/L (ref 96–112)
Creatinine, Ser: 0.95 mg/dL (ref 0.40–1.50)
GFR: 82.71 mL/min (ref >60.00)
Glucose, Bld: 129 mg/dL — ABNORMAL HIGH (ref 70–99)
Potassium: 3.5 meq/L (ref 3.5–5.1)
Sodium: 135 meq/L (ref 135–145)
Total Bilirubin: 0.8 mg/dL (ref 0.2–1.2)
Total Protein: 7.3 g/dL (ref 6.0–8.3)

## 2024-02-13 LAB — HEMOGLOBIN A1C: Hgb A1c MFr Bld: 8.7 % — ABNORMAL HIGH (ref 4.6–6.5)

## 2024-02-13 LAB — MICROALBUMIN / CREATININE URINE RATIO
Creatinine,U: 193.6 mg/dL
Microalb Creat Ratio: 32.1 mg/g — ABNORMAL HIGH (ref 0.0–30.0)
Microalb, Ur: 6.2 mg/dL — ABNORMAL HIGH (ref 0.0–1.9)

## 2024-02-13 MED ORDER — ALBUTEROL SULFATE HFA 108 (90 BASE) MCG/ACT IN AERS: 1.0000 | INHALATION_SPRAY | Freq: Four times a day (QID) | RESPIRATORY_TRACT | 0 refills | Status: DC | PRN

## 2024-02-13 MED ORDER — ALBUTEROL SULFATE HFA 108 (90 BASE) MCG/ACT IN AERS: 1.0000 | INHALATION_SPRAY | Freq: Four times a day (QID) | RESPIRATORY_TRACT | 2 refills | Status: AC | PRN

## 2024-02-13 NOTE — Patient Instructions (Signed)
Influenza (Flu) Vaccine (Inactivated or Recombinant): What You Need to Know Many vaccine information statements are available in Spanish and other languages. See PromoAge.com.br. 1. Why get vaccinated? Influenza vaccine can prevent influenza (flu). Flu is a contagious disease that spreads around the [place] every year, usually between October and May. Anyone can get the flu, but it is more dangerous for some people. Infants and young children, people 58 years and older, pregnant people, and people with certain health conditions or a weakened immune system are at greatest risk of flu complications. Pneumonia, bronchitis, sinus infections, and ear infections are examples of flu-related complications. If you have a medical condition, such as heart disease, cancer, or diabetes, flu can make it worse. Flu can cause fever and chills, sore throat, muscle aches, fatigue, cough, headache, and runny or stuffy nose. Some people may have vomiting and diarrhea, though this is more common in children than adults. In an average year, thousands of people in the [place] die from flu, and many more are hospitalized. Flu vaccine prevents millions of illnesses and flu-related visits to the doctor each year. 2. Influenza vaccines CDC recommends everyone 6 months and older get vaccinated every flu season. Children 6 months through 51 years of age may need 2 doses during a single flu season. Everyone else needs only 1 dose each flu season. It takes about 2 weeks for protection to develop after vaccination. There are many flu viruses, and they are always changing. Each year a new flu vaccine is made to protect against the influenza viruses believed to be likely to cause disease in the upcoming flu season. Even when the vaccine doesn't exactly match these viruses, it may still provide some protection. Influenza vaccine does not cause flu. Influenza vaccine may be given at the same time as other vaccines. 3.  Talk with your health care provider Tell your vaccination provider if the person getting the vaccine: Has had an allergic reaction after a previous dose of influenza vaccine, or has any severe, life-threatening allergies Has ever had Guillain-Barr Syndrome (also called "GBS") In some cases, your health care provider may decide to postpone influenza vaccination until a future visit. Influenza vaccine can be administered at any time during pregnancy. People who are or will be pregnant during influenza season should receive inactivated influenza vaccine. People with minor illnesses, such as a cold, may be vaccinated. People who are moderately or severely ill should usually wait until they recover before getting influenza vaccine. Your health care provider can give you more information. 4. Risks of a vaccine reaction Soreness, redness, and swelling where the shot is given, fever, muscle aches, and headache can happen after influenza vaccination. There may be a very small increased risk of Guillain-Barr Syndrome (GBS) after inactivated influenza vaccine (the flu shot). Young children who get the flu shot along with pneumococcal vaccine (PCV13) and/or DTaP vaccine at the same time might be slightly more likely to have a seizure caused by fever. Tell your health care provider if a child who is getting flu vaccine has ever had a seizure. People sometimes faint after medical procedures, including vaccination. Tell your provider if you feel dizzy or have vision changes or ringing in the ears. As with any medicine, there is a very remote chance of a vaccine causing a severe allergic reaction, other serious injury, or death. 5. What if there is a serious problem? An allergic reaction could occur after the vaccinated person leaves the clinic. If you see signs of  a severe allergic reaction (hives, swelling of the face and throat, difficulty breathing, a fast heartbeat, dizziness, or weakness), call 9-1-1 and get  the person to the nearest hospital. For other signs that concern you, call your health care provider. Adverse reactions should be reported to the Vaccine Adverse Event Reporting System (VAERS). Your health care provider will usually file this report, or you can do it yourself. Visit the VAERS website at www.vaers.LAgents.no or call (774)365-3656. VAERS is only for reporting reactions, and VAERS staff members do not give medical advice. 6. The National Vaccine Injury Compensation Program The [employer] Vaccine Injury Compensation Program (VICP) is a federal program that was created to compensate people who may have been injured by certain vaccines. Claims regarding alleged injury or death due to vaccination have a time limit for filing, which may be as short as two years. Visit the VICP website at SpiritualWord.at or call 3187036210 to learn about the program and about filing a claim. 7. How can I learn more? Ask your health care provider. Call your local or state health department. Visit the website of the Food and Drug Administration (FDA) for vaccine package inserts and additional information at FinderList.no. Contact the Centers for Disease Control and Prevention (CDC): Call 787-694-5930 (1-800-CDC-INFO) or Visit CDC's website at BiotechRoom.com.cy. Source: CDC Vaccine Information Statement Inactivated Influenza Vaccine (01/03/2020) This same material is available at FootballExhibition.com.br for no charge. This information is not intended to replace advice given to you by your health care provider. Make sure you discuss any questions you have with your health care provider. Document Revised: 08/31/2022 Document Reviewed: 06/06/2022 Elsevier Patient Education  2024 [employer].

## 2024-02-13 NOTE — Progress Notes (Signed)
 Leron Glance, NP-C Phone: [phone]  James Yates is a 68 y.o. male who presents today for follow up.   Discussed the use of AI scribe software for clinical note transcription with the patient, who gave verbal consent to proceed.  History of Present Illness   James Yates is a 68 year old male with asthma and diabetes who presents for a three-month follow-up.  He experiences intermittent shortness of breath and cough, which have improved but still occur occasionally. He uses Symbicort , two puffs in the morning and two puffs at night, which has been effective. He has a lot of coughing in the morning but no longer coughs up sputum. He sometimes feels short of breath but describes it as manageable. The patient reports a recent pulmonologist visit and recalls that asthma was discussed, but is unsure of the details.  He is currently taking metformin  for diabetes but does not check his blood sugar at home. No episodes of hypoglycemia have been reported. His last A1c was reported as good.  He monitors his blood pressure every night, with readings typically around 135 to 140 mmHg. He sometimes experiences chest pain, which he associates with high blood pressure, but denies dizziness or leg swelling. He has a history of heart surgeries and is on three blood pressure medications: losartan , metoprolol , and amlodipine . He also takes rosuvastatin  for cholesterol management.  He has not eaten today and plans to have blood work done. He inquires about receiving a flu vaccine during the visit.      Social History   Tobacco Use  Smoking Status Never   Passive exposure: Never  Smokeless Tobacco Never    Current Outpatient Medications on File Prior to Visit  Medication Sig Dispense Refill   amLODipine  (NORVASC ) 10 MG tablet Take 1 tablet (10 mg total) by mouth daily. 90 tablet 3   aspirin  EC 81 MG tablet Take 1 tablet (81 mg total) by mouth daily. Resume 07/01/22     budesonide -formoterol   (SYMBICORT ) 160-4.5 MCG/ACT inhaler Inhale 2 puffs into the lungs 2 (two) times daily. (Patient not taking: Reported on 02/22/2024) 1 each 12   cyclobenzaprine  (FLEXERIL ) 5 MG tablet Take 1 tablet (5 mg total) by mouth 3 (three) times daily as needed for muscle spasms. 60 tablet 0   gabapentin  (NEURONTIN ) 300 MG capsule Take 1 capsule (300 mg total) by mouth 2 (two) times daily. 180 capsule 2   losartan  (COZAAR ) 100 MG tablet Take 1 tablet (100 mg total) by mouth daily. 90 tablet 3   metFORMIN  (GLUCOPHAGE -XR) 500 MG 24 hr tablet Take 1 tablet (500 mg total) by mouth daily with breakfast. 90 tablet 3   metoprolol  tartrate (LOPRESSOR ) 100 MG tablet Take 1 tablet (100 mg total) by mouth 2 (two) times daily. 180 tablet 3   pantoprazole  (PROTONIX ) 40 MG tablet Take 1 tablet (40 mg total) by mouth daily. 90 tablet 3   rosuvastatin  (CRESTOR ) 40 MG tablet Take 1 tablet (40 mg total) by mouth daily. 90 tablet 3   sertraline  (ZOLOFT ) 100 MG tablet Take 1 tablet (100 mg total) by mouth daily. 90 tablet 3   Spacer/Aero-Holding Chambers (AEROCHAMBER MV) inhaler Use as instructed 1 each 0   No current facility-administered medications on file prior to visit.     ROS see history of present illness  Objective  Physical Exam Vitals:   02/13/24 1145  BP: 124/84  Pulse: 72  Temp: 98.5 F (36.9 C)  SpO2: 98%    BP Readings from Last  3 Encounters:  02/22/24 112/72  02/13/24 124/84  02/01/24 120/68   Wt Readings from Last 3 Encounters:  02/22/24 203 lb (92.1 kg)  02/13/24 204 lb 9.6 oz (92.8 kg)  02/01/24 206 lb 3.2 oz (93.5 kg)    Physical Exam Constitutional:      General: He is not in acute distress.    Appearance: Normal appearance.  HENT:     Head: Normocephalic.  Cardiovascular:     Rate and Rhythm: Normal rate and regular rhythm.     Heart sounds: Normal heart sounds.  Pulmonary:     Effort: Pulmonary effort is normal.     Breath sounds: Normal breath sounds.  Skin:    General:  Skin is warm and dry.  Neurological:     General: No focal deficit present.     Mental Status: He is alert.  Psychiatric:        Mood and Affect: Mood normal.        Behavior: Behavior normal.      Assessment/Plan: Please see individual problem list.  Type 2 diabetes mellitus without complication, without long-term current use of insulin  (HCC) Assessment & Plan: Type 2 diabetes is managed with metformin  500 mg once daily, and the last A1c was well controlled at 6.3. Continue metformin  and check A1c today. Encourage healthy diet and regular exercise.   Orders: -     Microalbumin / creatinine urine ratio -     Hemoglobin A1c  Moderate persistent asthma, unspecified whether complicated Assessment & Plan: Moderate persistent asthma is confirmed by pulmonary function tests. He uses Symbicort  with some improvement. Prescribe Albuterol  for significant shortness of breath or wheezing. Continue Symbicort , two puffs morning and night. Follow up with Pulm as scheduled.   Orders: -     Albuterol  Sulfate HFA; Inhale 1-2 puffs into the lungs every 6 (six) hours as needed for wheezing or shortness of breath.  Dispense: 8.5 g; Refill: 2  HTN, goal below 140/90 Assessment & Plan: Hypertension is managed with losartan , metoprolol , and amlodipine . Blood pressure stable, at goal in office. He experiences occasional chest pain with elevated BP, but cardiology evaluation is normal. Continue losartan , metoprolol , and amlodipine . Monitor blood pressure at home daily.  Orders: -     Comprehensive metabolic panel with GFR  Pure hypercholesterolemia Assessment & Plan: Hypercholesterolemia is managed with rosuvastatin . Continue rosuvastatin . Check lipid panel.   Orders: -     Lipid panel  Need for influenza vaccination -     Flu vaccine HIGH DOSE PF(Fluzone Trivalent)     Return in about 6 months (around 08/12/2024) for Follow up.   Leron Glance, NP-C [facility]

## 2024-02-14 ENCOUNTER — Other Ambulatory Visit (HOSPITAL_COMMUNITY)

## 2024-02-21 ENCOUNTER — Ambulatory Visit: Payer: Self-pay | Admitting: Nurse Practitioner

## 2024-02-21 NOTE — Progress Notes (Unsigned)
 Cardiology Office Note:    Date:  02/22/2024   ID:  James Yates, DOB 09/18/1955, MRN [id number]  PCP:  Gretel App, NP   [facility] Providers Cardiologist:  Deatrice Cage, MD     Referring MD: Gretel App, NP   Chief complaint: 22-month follow-up  History of Present Illness:    Jackie Littlejohn is a 68 y.o. male with a hx of CAD s/p CABG X4, (LIMA-LAD, SVG-PDA, seqSVG-OM1/OM 3) in 06/2013 following a NSTEMI with chronic angina, bicuspid aortic valve with severe stenosis s/p TAVR in 02/2018, paroxysmal SVT, ILD, HTN, HLD, and RBBB who presents for 34-month follow-up.  LHC in 2017 revealed the seqSVG-OM1/OM3 100% occluded.  Reviewed for CTO, not a candidate.  Underwent a TAVR 2019. He underwent Zio in 2021 patch monitoring which showed a predominant rhythm of sinus with an average heart rate of 89 bpm, IVCD was noted, 10 episodes of SVT with the longest episode lasting 16 beats with an average heart rate of 152 bpm, rare PACs and PVCs.  Some patient triggered events correlated with PVCs and sinus tachycardia.   Lost health insurance in late 2022, went off antianginal medications and began having chest discomfort.  Was able to get back on insurance and restart medication in early 2023.  Later that year continued to have exertional chest discomfort.  Stress test 02/10/2022 low risk, no significant ischemia, small region of fixed defect in the apical, distal anterior apical, and inferior apical region and an EF of 46%. Echo on 04/18/2022 demonstrated an EF of 55 to 60%, no regional wall motion abnormalities, indeterminate LV diastolic function parameters, low normal RV systolic function with normal ventricular cavity size, normal structure and function of aortic valve prosthesis, and an estimated right atrial pressure of 3 mmHg.  Could not afford ranolazine , Imdur  worsened headache.  ED visit 01/24/2023 for multiple complaints including chest pressure relieved with nitroglycerin ,  headache, dizziness, imbalance. High-sensitivity troponin 29 with a delta troponin 26.  Negative CXR, negative CTA head/neck, CTA chest negative for PE, MRI of brain negative.  LHC 02/20/2023 showed no significant change in coronary anatomy since in 2019, medical therapy recommended.  OV 05/2023 patient reported improved symptoms and BP control.  Most recently seen on 12/19/2023, stable from a cardiac standpoint at that time.  Continued to have exertional chest pressure/SOB that resolved with rest.  Was being evaluated by pulmonology for ILD.  Cardiac PET ordered to rule out significant ischemia/small vessel disease.  NM PET 01/18/2024: LV perfusion abnormal.  Reversible defect present in the apical-base lateral locations, RWMA present in that area, consistent with ischemia in the LCfx territory.  LV function normal, rest EF 60%, stress EF 64%, mild TID 1.18.  Findings correlate with known CTO of LCfx/seqSVG, intermediate risk study.  Presents to the clinic today with continued complaints of chest pain mostly occurring with exertion, 8/10, substernal, stabbing in nature, mostly unchanged since prior OV with James 2 months ago.  Associated symptoms include occasional shortness of breath, dizziness, palpitations, nausea.  Denies dark/tarry/bloody stool, unintentional weight loss, lower extremity edema.  Patient is retired, able to perform ADLs, symptoms exacerbated with moderate-high activity.  Past Medical History:  Diagnosis Date   Aortic atherosclerosis    Aortic stenosis due to bicuspid aortic valve    a.) TTE 01/26/15: mild-mod AS (MPG 19; VTI 1.18); b.) TTE 07/20/15: mild AS (MPG 13; VTI 1.06); c.) LHC 10/29/15: mod AS (peak-peak grad 22); d.) TTE 08/23/16: mod AS (MPG 21; VTI 0.93); e.)  TTE 10/10/17: sev AS (MPG 38; VTI 0.6); f.) R/LHC 11/27/17: mod-sev AS (MPG 26; VTI 1.2); g.) TTE 02/27/18: mod AS (MPG 30; VTI 0.52); h.) TTE 03/13/18: sev AS (MPG 33; VTI 0.94); i.) s/p TAVR 03/13/2018   Bilateral  carotid artery stenosis    a.) doppler 02/27/2018: 1-39% BICA   Cervical radiculopathy    CHF (congestive heart failure) (HCC)    Coronary artery disease    a.) NSTEMI 07/02/2013 --> 4v CABG 07/05/2013 (LIMA-LAD, SVG-PDA, SVG-OM1, SVG-OM3); b.) MV 10/19/2015: LAD territory isch; c.) LHC 10/29/2015: 30% pRCA, 100% mRCA, 90% o-mLAD, 70% oD1-D1, 30% dLAD, 100% oD2-D2, 100% pLCx, 100% SVG-OM3 -- med mgmt; d.   GERD (gastroesophageal reflux disease)    Glucose intolerance (impaired glucose tolerance)    Headache    chronic, no red flag or atypical signs or symptoms   Hyperlipidemia    Hypertension    Lumbar radiculopathy    Lumbar spondylosis    NSTEMI (non-ST elevated myocardial infarction) (HCC) 07/02/2013   a.) LHC 07/02/2013: 80% pLAD, 90% D1, 95% pLCx, 99% mLCx, 90% OM2, 60% OM3, 40% left AV groove, 50% pRCA, 40% dRCA, 90% RPL1 --> CVTS; b.) s/p 4v CABG 07/05/2013   Obesity    Onychomycosis    RBBB (right bundle branch block)    S/P CABG x 4 07/05/2013   a.) LIMA-LAD, SVG-PDA, SVG-OM1, SVG-OM3   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   a.) 26 mm Edwards Sapien 3 transcatheter heart valve placed via percutaneous right transfemoral approach    Past Surgical History:  Procedure Laterality Date   CARDIAC CATHETERIZATION  06/30/2013   [facility]   CARDIAC CATHETERIZATION N/A 10/29/2015   Procedure: Left Heart Cath and Cors/Grafts Angiography;  Surgeon: Deatrice DELENA Cage, MD;  Location: [facility] INVASIVE CV LAB;  Service: Cardiovascular;  Laterality: N/A;   CORONARY ARTERY BYPASS GRAFT N/A 07/05/2013   Procedure: CORONARY ARTERY BYPASS GRAFTING (CABG);  Surgeon: Maude Fleeta Ochoa, MD;  Location: [facility] OR;  Service: Open Heart Surgery;  Laterality: N/A;   INTRAOPERATIVE TRANSESOPHAGEAL ECHOCARDIOGRAM N/A 07/05/2013   Procedure: INTRAOPERATIVE TRANSESOPHAGEAL ECHOCARDIOGRAM;  Surgeon: Maude Fleeta Ochoa, MD;  Location: [facility] OR;  Service: Open Heart Surgery;  Laterality: N/A;    INTRAOPERATIVE TRANSTHORACIC ECHOCARDIOGRAM N/A 03/13/2018   Procedure: INTRAOPERATIVE TRANSTHORACIC ECHOCARDIOGRAM;  Surgeon: Verlin Lonni BIRCH, MD;  Location: [facility] OR;  Service: Open Heart Surgery;  Laterality: N/A;   LEFT HEART CATH AND CORS/GRAFTS ANGIOGRAPHY N/A 02/20/2023   Procedure: LEFT HEART CATH AND CORS/GRAFTS ANGIOGRAPHY;  Surgeon: Cage Deatrice DELENA, MD;  Location: [facility] INVASIVE CV LAB;  Service: Cardiovascular;  Laterality: N/A;   LUMBAR LAMINECTOMY/DECOMPRESSION MICRODISCECTOMY Right 06/29/2023   Procedure: RIGHT L4-5 HEMILAMINECTOMY AND DISCECTOMY;  Surgeon: Claudene Penne ORN, MD;  Location: [facility] ORS;  Service: Neurosurgery;  Laterality: Right;   RIGHT/LEFT HEART CATH AND CORONARY ANGIOGRAPHY Bilateral 11/27/2017   Procedure: RIGHT/LEFT HEART CATH AND CORONARY ANGIOGRAPHY;  Surgeon: Cage Deatrice DELENA, MD;  Location: [facility] INVASIVE CV LAB;  Service: Cardiovascular;  Laterality: Bilateral;   TRANSCATHETER AORTIC VALVE REPLACEMENT, TRANSFEMORAL N/A 03/13/2018   Procedure: TRANSCATHETER AORTIC VALVE REPLACEMENT, TRANSFEMORAL. Edwards Sapien 3 Transcatheter Heart Valve size 26mm.;  Surgeon: Verlin Lonni BIRCH, MD;  Location: [facility] OR;  Service: Open Heart Surgery;  Laterality: N/A;    Current Medications: Current Meds  Medication Sig   albuterol  (VENTOLIN  HFA) 108 (90 Base) MCG/ACT inhaler Inhale 1-2 puffs into the lungs every 6 (six) hours as needed for wheezing or shortness of breath.   amLODipine  (  NORVASC ) 10 MG tablet Take 1 tablet (10 mg total) by mouth daily.   aspirin  EC 81 MG tablet Take 1 tablet (81 mg total) by mouth daily. Resume 07/01/22   cyclobenzaprine  (FLEXERIL ) 5 MG tablet Take 1 tablet (5 mg total) by mouth 3 (three) times daily as needed for muscle spasms.   Evolocumab  (REPATHA  SURECLICK) 140 MG/ML SOAJ Inject 140 mg into the skin every 14 (fourteen) days.   gabapentin  (NEURONTIN ) 300 MG capsule Take 1 capsule (300 mg total) by mouth 2 (two) times daily.   losartan   (COZAAR ) 100 MG tablet Take 1 tablet (100 mg total) by mouth daily.   metFORMIN  (GLUCOPHAGE -XR) 500 MG 24 hr tablet Take 1 tablet (500 mg total) by mouth daily with breakfast.   metoprolol  tartrate (LOPRESSOR ) 100 MG tablet Take 1 tablet (100 mg total) by mouth 2 (two) times daily.   pantoprazole  (PROTONIX ) 40 MG tablet Take 1 tablet (40 mg total) by mouth daily.   ranolazine  (RANEXA ) 500 MG 12 hr tablet Take 1 tablet (500 mg total) by mouth 2 (two) times daily.   rosuvastatin  (CRESTOR ) 40 MG tablet Take 1 tablet (40 mg total) by mouth daily.   sertraline  (ZOLOFT ) 100 MG tablet Take 1 tablet (100 mg total) by mouth daily.   Spacer/Aero-Holding Chambers (AEROCHAMBER MV) inhaler Use as instructed     Allergies:   Patient has no known allergies.   Social History   Socioeconomic History   Marital status: Married    Spouse name: Aurelia   Number of children: 3   Years of education: Not on file   Highest education level: Not on file  Occupational History   Not on file  Tobacco Use   Smoking status: Never    Passive exposure: Never   Smokeless tobacco: Never  Vaping Use   Vaping status: Never Used  Substance and Sexual Activity   Alcohol use: No   Drug use: No   Sexual activity: Not on file  Other Topics Concern   Not on file  Social History Narrative   Lives with wife at home and son Sherlean    Social Drivers of Corporate investment banker Strain: Not on file  Food Insecurity: Not on file  Transportation Needs: Not on file  Physical Activity: Not on file  Stress: Not on file  Social Connections: Not on file     Family History: The patient's family history includes Heart attack in his father; Heart disease in his father.  ROS:   Please see the history of present illness.     All other systems reviewed and are negative.  EKGs/Labs/Other Studies Reviewed:    The following studies were reviewed today:  EKG Interpretation Date/Time:  Thursday February 22 2024  09:13:28 EDT Ventricular Rate:  78 PR Interval:  166 QRS Duration:  136 QT Interval:  422 QTC Calculation: 481 R Axis:   22  Text Interpretation: Normal sinus rhythm Right bundle branch block Inferior infarct (cited on or before 16-Jun-2023) When compared with ECG of 19-Dec-2023 09:20, No significant change was found Confirmed by Elaine Moloney [phone]) on 02/22/2024 9:31:09 AM    Recent Labs: 08/08/2023: Hemoglobin 16.1; Platelets 198.0; TSH 2.18 10/13/2023: B Natriuretic Peptide 34.8 02/13/2024: ALT 24; BUN 15; Creatinine, Ser 0.95; Potassium 3.5; Sodium 135  Recent Lipid Panel    Component Value Date/Time   CHOL 212 (H) 02/13/2024 1201   CHOL 245 (H) 02/14/2023 1206   CHOL 215 (H) 07/02/2013 0058   TRIG 299.0 (  H) 02/13/2024 1201   TRIG 143 07/02/2013 0058   HDL 41.70 02/13/2024 1201   HDL 41 02/14/2023 1206   HDL 38 (L) 07/02/2013 0058   CHOLHDL 5 02/13/2024 1201   VLDL 59.8 (H) 02/13/2024 1201   VLDL 29 07/02/2013 0058   LDLCALC 111 (H) 02/13/2024 1201   LDLCALC 162 (H) 02/14/2023 1206   LDLCALC 148 (H) 07/02/2013 0058   LDLDIRECT 141.0 08/08/2023 1437   NM PET 01/18/2024:   LV perfusion is abnormal. Defect 1: There is a medium defect with severe reduction in uptake present in the apical to basal lateral location(s) that is reversible. There is abnormal wall motion in the defect area. Consistent with ischemia. The defect is consistent with abnormal perfusion in the LCx territory (known to be chronically occluded with bridging collaterals).   Rest left ventricular function is normal. Rest EF: 60%. Stress left ventricular function is normal. Stress EF: 64%. End diastolic cavity size is normal. End systolic cavity size is normal.  Mild transient ischemic dilation is noted (TID 1.18).   Myocardial blood flow reserve is not reported in this patient due to technical or patient-specific concerns that affect accuracy.   Coronary calcium  assessment not performed due to prior  revascularization.   Findings are consistent with ischemia, consistent with the patient's known CTO of the LCx. The study is intermediate risk ------------------------------  LHC 02/20/2023:   Dist LAD lesion is 30% stenosed.   Ost LAD to Mid LAD lesion is 80% stenosed.   Prox Cx to Mid Cx lesion is 100% stenosed.   Prox RCA lesion is 30% stenosed.   Mid RCA lesion is 100% stenosed.   Prox Graft lesion before Ramus  is 100% stenosed.   Ost 1st Diag to 1st Diag lesion is 70% stenosed.   Ost 2nd Diag to 2nd Diag lesion is 100% stenosed.   Origin to Prox Graft lesion is 100% stenosed.   SVG graft was visualized by angiography and is normal in caliber.   LIMA graft was visualized by angiography and is normal in caliber.   The graft exhibits no disease.   The graft exhibits no disease.   1.  Significant underlying three-vessel coronary artery disease with patent LIMA to LAD and patent SVG to right PDA.  Chronically occluded proximal left circumflex with bridging collaterals and right to left collaterals with known chronically occluded SVG to OM. 2.  No significant gradient across the TAVR prosthesis by pullback. 3.  Left ventricular angiography was not performed.  EF was normal by echo.  High normal LVEDP at 13 mmHg.   Recommendations: No significant change in coronary anatomy since most recent cardiac catheterization.  The left circumflex is well collateralized.  Recommend continuing medical therapy. __________   2D echo 04/18/2022: 1. Left ventricular ejection fraction, by estimation, is 55 to 60%. The  left ventricle has normal function. The left ventricle has no regional  wall motion abnormalities. Left ventricular diastolic parameters are  indeterminate. The average left  ventricular global longitudinal strain is -22.8 %. The global longitudinal  strain is normal.   2. Right ventricular systolic function is low normal. The right  ventricular size is normal.   3. The mitral valve is  normal in structure. No evidence of mitral valve  regurgitation.   4. The aortic valve has been repaired/replaced. Aortic valve  regurgitation is not visualized. Echo findings are consistent with normal  structure and function of the aortic valve prosthesis. Aortic valve mean  gradient measures  11.0 mmHg.   5. The inferior vena cava is normal in size with greater than 50%  respiratory variability, suggesting right atrial pressure of 3 mmHg.   Comparison(s): Echocardiogram done 04/16/20 showed an EF of 50-55%.  __________   Lexiscan  MPI 02/10/2022: Pharmacological myocardial perfusion imaging study with no significant  ischemia Small region fixed defect in the apical, distal anterior apical, inferior apical region, unable to exclude small region prior MI Normal wall motion, EF estimated at 46% No EKG changes concerning for ischemia at peak stress or in recovery. CT attenuation correction images with mild aortic atherosclerosis and moderate coronary calcification, TAVR valve noted Low risk scan   Physical Exam:    VS:  BP 112/72 (BP Location: Left Arm, Patient Position: Sitting, Cuff Size: Normal)   Pulse 78   Ht 5' 5 (1.651 m)   Wt 203 lb (92.1 kg)   SpO2 95%   BMI 33.78 kg/m     Wt Readings from Last 3 Encounters:  02/22/24 203 lb (92.1 kg)  02/13/24 204 lb 9.6 oz (92.8 kg)  02/01/24 206 lb 3.2 oz (93.5 kg)     GEN:  Well nourished, well developed in no acute distress HEENT: Normal NECK: No carotid bruits CARDIAC: RRR, mild systolic murmur over right sternal border/2nd IC space (2/6), no rubs, gallops RESPIRATORY:  Clear to auscultation without rales, wheezing or rhonchi  MUSCULOSKELETAL:  No edema; No deformity  SKIN: Warm and dry NEUROLOGIC:  Alert and oriented x 3 PSYCHIATRIC:  Normal affect   ASSESSMENT:    1. Coronary artery disease of native artery of native heart with stable angina pectoris   2. S/P CABG x 4   3. Severe aortic stenosis   4. S/P TAVR  (transcatheter aortic valve replacement)   5. Primary hypertension   6. Mixed hyperlipidemia   7. Language barrier    PLAN:    In order of problems listed above:  CAD s/p CABG X4 with stable angina LHC 01/2023 unchanged from previous cath, significant disease in native vessels, occluded seqSVG-OM1/OM 3 NM PET 12/2023: Ischemia in the LCfx territory, consistent with known CTO EKG: NSR, 78 bpm, RBBB, no change from prior study Reports continued CP with associated SOB, palp, nausea, occasional dizziness Unable to afford Ranexa  in the past  Unable to tolerate side effects of Imdur  (headache) Continue maximum daily dose of Lopressor  100 mg twice daily Continue maximum daily dose of amlodipine  10 mg daily Continue aspirin  EC 81 mg daily Continue losartan  100 mg daily Continue rosuvastatin  40 mg daily Will schedule appointment with Pharm.D. for prior auth for Ranexa  and Repatha  Could consider zio monitoring to evaluate ectopy burden given symptoms of palpitations at f/u visit if no improvement in symptoms on Ranexa .  Severe AS s/p TAVR Echo 04/18/2022: Aortic valve regurgitation not visualized, mean gradient 11.0 mmHg.  Peak gradient 20.9 mmHg, valve area by VTI measures 1.37 cm, 26 mm SAPIEN prosthetic stented TAVR valve present.  Findings consistent with normal structure and function of the AV valve prosthesis. Mild systolic murmur over right sternal border/2nd IC space (2/6) Continue aspirin  EC 81 mg SBE prophylaxis indicated for all dental procedures. Could consider repeat echo at following visit to re-evaluate TAVR status  HTN  BP remains well-controlled in office  Continue amlodipine , losartan , Lopressor  as described above  Low-sodium diet recommended  HLD, LDL goal <55  Lipid panel 0/83/7974: Cholesterol 212, LDL 111, triglycerides 299, HDL 41  Continue Crestor  40 mg daily  No longer on Zetia   Will prescribe Repatha  140 mg subcutaneous inj once every 2 weeks  Will have PharmD  address need for prior auth if insurance does not cover at scheduled visit.  Language barrier  Video medical interpreter utilized for today's visit  Follow up in 3 months with MD or APP  Medication Adjustments/Labs and Tests Ordered: Current medicines are reviewed at length with the patient today.  Concerns regarding medicines are outlined above.  Orders Placed This Encounter  Procedures   AMB Referral to Heartcare Pharm-D   EKG 12-Lead   Meds ordered this encounter  Medications   Evolocumab  (REPATHA  SURECLICK) 140 MG/ML SOAJ    Sig: Inject 140 mg into the skin every 14 (fourteen) days.    Dispense:  6 mL    Refill:  3   ranolazine  (RANEXA ) 500 MG 12 hr tablet    Sig: Take 1 tablet (500 mg total) by mouth 2 (two) times daily.    Dispense:  60 tablet    Refill:  11    Patient Instructions  Medication Instructions:  Your physician recommends the following medication changes.  START TAKING: Ranexa  500 mg twice daily Repatha  140 mg injection every 14 days  *If you need a refill on your cardiac medications before your next appointment, please call your pharmacy*  Lab Work: None ordered at this time   Follow-Up: At [facility], you and your health needs are our priority.  As part of our continuing mission to provide you with exceptional heart care, our providers are all part of one team.  This team includes your primary Cardiologist (physician) and Advanced Practice Providers or APPs (Physician Assistants and Nurse Practitioners) who all work together to provide you with the care you need, when you need it.  Your next appointment:   3 month(s)  Provider:   You may see Deatrice Cage, MD or Bernardino Bring, PA-C  We recommend signing up for the patient portal called MyChart.  Sign up information is provided on this After Visit Summary.  MyChart is used to connect with patients for Virtual Visits (Telemedicine).  Patients are able to view lab/test results, encounter  notes, upcoming appointments, etc.  Non-urgent messages can be sent to your provider as well.   To learn more about what you can do with MyChart, go to [email or web address].    Signed, Miriam FORBES Shams, NP  02/22/2024 10:12 AM    [facility]

## 2024-02-22 ENCOUNTER — Other Ambulatory Visit (HOSPITAL_COMMUNITY): Payer: Self-pay

## 2024-02-22 ENCOUNTER — Encounter: Payer: Self-pay | Admitting: Physician Assistant

## 2024-02-22 ENCOUNTER — Encounter: Payer: Self-pay | Admitting: Nurse Practitioner

## 2024-02-22 ENCOUNTER — Other Ambulatory Visit: Payer: Self-pay | Admitting: Cardiovascular Disease

## 2024-02-22 ENCOUNTER — Ambulatory Visit: Attending: Physician Assistant | Admitting: Emergency Medicine

## 2024-02-22 VITALS — BP 112/72 | HR 78 | Ht 65.0 in | Wt 203.0 lb

## 2024-02-22 DIAGNOSIS — Z952 Presence of prosthetic heart valve: Secondary | ICD-10-CM | POA: Diagnosis not present

## 2024-02-22 DIAGNOSIS — Z951 Presence of aortocoronary bypass graft: Secondary | ICD-10-CM

## 2024-02-22 DIAGNOSIS — I10 Essential (primary) hypertension: Secondary | ICD-10-CM | POA: Diagnosis not present

## 2024-02-22 DIAGNOSIS — I350 Nonrheumatic aortic (valve) stenosis: Secondary | ICD-10-CM

## 2024-02-22 DIAGNOSIS — Z758 Other problems related to medical facilities and other health care: Secondary | ICD-10-CM

## 2024-02-22 DIAGNOSIS — E782 Mixed hyperlipidemia: Secondary | ICD-10-CM | POA: Diagnosis not present

## 2024-02-22 DIAGNOSIS — I25118 Atherosclerotic heart disease of native coronary artery with other forms of angina pectoris: Secondary | ICD-10-CM

## 2024-02-22 DIAGNOSIS — Z603 Acculturation difficulty: Secondary | ICD-10-CM

## 2024-02-22 MED ORDER — REPATHA SURECLICK 140 MG/ML ~~LOC~~ SOAJ: 140.0000 mg | SUBCUTANEOUS | 3 refills | Status: DC

## 2024-02-22 MED ORDER — RANOLAZINE ER 500 MG PO TB12: 500.0000 mg | ORAL_TABLET | Freq: Two times a day (BID) | ORAL | 11 refills | Status: DC

## 2024-02-22 NOTE — Assessment & Plan Note (Signed)
 Hypertension is managed with losartan , metoprolol , and amlodipine . Blood pressure stable, at goal in office. He experiences occasional chest pain with elevated BP, but cardiology evaluation is normal. Continue losartan , metoprolol , and amlodipine . Monitor blood pressure at home daily.

## 2024-02-22 NOTE — Assessment & Plan Note (Signed)
 Hypercholesterolemia is managed with rosuvastatin . Continue rosuvastatin . Check lipid panel.

## 2024-02-22 NOTE — Assessment & Plan Note (Signed)
 Moderate persistent asthma is confirmed by pulmonary function tests. He uses Symbicort  with some improvement. Prescribe Albuterol  for significant shortness of breath or wheezing. Continue Symbicort , two puffs morning and night. Follow up with Pulm as scheduled.

## 2024-02-22 NOTE — Patient Instructions (Signed)
 Medication Instructions:  Your physician recommends the following medication changes.  START TAKING: Ranexa  500 mg twice daily Repatha  140 mg injection every 14 days  *If you need a refill on your cardiac medications before your next appointment, please call your pharmacy*  Lab Work: None ordered at this time   Follow-Up: At [facility], you and your health needs are our priority.  As part of our continuing mission to provide you with exceptional heart care, our providers are all part of one team.  This team includes your primary Cardiologist (physician) and Advanced Practice Providers or APPs (Physician Assistants and Nurse Practitioners) who all work together to provide you with the care you need, when you need it.  Your next appointment:   3 month(s)  Provider:   You may see Deatrice Cage, MD or Bernardino Bring, PA-C  We recommend signing up for the patient portal called MyChart.  Sign up information is provided on this After Visit Summary.  MyChart is used to connect with patients for Virtual Visits (Telemedicine).  Patients are able to view lab/test results, encounter notes, upcoming appointments, etc.  Non-urgent messages can be sent to your provider as well.   To learn more about what you can do with MyChart, go to [email or web address].

## 2024-02-22 NOTE — Assessment & Plan Note (Signed)
 Type 2 diabetes is managed with metformin  500 mg once daily, and the last A1c was well controlled at 6.3. Continue metformin  and check A1c today. Encourage healthy diet and regular exercise.

## 2024-02-26 ENCOUNTER — Other Ambulatory Visit (HOSPITAL_COMMUNITY): Payer: Self-pay

## 2024-02-29 ENCOUNTER — Other Ambulatory Visit: Payer: Self-pay | Admitting: Nurse Practitioner

## 2024-02-29 DIAGNOSIS — E119 Type 2 diabetes mellitus without complications: Secondary | ICD-10-CM

## 2024-02-29 MED ORDER — METFORMIN HCL ER 500 MG PO TB24: 500.0000 mg | ORAL_TABLET | Freq: Two times a day (BID) | ORAL | 3 refills | Status: AC

## 2024-03-01 NOTE — Telephone Encounter (Signed)
 Copied from CRM #[id number]. Topic: General - Call Back - No Documentation >> Mar 01, 2024  9:45 AM [place] E wrote: Reason for CRM: Patient stated he missed a call from the office today, agent could not locate what this might have been in regards too, besides that his metformin  got sent it, which agent relayed. Please call patient if he needs to be advised of anything else.

## 2024-03-04 NOTE — Telephone Encounter (Signed)
 Copied from CRM #[id number]. Topic: Appointments - Appointment Info/Confirmation >> Mar 04, 2024 11:28 AM Rosina BIRCH wrote: Patient/patient representative is calling for information regarding an appointment.   Patient returning a call to the cma

## 2024-03-13 ENCOUNTER — Other Ambulatory Visit: Payer: Self-pay | Admitting: Physician Assistant

## 2024-03-27 ENCOUNTER — Ambulatory Visit: Admitting: Pharmacist

## 2024-04-17 NOTE — Progress Notes (Unsigned)
 Patient ID: James Yates                 DOB: 1955/11/21                    MRN: [id number]      HPI: James Yates is a 68 y.o. male patient referred to lipid clinic by  Miriam Shams, NP. 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  Patient was last seen in office by Miriam Shams, NP on 02/22/24. During this visit, he had continued complaints of substernal, 8 out of 10, stabbing in nature, chest pain primarily on exertion. LDL on 02/13/24 was 111. Patient is currently on rosuvastatin  40 mg once daily. Patient was prescribed Repatha  140 mg subcutenously every 2 weeks at this visit. Patient was referred to PharmD for complete the prior authorization process for Repatha  and Ranexa  for chest pain, as the patient was unable to afford Ranexa  in the past.   Reviewed options for lowering LDL cholesterol, including ezetimibe , PCSK-9 inhibitors, bempedoic acid and inclisiran.  Discussed mechanisms of action, dosing, side effects and potential decreases in LDL cholesterol.  Also reviewed cost information and potential options for patient assistance.  Current Medications: rosuvastatin  40 mg once daily Intolerances: Not sure if intolerant, but previously has been on atorvastatin  40 mg and 80 mg, ezetimibe  10 mg Risk Factors: HTN, age, T2DM LDL-C goal: <55 ApoB goal:   Diet:   Exercise:   Family History:   Social History:   Labs: Lipid Panel     Component Value Date/Time   CHOL 212 (H) 02/13/2024 1201   CHOL 245 (H) 02/14/2023 1206   CHOL 215 (H) 07/02/2013 0058   TRIG 299.0 (H) 02/13/2024 1201   TRIG 143 07/02/2013 0058   HDL 41.70 02/13/2024 1201   HDL 41 02/14/2023 1206   HDL 38 (L) 07/02/2013 0058   CHOLHDL 5 02/13/2024 1201   VLDL 59.8 (H) 02/13/2024 1201   VLDL 29 07/02/2013 0058    LDLCALC 111 (H) 02/13/2024 1201   LDLCALC 162 (H) 02/14/2023 1206   LDLCALC 148 (H) 07/02/2013 0058   LDLDIRECT 141.0 08/08/2023 1437   LABVLDL 42 (H) 02/14/2023 1206    Past Medical History:  Diagnosis Date   Aortic atherosclerosis    Aortic stenosis due to bicuspid aortic valve    a.) TTE 01/26/15: mild-mod AS (MPG 19; VTI 1.18); b.) TTE 07/20/15: mild AS (MPG 13; VTI 1.06); c.) LHC 10/29/15: mod AS (peak-peak grad 22); d.) TTE 08/23/16: mod AS (MPG 21; VTI 0.93); e.) TTE 10/10/17: sev AS (MPG 38; VTI 0.6); f.) R/LHC 11/27/17: mod-sev AS (MPG 26; VTI 1.2); g.) TTE 02/27/18: mod AS (MPG 30; VTI 0.52); h.) TTE 03/13/18: sev AS (MPG 33; VTI 0.94); i.) s/p TAVR 03/13/2018   Bilateral carotid artery stenosis    a.) doppler 02/27/2018: 1-39% BICA   Cervical radiculopathy    CHF (congestive heart failure) (HCC)    Coronary artery disease    a.) NSTEMI 07/02/2013 --> 4v CABG 07/05/2013 (LIMA-LAD, SVG-PDA, SVG-OM1, SVG-OM3); b.) MV 10/19/2015: LAD territory isch; c.) LHC 10/29/2015: 30% pRCA, 100% mRCA, 90% o-mLAD, 70% oD1-D1, 30% dLAD, 100% oD2-D2, 100% pLCx, 100% SVG-OM3 -- med mgmt; d.   GERD (gastroesophageal reflux disease)    Glucose intolerance (impaired glucose tolerance)    Headache  chronic, no red flag or atypical signs or symptoms   Hyperlipidemia    Hypertension    Lumbar radiculopathy    Lumbar spondylosis    NSTEMI (non-ST elevated myocardial infarction) (HCC) 07/02/2013   a.) LHC 07/02/2013: 80% pLAD, 90% D1, 95% pLCx, 99% mLCx, 90% OM2, 60% OM3, 40% left AV groove, 50% pRCA, 40% dRCA, 90% RPL1 --> CVTS; b.) s/p 4v CABG 07/05/2013   Obesity    Onychomycosis    RBBB (right bundle branch block)    S/P CABG x 4 07/05/2013   a.) LIMA-LAD, SVG-PDA, SVG-OM1, SVG-OM3   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 8.5 g 2   amLODipine  (NORVASC ) 10 MG tablet Take 1 tablet (10 mg total) by mouth daily. 90 tablet 3   aspirin  EC 81 MG tablet Take 1 tablet (81 mg total) by mouth daily. Resume 07/01/22     budesonide -formoterol  (SYMBICORT ) 160-4.5 MCG/ACT inhaler Inhale 2 puffs into the lungs 2 (two) times daily. (Patient not taking: Reported on 02/22/2024) 1 each 12   cyclobenzaprine  (FLEXERIL ) 5 MG tablet Take 1 tablet (5 mg total) by mouth 3 (three) times daily as needed for muscle spasms. 60 tablet 0   Evolocumab  (REPATHA  SURECLICK) 140 MG/ML SOAJ Inject 140 mg into the skin every 14 (fourteen) days. 6 mL 3   gabapentin  (NEURONTIN ) 300 MG capsule Take 1 capsule (300 mg total) by mouth 2 (two) times daily. 180 capsule 2   losartan  (COZAAR ) 100 MG tablet Take 1 tablet (100 mg total) by mouth daily. 90 tablet 3   metFORMIN  (GLUCOPHAGE -XR) 500 MG 24 hr tablet Take 1 tablet (500 mg total) by mouth 2 (two) times daily with a meal. 180 tablet 3   metoprolol  tartrate (LOPRESSOR ) 100 MG tablet Take 1 tablet (100 mg total) by mouth 2 (two) times daily. 180 tablet 3   pantoprazole  (PROTONIX ) 40 MG tablet Take 1 tablet (40 mg total) by mouth daily. 90 tablet 3   ranolazine  (RANEXA ) 500 MG 12 hr tablet Take 1 tablet (500 mg total) by mouth 2 (two) times daily. 60 tablet 11   rosuvastatin  (CRESTOR ) 40 MG tablet Take 1 tablet (40 mg total) by mouth daily. 90 tablet 3   sertraline  (ZOLOFT ) 100 MG tablet Take 1 tablet (100 mg total) by mouth daily. 90 tablet 3   Spacer/Aero-Holding Chambers (AEROCHAMBER MV) inhaler Use as instructed 1 each 0   No current facility-administered medications on file prior to visit.    No Known Allergies  Assessment/Plan:  1. Hyperlipidemia -  No problem-specific Assessment & Plan notes found  for this encounter.    Thank you,  BSABRA Amon Rocher, PharmD PGY-1 Pharmacy Resident [facility] 04/17/2024 12:58 PM   Melissa D Maccia, Pharm.JONETTA SARAN, CPP [facility] A Division of [facility] [address] [place], [place] [address]  Phone: [phone]; Fax: [phone]

## 2024-04-18 ENCOUNTER — Other Ambulatory Visit: Payer: Self-pay

## 2024-04-18 ENCOUNTER — Other Ambulatory Visit (HOSPITAL_COMMUNITY): Payer: Self-pay

## 2024-04-18 ENCOUNTER — Telehealth: Payer: Self-pay | Admitting: Licensed Clinical Social Worker

## 2024-04-18 ENCOUNTER — Telehealth: Payer: Self-pay | Admitting: Pharmacy Technician

## 2024-04-18 ENCOUNTER — Ambulatory Visit: Admitting: Pharmacist

## 2024-04-18 DIAGNOSIS — E7800 Pure hypercholesterolemia, unspecified: Secondary | ICD-10-CM | POA: Diagnosis not present

## 2024-04-18 MED ORDER — REPATHA SURECLICK 140 MG/ML ~~LOC~~ SOAJ
1.0000 mL | SUBCUTANEOUS | 3 refills | Status: AC
  Filled 2024-04-18: qty 6, 84d supply, fill #0

## 2024-04-18 NOTE — Patient Instructions (Addendum)
 Reciba informacin gratuita e imparcial sobre los productos de atencin mdica de [employer] de los consejeros del Programa de informacin sobre seguros mdicos para financial planner, por sus consejeros [employer] Program ([place]), a travs de la lnea gratuita [phone]. [facility] puede ayudar a charlar sobre el fraude a Medicare   La autorizacin previa para Repatha  ha sido aprobada y puede recogerlo en su farmacia. Le llamar en cuanto tenga noticias. Si tiene [employer], llmeme al [phone].  [facility]  [phone]  Repatha  es un medicamento para el colesterol que mejora la capacidad del cuerpo para eliminar el colesterol malo, conocido como LDL. Puede reducir su LDL hasta un 60%! Se administra mediante una inyeccin subcutnea cada dos semanas. Este medicamento suele requerir una autorizacin previa de su compaa de seguros. Nosotros nos encargaremos de enviar toda la informacin necesaria a su compaa de seguros para obtener la aprobacin.  Los efectos secundarios ms comunes de Repatha  incluyen secrecin nasal, sntomas de resfriado comn, raramente sntomas de gripe o similares, dolor de espalda o muscular en aproximadamente el 3-4% de los pacientes, y enrojecimiento, dolor o hematomas en el lugar de la inyeccin.  Informe a su mdico si experimenta algn efecto secundario que le moleste o que no desaparezca.

## 2024-04-18 NOTE — Telephone Encounter (Signed)
 H&V Care Navigation CSW Progress Note  Clinical Social Worker contacted patient by phone to f/u on patient assistance for medications. Had been advised by PharmD to contact [facility] team and provided two numbers to ensure Spanish language staff member/volunteer available. I attempted to call pt and review this with him again as well but no answer at [phone], left voicemail with assistance of Spanish language interpreter Janas [phone]. Will re-attempt again as able.  Patient is participating in a Managed Medicaid Plan:  No, UHC Medicare  SDOH Screenings   Depression (PHQ2-9): Low Risk  (02/13/2024)  Tobacco Use: Low Risk  (02/22/2024)    Marit Lark, MSW, LCSW Clinical Social Worker II [facility] Heart/Vascular Care Navigation  [phone]- work cell phone (preferred)

## 2024-04-18 NOTE — Telephone Encounter (Signed)
 Patient Advocate Encounter   The patient was approved for a [employer] grant that will help cover the cost of repatha  Total amount awarded, 2500.  Effective: 03/19/24 - 03/18/25   APW:[id number] ERW:[id number] Hmnle:[id number] PI:[id number] [employer] ID: [id number]   Pharmacy provided with approval and processing information. Patient informed via rph   136.28 a month on just insurance  [place] in [place]  Ranexa  too soon until 04/30/24 to get price

## 2024-04-18 NOTE — Assessment & Plan Note (Signed)
 Assessment: Last LDL was 111 on 02/13/24, which is above goal of <55 while on rosuvastatin  40 mg once daily Patient is tolerating rosuvastatin  well with no side effects reported Patient is interested in starting an injectable lipid-lowering medication Patient is eligible for the [employer], which will bring the copay to $0 for the patient Plan: Start Repatha  140 mg injected under the skin every 2 weeks Continue rosuvastatin  40 mg once daily Limit fried and high-fat/greasy foods and incorporate more fruits and vegetables into the diet Increase exercise to 30 minutes for at least 5 days a week Follow up lipid panel in 3 months

## 2024-04-19 ENCOUNTER — Telehealth: Payer: Self-pay | Admitting: Licensed Clinical Social Worker

## 2024-04-19 NOTE — Telephone Encounter (Signed)
 H&V Care Navigation CSW Progress Note  Clinical Social Worker contacted patient by phone to f/u on patient assistance for medications. Had been advised by PharmD yesterday to contact [facility] team and provided two numbers to ensure Spanish language staff member/volunteer available. I attempted to call pt again today and review this with him but no answer at [phone], left 2nd voicemail with assistance of Spanish language interpreter [place], [phone]. Will re-attempt again as able  Patient is participating in a Managed Medicaid Plan:  No, UHC Medicare  SDOH Screenings   Depression (PHQ2-9): Low Risk  (02/13/2024)  Tobacco Use: Low Risk  (02/22/2024)     Marit Lark, MSW, LCSW Clinical Social Worker II [facility] Heart/Vascular Care Navigation  [phone]- work cell phone (preferred)

## 2024-04-30 ENCOUNTER — Telehealth: Payer: Self-pay | Admitting: Licensed Clinical Social Worker

## 2024-04-30 NOTE — Telephone Encounter (Signed)
 H&V Care Navigation CSW Progress Note  Clinical Social Worker contacted patient by phone to f/u on information provided for [facility] team and Extra Help program. Reached him at [phone], with assistance of Spanish language interpreter Caprice [phone]. States he hasn't called [facility] thinks the number is in his car, has otherwise been taking medications without issue. Confirmed if any questions about dosing etc to call main office. Pt states no issue with housing, utilities or obtaining food at this time. Encouraged him to let us  know if we could be of any additional assistance moving forward and encouraged him to call and let us  know.  Patient is participating in a Managed Medicaid Plan:  No, UHC Medicare  SDOH Screenings   Food Insecurity: No Food Insecurity (04/30/2024)  Housing: Low Risk  (04/30/2024)  Transportation Needs: No Transportation Needs (04/30/2024)  Utilities: Not At Risk (04/30/2024)  Depression (PHQ2-9): Low Risk  (02/13/2024)  Financial Resource Strain: Low Risk  (04/30/2024)  Tobacco Use: Low Risk  (02/22/2024)  Health Literacy: Adequate Health Literacy (04/30/2024)    Marit Lark, MSW, LCSW Clinical Social Worker II [facility] Health Heart/Vascular Care Navigation  [phone]- work cell phone (preferred)

## 2024-05-21 DIAGNOSIS — E119 Type 2 diabetes mellitus without complications: Secondary | ICD-10-CM

## 2024-05-23 NOTE — Progress Notes (Unsigned)
 " Cardiology Office Note:    Date:  05/24/2024   ID:  James Yates, DOB November 18, 1955, MRN [id number]  PCP:  Gretel App, NP    [facility] Providers Cardiologist:  Deatrice Cage, MD     Referring MD: Gretel App, NP   Chief complaint: 5-month follow-up     History of Present Illness:   Jakavion Bilodeau is a 68 y.o. male with a hx of CAD s/p CABG X4, (LIMA-LAD, SVG-PDA, seqSVG-OM1/OM 3) in 06/2013 following a NSTEMI with chronic angina, bicuspid aortic valve with severe stenosis s/p TAVR in 02/2018, paroxysmal SVT, ILD, HTN, HLD, and RBBB who presents for 2-month follow-up.   LHC in 2017 revealed the seqSVG-OM1/OM3 100% occluded.  Reviewed for CTO, not a candidate.  Underwent a TAVR 2019. Zio in 2021 showed a predominant rhythm of sinus with an average heart rate of 89 bpm, IVCD was noted, 10 episodes of SVT with the longest episode lasting 16 beats with an average heart rate of 152 bpm, rare PACs and PVCs.  Some patient triggered events correlated with PVCs and sinus tachycardia.    Lost health insurance in late 2022, went off antianginal medications and began having chest discomfort.  Was able to get back on insurance and restart medication in early 2023.  Later that year continued to have exertional chest discomfort.  Stress test 02/10/2022 low risk, no significant ischemia, small region of fixed defect in the apical, distal anterior apical, and inferior apical region and an EF of 46%. Echo on 04/18/2022 demonstrated an EF of 55 to 60%, no regional wall motion abnormalities, indeterminate LV diastolic function parameters, low normal RV systolic function with normal ventricular cavity size, normal structure and function of aortic valve prosthesis, and an estimated right atrial pressure of 3 mmHg.  Could not afford ranolazine , Imdur  worsened headache.   ED visit 01/24/2023 for multiple complaints including chest pressure relieved with nitroglycerin , headache, dizziness, imbalance.  High-sensitivity troponin 29 with a delta troponin 26.  Negative CXR, negative CTA head/neck, CTA chest negative for PE, MRI of brain negative.   LHC 02/20/2023 showed no significant change in coronary anatomy since in 2019, medical therapy recommended.  OV 05/2023 patient reported improved symptoms and BP control.  Most recently seen on 12/19/2023, stable from a cardiac standpoint at that time.  Continued to have exertional chest pressure/SOB that resolved with rest.  Was being evaluated by pulmonology for ILD.  Cardiac PET ordered to rule out significant ischemia/small vessel disease.   NM PET 01/18/2024: LV perfusion abnormal.  Reversible defect present in the apical-base lateral locations, RWMA present in that area, consistent with ischemia in the LCfx territory.  LV function normal, rest EF 60%, stress EF 64%, mild TID 1.18.  Findings correlate with known CTO of LCfx/seqSVG, intermediate risk study.  Previous visit September 2025 patient had complained of continued chronic chest pain with exertion, unchanged from previous visits.  He previously was unable to afford Ranexa , was referred to pharmD for prior auth at previous visit. LDL was above goal on 40 mg of rosuvastatin  daily, was referred to lipid clinic and placed on Repatha  in November.  Presents independently, stable from a cardiovascular standpoint. Interpreting services used for the duration of the visit. Reports continued chronic and stable central CP exacerbated w/ exercise, combined w/ SOB that relieves w/ rest, unchanged since previous visits. Walks 3 days/week, able to walk for 15 minutes prior to needing a break, patient states his stamina is improving. Reports sensations of the room spinning  when he turns to get out of bed in the morning, resolves shortly after starting if he continues to sit. Denies near syncope, lightheadedness, dark/tarry/bloody stools, hematuria, weight changes, edema. Works in a bakery, rolls dough frequently, which he  states is about the most physically active thing he does in his life aside from regular walking.   ROS:   Please see the history of present illness.    All other systems reviewed and are negative.     Past Medical History:  Diagnosis Date   Aortic atherosclerosis    Aortic stenosis due to bicuspid aortic valve    a.) TTE 01/26/15: mild-mod AS (MPG 19; VTI 1.18); b.) TTE 07/20/15: mild AS (MPG 13; VTI 1.06); c.) LHC 10/29/15: mod AS (peak-peak grad 22); d.) TTE 08/23/16: mod AS (MPG 21; VTI 0.93); e.) TTE 10/10/17: sev AS (MPG 38; VTI 0.6); f.) R/LHC 11/27/17: mod-sev AS (MPG 26; VTI 1.2); g.) TTE 02/27/18: mod AS (MPG 30; VTI 0.52); h.) TTE 03/13/18: sev AS (MPG 33; VTI 0.94); i.) s/p TAVR 03/13/2018   Bilateral carotid artery stenosis    a.) doppler 02/27/2018: 1-39% BICA   Cervical radiculopathy    CHF (congestive heart failure) (HCC)    Coronary artery disease    a.) NSTEMI 07/02/2013 --> 4v CABG 07/05/2013 (LIMA-LAD, SVG-PDA, SVG-OM1, SVG-OM3); b.) MV 10/19/2015: LAD territory isch; c.) LHC 10/29/2015: 30% pRCA, 100% mRCA, 90% o-mLAD, 70% oD1-D1, 30% dLAD, 100% oD2-D2, 100% pLCx, 100% SVG-OM3 -- med mgmt; d.   GERD (gastroesophageal reflux disease)    Glucose intolerance (impaired glucose tolerance)    Headache    chronic, no red flag or atypical signs or symptoms   Hyperlipidemia    Hypertension    Lumbar radiculopathy    Lumbar spondylosis    NSTEMI (non-ST elevated myocardial infarction) (HCC) 07/02/2013   a.) LHC 07/02/2013: 80% pLAD, 90% D1, 95% pLCx, 99% mLCx, 90% OM2, 60% OM3, 40% left AV groove, 50% pRCA, 40% dRCA, 90% RPL1 --> CVTS; b.) s/p 4v CABG 07/05/2013   Obesity    Onychomycosis    RBBB (right bundle branch block)    S/P CABG x 4 07/05/2013   a.) LIMA-LAD, SVG-PDA, SVG-OM1, SVG-OM3   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   a.) 26 mm Edwards Sapien 3 transcatheter heart valve placed via percutaneous right transfemoral approach    Past  Surgical History:  Procedure Laterality Date   CARDIAC CATHETERIZATION  06/30/2013   [facility]   CARDIAC CATHETERIZATION N/A 10/29/2015   Procedure: Left Heart Cath and Cors/Grafts Angiography;  Surgeon: Deatrice DELENA Cage, MD;  Location: [facility] INVASIVE CV LAB;  Service: Cardiovascular;  Laterality: N/A;   CORONARY ARTERY BYPASS GRAFT N/A 07/05/2013   Procedure: CORONARY ARTERY BYPASS GRAFTING (CABG);  Surgeon: Maude Fleeta Ochoa, MD;  Location: [facility] OR;  Service: Open Heart Surgery;  Laterality: N/A;   INTRAOPERATIVE TRANSESOPHAGEAL ECHOCARDIOGRAM N/A 07/05/2013   Procedure: INTRAOPERATIVE TRANSESOPHAGEAL ECHOCARDIOGRAM;  Surgeon: Maude Fleeta Ochoa, MD;  [facility];  Service: Open Heart Surgery;  Laterality: N/A;   INTRAOPERATIVE TRANSTHORACIC ECHOCARDIOGRAM N/A 03/13/2018   Procedure: INTRAOPERATIVE TRANSTHORACIC ECHOCARDIOGRAM;  Surgeon: Verlin Lonni BIRCH, MD;  Location: [facility] OR;  Service: Open Heart Surgery;  Laterality: N/A;   LEFT HEART CATH AND CORS/GRAFTS ANGIOGRAPHY N/A 02/20/2023   Procedure: LEFT HEART CATH AND CORS/GRAFTS ANGIOGRAPHY;  Surgeon: Cage Deatrice DELENA, MD;  Location: [facility] INVASIVE CV LAB;  Service: Cardiovascular;  Laterality: N/A;   LUMBAR LAMINECTOMY/DECOMPRESSION MICRODISCECTOMY Right 06/29/2023   Procedure: RIGHT  L4-5 HEMILAMINECTOMY AND DISCECTOMY;  Surgeon: Claudene Penne ORN, MD;  Location: [facility] ORS;  Service: Neurosurgery;  Laterality: Right;   RIGHT/LEFT HEART CATH AND CORONARY ANGIOGRAPHY Bilateral 11/27/2017   Procedure: RIGHT/LEFT HEART CATH AND CORONARY ANGIOGRAPHY;  Surgeon: Darron Deatrice LABOR, MD;  Location: [facility] INVASIVE CV LAB;  Service: Cardiovascular;  Laterality: Bilateral;   TRANSCATHETER AORTIC VALVE REPLACEMENT, TRANSFEMORAL N/A 03/13/2018   Procedure: TRANSCATHETER AORTIC VALVE REPLACEMENT, TRANSFEMORAL. Edwards Sapien 3 Transcatheter Heart Valve size 26mm.;  Surgeon: Verlin Lonni BIRCH, MD;  Location: [facility] OR;  Service: Open Heart Surgery;  Laterality: N/A;     Current Medications: Active Medications[1]   Allergies:   Patient has no known allergies.   Social History   Socioeconomic History   Marital status: Married    Spouse name: Aurelia   Number of children: 3   Years of education: Not on file   Highest education level: Not on file  Occupational History   Not on file  Tobacco Use   Smoking status: Never    Passive exposure: Never   Smokeless tobacco: Never  Vaping Use   Vaping status: Never Used  Substance and Sexual Activity   Alcohol use: No   Drug use: No   Sexual activity: Not on file  Other Topics Concern   Not on file  Social History Narrative   Lives with wife at home and son Sherlean    Social Drivers of Health   Tobacco Use: Low Risk (05/24/2024)   Patient History    Smoking Tobacco Use: Never    Smokeless Tobacco Use: Never    Passive Exposure: Never  Financial Resource Strain: Low Risk (04/30/2024)   Overall Financial Resource Strain (CARDIA)    Difficulty of Paying Living Expenses: Not very hard  Food Insecurity: No Food Insecurity (04/30/2024)   Epic    Worried About Radiation Protection Practitioner of Food in the Last Year: Never true    Ran Out of Food in the Last Year: Never true  Transportation Needs: No Transportation Needs (04/30/2024)   Epic    Lack of Transportation (Medical): No    Lack of Transportation (Non-Medical): No  Physical Activity: Not on file  Stress: Not on file  Social Connections: Not on file  Depression (PHQ2-9): Low Risk (02/13/2024)   Depression (PHQ2-9)    PHQ-2 Score: 0  Alcohol Screen: Not on file  Housing: Low Risk (04/30/2024)   Epic    Unable to Pay for Housing in the Last Year: No    Number of Times Moved in the Last Year: 0    Homeless in the Last Year: No  Utilities: Not At Risk (04/30/2024)   Epic    Threatened with loss of utilities: No  Health Literacy: Adequate Health Literacy (04/30/2024)   B1300 Health Literacy    Frequency of need for help with medical instructions: Rarely      Family History: The patient's family history includes Heart attack in his father; Heart disease in his father.  EKGs/Labs/Other Studies Reviewed:    The following studies were reviewed today:  EKG Interpretation Date/Time:  Friday May 24 2024 09:55:00 EST Ventricular Rate:  75 PR Interval:  172 QRS Duration:  136 QT Interval:  432 QTC Calculation: 482 R Axis:   78  Text Interpretation: Normal sinus rhythm Right bundle branch block  No significant change from prior studies Confirmed by Elaine Moloney [id number]) on 05/24/2024 10:00:33 AM    Recent Labs: 08/08/2023: Hemoglobin 16.1; Platelets 198.0; TSH 2.18 10/13/2023: B  Natriuretic Peptide 34.8 02/13/2024: ALT 24; BUN 15; Creatinine, Ser 0.95; Potassium 3.5; Sodium 135  Recent Lipid Panel    Component Value Date/Time   CHOL 212 (H) 02/13/2024 1201   CHOL 245 (H) 02/14/2023 1206   CHOL 215 (H) 07/02/2013 0058   TRIG 299.0 (H) 02/13/2024 1201   TRIG 143 07/02/2013 0058   HDL 41.70 02/13/2024 1201   HDL 41 02/14/2023 1206   HDL 38 (L) 07/02/2013 0058   CHOLHDL 5 02/13/2024 1201   VLDL 59.8 (H) 02/13/2024 1201   VLDL 29 07/02/2013 0058   LDLCALC 111 (H) 02/13/2024 1201   LDLCALC 162 (H) 02/14/2023 1206   LDLCALC 148 (H) 07/02/2013 0058   LDLDIRECT 141.0 08/08/2023 1437         Physical Exam:    VS:  BP 122/74   Pulse 75   Ht 5' 5 (1.651 m)   Wt 204 lb 3.2 oz (92.6 kg)   SpO2 96%   BMI 33.98 kg/m        Wt Readings from Last 3 Encounters:  05/24/24 204 lb 3.2 oz (92.6 kg)  02/22/24 203 lb (92.1 kg)  02/13/24 204 lb 9.6 oz (92.8 kg)     GEN:  Well nourished, well developed in no acute distress HEENT: Normal NECK: No carotid bruits CARDIAC:  S1-S2 normal, RRR, systolic murmur over right sternal border/2nd IC space (1/6), no rubs, gallops RESPIRATORY:  Clear to auscultation without rales, wheezing or rhonchi  MUSCULOSKELETAL:  No edema; No deformity  SKIN: Warm and dry NEUROLOGIC:  Alert and  oriented x 3 PSYCHIATRIC:  Normal affect       Assessment & Plan Coronary artery disease of native artery of native heart with stable angina pectoris S/P CABG x 4 LHC 01/2023 unchanged from previous cath, significant disease in native vessels, occluded seqSVG-OM1/OM 3 NM PET 12/2023: Ischemia in the LCfx territory, consistent with known CTO EKG: NSR, 75 bpm, RBBB, no change from prior study Reports continued CP with associated SOB, palp, nausea, occasional dizziness Continue maximum daily dose of Lopressor  100 mg twice daily Continue maximum daily dose of amlodipine  10 mg daily Continue aspirin  EC 81 mg daily Continue losartan  100 mg daily Continue rosuvastatin  40 mg daily Will refill nitroglycerin : Continue nitroglycerin  0.4 mg SL tab PRN chest pain every 5 minutes Unable to afford Ranexa  Patient agreeable to a trial of low dose Imdur  to help with anginal symptoms Start Imdur  15 mg daily S/P TAVR (transcatheter aortic valve replacement) DOE (dyspnea on exertion) Echo 04/18/2022: Aortic valve regurgitation not visualized, mean gradient 11.0 mmHg.  Peak gradient 20.9 mmHg, valve area by VTI measures 1.37 cm, 26 mm SAPIEN prosthetic stented TAVR valve present.  Findings consistent with normal structure and function of the AV valve prosthesis. Mild systolic murmur over right sternal border/2nd IC space (1/6) Continue aspirin  EC 81 mg SBE prophylaxis indicated for all dental procedures. Patient reports continued SOB, on-going over a year, occasionally at rest, primarily w/ exertion Will repeat echo today to rule out cardiomyopathies or valvular abnormalities as source of chronic SOB Primary hypertension BP remains well-controlled in office Takes all of his BP meds at night, with exception of a.m. dose of metoprolol .  Typically checks his BP prior to night time medications Usually ranges in the 130s/80s systolic Continue amlodipine , losartan , Lopressor  as described above Add imdur  15 mg  daily Low-sodium diet recommended Mixed hyperlipidemia Lipid panel 02/13/2024: Cholesterol 212, LDL 111, triglycerides 299, HDL 41 Qualified for financial assistance program w/  PharmD for repatha  on 04/18/2024 LDL goal < 55 Reports he's had 3 doses of Repatha  to date, tolerating well so far Continue Repatha  140 mg [place] inj every 2 weeks  Continue Crestor  40 mg daily No longer on Zetia  Recheck fasting lipids today  Language barrier Interpreter used for the duration of the visit today  Disposition: Follow up in 4 months or sooner if needed.            Medication Adjustments/Labs and Tests Ordered: Current medicines are reviewed at length with the patient today.  Concerns regarding medicines are outlined above.  Orders Placed This Encounter  Procedures   Lipid panel   Hepatic function panel   EKG 12-Lead   ECHOCARDIOGRAM COMPLETE   Meds ordered this encounter  Medications   nitroGLYCERIN  (NITROSTAT ) 0.4 MG SL tablet    Sig: Place 1 tablet (0.4 mg total) under the tongue every 5 (five) minutes as needed for chest pain.    Dispense:  90 tablet    Refill:  3   isosorbide  mononitrate (IMDUR ) 30 MG 24 hr tablet    Sig: Take 0.5 tablets (15 mg total) by mouth daily.    Dispense:  45 tablet    Refill:  3    Patient Instructions  Medication Instructions:  Your physician recommends the following medication changes.    START TAKING: Nitroglycerin  as needed. Imdur  15 mg daily.   *If you need a refill on your cardiac medications before your next appointment, please call your pharmacy*  Lab Work: Your provider would like for you to have following labs drawn today Lipid and Liver Function.     Testing/Procedures: Your physician has requested that you have an echocardiogram. Echocardiography is a painless test that uses sound waves to create images of your heart. It provides your doctor with information about the size and shape of your heart and how well your hearts chambers  and valves are working.   You may receive an ultrasound enhancing agent through an IV if needed to better visualize your heart during the echo. This procedure takes approximately one hour.  There are no restrictions for this procedure.  This will take place at [address] [facility] [address]) [address], [place] [address]  Please note: We ask at that you not bring children with you during ultrasound (echo/ vascular) testing. Due to room size and safety concerns, children are not allowed in the ultrasound rooms during exams. Our front office staff cannot provide observation of children in our lobby area while testing is being conducted. An adult accompanying a patient to their appointment will only be allowed in the ultrasound room at the discretion of the ultrasound technician under special circumstances. We apologize for any inconvenience.   Follow-Up: At [facility], you and your health needs are our priority.  As part of our continuing mission to provide you with exceptional heart care, our providers are all part of one team.  This team includes your primary Cardiologist (physician) and Advanced Practice Providers or APPs (Physician Assistants and Nurse Practitioners) who all work together to provide you with the care you need, when you need it.  Your next appointment:   4 month(s)  Provider:  Bernardino Bring, PA-C          Signed, Quinnton Bury E Trae Bovenzi, NP  05/24/2024 10:43 AM    [facility]     [1]  Current Meds  Medication Sig   albuterol  (VENTOLIN  HFA) 108 (90 Base) MCG/ACT inhaler Inhale  1-2 puffs into the lungs every 6 (six) hours as needed for wheezing or shortness of breath.   amLODipine  (NORVASC ) 10 MG tablet Take 1 tablet (10 mg total) by mouth daily.   aspirin  EC 81 MG tablet Take 1 tablet (81 mg total) by mouth daily. Resume 07/01/22   Evolocumab  (REPATHA  SURECLICK) 140 MG/ML SOAJ Inject 140 mg into the skin every 14 (fourteen) days. Please apply  healthwell grant   gabapentin  (NEURONTIN ) 300 MG capsule Take 1 capsule (300 mg total) by mouth 2 (two) times daily.   isosorbide  mononitrate (IMDUR ) 30 MG 24 hr tablet Take 0.5 tablets (15 mg total) by mouth daily.   losartan  (COZAAR ) 100 MG tablet Take 1 tablet (100 mg total) by mouth daily.   metFORMIN  (GLUCOPHAGE -XR) 500 MG 24 hr tablet Take 1 tablet (500 mg total) by mouth 2 (two) times daily with a meal.   metoprolol  tartrate (LOPRESSOR ) 100 MG tablet Take 1 tablet (100 mg total) by mouth 2 (two) times daily.   nitroGLYCERIN  (NITROSTAT ) 0.4 MG SL tablet Place 1 tablet (0.4 mg total) under the tongue every 5 (five) minutes as needed for chest pain.   pantoprazole  (PROTONIX ) 40 MG tablet Take 1 tablet (40 mg total) by mouth daily.   ranolazine  (RANEXA ) 500 MG 12 hr tablet Take 1 tablet (500 mg total) by mouth 2 (two) times daily.   rosuvastatin  (CRESTOR ) 40 MG tablet Take 1 tablet (40 mg total) by mouth daily.   Spacer/Aero-Holding Chambers (AEROCHAMBER MV) inhaler Use as instructed   "

## 2024-05-24 ENCOUNTER — Ambulatory Visit: Attending: Physician Assistant | Admitting: Emergency Medicine

## 2024-05-24 ENCOUNTER — Encounter: Payer: Self-pay | Admitting: Physician Assistant

## 2024-05-24 VITALS — BP 122/74 | HR 75 | Ht 65.0 in | Wt 204.2 lb

## 2024-05-24 DIAGNOSIS — Z758 Other problems related to medical facilities and other health care: Secondary | ICD-10-CM

## 2024-05-24 DIAGNOSIS — R0609 Other forms of dyspnea: Secondary | ICD-10-CM

## 2024-05-24 DIAGNOSIS — I25118 Atherosclerotic heart disease of native coronary artery with other forms of angina pectoris: Secondary | ICD-10-CM | POA: Diagnosis not present

## 2024-05-24 DIAGNOSIS — I10 Essential (primary) hypertension: Secondary | ICD-10-CM | POA: Diagnosis not present

## 2024-05-24 DIAGNOSIS — Z951 Presence of aortocoronary bypass graft: Secondary | ICD-10-CM

## 2024-05-24 DIAGNOSIS — E782 Mixed hyperlipidemia: Secondary | ICD-10-CM | POA: Diagnosis not present

## 2024-05-24 DIAGNOSIS — Z952 Presence of prosthetic heart valve: Secondary | ICD-10-CM | POA: Diagnosis not present

## 2024-05-24 DIAGNOSIS — Z603 Acculturation difficulty: Secondary | ICD-10-CM | POA: Diagnosis not present

## 2024-05-24 MED ORDER — ISOSORBIDE MONONITRATE ER 30 MG PO TB24: 15.0000 mg | ORAL_TABLET | Freq: Every day | ORAL | 3 refills | Status: AC

## 2024-05-24 MED ORDER — NITROGLYCERIN 0.4 MG SL SUBL: 0.4000 mg | SUBLINGUAL_TABLET | SUBLINGUAL | 3 refills | Status: AC | PRN

## 2024-05-24 NOTE — Assessment & Plan Note (Signed)
 LHC 01/2023 unchanged from previous cath, significant disease in native vessels, occluded seqSVG-OM1/OM 3 NM PET 12/2023: Ischemia in the LCfx territory, consistent with known CTO EKG: NSR, 75 bpm, RBBB, no change from prior study Reports continued CP with associated SOB, palp, nausea, occasional dizziness Continue maximum daily dose of Lopressor  100 mg twice daily Continue maximum daily dose of amlodipine  10 mg daily Continue aspirin  EC 81 mg daily Continue losartan  100 mg daily Continue rosuvastatin  40 mg daily Will refill nitroglycerin : Continue nitroglycerin  0.4 mg SL tab PRN chest pain every 5 minutes Unable to afford Ranexa  Patient agreeable to a trial of low dose Imdur  to help with anginal symptoms Start Imdur  15 mg daily

## 2024-05-24 NOTE — Assessment & Plan Note (Signed)
 Lipid panel 02/13/2024: Cholesterol 212, LDL 111, triglycerides 299, HDL 41 Qualified for financial assistance program w/ PharmD for repatha  on 04/18/2024 LDL goal < 55 Reports he's had 3 doses of Repatha  to date, tolerating well so far Continue Repatha  140 mg [place] inj every 2 weeks  Continue Crestor  40 mg daily No longer on Zetia  Recheck fasting lipids today

## 2024-05-24 NOTE — Patient Instructions (Signed)
 Medication Instructions:  Your physician recommends the following medication changes.    START TAKING: Nitroglycerin  as needed. Imdur  15 mg daily.   *If you need a refill on your cardiac medications before your next appointment, please call your pharmacy*  Lab Work: Your provider would like for you to have following labs drawn today Lipid and Liver Function.     Testing/Procedures: Your physician has requested that you have an echocardiogram. Echocardiography is a painless test that uses sound waves to create images of your heart. It provides your doctor with information about the size and shape of your heart and how well your hearts chambers and valves are working.   You may receive an ultrasound enhancing agent through an IV if needed to better visualize your heart during the echo. This procedure takes approximately one hour.  There are no restrictions for this procedure.  This will take place at [address] [facility] [address] [facility]) [address], [place] [address]  Please note: We ask at that you not bring children with you during ultrasound (echo/ vascular) testing. Due to room size and safety concerns, children are not allowed in the ultrasound rooms during exams. Our front office staff cannot provide observation of children in our lobby area while testing is being conducted. An adult accompanying a patient to their appointment will only be allowed in the ultrasound room at the discretion of the ultrasound technician under special circumstances. We apologize for any inconvenience.   Follow-Up: At [facility], you and your health needs are our priority.  As part of our continuing mission to provide you with exceptional heart care, our providers are all part of one team.  This team includes your primary Cardiologist (physician) and Advanced Practice Providers or APPs (Physician Assistants and Nurse Practitioners) who all work together to provide you with the care you need, when  you need it.  Your next appointment:   4 month(s)  Provider:  Bernardino Bring, PA-C

## 2024-05-24 NOTE — Assessment & Plan Note (Signed)
 Echo 04/18/2022: Aortic valve regurgitation not visualized, mean gradient 11.0 mmHg.  Peak gradient 20.9 mmHg, valve area by VTI measures 1.37 cm, 26 mm SAPIEN prosthetic stented TAVR valve present.  Findings consistent with normal structure and function of the AV valve prosthesis. Mild systolic murmur over right sternal border/2nd IC space (1/6) Continue aspirin  EC 81 mg SBE prophylaxis indicated for all dental procedures. Patient reports continued SOB, on-going over a year, occasionally at rest, primarily w/ exertion Will repeat echo today to rule out cardiomyopathies or valvular abnormalities as source of chronic SOB

## 2024-05-25 LAB — HEPATIC FUNCTION PANEL
ALT: 31 IU/L (ref 0–44)
AST: 25 IU/L (ref 0–40)
Albumin: 4.6 g/dL (ref 3.9–4.9)
Alkaline Phosphatase: 147 IU/L — ABNORMAL HIGH (ref 47–123)
Bilirubin Total: 0.5 mg/dL (ref 0.0–1.2)
Bilirubin, Direct: 0.13 mg/dL (ref 0.00–0.40)
Total Protein: 7.4 g/dL (ref 6.0–8.5)

## 2024-05-25 LAB — LIPID PANEL
Chol/HDL Ratio: 2.7 ratio (ref 0.0–5.0)
Cholesterol, Total: 114 mg/dL (ref 100–199)
HDL: 43 mg/dL
LDL Chol Calc (NIH): 35 mg/dL (ref 0–99)
Triglycerides: 230 mg/dL — ABNORMAL HIGH (ref 0–149)
VLDL Cholesterol Cal: 36 mg/dL (ref 5–40)

## 2024-05-27 ENCOUNTER — Ambulatory Visit: Payer: Self-pay | Admitting: Emergency Medicine

## 2024-05-27 ENCOUNTER — Other Ambulatory Visit

## 2024-05-27 DIAGNOSIS — E119 Type 2 diabetes mellitus without complications: Secondary | ICD-10-CM

## 2024-05-27 LAB — MICROALBUMIN / CREATININE URINE RATIO
Creatinine,U: 139.2 mg/dL
Microalb Creat Ratio: 34.8 mg/g — ABNORMAL HIGH (ref 0.0–30.0)
Microalb, Ur: 4.8 mg/dL — ABNORMAL HIGH (ref 0.7–1.9)

## 2024-05-27 LAB — HEMOGLOBIN A1C: Hgb A1c MFr Bld: 7.1 % — ABNORMAL HIGH (ref 4.6–6.5)

## 2024-05-28 ENCOUNTER — Other Ambulatory Visit: Payer: Self-pay | Admitting: Nurse Practitioner

## 2024-05-28 ENCOUNTER — Ambulatory Visit: Payer: Self-pay | Admitting: Nurse Practitioner

## 2024-05-28 DIAGNOSIS — E119 Type 2 diabetes mellitus without complications: Secondary | ICD-10-CM

## 2024-05-28 MED ORDER — EMPAGLIFLOZIN 10 MG PO TABS: 10.0000 mg | ORAL_TABLET | Freq: Every day | ORAL | 3 refills | Status: AC

## 2024-05-28 NOTE — Telephone Encounter (Unsigned)
 Copied from CRM #[id number]. Topic: Clinical - Lab/Test Results >> May 28, 2024 10:17 AM Ahlexyia S wrote: Reason for CRM: Pt returning call about lab results. Provided pt with results and pt stated he is okay with either as long as its not an injection. Pt would like a callback to discuss which medication will be prescribed to him.

## 2024-06-19 ENCOUNTER — Ambulatory Visit

## 2024-06-19 DIAGNOSIS — R0609 Other forms of dyspnea: Secondary | ICD-10-CM | POA: Diagnosis not present

## 2024-06-19 DIAGNOSIS — Z952 Presence of prosthetic heart valve: Secondary | ICD-10-CM

## 2024-06-19 LAB — ECHOCARDIOGRAM COMPLETE
AR max vel: 1.44 cm2
AV Area VTI: 1.47 cm2
AV Area mean vel: 1.4 cm2
AV Mean grad: 9 mmHg
AV Peak grad: 16.3 mmHg
Ao pk vel: 2.02 m/s
Area-P 1/2: 3.72 cm2
S' Lateral: 2.4 cm

## 2024-06-21 NOTE — Progress Notes (Signed)
".  rev  "

## 2024-06-21 NOTE — Progress Notes (Signed)
 Patient has been notified directly; all questions, if any, were answered. Patient voiced understanding with spanish interpretor [phone].

## 2024-08-13 ENCOUNTER — Ambulatory Visit: Admitting: Nurse Practitioner

## 2024-09-23 ENCOUNTER — Ambulatory Visit: Admitting: Physician Assistant
